# Patient Record
Sex: Female | Born: 1935 | Race: White | Hispanic: No | Smoking: Never smoker
Health system: Southern US, Community
[De-identification: ages and names within clinical notes are randomized; demographics above are authoritative.]

## PROBLEM LIST (undated history)

## (undated) DIAGNOSIS — R55 Syncope and collapse: Secondary | ICD-10-CM

## (undated) DIAGNOSIS — E039 Hypothyroidism, unspecified: Secondary | ICD-10-CM

## (undated) DIAGNOSIS — I471 Supraventricular tachycardia: Secondary | ICD-10-CM

## (undated) DIAGNOSIS — I351 Nonrheumatic aortic (valve) insufficiency: Secondary | ICD-10-CM

## (undated) DIAGNOSIS — I480 Paroxysmal atrial fibrillation: Secondary | ICD-10-CM

## (undated) DIAGNOSIS — I639 Cerebral infarction, unspecified: Secondary | ICD-10-CM

## (undated) DIAGNOSIS — I350 Nonrheumatic aortic (valve) stenosis: Secondary | ICD-10-CM

## (undated) DIAGNOSIS — I998 Other disorder of circulatory system: Secondary | ICD-10-CM

## (undated) DIAGNOSIS — I63441 Cerebral infarction due to embolism of right cerebellar artery: Secondary | ICD-10-CM

## (undated) DIAGNOSIS — I10 Essential (primary) hypertension: Secondary | ICD-10-CM

## (undated) DIAGNOSIS — I749 Embolism and thrombosis of unspecified artery: Secondary | ICD-10-CM

## (undated) DIAGNOSIS — I4719 Other supraventricular tachycardia: Secondary | ICD-10-CM

## (undated) DIAGNOSIS — I484 Atypical atrial flutter: Secondary | ICD-10-CM

## (undated) DIAGNOSIS — R413 Other amnesia: Secondary | ICD-10-CM

## (undated) HISTORY — DX: Atypical atrial flutter: I48.4

## (undated) HISTORY — PX: TOTAL ABDOMINAL HYSTERECTOMY: SHX209

## (undated) HISTORY — DX: Other supraventricular tachycardia: I47.19

## (undated) HISTORY — PX: TONSILLECTOMY: SUR1361

## (undated) HISTORY — DX: Supraventricular tachycardia: I47.1

## (undated) HISTORY — DX: Cerebral infarction, unspecified: I63.9

## (undated) HISTORY — PX: THYROIDECTOMY: SHX17

## (undated) HISTORY — DX: Essential (primary) hypertension: I10

## (undated) HISTORY — PX: OTHER SURGICAL HISTORY: SHX169

## (undated) HISTORY — PX: APPENDECTOMY: SHX54

## (undated) HISTORY — PX: VITRECTOMY AND CATARACT: SHX6184

## (undated) HISTORY — DX: Other amnesia: R41.3

## (undated) HISTORY — DX: Nonrheumatic aortic (valve) stenosis: I35.0

## (undated) HISTORY — PX: BREAST LUMPECTOMY: SHX2

## (undated) HISTORY — DX: Hypothyroidism, unspecified: E03.9

## (undated) HISTORY — DX: Embolism and thrombosis of unspecified artery: I74.9

## (undated) HISTORY — DX: Syncope and collapse: R55

## (undated) HISTORY — DX: Nonrheumatic aortic (valve) insufficiency: I35.1

## (undated) HISTORY — DX: Paroxysmal atrial fibrillation: I48.0

---

## 2006-11-09 ENCOUNTER — Ambulatory Visit: Payer: Self-pay | Admitting: Cardiology

## 2006-11-24 ENCOUNTER — Ambulatory Visit: Payer: Self-pay | Admitting: Cardiology

## 2007-05-10 ENCOUNTER — Ambulatory Visit: Payer: Self-pay | Admitting: Cardiology

## 2007-06-16 ENCOUNTER — Encounter: Payer: Self-pay | Admitting: Cardiology

## 2007-12-15 ENCOUNTER — Ambulatory Visit: Payer: Self-pay | Admitting: Cardiology

## 2008-07-03 ENCOUNTER — Encounter: Payer: Self-pay | Admitting: Cardiology

## 2008-11-09 ENCOUNTER — Ambulatory Visit: Payer: Self-pay | Admitting: Cardiology

## 2009-05-19 DIAGNOSIS — I471 Supraventricular tachycardia, unspecified: Secondary | ICD-10-CM | POA: Insufficient documentation

## 2009-05-19 DIAGNOSIS — I359 Nonrheumatic aortic valve disorder, unspecified: Secondary | ICD-10-CM

## 2009-07-09 ENCOUNTER — Telehealth (INDEPENDENT_AMBULATORY_CARE_PROVIDER_SITE_OTHER): Payer: Self-pay | Admitting: *Deleted

## 2009-08-15 ENCOUNTER — Encounter: Payer: Self-pay | Admitting: Cardiology

## 2009-09-05 ENCOUNTER — Telehealth (INDEPENDENT_AMBULATORY_CARE_PROVIDER_SITE_OTHER): Payer: Self-pay | Admitting: *Deleted

## 2009-09-11 ENCOUNTER — Encounter: Payer: Self-pay | Admitting: Cardiology

## 2009-09-11 DIAGNOSIS — R0602 Shortness of breath: Secondary | ICD-10-CM

## 2009-09-13 ENCOUNTER — Encounter: Payer: Self-pay | Admitting: Cardiology

## 2009-09-13 ENCOUNTER — Ambulatory Visit: Payer: Self-pay | Admitting: Cardiology

## 2009-09-19 ENCOUNTER — Ambulatory Visit: Payer: Self-pay | Admitting: Cardiology

## 2010-03-03 ENCOUNTER — Ambulatory Visit: Payer: Self-pay | Admitting: Cardiology

## 2010-09-02 NOTE — Miscellaneous (Signed)
Summary: med update  Clinical Lists Changes  Medications: Changed medication from VITAMIN E COMPLEX 1000 UNIT CAPS (VITAMIN E) Take 4 tablet by mouth once a day to VITAMIN E 400 UNIT CAPS (VITAMIN E) Take 1 tablet by mouth once a day

## 2010-09-02 NOTE — Assessment & Plan Note (Signed)
Summary: ROV-REQUESTED EARLIER APPT   Visit Type:  Follow-up Primary Provider:  Dr. Kirstie Peri   History of Present Illness: 77 year old woman presents for routine follow-up. Her palpitations have settled down on higher dose beta blocker therapy. She continues to remain active, swimming 5 times a week, and walking on the treadmill for warmup.  Follow-up echocardiogram on 11 February revealed an LVEF of 60%, with mild AS and AR. This is stable, and we reviewed the results.  Recent labs obtained by Dr. Sherryll Burger show a BUN of 15, creatinine 0.7, total cholesterol 215, HDL 54, LDL 130, triglycerides 154, potassium 4.0, TSH 2.3, hemoglobin 15.3.   Preventive Screening-Counseling & Management  Alcohol-Tobacco     Smoking Status: never  Current Medications (verified): 1)  Metoprolol Succinate 25 Mg Xr24h-Tab (Metoprolol Succinate) .... Take One & One-Half  Tablet By Mouth Daily 2)  Levoxyl 100 Mcg Tabs (Levothyroxine Sodium) .... Take 1 Tablet By Mouth Once A Day 3)  Triamterene-Hctz 37.5-25 Mg Tabs (Triamterene-Hctz) .... Take 1 Tablet By Mouth Once A Day 4)  Red Yeast Rice 600 Mg Tabs (Red Yeast Rice Extract) .... Take 1 Tablet By Mouth Two Times A Day 5)  Aspirin 81 Mg Tbec (Aspirin) .... Take One Tablet By Mouth Daily 6)  Multivitamins  Tabs (Multiple Vitamin) .... Take 1 Tablet By Mouth Once A Day 7)  Calcium 600 Mg Tabs (Calcium) .... Take 1 Tablet By Mouth Two Times A Day 8)  Fish Oil 1000 Mg Caps (Omega-3 Fatty Acids) .... Take 1 Capsule By Mouth Once A Day 9)  Vitamin E Complex 1000 Unit Caps (Vitamin E) .... Take 4 Tablet By Mouth Once A Day 10)  Magnesium 250 Mg Tabs (Magnesium) .... Take 1 Tablet By Mouth Once A Day  Allergies (verified): No Known Drug Allergies  Comments:  Nurse/Medical Assistant: The patient's medications and allergies were reviewed with the patient and were updated in the Medication and Allergy Lists. List reviewed.   Past History:  Past Medical  History: Last updated: 09/18/2009 Aortic Insufficiency - mild Aortic Stenosis - mild SVT - paroxysmal  Social History: Last updated: 09/18/2009 Retired  Widowed  Tobacco Use - No Alcohol Use - no Regular Exercise - yes  Review of Systems  The patient denies anorexia, fever, weight loss, chest pain, syncope, dyspnea on exertion, peripheral edema, headaches, melena, and hematochezia.         She has had some mild arthritic knee pain. Otherwise reviewed and negative.  Vital Signs:  Patient profile:   77 year old female Height:      64 inches Weight:      148 pounds Pulse rate:   69 / minute BP sitting:   123 / 89  (left arm) Cuff size:   regular  Vitals Entered By: Carlye Grippe (September 19, 2009 1:04 PM)   Physical Exam  Additional Exam:  Normally nourished appearing elderly woman, in no acute distress. HEENT: Conjunctiva and lids normal, oropharynx clear. Neck: Supple, no carotid bruits, no thyromegaly. Lungs: Clear to auscultation, nonlabored. Cardiac: Regular rate and rhythm, 2/6 systolic murmur heard best at the base, no loud diastolic murmur, no S3. Abdomen: Soft, nontender. Extremities: No pitting edema, distal pulses 2+. Skin: Warm and dry. Musculoskeletal: No significant kyphosis. Neuropsychiatric: Alert and oriented x3, affect appropriate.   EKG  Procedure date:  09/19/2009  Findings:      Normal sinus rhythm at 62 beats per minutes with occasional premature atrial complexes, left anterior fascicular block,  nonspecific ST changes.  Impression & Recommendations:  Problem # 1:  SVT/ PSVT/ PAT (ICD-427.0)  Symptomatically stable following increase in beta blocker dosing toward the end of last year. Patient will continue to be observant for any progressive palpitations that might warrant additional medication adjustments. I will see her back in 6 months.  Her updated medication list for this problem includes:    Metoprolol Succinate 25 Mg Xr24h-tab  (Metoprolol succinate) .Marland Kitchen... Take one & one-half  tablet by mouth daily    Aspirin 81 Mg Tbec (Aspirin) .Marland Kitchen... Take one tablet by mouth daily  Orders: EKG w/ Interpretation (93000)  Problem # 2:  AORTIC STENOSIS/ INSUFFICIENCY, NON-RHEUMATIC (ICD-424.1)  Patient with mild aortic valve stenosis and regurgitation, symptomatically stable, with no significant progression based on recent echocardiogram. Plan to follow the patient clinically at this point.  Her updated medication list for this problem includes:    Metoprolol Succinate 25 Mg Xr24h-tab (Metoprolol succinate) .Marland Kitchen... Take one & one-half  tablet by mouth daily    Triamterene-hctz 37.5-25 Mg Tabs (Triamterene-hctz) .Marland Kitchen... Take 1 tablet by mouth once a day  Patient Instructions: 1)  Your physician recommends that you continue on your current medications as directed. Please refer to the Current Medication list given to you today. 2)  Follow up in  6 months.

## 2010-09-02 NOTE — Progress Notes (Signed)
Summary: PHONE: ? 2 D ECHO TO BE SCHEDULED  Phone Note Call from Patient Call back at [facility] Phone [phone]   Caller: Patient Details for Reason: 2 D ECHO BEFORE OFFICE VISIT Summary of Call: Christine Pugh called regarding an echo to be set up in March. This is a 1 year fu echo. She is coming in to see you before March so need to know if you want her to have the echo before she sees you or wait until after office visit. Initial call taken by: Zachary George,  September 05, 2009 3:30 PM  Follow-up for Phone Call        Would go ahead and get echo so we can review it at her visit. Follow-up by: Loreli Slot, MD, [facility],  September 06, 2009 8:08 AM

## 2010-09-02 NOTE — Assessment & Plan Note (Signed)
Summary: 6 MO FU PER AUG REMINDER   Visit Type:  Follow-up Primary Provider:  Dr. Kirstie Peri   History of Present Illness: 77 year old woman presents for followup. She is doing well. She reports only a brief 15 second episode of palpitations since I last saw her.  Ms. Marcotte showed me her metals and ribbons from recent participation in the national senior games. She swam the back stroke and freestyle.  No exertional chest pain or unusual breathlessness. She seems to be tolerating the present dose of beta blocker therapy. She swims 5 mornings a week, usually 500 yards per session.  I reviewed her most recent echocardiogram.   Preventive Screening-Counseling & Management  Alcohol-Tobacco     Smoking Status: never  Current Medications (verified): 1)  Metoprolol Succinate 25 Mg Xr24h-Tab (Metoprolol Succinate) .... Take One & One-Half  Tablet By Mouth Daily 2)  Levoxyl 100 Mcg Tabs (Levothyroxine Sodium) .... Take 1 Tablet By Mouth Once A Day 3)  Triamterene-Hctz 37.5-25 Mg Tabs (Triamterene-Hctz) .... Take 1 Tablet By Mouth Once A Day 4)  Red Yeast Rice 600 Mg Tabs (Red Yeast Rice Extract) .... Take 1 Tablet By Mouth Two Times A Day 5)  Aspirin 81 Mg Tbec (Aspirin) .... Take One Tablet By Mouth Daily 6)  Multivitamins  Tabs (Multiple Vitamin) .... Take 1 Tablet By Mouth Once A Day 7)  Calcium 600 Mg Tabs (Calcium) .... Take 1 Tablet By Mouth Two Times A Day 8)  Fish Oil 1000 Mg Caps (Omega-3 Fatty Acids) .... Take 1 Capsule By Mouth Once A Day 9)  Vitamin E 400 Unit Caps (Vitamin E) .... Take 1 Tablet By Mouth Once A Day 10)  Magnesium 500 Mg Tabs (Magnesium) .... Take 1 Tablet By Mouth Once A Day  Allergies (verified): No Known Drug Allergies  Comments:  Nurse/Medical Assistant: The patient's medication list and allergies were reviewed with the patient and were updated in the Medication and Allergy Lists.  Past History:  Past Medical History: Last updated:  09/18/2009 Aortic Insufficiency - mild Aortic Stenosis - mild SVT - paroxysmal  Social History: Last updated: 09/18/2009 Retired  Widowed  Tobacco Use - No Alcohol Use - no Regular Exercise - yes  Review of Systems  The patient denies anorexia, fever, chest pain, syncope, dyspnea on exertion, peripheral edema, melena, and hematochezia.         Otherwise reviewed and negative.  Vital Signs:  Patient profile:   77 year old female Height:      64 inches Weight:      148 pounds BMI:     25.50 Pulse rate:   69 / minute BP sitting:   110 / 71  (left arm) Cuff size:   regular  Vitals Entered By: Carlye Grippe (March 03, 2010 1:13 PM)  Physical Exam  Additional Exam:  Normally nourished appearing elderly woman, in no acute distress. HEENT: Conjunctiva and lids normal, oropharynx clear. Neck: Supple, no carotid bruits, no thyromegaly. Lungs: Clear to auscultation, nonlabored. Cardiac: Regular rate and rhythm, 2/6 systolic murmur heard best at the base, no loud diastolic murmur, no S3. Abdomen: Soft, nontender. Extremities: No pitting edema, distal pulses 2+. Skin: Warm and dry. Musculoskeletal: No significant kyphosis. Neuropsychiatric: Alert and oriented x3, affect appropriate.   Echocardiogram  Procedure date:  09/13/2009  Findings:      LVEF 60% with mild LVH, normal right ventricular function, mild aortic regurgitation, calcified aortic valve with very mild aortic stenosis, mild dilatation of ascending aorta  at 44 mmHg. Aortic valve mean gradient 13 mmHg.  Impression & Recommendations:  Problem # 1:  SVT/ PSVT/ PAT (ICD-427.0)  Symptomatically well controlled on present medical regimen. Anticipate an annual followup, sooner if needed.  Her updated medication list for this problem includes:    Metoprolol Succinate 25 Mg Xr24h-tab (Metoprolol succinate) .Marland Kitchen... Take one & one-half  tablet by mouth daily    Aspirin 81 Mg Tbec (Aspirin) .Marland Kitchen... Take one tablet by  mouth daily  Problem # 2:  AORTIC STENOSIS/ INSUFFICIENCY, NON-RHEUMATIC (ICD-424.1)  Asymptomatic, very mild aortic stenosis, and mild aortic regurgitation.  Her updated medication list for this problem includes:    Metoprolol Succinate 25 Mg Xr24h-tab (Metoprolol succinate) .Marland Kitchen... Take one & one-half  tablet by mouth daily    Triamterene-hctz 37.5-25 Mg Tabs (Triamterene-hctz) .Marland Kitchen... Take 1 tablet by mouth once a day  Patient Instructions: 1)  Your physician wants you to follow-up in: 1 year. You will receive a reminder letter in the mail one-two months in advance. If you don't receive a letter, please call our office to schedule the follow-up appointment. 2)  Your physician recommends that you continue on your current medications as directed. Please refer to the Current Medication list given to you today.

## 2010-09-02 NOTE — Medication Information (Signed)
Summary: RX Folder/ MED LIST & OPERATIONS  RX Folder/ MED LIST & OPERATIONS   Imported By: Dorise Hiss 09/20/2009 10:19:08  _____________________________________________________________________  External Attachment:    Type:   Image     Comment:   External Document

## 2010-09-02 NOTE — Miscellaneous (Signed)
Summary: Orders Update  Clinical Lists Changes  Problems: Added new problem of SHORTNESS OF BREATH (ICD-786.05) Orders: Added new Referral order of 2-D Echocardiogram (2D Echo) - Signed

## 2010-12-09 ENCOUNTER — Other Ambulatory Visit: Payer: Self-pay | Admitting: *Deleted

## 2010-12-09 MED ORDER — METOPROLOL SUCCINATE ER 25 MG PO TB24: ORAL_TABLET | ORAL | Status: DC

## 2010-12-16 NOTE — Assessment & Plan Note (Signed)
[facility]                          [facility] NOTE   NAME:Pugh, Christine BLASCO              MRN:          [id number]  DATE:05/10/2007                            DOB:          January 17, 1922    PRIMARY CARE PHYSICIAN:  Dr. Olga Pugh.   REASON FOR VISIT:  Followup of paroxysmal supraventricular tachycardia.   HISTORY OF PRESENT ILLNESS:  I saw Christine Pugh back in April.  She  reports that she has been doing well.  She still swims actively.  In  fact, recently won a Doctor, hospital at the [employer].  She reports  brief palpitations on beta blocker therapy, but nothing prolonged or  inhibiting her life style.  She has had no dizziness or syncope.  I have  reviewed her medications today.   ALLERGIES:  No known drug allergies.   CURRENT MEDICATIONS:  1. Aspirin 81 mg p.o. daily.  2. Calcium with vitamin D 1200 mg p.o. daily.  3. Red yeast rice daily.  4. Fish oil supplements 1000 mg p.o. twice daily.  5. Triamterene/hydrochlorothiazide 37.5 mg/25 mg p.o. daily.  6. Actonel 35 mg weekly.  7. Levothyroxine 125 mcg p.o. daily.  8. Multivitamin daily.  9. Toprol XL 25 mg p.o. daily.   REVIEW OF SYSTEMS:  As described in the present illness.   PHYSICAL EXAMINATION:  VITAL SIGNS:  Blood pressure 119/73, heart rate  79, weight 157 pounds.  GENERAL:  The patient is comfortable and in no acute distress.  HEENT:  Conjunctivae normal.  Pharynx clear.  NECK:  Supple, no elevated jugular venous pressure, no bruits.  LUNGS:  Clear without labored breathing.  HEART:  A regular rate and rhythm.  A 2/6 systolic murmur heard at the  right base.  The second heart sound is preserved.  No S3 gallop.  EXTREMITIES:  No pitting edema.   IMPRESSION:  1. 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  The patient is relatively well-controlled on the      present regimen.  She did not tolerate Diltiazem, due to  lower      extremity edema, although seems to be doing much better on Toprol      XL.  I will plan to see her back over the next six months, and      perhaps cut this back to a yearly followup thereafter, depending      upon how she is doing.  2. Valvular heart disease, including mild aortic stenosis and mild      aortic regurgitation.  The ejection fraction is 60%-65%.     Christine Sidle, MD  Electronically Signed    SGM/MedQ  DD: 05/10/2007  DT: 05/10/2007  Job #: [id number]   cc:   Christine Pugh, M.D.

## 2010-12-16 NOTE — Assessment & Plan Note (Signed)
[facility]                          [facility] NOTE   NAME:Turley, SHANEQUIA KENDRICK              MRN:          [id number]  DATE:12/15/2007                            DOB:          [birth year]    PRIMARY CARE PHYSICIAN:  Dr. Kirstie Peri.   REASON FOR VISIT:  Followup paroxysmal supraventricular tachycardia and  Ms. Kallstrom was seen back in October of last year.  She has had only an  infrequent occurrence of palpitations, nothing prolonged.  She has been  tolerating her low-dose Toprol XL fairly well.  She continues to swim on  a regular basis and recently competed in the senior games.  She is not  having any chest pain.  Her electrocardiogram today shows sinus rhythm  with a left anterior fascicular block which is old.   ALLERGIES:  No known drug allergies.   MEDICATIONS:  1. Aspirin 81 mg p.o. daily.  2. Calcium with vitamin E 1200 mg p.o. daily.  3. Red yeast rice extract.  4. Omega 3 1000 mg p.o. b.i.d.  5. Triamterine/hydrochlorothiazide 37.5/25 mg p.o. daily.  6. Actonel 35 mg p.o. q. week.  7. Levothyroxine 125 mcg p.o. daily.  8. Multivitamin 1 p.o. daily.  9. Toprol XL 25 mg p.o. daily.   REVIEW OF SYSTEMS:  As described in the history of present illness.  Otherwise negative.   EXAMINATION:  Blood pressure today is 116/71, heart rate 75, weight 155  pounds.  The patient is comfortable and in no acute distress.  Examination neck reveals no elevated jugular venous pressure no loud  bruits.  LUNGS:  Clear on breathing rest.  CARDIAC:  Exam reveals a regular rate rhythm with 2/6 systolic murmur at  the base, second heart sounds preserved.  No gallop.  EXTREMITIES:  No pitting edema.   IMPRESSION/RECOMMENDATIONS:  1. Paroxysmal supraventricular tachycardia, likely due to her      reentrant mechanism based on previous assessment.  The patient is      doing well with medical therapy.  Will not make any specific      changes  unless she manifests progressive symptoms.  In that event,      we would refer for further electrophysiology consultation.  2. Mild aortic stenosis and mild a regurgitation, asymptomatic.  3. Followup will be on annual basis at this point.     Jonelle Sidle, MD  Electronically Signed    SGM/MedQ  DD: 12/15/2007  DT: 12/15/2007  Job #: [id number]   cc:   Kirstie Peri, MD

## 2010-12-16 NOTE — Assessment & Plan Note (Signed)
[facility]                          [facility] NOTE   NAME:Christine Pugh              MRN:          [id number]  DATE:11/09/2008                            DOB:          19-Jun-1922    PRIMARY CARE PHYSICIAN:  Christine Peri, MD   REASON FOR VISIT:  Followup paroxysmal supraventricular tachycardia.   HISTORY OF PRESENT ILLNESS:  Christine Pugh is a very pleasant 77 year old  woman with a history of previously documented supraventricular  tachycardia that is likely due to a reentrant mechanism, well-controlled  on beta-blocker therapy.  I last saw her back in May 2009.  She has had  one episode of rapid palpitations since then that lasted perhaps few  minutes, but otherwise no limiting symptoms.  No dizziness and no  syncope.  She continues to swim regularly and compete in the senior  games.  She plans to try and qualify for the National meet next year in  the 200 meters freestyle.  She is not reporting any exertional chest  pain.  She does have a history of mild aortic stenosis and mild aortic  regurgitation that has been largely asymptomatic.   ALLERGIES:  No known drug allergies.   PRESENT MEDICATIONS:  1. Aspirin 81 mg p.o. daily.  2. Calcium with vitamin B12 100 mg p.o. daily.  3. Red yeast rice extract daily.  4. Fish oil supplements 1000 mg p.o. b.i.d.  5. Triamterene and hydrochlorothiazide 37.5/25 mg p.o. daily.  6. Levothyroxine 125 mcg p.o. daily.  7. Multivitamin daily.  8. Toprol-XL 25 mg p.o. daily.   REVIEW OF SYSTEMS:  As outlined above.  It was negative.   PHYSICAL EXAMINATION:  VITAL SIGNS:  Blood pressure is 139/79, heart  rate is 64, weight is 150 pounds, respirations are 18.  GENERAL:  This is a normally nourished appearing elderly woman in no  acute distress.  NECK:  No elevated jugular venous pressure, no loud bruits.  No  thyromegaly.  LUNGS:  Clear.  CARDIAC:  Regular rate and rhythm with a 2-3/6  systolic murmur at the  base.  Second heart sound is preserved.  There is no S3 gallop or  pericardial rub.  EXTREMITIES:  Exhibit no pitting edema.   IMPRESSION AND RECOMMENDATIONS:  1. Paroxysmal supraventricular tachycardia, likely due to a reentrant      mechanism, and well-controlled on present beta-blocker dosing.  No      specific medication changes at this point.  Christine Pugh is not      reporting any functional limitations with her typical swimming      regimen.  We will continue to follow her on annual basis.  2. Known mild aortic stenosis and regurgitation.  A followup      echocardiogram will be obtained prior to her next visit.  Last      study was in April 2008.     Jonelle Sidle, MD  Electronically Signed    SGM/MedQ  DD: 11/09/2008  DT: 11/10/2008  Job #: [id number]   cc:   Christine Peri, MD

## 2010-12-19 NOTE — Assessment & Plan Note (Signed)
[facility]                          [facility]ICE NOTE   NAME:Christine Pugh, Christine Pugh              MRN:          [id number]  DATE:11/24/2006                            DOB:          02/14/1922    PRIMARY CARE PHYSICIAN:  Dr. Atilano Median.   REASON FOR VISIT:  Followup paroxysmal supraventricular tachycardia.   HISTORY OF PRESENT ILLNESS:  I saw Christine Pugh recently in consultation  having presented with rapid palpitations.  She is a pleasant 77 year old  woman with a history of hypertension, hyperlipidemia, and  hypothyroidism.  She is very active and swims on a regular basis.  She  presented with a narrow complex supraventricular tachycardia that is  suggestive of either an AV nodal re-entrant tachycardia or AV re-entrant  tachycardia that broke with adenosine.  I placed her on long acting  channel blocker therapy at that time and she underwent an echocardiogram  demonstrating normal left ventricular systolic function with an ejection  fraction of 60% - 65%.  She did have mild mitral annular calcification  and mild calcific aortic stenosis with mild aortic regurgitation.  She  had a followup outpatient adenosine Myoview which indicated no clear  evidence of ischemia.  I reviewed these results with her today.  She has  returned to swimming and doing her usual activities without any  problems.  She has had no breakthrough palpitations of any significance  since that time.  Her electrocardiogram today shows sinus rhythm with a  single premature ventricular complex, intervals are normal.  She has a  small R-prime in lead V1 and V2 which was noted previously.  Today we  talked about continuing to remain active, avoiding excess caffeine, and  avoiding over the counter cold preparations with pseudoephedrine.  Otherwise we will plan basic observational medical therapy.   ALLERGIES:  NO KNOWN DRUG ALLERGIES.   PRESENT MEDICATIONS:  1. Aspirin  81 mg p.o. daily.  2. Calcium with vitamin D 1200 mg p.o. daily.  3. Red yeast rice extract.  4. Fish oil supplements 1000 mg p.o. b.i.d.  5. Triamterine hydrochlorothiazide 37.5/25 mg p.o. daily.  6. Actonel 35 mg p.o. q. week.  7. Diltiazem ER 120 mg p.o. daily.  8. Levothyroxine 125 mcg p.o. daily.   REVIEW OF SYSTEMS:  As described in the history of present illness.   EXAMINATION:  Blood pressure is 116/76, heart rate of 73, weight is 159  pounds.  The patient is comfortable and in no acute distress.  NECK:  Reveals no elevated jugular venous pressure, loud bruits.  LUNGS:  Clear without labored breathing.  CARDIAC:  Reveals a regular rate and rhythm without rub, murmur, or  gallop.  EXTREMITIES:  Show no significant pitting edema.   IMPRESSION:  1. Paroxysmal supraventricular tachycardia, likely either AV nodal re-      entrant tachycardia or AV re-entrant tachycardia.  No associated      syncope and followup workup indicates normal thyroid stimulating      hormone, normal left ventricular systolic function, and no marked      ischemia.  I have recommended that she continue her regular  exercise regimen and also continue with Cardizem CD, she is      tolerating this well.  If she has significant breakthrough we can      certainly consider an electrophysiology referral.  She was      comfortable with this.  I will plan to see her back for symptom      review in 6 months.  2. Otherwise continue followup with Dr. Celine Mans.     Jonelle Sidle, MD  Electronically Signed    SGM/MedQ  DD: 11/24/2006  DT: 11/24/2006  Job #: [id number]   cc:   Atilano Median, MD

## 2011-01-13 ENCOUNTER — Encounter: Payer: Self-pay | Admitting: Cardiology

## 2011-02-02 ENCOUNTER — Encounter: Payer: Self-pay | Admitting: Cardiology

## 2011-02-02 ENCOUNTER — Ambulatory Visit (INDEPENDENT_AMBULATORY_CARE_PROVIDER_SITE_OTHER): Payer: Medicare Other | Admitting: Cardiology

## 2011-02-02 VITALS — BP 101/64 | HR 74 | Ht 64.0 in | Wt 146.0 lb

## 2011-02-02 DIAGNOSIS — I471 Supraventricular tachycardia: Secondary | ICD-10-CM

## 2011-02-02 DIAGNOSIS — I359 Nonrheumatic aortic valve disorder, unspecified: Secondary | ICD-10-CM

## 2011-02-02 MED ORDER — METOPROLOL SUCCINATE ER 25 MG PO TB24: ORAL_TABLET | ORAL | Status: DC

## 2011-02-02 NOTE — Assessment & Plan Note (Signed)
Heart murmur is stable on examination, no new symptoms. Echocardiogram from February 2011 was reviewed.

## 2011-02-02 NOTE — Assessment & Plan Note (Signed)
Continue present medical therapy. ECG reviewed.

## 2011-02-02 NOTE — Patient Instructions (Signed)
   Toprol XL refill sent to [facility] for 90 day supply Your physician wants you to follow up in: 6 months.  You will receive a reminder letter in the mail one-two months in advance.  If you don't receive a letter, please call our office to schedule the follow up appointment

## 2011-02-02 NOTE — Progress Notes (Signed)
Clinical Summary Christine Pugh is a 77 y.o.female presenting for followup. She continues to swim regularly, 500 yards this morning. Competes in the senior games as before. She reports no new shortness of breath, exertional chest pain.  I reviewed her last echocardiogram from February 2011 showing mild aortic valve stenosis and mild aortic regurgitation, normal LVEF.  She continues to report brief episodes of palpitations, likely her PSVT. She has had more frequent events in the last few months, particularly in light of the recent passing of her younger sister who was in her mid 75s. This was fairly sudden, and she is still grieving.  We discussed her exercise and diet. She states that she continues to have full lab work with Dr. Sherryll Burger.   No Known Allergies  Current outpatient prescriptions:aspirin 81 MG tablet, Take 81 mg by mouth daily.  , Disp: , Rfl: ;  calcium carbonate (OS-CAL) 600 MG TABS, Take 600 mg by mouth 2 (two) times daily with a meal.  , Disp: , Rfl: ;  levothyroxine (SYNTHROID, LEVOTHROID) 100 MCG tablet, Take 100 mcg by mouth daily.  , Disp: , Rfl: ;  Magnesium 500 MG TABS, Take 1 tablet by mouth daily.  , Disp: , Rfl:  metoprolol succinate (TOPROL XL) 25 MG 24 hr tablet, Take 1&1/2 by mouth daily , Disp: 105 tablet, Rfl: 6;  Multiple Vitamin (MULTIVITAMIN) tablet, Take 1 tablet by mouth daily.  , Disp: , Rfl: ;  Multiple Vitamins-Minerals (PRESERVISION/LUTEIN PO), Take 1 tablet by mouth 2 (two) times daily.  , Disp: , Rfl: ;  Omega-3 Fatty Acids (FISH OIL) 1000 MG CAPS, Take 1 capsule by mouth daily.  , Disp: , Rfl:  Red Yeast Rice 600 MG TABS, Take 1 tablet by mouth 2 (two) times daily.  , Disp: , Rfl: ;  triamterene-hydrochlorothiazide (DYAZIDE) 37.5-25 MG per capsule, Take 1 capsule by mouth daily.  , Disp: , Rfl: ;  vitamin E 400 UNIT capsule, Take 400 Units by mouth daily.  , Disp: , Rfl:   Past Medical History  Diagnosis Date  . Aortic insufficiency     Mild  . Aortic  stenosis     Mild  . PSVT (paroxysmal supraventricular tachycardia)     Social History Ms. Yung reports that she has never smoked. She has never used smokeless tobacco. Ms. Higley reports that she does not drink alcohol.  Review of Systems Otherwise negative.  Physical Examination Filed Vitals:   02/02/11 0935  BP: 101/64  Pulse: 74  Normally nourished appearing elderly woman, in no acute distress.  HEENT: Conjunctiva and lids normal, oropharynx clear.  Neck: Supple, no carotid bruits, no thyromegaly.  Lungs: Clear to auscultation, nonlabored.  Cardiac: Regular rate and rhythm, 2/6 systolic murmur heard best at the base, no loud diastolic murmur, no S3.  Abdomen: Soft, nontender.  Extremities: No pitting edema, distal pulses 2+.  Skin: Warm and dry.  Musculoskeletal: No significant kyphosis.  Neuropsychiatric: Alert and oriented x3, affect appropriate.    ECG Sinus rhythm at 86 with single PAC, left anterior fascicular block.    Problem List and Plan

## 2011-08-06 ENCOUNTER — Ambulatory Visit (INDEPENDENT_AMBULATORY_CARE_PROVIDER_SITE_OTHER): Payer: Medicare Other | Admitting: Cardiology

## 2011-08-06 ENCOUNTER — Encounter: Payer: Self-pay | Admitting: Cardiology

## 2011-08-06 VITALS — BP 127/80 | HR 67 | Ht 64.0 in | Wt 147.0 lb

## 2011-08-06 DIAGNOSIS — I359 Nonrheumatic aortic valve disorder, unspecified: Secondary | ICD-10-CM

## 2011-08-06 DIAGNOSIS — I471 Supraventricular tachycardia: Secondary | ICD-10-CM

## 2011-08-06 MED ORDER — METOPROLOL SUCCINATE ER 25 MG PO TB24: 37.5000 mg | ORAL_TABLET | Freq: Every day | ORAL | Status: DC

## 2011-08-06 NOTE — Assessment & Plan Note (Signed)
Murmur is stable. No change in functional capacity. Followup echocardiogram for her next visit.

## 2011-08-06 NOTE — Assessment & Plan Note (Signed)
Fewer palpitations compared to last visit. Continue same medications and observation for now.

## 2011-08-06 NOTE — Patient Instructions (Signed)
Your physician wants you to follow-up in: 6 months. You will receive a reminder letter in the mail one-two months in advance. If you don't receive a letter, please call our office to schedule the follow-up appointment. Your physician recommends that you continue on your current medications as directed. Please refer to the Current Medication list given to you today. Your physician has requested that you have an echocardiogram. Echocardiography is a painless test that uses sound waves to create images of your heart. It provides your doctor with information about the size and shape of your heart and how well your heart's chambers and valves are working. This procedure takes approximately one hour. There are no restrictions for this procedure. DO ECHO IN 6 MONTHS BEFORE NEXT OFFICE VISIT. 

## 2011-08-06 NOTE — Progress Notes (Signed)
Clinical Summary Christine Pugh is a 78 y.o.female presenting for followup. She was seen in July.  Continues to do well without chest pain or breathlessness. Reports fewer palpitations than at the last visit. She is still swimming regularly at the pool.  Her ECG is reviewed showing sinus rhythm with PACs.  No changes in her medications. She needs a refill on Toprol XL.   No Known Allergies  Medication list reviewed.  Past Medical History  Diagnosis Date  . Aortic insufficiency     Mild  . Aortic stenosis     Mild  . PSVT (paroxysmal supraventricular tachycardia)     Past Surgical History  Procedure Date  . Thyroidectomy   . Total abdominal hysterectomy   . Appendectomy   . Tonsillectomy     Family History  Problem Relation Age of Onset  . Coronary artery disease      Social History Christine Pugh reports that she has never smoked. She has never used smokeless tobacco. Christine Pugh reports that she does not drink alcohol.  Review of Systems Reviewed above. Otherwise negative.  Physical Examination Filed Vitals:   08/06/11 1258  BP: 127/80  Pulse: 67    Normally nourished appearing elderly woman, in no acute distress.  HEENT: Conjunctiva and lids normal, oropharynx clear.  Neck: Supple, no carotid bruits, no thyromegaly.  Lungs: Clear to auscultation, nonlabored.  Cardiac: Regular rate and rhythm with ectopy, 2-3/6 systolic murmur heard best at the base, no loud diastolic murmur, no S3.  Abdomen: Soft, nontender.  Extremities: No pitting edema, distal pulses 2+.    ECG Reviewed in EMR.   Problem List and Plan

## 2011-08-10 DIAGNOSIS — H26499 Other secondary cataract, unspecified eye: Secondary | ICD-10-CM | POA: Diagnosis not present

## 2011-08-10 DIAGNOSIS — H35359 Cystoid macular degeneration, unspecified eye: Secondary | ICD-10-CM | POA: Diagnosis not present

## 2011-08-10 DIAGNOSIS — H353190 Nonexudative age-related macular degeneration, unspecified eye, stage unspecified: Secondary | ICD-10-CM | POA: Diagnosis not present

## 2011-08-10 DIAGNOSIS — H43819 Vitreous degeneration, unspecified eye: Secondary | ICD-10-CM | POA: Diagnosis not present

## 2011-08-24 DIAGNOSIS — Z1231 Encounter for screening mammogram for malignant neoplasm of breast: Secondary | ICD-10-CM | POA: Diagnosis not present

## 2011-09-09 DIAGNOSIS — H35059 Retinal neovascularization, unspecified, unspecified eye: Secondary | ICD-10-CM | POA: Diagnosis not present

## 2011-09-09 DIAGNOSIS — H353290 Exudative age-related macular degeneration, unspecified eye, stage unspecified: Secondary | ICD-10-CM | POA: Diagnosis not present

## 2011-09-21 DIAGNOSIS — L821 Other seborrheic keratosis: Secondary | ICD-10-CM | POA: Diagnosis not present

## 2011-09-21 DIAGNOSIS — D239 Other benign neoplasm of skin, unspecified: Secondary | ICD-10-CM | POA: Diagnosis not present

## 2011-10-13 DIAGNOSIS — E7800 Pure hypercholesterolemia, unspecified: Secondary | ICD-10-CM | POA: Diagnosis not present

## 2011-10-13 DIAGNOSIS — Z79899 Other long term (current) drug therapy: Secondary | ICD-10-CM | POA: Diagnosis not present

## 2011-10-13 DIAGNOSIS — R5381 Other malaise: Secondary | ICD-10-CM | POA: Diagnosis not present

## 2011-10-13 DIAGNOSIS — E559 Vitamin D deficiency, unspecified: Secondary | ICD-10-CM | POA: Diagnosis not present

## 2011-10-13 DIAGNOSIS — I10 Essential (primary) hypertension: Secondary | ICD-10-CM | POA: Diagnosis not present

## 2011-10-19 DIAGNOSIS — Z0000 Encounter for general adult medical examination without abnormal findings: Secondary | ICD-10-CM | POA: Diagnosis not present

## 2011-10-19 DIAGNOSIS — Z1212 Encounter for screening for malignant neoplasm of rectum: Secondary | ICD-10-CM | POA: Diagnosis not present

## 2011-10-21 DIAGNOSIS — H353290 Exudative age-related macular degeneration, unspecified eye, stage unspecified: Secondary | ICD-10-CM | POA: Diagnosis not present

## 2011-10-21 DIAGNOSIS — H35059 Retinal neovascularization, unspecified, unspecified eye: Secondary | ICD-10-CM | POA: Diagnosis not present

## 2011-11-25 DIAGNOSIS — L0201 Cutaneous abscess of face: Secondary | ICD-10-CM | POA: Diagnosis not present

## 2011-12-02 DIAGNOSIS — H35059 Retinal neovascularization, unspecified, unspecified eye: Secondary | ICD-10-CM | POA: Diagnosis not present

## 2011-12-02 DIAGNOSIS — H353290 Exudative age-related macular degeneration, unspecified eye, stage unspecified: Secondary | ICD-10-CM | POA: Diagnosis not present

## 2012-01-25 DIAGNOSIS — H35059 Retinal neovascularization, unspecified, unspecified eye: Secondary | ICD-10-CM | POA: Diagnosis not present

## 2012-01-25 DIAGNOSIS — H353290 Exudative age-related macular degeneration, unspecified eye, stage unspecified: Secondary | ICD-10-CM | POA: Diagnosis not present

## 2012-02-02 DIAGNOSIS — L03119 Cellulitis of unspecified part of limb: Secondary | ICD-10-CM | POA: Diagnosis not present

## 2012-02-02 DIAGNOSIS — L02419 Cutaneous abscess of limb, unspecified: Secondary | ICD-10-CM | POA: Diagnosis not present

## 2012-02-05 DIAGNOSIS — L02419 Cutaneous abscess of limb, unspecified: Secondary | ICD-10-CM | POA: Diagnosis not present

## 2012-02-05 DIAGNOSIS — R609 Edema, unspecified: Secondary | ICD-10-CM | POA: Diagnosis not present

## 2012-02-08 DIAGNOSIS — L02419 Cutaneous abscess of limb, unspecified: Secondary | ICD-10-CM | POA: Diagnosis not present

## 2012-02-08 DIAGNOSIS — L03119 Cellulitis of unspecified part of limb: Secondary | ICD-10-CM | POA: Diagnosis not present

## 2012-02-11 DIAGNOSIS — L97809 Non-pressure chronic ulcer of other part of unspecified lower leg with unspecified severity: Secondary | ICD-10-CM | POA: Diagnosis not present

## 2012-02-11 DIAGNOSIS — Z7982 Long term (current) use of aspirin: Secondary | ICD-10-CM | POA: Diagnosis not present

## 2012-02-11 DIAGNOSIS — Z79899 Other long term (current) drug therapy: Secondary | ICD-10-CM | POA: Diagnosis not present

## 2012-02-11 DIAGNOSIS — I872 Venous insufficiency (chronic) (peripheral): Secondary | ICD-10-CM | POA: Diagnosis not present

## 2012-02-11 DIAGNOSIS — S8010XA Contusion of unspecified lower leg, initial encounter: Secondary | ICD-10-CM | POA: Diagnosis not present

## 2012-02-12 DIAGNOSIS — S81809A Unspecified open wound, unspecified lower leg, initial encounter: Secondary | ICD-10-CM | POA: Diagnosis not present

## 2012-02-12 DIAGNOSIS — M79609 Pain in unspecified limb: Secondary | ICD-10-CM | POA: Diagnosis not present

## 2012-02-12 DIAGNOSIS — I10 Essential (primary) hypertension: Secondary | ICD-10-CM | POA: Diagnosis not present

## 2012-02-12 DIAGNOSIS — M7989 Other specified soft tissue disorders: Secondary | ICD-10-CM | POA: Diagnosis not present

## 2012-02-15 DIAGNOSIS — L97809 Non-pressure chronic ulcer of other part of unspecified lower leg with unspecified severity: Secondary | ICD-10-CM | POA: Diagnosis not present

## 2012-02-15 DIAGNOSIS — I872 Venous insufficiency (chronic) (peripheral): Secondary | ICD-10-CM | POA: Diagnosis not present

## 2012-02-15 DIAGNOSIS — Z7982 Long term (current) use of aspirin: Secondary | ICD-10-CM | POA: Diagnosis not present

## 2012-02-15 DIAGNOSIS — S8010XA Contusion of unspecified lower leg, initial encounter: Secondary | ICD-10-CM | POA: Diagnosis not present

## 2012-02-15 DIAGNOSIS — Z79899 Other long term (current) drug therapy: Secondary | ICD-10-CM | POA: Diagnosis not present

## 2012-02-25 ENCOUNTER — Other Ambulatory Visit: Payer: Self-pay | Admitting: Cardiology

## 2012-02-25 ENCOUNTER — Telehealth: Payer: Self-pay | Admitting: Cardiology

## 2012-02-25 DIAGNOSIS — I359 Nonrheumatic aortic valve disorder, unspecified: Secondary | ICD-10-CM

## 2012-02-25 DIAGNOSIS — I471 Supraventricular tachycardia: Secondary | ICD-10-CM

## 2012-02-25 NOTE — Telephone Encounter (Signed)
Patient call concerned about Rapid heart beats that has made her dizzy. Can tell that her heart is skipping beats.  She is currently in [place] until 7/28 night the will be going back out of town 8/18.  I explained that [facility] first available is 8/20 with [place].  She wanted to know if she could be seen in [place] sooner? She would like her stress text completed and see Diona Browner before then.

## 2012-02-25 NOTE — Telephone Encounter (Signed)
Patient informed that no available appointments for [facility] b/4 she leaves for vacation again. Patient said she would see Gene. Nurse informed her that her echo could be scheduled before that time.

## 2012-02-29 DIAGNOSIS — I83219 Varicose veins of right lower extremity with both ulcer of unspecified site and inflammation: Secondary | ICD-10-CM | POA: Diagnosis not present

## 2012-02-29 DIAGNOSIS — L97809 Non-pressure chronic ulcer of other part of unspecified lower leg with unspecified severity: Secondary | ICD-10-CM | POA: Diagnosis not present

## 2012-02-29 DIAGNOSIS — S8010XA Contusion of unspecified lower leg, initial encounter: Secondary | ICD-10-CM | POA: Diagnosis not present

## 2012-02-29 DIAGNOSIS — Z79899 Other long term (current) drug therapy: Secondary | ICD-10-CM | POA: Diagnosis not present

## 2012-02-29 DIAGNOSIS — L97919 Non-pressure chronic ulcer of unspecified part of right lower leg with unspecified severity: Secondary | ICD-10-CM | POA: Diagnosis not present

## 2012-02-29 DIAGNOSIS — I872 Venous insufficiency (chronic) (peripheral): Secondary | ICD-10-CM | POA: Diagnosis not present

## 2012-02-29 DIAGNOSIS — Z7982 Long term (current) use of aspirin: Secondary | ICD-10-CM | POA: Diagnosis not present

## 2012-03-08 DIAGNOSIS — IMO0001 Reserved for inherently not codable concepts without codable children: Secondary | ICD-10-CM | POA: Diagnosis not present

## 2012-03-08 DIAGNOSIS — I83219 Varicose veins of right lower extremity with both ulcer of unspecified site and inflammation: Secondary | ICD-10-CM | POA: Diagnosis not present

## 2012-03-08 DIAGNOSIS — I872 Venous insufficiency (chronic) (peripheral): Secondary | ICD-10-CM | POA: Diagnosis not present

## 2012-03-08 DIAGNOSIS — L97809 Non-pressure chronic ulcer of other part of unspecified lower leg with unspecified severity: Secondary | ICD-10-CM | POA: Diagnosis not present

## 2012-03-10 ENCOUNTER — Other Ambulatory Visit: Payer: Self-pay

## 2012-03-10 ENCOUNTER — Other Ambulatory Visit (INDEPENDENT_AMBULATORY_CARE_PROVIDER_SITE_OTHER): Payer: Medicare Other

## 2012-03-10 DIAGNOSIS — I471 Supraventricular tachycardia: Secondary | ICD-10-CM | POA: Diagnosis not present

## 2012-03-10 DIAGNOSIS — I359 Nonrheumatic aortic valve disorder, unspecified: Secondary | ICD-10-CM

## 2012-03-15 DIAGNOSIS — S8010XA Contusion of unspecified lower leg, initial encounter: Secondary | ICD-10-CM | POA: Diagnosis not present

## 2012-03-15 DIAGNOSIS — I872 Venous insufficiency (chronic) (peripheral): Secondary | ICD-10-CM | POA: Diagnosis not present

## 2012-03-15 DIAGNOSIS — IMO0001 Reserved for inherently not codable concepts without codable children: Secondary | ICD-10-CM | POA: Diagnosis not present

## 2012-03-15 DIAGNOSIS — L97809 Non-pressure chronic ulcer of other part of unspecified lower leg with unspecified severity: Secondary | ICD-10-CM | POA: Diagnosis not present

## 2012-03-15 DIAGNOSIS — L97909 Non-pressure chronic ulcer of unspecified part of unspecified lower leg with unspecified severity: Secondary | ICD-10-CM | POA: Diagnosis not present

## 2012-03-15 DIAGNOSIS — I83009 Varicose veins of unspecified lower extremity with ulcer of unspecified site: Secondary | ICD-10-CM | POA: Diagnosis not present

## 2012-03-16 ENCOUNTER — Encounter: Payer: Self-pay | Admitting: Physician Assistant

## 2012-03-16 ENCOUNTER — Ambulatory Visit (INDEPENDENT_AMBULATORY_CARE_PROVIDER_SITE_OTHER): Payer: Medicare Other | Admitting: Physician Assistant

## 2012-03-16 VITALS — BP 120/68 | HR 69 | Ht 65.0 in | Wt 145.4 lb

## 2012-03-16 DIAGNOSIS — I471 Supraventricular tachycardia, unspecified: Secondary | ICD-10-CM | POA: Diagnosis not present

## 2012-03-16 DIAGNOSIS — R002 Palpitations: Secondary | ICD-10-CM

## 2012-03-16 DIAGNOSIS — I359 Nonrheumatic aortic valve disorder, unspecified: Secondary | ICD-10-CM

## 2012-03-16 DIAGNOSIS — I498 Other specified cardiac arrhythmias: Secondary | ICD-10-CM

## 2012-03-16 MED ORDER — METOPROLOL SUCCINATE ER 25 MG PO TB24: 25.0000 mg | ORAL_TABLET | Freq: Two times a day (BID) | ORAL | Status: DC

## 2012-03-16 NOTE — Assessment & Plan Note (Signed)
Will increase Toprol to 25 twice a day, for better suppression of recent breakthrough symptomatic tachycardia palpitations. We'll reassess clinical status in one month. If symptoms worsen, would consider further evaluation with an event monitor.

## 2012-03-16 NOTE — Assessment & Plan Note (Addendum)
Assessed as moderate, by recent followup echo; normal LVF. Patient remains asymptomatic. Recommend repeat study in 6 months. 

## 2012-03-16 NOTE — Progress Notes (Signed)
Primary Cardiologist: Simona Huh, MD   HPI: Patient seen today as an add-on for evaluation of symptomatic palpitations.   Recent followup 2-D echo, reviewed by Dr. Diona Browner, indicated NL LVEF; now with overall overall moderate aortic stenosis.  She presents reporting somewhat increased frequency of palpitations over the last few months. These are unpredictable in onset, and last only a few seconds to perhaps 1 minute at most in duration. Nevertheless, she does not tolerate these episodes very well. She is "startled" by them, with some mild weakness and headache that follows. She denies any near-syncope/syncope, CP, or significant dyspnea.  Patient otherwise continues to do quite well. Despite a recent mechanical fall (no syncope), she continues to swim 5 times weekly. She is also preparing for white water rafting on the [place] for 4 days.  12-lead EKG today, reviewed by me, reveals NSR with isolated PVC  No Known Allergies  Current Outpatient Prescriptions  Medication Sig Dispense Refill  . aspirin 81 MG tablet Take 81 mg by mouth daily.        . calcium carbonate (OS-CAL) 600 MG TABS Take 600 mg by mouth daily.       Marland Kitchen levothyroxine (SYNTHROID, LEVOTHROID) 100 MCG tablet Take 100 mcg by mouth daily.        . Magnesium 500 MG TABS Take 1 tablet by mouth daily.        . metoprolol succinate (TOPROL XL) 25 MG 24 hr tablet Take 1.5 tablets (37.5 mg total) by mouth daily.  135 tablet  3  . Multiple Vitamin (MULTIVITAMIN) tablet Take 1 tablet by mouth daily.        . Multiple Vitamins-Minerals (PRESERVISION/LUTEIN PO) Take 1 tablet by mouth 2 (two) times daily.        Marland Kitchen ofloxacin (OCUFLOX) 0.3 % ophthalmic solution Place 3 drops into the left eye 3 (three) times daily. Take 3 drops to left eye for 3 days after injection.      . Omega-3 Fatty Acids (FISH OIL) 1000 MG CAPS Take 1 capsule by mouth daily.        . Red Yeast Rice 600 MG TABS Take 1 tablet by mouth 2 (two) times daily.         Marland Kitchen triamterene-hydrochlorothiazide (DYAZIDE) 37.5-25 MG per capsule Take 1 capsule by mouth daily.        . vitamin E 400 UNIT capsule Take 400 Units by mouth daily.          Past Medical History  Diagnosis Date  . Aortic insufficiency     Mild  . Aortic stenosis     Mild  . PSVT (paroxysmal supraventricular tachycardia)     Past Surgical History  Procedure Date  . Thyroidectomy   . Total abdominal hysterectomy   . Appendectomy   . Tonsillectomy     History   Social History  . Marital Status: Widowed    Spouse Name: N/A    Number of Children: N/A  . Years of Education: N/A   Occupational History  . Retired    Social History Main Topics  . Smoking status: Never Smoker   . Smokeless tobacco: Never Used  . Alcohol Use: No  . Drug Use: No  . Sexually Active: Not on file   Other Topics Concern  . Not on file   Social History Narrative  . No narrative on file   Social History Narrative  . No narrative on file    Problem Relation Age of Onset  .  Coronary artery disease      ROS: no nausea, vomiting; no fever, chills; no melena, hematochezia; no claudication  PHYSICAL EXAM: BP 120/68  Pulse 69  Ht 5\' 5"  (1.651 m)  Wt 145 lb 6.4 oz (65.953 kg)  BMI 24.20 kg/m2 GENERAL: 78 year-old female; NAD HEENT: NCAT, PERRLA, EOMI; sclera clear; no xanthelasma NECK: palpable bilateral carotid pulses, no bruits; no JVD; no TM LUNGS: CTA bilaterally CARDIAC: RRR ; harsh, 3/6 crescendo decrescendo murmur, crisp S2; no rubs or gallops ABDOMEN: soft, non-tender; intact BS EXTREMETIES: bandaged RLE no significant peripheral edema SKIN: warm/dry; no obvious rash/lesions MUSCULOSKELETAL: no joint deformity NEURO: no focal deficit; NL affect   EKG: reviewed and available in Electronic Records   ASSESSMENT & PLAN:  SVT/ PSVT/ PAT Will increase Toprol to 25 twice a day, for better suppression of recent breakthrough symptomatic tachycardia palpitations. We'll reassess  clinical status in one month. If symptoms worsen, would consider further evaluation with an event monitor.  AORTIC STENOSIS/ INSUFFICIENCY, NON-RHEUMATIC Assessed as moderate, by recent followup echo; normal LVF. Patient remains asymptomatic. Recommend repeat study in 6 months.    Gene Lutie Pickler, PAC

## 2012-03-16 NOTE — Addendum Note (Signed)
Addended by: Eustace Moore on: 03/16/2012 02:23 PM   Modules accepted: Orders, Level of Service

## 2012-03-16 NOTE — Patient Instructions (Addendum)
Your physician recommends that you schedule a follow-up appointment in: 1 month. Your physician has recommended you make the following change in your medication: Increase Toprol Xl 25 mg to one by mouth twice daily. All other medications are still the same. Your new prescription has been sent to your pharmacy. Your physician recommends that you return for lab work in: today for BMET and Magnesium level at [facility]

## 2012-03-18 ENCOUNTER — Telehealth: Payer: Self-pay | Admitting: *Deleted

## 2012-03-18 NOTE — Telephone Encounter (Signed)
Message copied by Eustace Moore on Fri Mar 18, 2012 11:30 AM ------      Message from: Rande Brunt      Created: Fri Mar 18, 2012  8:19 AM       NL Mg level

## 2012-03-18 NOTE — Telephone Encounter (Signed)
Patient informed via message machine voicemail.

## 2012-03-22 DIAGNOSIS — I872 Venous insufficiency (chronic) (peripheral): Secondary | ICD-10-CM | POA: Diagnosis not present

## 2012-03-22 DIAGNOSIS — IMO0001 Reserved for inherently not codable concepts without codable children: Secondary | ICD-10-CM | POA: Diagnosis not present

## 2012-03-22 DIAGNOSIS — L97809 Non-pressure chronic ulcer of other part of unspecified lower leg with unspecified severity: Secondary | ICD-10-CM | POA: Diagnosis not present

## 2012-03-22 DIAGNOSIS — L97909 Non-pressure chronic ulcer of unspecified part of unspecified lower leg with unspecified severity: Secondary | ICD-10-CM | POA: Diagnosis not present

## 2012-03-31 DIAGNOSIS — Z7982 Long term (current) use of aspirin: Secondary | ICD-10-CM | POA: Diagnosis not present

## 2012-03-31 DIAGNOSIS — L259 Unspecified contact dermatitis, unspecified cause: Secondary | ICD-10-CM | POA: Diagnosis not present

## 2012-03-31 DIAGNOSIS — Z79899 Other long term (current) drug therapy: Secondary | ICD-10-CM | POA: Diagnosis not present

## 2012-04-06 DIAGNOSIS — H353290 Exudative age-related macular degeneration, unspecified eye, stage unspecified: Secondary | ICD-10-CM | POA: Diagnosis not present

## 2012-04-06 DIAGNOSIS — H35059 Retinal neovascularization, unspecified, unspecified eye: Secondary | ICD-10-CM | POA: Diagnosis not present

## 2012-04-12 ENCOUNTER — Encounter: Payer: Self-pay | Admitting: Cardiology

## 2012-04-12 ENCOUNTER — Ambulatory Visit (INDEPENDENT_AMBULATORY_CARE_PROVIDER_SITE_OTHER): Payer: Medicare Other | Admitting: Cardiology

## 2012-04-12 VITALS — BP 128/77 | HR 66 | Ht 65.0 in | Wt 146.8 lb

## 2012-04-12 DIAGNOSIS — I471 Supraventricular tachycardia, unspecified: Secondary | ICD-10-CM

## 2012-04-12 DIAGNOSIS — I359 Nonrheumatic aortic valve disorder, unspecified: Secondary | ICD-10-CM | POA: Diagnosis not present

## 2012-04-12 NOTE — Patient Instructions (Addendum)

## 2012-04-12 NOTE — Progress Notes (Signed)
   Clinical Summary Ms. Mccahill is a 78 y.o.female presenting for followup. She was seen recently by Mr. Serpe in August secondary to increasing sense of palpitations. Beta blocker dose was advanced, and fortunately she has tolerated this well with significant improvement in symptoms. She is also able to remain active, swimming regularly. Recently traveled to the [facility] for a grafting and camping trip.   No Known Allergies  Current Outpatient Prescriptions  Medication Sig Dispense Refill  . aspirin 81 MG tablet Take 81 mg by mouth daily.        . calcium carbonate (OS-CAL) 600 MG TABS Take 600 mg by mouth daily.       Marland Kitchen levothyroxine (SYNTHROID, LEVOTHROID) 100 MCG tablet Take 100 mcg by mouth daily.        . Magnesium 500 MG TABS Take 1 tablet by mouth daily.        . metoprolol succinate (TOPROL-XL) 25 MG 24 hr tablet Take 1 tablet (25 mg total) by mouth 2 (two) times daily.  180 tablet  3  . Multiple Vitamin (MULTIVITAMIN) tablet Take 1 tablet by mouth daily.        . Multiple Vitamins-Minerals (PRESERVISION/LUTEIN PO) Take 1 tablet by mouth 2 (two) times daily.        Marland Kitchen ofloxacin (OCUFLOX) 0.3 % ophthalmic solution Place 3 drops into the left eye 3 (three) times daily. Take 3 drops to left eye for 3 days after injection.      . Omega-3 Fatty Acids (FISH OIL) 1000 MG CAPS Take 1 capsule by mouth daily.        . Red Yeast Rice 600 MG TABS Take 1 tablet by mouth 2 (two) times daily.        Marland Kitchen triamterene-hydrochlorothiazide (DYAZIDE) 37.5-25 MG per capsule Take 1 capsule by mouth daily.        . vitamin E 400 UNIT capsule Take 400 Units by mouth daily.          Past Medical History  Diagnosis Date  . Aortic insufficiency     Mild  . Aortic stenosis     Mild  . PSVT (paroxysmal supraventricular tachycardia)     Social History Ms. Sanagustin reports that she has never smoked. She has never used smokeless tobacco. Ms. Mezey reports that she does not drink alcohol.  Review of  Systems No syncope or dizziness. Stable appetite. Otherwise negative.  Physical Examination Filed Vitals:   04/12/12 1421  BP: 128/77  Pulse: 66    Normally nourished appearing elderly woman, in no acute distress.  HEENT: Conjunctiva and lids normal, oropharynx clear.  Neck: Supple, no carotid bruits, no thyromegaly.  Lungs: Clear to auscultation, nonlabored.  Cardiac: Regular rate and rhythm with ectopy, 2-3/6 systolic murmur heard best at the base, no loud diastolic murmur, no S3.  Abdomen: Soft, nontender.  Extremities: No pitting edema, distal pulses 2+.    Problem List and Plan   SVT/ PSVT/ PAT Symptomatically improved following increase in Toprol-XL. No changes made today. Continue observation.  AORTIC STENOSIS/ INSUFFICIENCY, NON-RHEUMATIC Assessed as moderate, by recent followup echo; normal LVF. Patient remains asymptomatic. Recommend repeat study in 6 months.    Jonelle Sidle, M.D., F.A.C.C.

## 2012-04-12 NOTE — Assessment & Plan Note (Signed)
Symptomatically improved following increase in Toprol-XL. No changes made today. Continue observation.

## 2012-04-12 NOTE — Assessment & Plan Note (Signed)
Assessed as moderate, by recent followup echo; normal LVF. Patient remains asymptomatic. Recommend repeat study in 6 months.

## 2012-04-19 DIAGNOSIS — Z23 Encounter for immunization: Secondary | ICD-10-CM | POA: Diagnosis not present

## 2012-06-06 DIAGNOSIS — H353290 Exudative age-related macular degeneration, unspecified eye, stage unspecified: Secondary | ICD-10-CM | POA: Diagnosis not present

## 2012-06-06 DIAGNOSIS — H35359 Cystoid macular degeneration, unspecified eye: Secondary | ICD-10-CM | POA: Diagnosis not present

## 2012-06-06 DIAGNOSIS — H35059 Retinal neovascularization, unspecified, unspecified eye: Secondary | ICD-10-CM | POA: Diagnosis not present

## 2012-08-10 DIAGNOSIS — H35059 Retinal neovascularization, unspecified, unspecified eye: Secondary | ICD-10-CM | POA: Diagnosis not present

## 2012-08-10 DIAGNOSIS — H353290 Exudative age-related macular degeneration, unspecified eye, stage unspecified: Secondary | ICD-10-CM | POA: Diagnosis not present

## 2012-08-11 DIAGNOSIS — I10 Essential (primary) hypertension: Secondary | ICD-10-CM | POA: Diagnosis not present

## 2012-08-11 DIAGNOSIS — E039 Hypothyroidism, unspecified: Secondary | ICD-10-CM | POA: Diagnosis not present

## 2012-08-24 DIAGNOSIS — E039 Hypothyroidism, unspecified: Secondary | ICD-10-CM | POA: Diagnosis not present

## 2012-08-24 DIAGNOSIS — Z1231 Encounter for screening mammogram for malignant neoplasm of breast: Secondary | ICD-10-CM | POA: Diagnosis not present

## 2012-08-24 DIAGNOSIS — M1990 Unspecified osteoarthritis, unspecified site: Secondary | ICD-10-CM | POA: Diagnosis not present

## 2012-08-24 DIAGNOSIS — R002 Palpitations: Secondary | ICD-10-CM | POA: Diagnosis not present

## 2012-08-24 DIAGNOSIS — I10 Essential (primary) hypertension: Secondary | ICD-10-CM | POA: Diagnosis not present

## 2012-08-24 DIAGNOSIS — E782 Mixed hyperlipidemia: Secondary | ICD-10-CM | POA: Diagnosis not present

## 2012-09-07 DIAGNOSIS — L259 Unspecified contact dermatitis, unspecified cause: Secondary | ICD-10-CM | POA: Diagnosis not present

## 2012-09-08 ENCOUNTER — Ambulatory Visit (INDEPENDENT_AMBULATORY_CARE_PROVIDER_SITE_OTHER): Payer: Medicare Other | Admitting: Cardiology

## 2012-09-08 ENCOUNTER — Encounter: Payer: Self-pay | Admitting: Cardiology

## 2012-09-08 VITALS — BP 129/78 | HR 73 | Ht 65.0 in | Wt 150.0 lb

## 2012-09-08 DIAGNOSIS — I471 Supraventricular tachycardia, unspecified: Secondary | ICD-10-CM

## 2012-09-08 DIAGNOSIS — I359 Nonrheumatic aortic valve disorder, unspecified: Secondary | ICD-10-CM

## 2012-09-08 NOTE — Patient Instructions (Signed)
   Echo  Office will contact with results Your physician wants you to follow up in: 6 months.  You will receive a reminder letter in the mail one-two months in advance.  If you don't receive a letter, please call our office to schedule the follow up appointment

## 2012-09-08 NOTE — Progress Notes (Signed)
Clinical Summary Christine Pugh is an 79 y.o.female presenting for followup. She was seen in September 2013. If she continues to do remarkably well. Exercises at least 4 days a week, swims 500 yards about 30 minutes.  Echocardiogram in August 2013 showed LVEF 55-60%, grade 2 diastolic dysfunction, moderate to severe AS, mild AR, dilated aortic root. I discussed with her the fact that the aortic valve stenosis has progressed over time.  ECG today shows sinus rhythm with small R prime in V1, leftward axis. She does have brief, occasional palpitations, nothing prolonged. No cardiac hospitalizations.  No Known Allergies  Current Outpatient Prescriptions  Medication Sig Dispense Refill  . aspirin 81 MG tablet Take 81 mg by mouth daily.        . calcium carbonate (OS-CAL) 600 MG TABS Take 600 mg by mouth daily.       Marland Kitchen levothyroxine (SYNTHROID, LEVOTHROID) 100 MCG tablet Take 100 mcg by mouth daily.        . Magnesium 500 MG TABS Take 1 tablet by mouth daily.        . metoprolol succinate (TOPROL-XL) 25 MG 24 hr tablet Take 1 tablet (25 mg total) by mouth 2 (two) times daily.  180 tablet  3  . Multiple Vitamin (MULTIVITAMIN) tablet Take 1 tablet by mouth daily.        . Multiple Vitamins-Minerals (PRESERVISION/LUTEIN PO) Take 1 tablet by mouth 2 (two) times daily.        Marland Kitchen ofloxacin (OCUFLOX) 0.3 % ophthalmic solution Place 3 drops into the left eye 3 (three) times daily. Take 3 drops to left eye for 3 days after injection.      . Omega-3 Fatty Acids (FISH OIL) 1000 MG CAPS Take 1 capsule by mouth daily.        . predniSONE (DELTASONE) 20 MG tablet Take 20 mg by mouth 2 (two) times daily.       . Red Yeast Rice 600 MG TABS Take 1 tablet by mouth 2 (two) times daily.        Marland Kitchen triamcinolone cream (KENALOG) 0.1 % Apply 1 application topically as needed.       . triamterene-hydrochlorothiazide (DYAZIDE) 37.5-25 MG per capsule Take 1 capsule by mouth daily.        . vitamin E 400 UNIT capsule Take 400  Units by mouth daily.          Past Medical History  Diagnosis Date  . Aortic insufficiency     Mild  . Aortic stenosis     Mild  . PSVT (paroxysmal supraventricular tachycardia)     Social History Christine Pugh reports that she has never smoked. She has never used smokeless tobacco. Christine Pugh reports that she does not drink alcohol.  Review of Systems Negative except as outlined.  Physical Examination Filed Vitals:   09/08/12 1323  BP: 129/78  Pulse: 73   Filed Weights   09/08/12 1323  Weight: 150 lb (68.04 kg)    Normally nourished appearing elderly woman, in no acute distress.  HEENT: Conjunctiva and lids normal, oropharynx clear.  Neck: Supple, no carotid bruits, no thyromegaly.  Lungs: Clear to auscultation, nonlabored.  Cardiac: Regular rate and rhythm with ectopy, 3/6 systolic murmur heard best at the base, no loud diastolic murmur, no S3.  Abdomen: Soft, nontender.  Extremities: No pitting edema, distal pulses 2+.    Problem List and Plan   AORTIC STENOSIS/ INSUFFICIENCY, NON-RHEUMATIC Plan followup echocardiogram for reassessment of aortic stenosis severity.  This has progressed over time, although she does very well in terms of symptoms. We did discuss the natural history of aortic stenosis, options for management, realizing her good functional status but advanced age.  SVT/ PSVT/ PAT Well controlled on current regimen. No changes made.    Jonelle Sidle, M.D., F.A.C.C.

## 2012-09-08 NOTE — Assessment & Plan Note (Signed)
Well controlled on current regimen. No changes made.

## 2012-09-08 NOTE — Assessment & Plan Note (Signed)
Plan followup echocardiogram for reassessment of aortic stenosis severity. This has progressed over time, although she does very well in terms of symptoms. We did discuss the natural history of aortic stenosis, options for management, realizing her good functional status but advanced age.

## 2012-09-13 ENCOUNTER — Other Ambulatory Visit: Payer: Self-pay

## 2012-09-13 ENCOUNTER — Other Ambulatory Visit (INDEPENDENT_AMBULATORY_CARE_PROVIDER_SITE_OTHER): Payer: Medicare Other

## 2012-09-13 DIAGNOSIS — I471 Supraventricular tachycardia: Secondary | ICD-10-CM | POA: Diagnosis not present

## 2012-09-13 DIAGNOSIS — I359 Nonrheumatic aortic valve disorder, unspecified: Secondary | ICD-10-CM | POA: Diagnosis not present

## 2012-09-17 ENCOUNTER — Other Ambulatory Visit: Payer: Self-pay

## 2012-09-22 ENCOUNTER — Other Ambulatory Visit: Payer: Medicare Other

## 2012-09-26 ENCOUNTER — Telehealth: Payer: Self-pay | Admitting: *Deleted

## 2012-09-26 DIAGNOSIS — I359 Nonrheumatic aortic valve disorder, unspecified: Secondary | ICD-10-CM

## 2012-09-26 NOTE — Telephone Encounter (Signed)
Message copied by Eustace Moore on Mon Sep 26, 2012  4:14 PM ------      Message from: MCDOWELL, Illene Bolus      Created: Wed Sep 14, 2012  1:13 PM       Reviewed report. Overall moderate aortic stenosis, mild aortic regurgitation. We will continue clinical followup at this point. Repeat study in 6 months with next visit. ------

## 2012-09-26 NOTE — Telephone Encounter (Signed)
Message copied by Eustace Moore on Mon Sep 26, 2012  4:13 PM ------      Message from: MCDOWELL, Illene Bolus      Created: Wed Sep 14, 2012  1:13 PM       Reviewed report. Overall moderate aortic stenosis, mild aortic regurgitation. We will continue clinical followup at this point. Repeat study in 6 months with next visit. ------

## 2012-09-26 NOTE — Telephone Encounter (Signed)
Patient informed. 

## 2012-10-10 DIAGNOSIS — H43399 Other vitreous opacities, unspecified eye: Secondary | ICD-10-CM | POA: Diagnosis not present

## 2012-10-10 DIAGNOSIS — H353190 Nonexudative age-related macular degeneration, unspecified eye, stage unspecified: Secondary | ICD-10-CM | POA: Diagnosis not present

## 2012-10-10 DIAGNOSIS — Z961 Presence of intraocular lens: Secondary | ICD-10-CM | POA: Diagnosis not present

## 2012-10-10 DIAGNOSIS — H353290 Exudative age-related macular degeneration, unspecified eye, stage unspecified: Secondary | ICD-10-CM | POA: Diagnosis not present

## 2012-10-10 DIAGNOSIS — H26499 Other secondary cataract, unspecified eye: Secondary | ICD-10-CM | POA: Diagnosis not present

## 2012-10-11 DIAGNOSIS — L570 Actinic keratosis: Secondary | ICD-10-CM | POA: Diagnosis not present

## 2012-10-11 DIAGNOSIS — L821 Other seborrheic keratosis: Secondary | ICD-10-CM | POA: Diagnosis not present

## 2012-10-11 DIAGNOSIS — D239 Other benign neoplasm of skin, unspecified: Secondary | ICD-10-CM | POA: Diagnosis not present

## 2012-11-03 DIAGNOSIS — H35359 Cystoid macular degeneration, unspecified eye: Secondary | ICD-10-CM | POA: Diagnosis not present

## 2012-11-03 DIAGNOSIS — H353290 Exudative age-related macular degeneration, unspecified eye, stage unspecified: Secondary | ICD-10-CM | POA: Diagnosis not present

## 2012-11-03 DIAGNOSIS — H35059 Retinal neovascularization, unspecified, unspecified eye: Secondary | ICD-10-CM | POA: Diagnosis not present

## 2012-12-12 DIAGNOSIS — S3981XA Other specified injuries of abdomen, initial encounter: Secondary | ICD-10-CM | POA: Diagnosis not present

## 2012-12-12 DIAGNOSIS — R109 Unspecified abdominal pain: Secondary | ICD-10-CM | POA: Diagnosis not present

## 2012-12-12 DIAGNOSIS — S2220XA Unspecified fracture of sternum, initial encounter for closed fracture: Secondary | ICD-10-CM | POA: Diagnosis not present

## 2012-12-12 DIAGNOSIS — R079 Chest pain, unspecified: Secondary | ICD-10-CM | POA: Diagnosis not present

## 2012-12-12 DIAGNOSIS — M79609 Pain in unspecified limb: Secondary | ICD-10-CM | POA: Diagnosis not present

## 2012-12-12 DIAGNOSIS — S298XXA Other specified injuries of thorax, initial encounter: Secondary | ICD-10-CM | POA: Diagnosis not present

## 2012-12-12 DIAGNOSIS — S8010XA Contusion of unspecified lower leg, initial encounter: Secondary | ICD-10-CM | POA: Diagnosis not present

## 2012-12-12 DIAGNOSIS — R52 Pain, unspecified: Secondary | ICD-10-CM | POA: Diagnosis not present

## 2012-12-13 DIAGNOSIS — IMO0001 Reserved for inherently not codable concepts without codable children: Secondary | ICD-10-CM | POA: Diagnosis not present

## 2012-12-13 DIAGNOSIS — S81809A Unspecified open wound, unspecified lower leg, initial encounter: Secondary | ICD-10-CM | POA: Diagnosis not present

## 2012-12-13 DIAGNOSIS — S91009A Unspecified open wound, unspecified ankle, initial encounter: Secondary | ICD-10-CM | POA: Diagnosis not present

## 2012-12-13 DIAGNOSIS — I10 Essential (primary) hypertension: Secondary | ICD-10-CM | POA: Diagnosis not present

## 2012-12-13 DIAGNOSIS — I4891 Unspecified atrial fibrillation: Secondary | ICD-10-CM | POA: Diagnosis not present

## 2012-12-14 DIAGNOSIS — IMO0001 Reserved for inherently not codable concepts without codable children: Secondary | ICD-10-CM | POA: Diagnosis not present

## 2012-12-14 DIAGNOSIS — S81809A Unspecified open wound, unspecified lower leg, initial encounter: Secondary | ICD-10-CM | POA: Diagnosis not present

## 2012-12-14 DIAGNOSIS — I4891 Unspecified atrial fibrillation: Secondary | ICD-10-CM | POA: Diagnosis not present

## 2012-12-14 DIAGNOSIS — I10 Essential (primary) hypertension: Secondary | ICD-10-CM | POA: Diagnosis not present

## 2012-12-14 DIAGNOSIS — E876 Hypokalemia: Secondary | ICD-10-CM | POA: Diagnosis not present

## 2012-12-14 DIAGNOSIS — S2220XA Unspecified fracture of sternum, initial encounter for closed fracture: Secondary | ICD-10-CM | POA: Diagnosis not present

## 2012-12-15 DIAGNOSIS — I4891 Unspecified atrial fibrillation: Secondary | ICD-10-CM | POA: Diagnosis not present

## 2012-12-15 DIAGNOSIS — S81009A Unspecified open wound, unspecified knee, initial encounter: Secondary | ICD-10-CM | POA: Diagnosis not present

## 2012-12-15 DIAGNOSIS — I10 Essential (primary) hypertension: Secondary | ICD-10-CM | POA: Diagnosis not present

## 2012-12-15 DIAGNOSIS — IMO0001 Reserved for inherently not codable concepts without codable children: Secondary | ICD-10-CM | POA: Diagnosis not present

## 2012-12-16 DIAGNOSIS — S81009A Unspecified open wound, unspecified knee, initial encounter: Secondary | ICD-10-CM | POA: Diagnosis not present

## 2012-12-16 DIAGNOSIS — S81809A Unspecified open wound, unspecified lower leg, initial encounter: Secondary | ICD-10-CM | POA: Diagnosis not present

## 2012-12-16 DIAGNOSIS — IMO0001 Reserved for inherently not codable concepts without codable children: Secondary | ICD-10-CM | POA: Diagnosis not present

## 2012-12-16 DIAGNOSIS — I10 Essential (primary) hypertension: Secondary | ICD-10-CM | POA: Diagnosis not present

## 2012-12-16 DIAGNOSIS — I4891 Unspecified atrial fibrillation: Secondary | ICD-10-CM | POA: Diagnosis not present

## 2012-12-19 DIAGNOSIS — S91009A Unspecified open wound, unspecified ankle, initial encounter: Secondary | ICD-10-CM | POA: Diagnosis not present

## 2012-12-19 DIAGNOSIS — I10 Essential (primary) hypertension: Secondary | ICD-10-CM | POA: Diagnosis not present

## 2012-12-19 DIAGNOSIS — IMO0001 Reserved for inherently not codable concepts without codable children: Secondary | ICD-10-CM | POA: Diagnosis not present

## 2012-12-19 DIAGNOSIS — I4891 Unspecified atrial fibrillation: Secondary | ICD-10-CM | POA: Diagnosis not present

## 2012-12-21 DIAGNOSIS — I4891 Unspecified atrial fibrillation: Secondary | ICD-10-CM | POA: Diagnosis not present

## 2012-12-21 DIAGNOSIS — I10 Essential (primary) hypertension: Secondary | ICD-10-CM | POA: Diagnosis not present

## 2012-12-21 DIAGNOSIS — IMO0001 Reserved for inherently not codable concepts without codable children: Secondary | ICD-10-CM | POA: Diagnosis not present

## 2012-12-21 DIAGNOSIS — S81009A Unspecified open wound, unspecified knee, initial encounter: Secondary | ICD-10-CM | POA: Diagnosis not present

## 2012-12-28 DIAGNOSIS — IMO0001 Reserved for inherently not codable concepts without codable children: Secondary | ICD-10-CM | POA: Diagnosis not present

## 2012-12-28 DIAGNOSIS — I10 Essential (primary) hypertension: Secondary | ICD-10-CM | POA: Diagnosis not present

## 2012-12-28 DIAGNOSIS — S81809A Unspecified open wound, unspecified lower leg, initial encounter: Secondary | ICD-10-CM | POA: Diagnosis not present

## 2012-12-28 DIAGNOSIS — I4891 Unspecified atrial fibrillation: Secondary | ICD-10-CM | POA: Diagnosis not present

## 2013-01-02 DIAGNOSIS — IMO0001 Reserved for inherently not codable concepts without codable children: Secondary | ICD-10-CM | POA: Diagnosis not present

## 2013-01-02 DIAGNOSIS — I10 Essential (primary) hypertension: Secondary | ICD-10-CM | POA: Diagnosis not present

## 2013-01-02 DIAGNOSIS — S81009A Unspecified open wound, unspecified knee, initial encounter: Secondary | ICD-10-CM | POA: Diagnosis not present

## 2013-01-02 DIAGNOSIS — I4891 Unspecified atrial fibrillation: Secondary | ICD-10-CM | POA: Diagnosis not present

## 2013-01-04 DIAGNOSIS — I10 Essential (primary) hypertension: Secondary | ICD-10-CM | POA: Diagnosis not present

## 2013-01-04 DIAGNOSIS — IMO0001 Reserved for inherently not codable concepts without codable children: Secondary | ICD-10-CM | POA: Diagnosis not present

## 2013-01-04 DIAGNOSIS — I4891 Unspecified atrial fibrillation: Secondary | ICD-10-CM | POA: Diagnosis not present

## 2013-01-04 DIAGNOSIS — S81009A Unspecified open wound, unspecified knee, initial encounter: Secondary | ICD-10-CM | POA: Diagnosis not present

## 2013-01-11 DIAGNOSIS — S81009A Unspecified open wound, unspecified knee, initial encounter: Secondary | ICD-10-CM | POA: Diagnosis not present

## 2013-01-11 DIAGNOSIS — I4891 Unspecified atrial fibrillation: Secondary | ICD-10-CM | POA: Diagnosis not present

## 2013-01-11 DIAGNOSIS — IMO0001 Reserved for inherently not codable concepts without codable children: Secondary | ICD-10-CM | POA: Diagnosis not present

## 2013-01-11 DIAGNOSIS — I10 Essential (primary) hypertension: Secondary | ICD-10-CM | POA: Diagnosis not present

## 2013-02-14 DIAGNOSIS — E876 Hypokalemia: Secondary | ICD-10-CM | POA: Diagnosis not present

## 2013-02-14 DIAGNOSIS — E782 Mixed hyperlipidemia: Secondary | ICD-10-CM | POA: Diagnosis not present

## 2013-02-14 DIAGNOSIS — I10 Essential (primary) hypertension: Secondary | ICD-10-CM | POA: Diagnosis not present

## 2013-02-14 DIAGNOSIS — E039 Hypothyroidism, unspecified: Secondary | ICD-10-CM | POA: Diagnosis not present

## 2013-02-21 DIAGNOSIS — R002 Palpitations: Secondary | ICD-10-CM | POA: Diagnosis not present

## 2013-02-21 DIAGNOSIS — E782 Mixed hyperlipidemia: Secondary | ICD-10-CM | POA: Diagnosis not present

## 2013-02-21 DIAGNOSIS — I10 Essential (primary) hypertension: Secondary | ICD-10-CM | POA: Diagnosis not present

## 2013-02-21 DIAGNOSIS — M1990 Unspecified osteoarthritis, unspecified site: Secondary | ICD-10-CM | POA: Diagnosis not present

## 2013-02-21 DIAGNOSIS — E039 Hypothyroidism, unspecified: Secondary | ICD-10-CM | POA: Diagnosis not present

## 2013-02-21 DIAGNOSIS — I359 Nonrheumatic aortic valve disorder, unspecified: Secondary | ICD-10-CM | POA: Diagnosis not present

## 2013-03-08 ENCOUNTER — Other Ambulatory Visit: Payer: Self-pay

## 2013-03-20 DIAGNOSIS — H353190 Nonexudative age-related macular degeneration, unspecified eye, stage unspecified: Secondary | ICD-10-CM | POA: Diagnosis not present

## 2013-03-20 DIAGNOSIS — H35059 Retinal neovascularization, unspecified, unspecified eye: Secondary | ICD-10-CM | POA: Diagnosis not present

## 2013-03-20 DIAGNOSIS — H35359 Cystoid macular degeneration, unspecified eye: Secondary | ICD-10-CM | POA: Diagnosis not present

## 2013-03-20 DIAGNOSIS — H35369 Drusen (degenerative) of macula, unspecified eye: Secondary | ICD-10-CM | POA: Diagnosis not present

## 2013-03-20 DIAGNOSIS — H353290 Exudative age-related macular degeneration, unspecified eye, stage unspecified: Secondary | ICD-10-CM | POA: Diagnosis not present

## 2013-03-22 ENCOUNTER — Other Ambulatory Visit: Payer: Self-pay

## 2013-03-22 ENCOUNTER — Other Ambulatory Visit (INDEPENDENT_AMBULATORY_CARE_PROVIDER_SITE_OTHER): Payer: Medicare Other

## 2013-03-22 DIAGNOSIS — I359 Nonrheumatic aortic valve disorder, unspecified: Secondary | ICD-10-CM | POA: Diagnosis not present

## 2013-03-22 DIAGNOSIS — I059 Rheumatic mitral valve disease, unspecified: Secondary | ICD-10-CM

## 2013-03-23 ENCOUNTER — Encounter: Payer: Self-pay | Admitting: Cardiology

## 2013-03-23 ENCOUNTER — Ambulatory Visit (INDEPENDENT_AMBULATORY_CARE_PROVIDER_SITE_OTHER): Payer: Medicare Other | Admitting: Cardiology

## 2013-03-23 VITALS — BP 121/69 | HR 69 | Ht 65.0 in | Wt 149.0 lb

## 2013-03-23 DIAGNOSIS — I359 Nonrheumatic aortic valve disorder, unspecified: Secondary | ICD-10-CM

## 2013-03-23 DIAGNOSIS — I471 Supraventricular tachycardia: Secondary | ICD-10-CM | POA: Diagnosis not present

## 2013-03-23 NOTE — Assessment & Plan Note (Signed)
Continue beta blocker. 

## 2013-03-23 NOTE — Patient Instructions (Addendum)
Your physician recommends that you schedule a follow-up appointment in: 6 months. You will receive a reminder letter in the mail in about 4 months reminding you to call and schedule your appointment. If you don't receive this letter, please contact our office. Your physician recommends that you continue on your current medications as directed. Please refer to the Current Medication list given to you today. Your physician has requested that you have an echocardiogram. Echocardiography is a painless test that uses sound waves to create images of your heart. It provides your doctor with information about the size and shape of your heart and how well your heart's chambers and valves are working. This procedure takes approximately one hour. There are no restrictions for this procedure.  

## 2013-03-23 NOTE — Assessment & Plan Note (Signed)
Now with evidence of moderate aortic stenosis, mild aortic regurgitation. We discussed this today. Recommended that she continue strategy of observation, we will followup echocardiogram in one year, although sooner if she has any significantly progressive limitations.

## 2013-03-23 NOTE — Progress Notes (Signed)
Clinical Summary Christine Pugh is an 79 y.o.female last seen in February of this year. She is here with her son today. She does report some decrease in stamina, not able to do the 200 free style as she used to. She still swims regularly however. No angina symptoms. Also has intermittent spells of palpitations, no dizziness or syncope.  Recent echocardiogram on 8/20 showed moderate LVH with LVEF 65%, overall moderate aortic stenosis with mild aortic regurgitation, mean gradient 19 mm of mercury, LVOT to AV velocity ratio 0.41, valve area approximately 1.2 cm. Ascending aorta mildly dilated. Mild mitral regurgitation, moderate to severe left atrial enlargement, mildly dilated right ventricle.  I discussed with them the general natural history of aortic stenosis. We will plan to continue to follow her clinically and with an echocardiogram in a year.   No Known Allergies  Current Outpatient Prescriptions  Medication Sig Dispense Refill  . aspirin 81 MG tablet Take 81 mg by mouth daily.        Marland Kitchen levothyroxine (SYNTHROID, LEVOTHROID) 100 MCG tablet Take 100 mcg by mouth daily.        . metoprolol succinate (TOPROL-XL) 25 MG 24 hr tablet Take 1 tablet (25 mg total) by mouth 2 (two) times daily.  180 tablet  3  . Multiple Vitamin (MULTIVITAMIN) tablet Take 1 tablet by mouth daily.        . Multiple Vitamins-Minerals (PRESERVISION/LUTEIN PO) Take 1 tablet by mouth 2 (two) times daily.        Marland Kitchen ofloxacin (OCUFLOX) 0.3 % ophthalmic solution Place 3 drops into the left eye 3 (three) times daily. Take 3 drops to left eye for 3 days after injection.      . Omega-3 Fatty Acids (FISH OIL) 1000 MG CAPS Take 1 capsule by mouth daily.        . potassium chloride (K-DUR) 10 MEQ tablet Take 20 mEq by mouth daily.      . Red Yeast Rice 600 MG TABS Take 1 tablet by mouth 2 (two) times daily.        Marland Kitchen triamcinolone cream (KENALOG) 0.1 % Apply 1 application topically as needed.       .  triamterene-hydrochlorothiazide (DYAZIDE) 37.5-25 MG per capsule Take 1 capsule by mouth daily.         No current facility-administered medications for this visit.    Past Medical History  Diagnosis Date  . Aortic insufficiency     Mild  . Aortic stenosis     Moderate 03/2013  . PSVT (paroxysmal supraventricular tachycardia)     Social History Christine Pugh reports that she has never smoked. She has never used smokeless tobacco. Christine Pugh reports that she does not drink alcohol.  Review of Systems Negative except as outlined.  Physical Examination Filed Vitals:   03/23/13 1255  BP: 121/69  Pulse: 69   Filed Weights   03/23/13 1255  Weight: 149 lb (67.586 kg)    Normally nourished appearing elderly woman, appears comfortable at rest.  HEENT: Conjunctiva and lids normal, oropharynx clear.  Neck: Supple, no carotid bruits, no thyromegaly.  Lungs: Clear to auscultation, nonlabored.  Cardiac: Regular rate and rhythm with ectopy, 3/6 systolic murmur heard best at the base, no loud diastolic murmur, no S3.  Abdomen: Soft, nontender.  Extremities: No pitting edema, distal pulses 2+.     Problem List and Plan   AORTIC STENOSIS/ INSUFFICIENCY, NON-RHEUMATIC Now with evidence of moderate aortic stenosis, mild aortic regurgitation. We discussed this today.  Recommended that she continue strategy of observation, we will followup echocardiogram in one year, although sooner if she has any significantly progressive limitations.  SVT/ PSVT/ PAT Continue beta blocker.    Jonelle Sidle, M.D., F.A.C.C.

## 2013-04-10 ENCOUNTER — Other Ambulatory Visit: Payer: Self-pay | Admitting: Cardiology

## 2013-04-10 MED ORDER — METOPROLOL SUCCINATE ER 25 MG PO TB24: 25.0000 mg | ORAL_TABLET | Freq: Two times a day (BID) | ORAL | Status: DC

## 2013-04-18 DIAGNOSIS — Z23 Encounter for immunization: Secondary | ICD-10-CM | POA: Diagnosis not present

## 2013-06-08 ENCOUNTER — Other Ambulatory Visit: Payer: Self-pay

## 2013-08-14 DIAGNOSIS — T7840XA Allergy, unspecified, initial encounter: Secondary | ICD-10-CM | POA: Diagnosis not present

## 2013-08-21 DIAGNOSIS — E782 Mixed hyperlipidemia: Secondary | ICD-10-CM | POA: Diagnosis not present

## 2013-08-21 DIAGNOSIS — E039 Hypothyroidism, unspecified: Secondary | ICD-10-CM | POA: Diagnosis not present

## 2013-08-28 DIAGNOSIS — E782 Mixed hyperlipidemia: Secondary | ICD-10-CM | POA: Diagnosis not present

## 2013-08-28 DIAGNOSIS — E039 Hypothyroidism, unspecified: Secondary | ICD-10-CM | POA: Diagnosis not present

## 2013-08-28 DIAGNOSIS — R059 Cough, unspecified: Secondary | ICD-10-CM | POA: Diagnosis not present

## 2013-08-28 DIAGNOSIS — I359 Nonrheumatic aortic valve disorder, unspecified: Secondary | ICD-10-CM | POA: Diagnosis not present

## 2013-08-28 DIAGNOSIS — R05 Cough: Secondary | ICD-10-CM | POA: Diagnosis not present

## 2013-08-28 DIAGNOSIS — I10 Essential (primary) hypertension: Secondary | ICD-10-CM | POA: Diagnosis not present

## 2013-08-28 DIAGNOSIS — Z1331 Encounter for screening for depression: Secondary | ICD-10-CM | POA: Diagnosis not present

## 2013-08-28 DIAGNOSIS — R002 Palpitations: Secondary | ICD-10-CM | POA: Diagnosis not present

## 2013-08-28 DIAGNOSIS — M1990 Unspecified osteoarthritis, unspecified site: Secondary | ICD-10-CM | POA: Diagnosis not present

## 2013-09-18 DIAGNOSIS — H353190 Nonexudative age-related macular degeneration, unspecified eye, stage unspecified: Secondary | ICD-10-CM | POA: Diagnosis not present

## 2013-09-18 DIAGNOSIS — H35369 Drusen (degenerative) of macula, unspecified eye: Secondary | ICD-10-CM | POA: Diagnosis not present

## 2013-09-18 DIAGNOSIS — H35359 Cystoid macular degeneration, unspecified eye: Secondary | ICD-10-CM | POA: Diagnosis not present

## 2013-09-18 DIAGNOSIS — H353290 Exudative age-related macular degeneration, unspecified eye, stage unspecified: Secondary | ICD-10-CM | POA: Diagnosis not present

## 2013-09-19 ENCOUNTER — Ambulatory Visit: Payer: Medicare Other | Admitting: Cardiology

## 2013-11-02 DIAGNOSIS — H353190 Nonexudative age-related macular degeneration, unspecified eye, stage unspecified: Secondary | ICD-10-CM | POA: Diagnosis not present

## 2013-11-02 DIAGNOSIS — H353290 Exudative age-related macular degeneration, unspecified eye, stage unspecified: Secondary | ICD-10-CM | POA: Diagnosis not present

## 2013-11-02 DIAGNOSIS — Z961 Presence of intraocular lens: Secondary | ICD-10-CM | POA: Diagnosis not present

## 2013-11-02 DIAGNOSIS — H524 Presbyopia: Secondary | ICD-10-CM | POA: Diagnosis not present

## 2013-11-02 DIAGNOSIS — H26499 Other secondary cataract, unspecified eye: Secondary | ICD-10-CM | POA: Diagnosis not present

## 2013-11-08 ENCOUNTER — Ambulatory Visit: Payer: Medicare Other | Admitting: Cardiology

## 2013-11-13 DIAGNOSIS — H353290 Exudative age-related macular degeneration, unspecified eye, stage unspecified: Secondary | ICD-10-CM | POA: Diagnosis not present

## 2013-11-13 DIAGNOSIS — H43819 Vitreous degeneration, unspecified eye: Secondary | ICD-10-CM | POA: Diagnosis not present

## 2013-11-13 DIAGNOSIS — H3570 Unspecified separation of retinal layers: Secondary | ICD-10-CM | POA: Diagnosis not present

## 2013-11-13 DIAGNOSIS — H353190 Nonexudative age-related macular degeneration, unspecified eye, stage unspecified: Secondary | ICD-10-CM | POA: Diagnosis not present

## 2013-11-27 DIAGNOSIS — M25519 Pain in unspecified shoulder: Secondary | ICD-10-CM | POA: Diagnosis not present

## 2013-11-27 DIAGNOSIS — M25619 Stiffness of unspecified shoulder, not elsewhere classified: Secondary | ICD-10-CM | POA: Diagnosis not present

## 2013-11-27 DIAGNOSIS — M6281 Muscle weakness (generalized): Secondary | ICD-10-CM | POA: Diagnosis not present

## 2013-11-29 DIAGNOSIS — H26499 Other secondary cataract, unspecified eye: Secondary | ICD-10-CM | POA: Diagnosis not present

## 2013-11-30 ENCOUNTER — Encounter: Payer: Self-pay | Admitting: Cardiology

## 2013-11-30 DIAGNOSIS — M6281 Muscle weakness (generalized): Secondary | ICD-10-CM | POA: Diagnosis not present

## 2013-11-30 DIAGNOSIS — M25519 Pain in unspecified shoulder: Secondary | ICD-10-CM | POA: Diagnosis not present

## 2013-11-30 DIAGNOSIS — M25619 Stiffness of unspecified shoulder, not elsewhere classified: Secondary | ICD-10-CM | POA: Diagnosis not present

## 2013-11-30 NOTE — Progress Notes (Signed)
Clinical Summary Christine Pugh is a 80 y.o.female last seen in August 2014. She reports no major changes, actually fewer palpitations since I last saw her. She still swims regularly, will be competing as the single entrant in her age group in the [place] next [place].  Recent echocardiogram in August 2014 showed moderate LVH with LVEF 65%, overall moderate aortic stenosis with mild aortic regurgitation, mean gradient 19 mm of mercury, LVOT to AV velocity ratio 0.41, valve area approximately 1.2 cm. Ascending aorta mildly dilated. Mild mitral regurgitation, moderate to severe left atrial enlargement, mildly dilated right ventricle.  ECG today shows sinus rhythm with leftward axis, nonspecific T-wave changes, poor R wave progression.  She reports compliance with her medications.  No Known Allergies  Current Outpatient Prescriptions  Medication Sig Dispense Refill  . aspirin 81 MG tablet Take 81 mg by mouth daily.        Marland Kitchen levothyroxine (SYNTHROID, LEVOTHROID) 100 MCG tablet Take 100 mcg by mouth daily.        . metoprolol succinate (TOPROL-XL) 25 MG 24 hr tablet Take 1 tablet (25 mg total) by mouth 2 (two) times daily.  180 tablet  3  . Multiple Vitamin (MULTIVITAMIN) tablet Take 1 tablet by mouth daily.        . Multiple Vitamins-Minerals (PRESERVISION/LUTEIN PO) Take 1 tablet by mouth 2 (two) times daily.        Marland Kitchen ofloxacin (OCUFLOX) 0.3 % ophthalmic solution Place 3 drops into the left eye 3 (three) times daily. Take 3 drops to left eye for 3 days after injection.      . Omega-3 Fatty Acids (FISH OIL) 1000 MG CAPS Take 1 capsule by mouth daily.        . potassium chloride (K-DUR) 10 MEQ tablet Take 10 mEq by mouth daily.       . Red Yeast Rice 600 MG TABS Take 1 tablet by mouth daily.       Marland Kitchen triamcinolone cream (KENALOG) 0.1 % Apply 1 application topically as needed.       . triamterene-hydrochlorothiazide (DYAZIDE) 37.5-25 MG per capsule Take 1 capsule by mouth daily.         No  current facility-administered medications for this visit.    Past Medical History  Diagnosis Date  . Aortic insufficiency     Mild  . Aortic stenosis     Moderate 03/2013  . PSVT (paroxysmal supraventricular tachycardia)     Social History Christine Pugh reports that she has never smoked. She has never used smokeless tobacco. Christine Pugh reports that she does not drink alcohol.  Review of Systems Negative except as outlined.  Physical Examination Filed Vitals:   12/01/13 1409  BP: 114/71  Pulse: 75   Filed Weights   12/01/13 1409  Weight: 150 lb (68.04 kg)    Normally nourished appearing elderly woman, appears comfortable at rest.  HEENT: Conjunctiva and lids normal, oropharynx clear.  Neck: Supple, no carotid bruits, no thyromegaly.  Lungs: Clear to auscultation, nonlabored.  Cardiac: Regular rate and rhythm with ectopy, 3/6 systolic murmur heard best at the base, no loud diastolic murmur, no S3.  Abdomen: Soft, nontender.  Extremities: No pitting edema, distal pulses 2+.    Problem List and Plan   AORTIC STENOSIS/ INSUFFICIENCY, NON-RHEUMATIC Continue to follow her conservatively for now. We will get an echocardiogram for next visit to assess progression. She is very functionally active.  SVT/ PSVT/ PAT Well controlled on current regimen, no change  to beta blocker dose.    Satira Sark, M.D., F.A.C.C.

## 2013-12-01 ENCOUNTER — Encounter: Payer: Self-pay | Admitting: Cardiology

## 2013-12-01 ENCOUNTER — Ambulatory Visit (INDEPENDENT_AMBULATORY_CARE_PROVIDER_SITE_OTHER): Payer: Medicare Other | Admitting: Cardiology

## 2013-12-01 VITALS — BP 114/71 | HR 75 | Ht 65.5 in | Wt 150.0 lb

## 2013-12-01 DIAGNOSIS — I359 Nonrheumatic aortic valve disorder, unspecified: Secondary | ICD-10-CM

## 2013-12-01 DIAGNOSIS — I471 Supraventricular tachycardia: Secondary | ICD-10-CM

## 2013-12-01 NOTE — Patient Instructions (Signed)
Continue all current medications. Your physician wants you to follow up in: 6 months.  You will receive a reminder letter in the mail one-two months in advance.  If you don't receive a letter, please call our office to schedule the follow up appointment  Your physician has requested that you have an echocardiogram. Echocardiography is a painless test that uses sound waves to create images of your heart. It provides your doctor with information about the size and shape of your heart and how well your heart's chambers and valves are working. This procedure takes approximately one hour. There are no restrictions for this procedure. - DUE JUST PRIOR TO NEXT VISIT

## 2013-12-01 NOTE — Assessment & Plan Note (Signed)
Continue to follow her conservatively for now. We will get an echocardiogram for next visit to assess progression. She is very functionally active.

## 2013-12-01 NOTE — Assessment & Plan Note (Signed)
Well controlled on current regimen, no change to beta blocker dose.

## 2013-12-05 DIAGNOSIS — M25519 Pain in unspecified shoulder: Secondary | ICD-10-CM | POA: Diagnosis not present

## 2013-12-05 DIAGNOSIS — M25619 Stiffness of unspecified shoulder, not elsewhere classified: Secondary | ICD-10-CM | POA: Diagnosis not present

## 2013-12-05 DIAGNOSIS — M6281 Muscle weakness (generalized): Secondary | ICD-10-CM | POA: Diagnosis not present

## 2013-12-07 DIAGNOSIS — M25619 Stiffness of unspecified shoulder, not elsewhere classified: Secondary | ICD-10-CM | POA: Diagnosis not present

## 2013-12-07 DIAGNOSIS — M6281 Muscle weakness (generalized): Secondary | ICD-10-CM | POA: Diagnosis not present

## 2013-12-07 DIAGNOSIS — M25519 Pain in unspecified shoulder: Secondary | ICD-10-CM | POA: Diagnosis not present

## 2013-12-12 DIAGNOSIS — M25619 Stiffness of unspecified shoulder, not elsewhere classified: Secondary | ICD-10-CM | POA: Diagnosis not present

## 2013-12-12 DIAGNOSIS — M25519 Pain in unspecified shoulder: Secondary | ICD-10-CM | POA: Diagnosis not present

## 2013-12-12 DIAGNOSIS — M6281 Muscle weakness (generalized): Secondary | ICD-10-CM | POA: Diagnosis not present

## 2013-12-15 DIAGNOSIS — M6281 Muscle weakness (generalized): Secondary | ICD-10-CM | POA: Diagnosis not present

## 2013-12-15 DIAGNOSIS — M25619 Stiffness of unspecified shoulder, not elsewhere classified: Secondary | ICD-10-CM | POA: Diagnosis not present

## 2013-12-15 DIAGNOSIS — M25519 Pain in unspecified shoulder: Secondary | ICD-10-CM | POA: Diagnosis not present

## 2013-12-18 DIAGNOSIS — H353290 Exudative age-related macular degeneration, unspecified eye, stage unspecified: Secondary | ICD-10-CM | POA: Diagnosis not present

## 2013-12-18 DIAGNOSIS — H43819 Vitreous degeneration, unspecified eye: Secondary | ICD-10-CM | POA: Diagnosis not present

## 2013-12-18 DIAGNOSIS — H35369 Drusen (degenerative) of macula, unspecified eye: Secondary | ICD-10-CM | POA: Diagnosis not present

## 2013-12-18 DIAGNOSIS — H353190 Nonexudative age-related macular degeneration, unspecified eye, stage unspecified: Secondary | ICD-10-CM | POA: Diagnosis not present

## 2013-12-19 DIAGNOSIS — M25519 Pain in unspecified shoulder: Secondary | ICD-10-CM | POA: Diagnosis not present

## 2013-12-19 DIAGNOSIS — M25619 Stiffness of unspecified shoulder, not elsewhere classified: Secondary | ICD-10-CM | POA: Diagnosis not present

## 2013-12-19 DIAGNOSIS — M6281 Muscle weakness (generalized): Secondary | ICD-10-CM | POA: Diagnosis not present

## 2013-12-21 DIAGNOSIS — M25619 Stiffness of unspecified shoulder, not elsewhere classified: Secondary | ICD-10-CM | POA: Diagnosis not present

## 2013-12-21 DIAGNOSIS — M6281 Muscle weakness (generalized): Secondary | ICD-10-CM | POA: Diagnosis not present

## 2013-12-21 DIAGNOSIS — M25519 Pain in unspecified shoulder: Secondary | ICD-10-CM | POA: Diagnosis not present

## 2014-01-16 DIAGNOSIS — M25619 Stiffness of unspecified shoulder, not elsewhere classified: Secondary | ICD-10-CM | POA: Diagnosis not present

## 2014-01-16 DIAGNOSIS — M25519 Pain in unspecified shoulder: Secondary | ICD-10-CM | POA: Diagnosis not present

## 2014-01-16 DIAGNOSIS — M6281 Muscle weakness (generalized): Secondary | ICD-10-CM | POA: Diagnosis not present

## 2014-01-18 DIAGNOSIS — M25519 Pain in unspecified shoulder: Secondary | ICD-10-CM | POA: Diagnosis not present

## 2014-01-18 DIAGNOSIS — M6281 Muscle weakness (generalized): Secondary | ICD-10-CM | POA: Diagnosis not present

## 2014-01-18 DIAGNOSIS — M25619 Stiffness of unspecified shoulder, not elsewhere classified: Secondary | ICD-10-CM | POA: Diagnosis not present

## 2014-01-22 DIAGNOSIS — M25519 Pain in unspecified shoulder: Secondary | ICD-10-CM | POA: Diagnosis not present

## 2014-01-22 DIAGNOSIS — M6281 Muscle weakness (generalized): Secondary | ICD-10-CM | POA: Diagnosis not present

## 2014-01-22 DIAGNOSIS — M25619 Stiffness of unspecified shoulder, not elsewhere classified: Secondary | ICD-10-CM | POA: Diagnosis not present

## 2014-01-25 DIAGNOSIS — M25619 Stiffness of unspecified shoulder, not elsewhere classified: Secondary | ICD-10-CM | POA: Diagnosis not present

## 2014-01-25 DIAGNOSIS — M6281 Muscle weakness (generalized): Secondary | ICD-10-CM | POA: Diagnosis not present

## 2014-01-25 DIAGNOSIS — M25519 Pain in unspecified shoulder: Secondary | ICD-10-CM | POA: Diagnosis not present

## 2014-01-30 DIAGNOSIS — M25519 Pain in unspecified shoulder: Secondary | ICD-10-CM | POA: Diagnosis not present

## 2014-01-30 DIAGNOSIS — M25619 Stiffness of unspecified shoulder, not elsewhere classified: Secondary | ICD-10-CM | POA: Diagnosis not present

## 2014-01-30 DIAGNOSIS — M6281 Muscle weakness (generalized): Secondary | ICD-10-CM | POA: Diagnosis not present

## 2014-02-01 DIAGNOSIS — M25619 Stiffness of unspecified shoulder, not elsewhere classified: Secondary | ICD-10-CM | POA: Diagnosis not present

## 2014-02-01 DIAGNOSIS — M6281 Muscle weakness (generalized): Secondary | ICD-10-CM | POA: Diagnosis not present

## 2014-02-01 DIAGNOSIS — M25519 Pain in unspecified shoulder: Secondary | ICD-10-CM | POA: Diagnosis not present

## 2014-02-12 DIAGNOSIS — E876 Hypokalemia: Secondary | ICD-10-CM | POA: Diagnosis not present

## 2014-02-12 DIAGNOSIS — E782 Mixed hyperlipidemia: Secondary | ICD-10-CM | POA: Diagnosis not present

## 2014-02-12 DIAGNOSIS — I10 Essential (primary) hypertension: Secondary | ICD-10-CM | POA: Diagnosis not present

## 2014-02-12 DIAGNOSIS — E039 Hypothyroidism, unspecified: Secondary | ICD-10-CM | POA: Diagnosis not present

## 2014-02-12 DIAGNOSIS — R002 Palpitations: Secondary | ICD-10-CM | POA: Diagnosis not present

## 2014-02-12 DIAGNOSIS — M1990 Unspecified osteoarthritis, unspecified site: Secondary | ICD-10-CM | POA: Diagnosis not present

## 2014-02-19 DIAGNOSIS — E039 Hypothyroidism, unspecified: Secondary | ICD-10-CM | POA: Diagnosis not present

## 2014-02-19 DIAGNOSIS — I8390 Asymptomatic varicose veins of unspecified lower extremity: Secondary | ICD-10-CM | POA: Diagnosis not present

## 2014-02-19 DIAGNOSIS — I10 Essential (primary) hypertension: Secondary | ICD-10-CM | POA: Diagnosis not present

## 2014-02-19 DIAGNOSIS — I359 Nonrheumatic aortic valve disorder, unspecified: Secondary | ICD-10-CM | POA: Diagnosis not present

## 2014-02-19 DIAGNOSIS — E782 Mixed hyperlipidemia: Secondary | ICD-10-CM | POA: Diagnosis not present

## 2014-02-19 DIAGNOSIS — M1990 Unspecified osteoarthritis, unspecified site: Secondary | ICD-10-CM | POA: Diagnosis not present

## 2014-02-19 DIAGNOSIS — R002 Palpitations: Secondary | ICD-10-CM | POA: Diagnosis not present

## 2014-04-10 ENCOUNTER — Other Ambulatory Visit: Payer: Self-pay | Admitting: *Deleted

## 2014-04-10 MED ORDER — METOPROLOL SUCCINATE ER 25 MG PO TB24: 25.0000 mg | ORAL_TABLET | Freq: Two times a day (BID) | ORAL | Status: DC

## 2014-04-12 DIAGNOSIS — E039 Hypothyroidism, unspecified: Secondary | ICD-10-CM | POA: Diagnosis not present

## 2014-04-19 DIAGNOSIS — Z23 Encounter for immunization: Secondary | ICD-10-CM | POA: Diagnosis not present

## 2014-04-23 DIAGNOSIS — H35369 Drusen (degenerative) of macula, unspecified eye: Secondary | ICD-10-CM | POA: Diagnosis not present

## 2014-04-23 DIAGNOSIS — H353290 Exudative age-related macular degeneration, unspecified eye, stage unspecified: Secondary | ICD-10-CM | POA: Diagnosis not present

## 2014-04-23 DIAGNOSIS — H35359 Cystoid macular degeneration, unspecified eye: Secondary | ICD-10-CM | POA: Diagnosis not present

## 2014-04-24 ENCOUNTER — Other Ambulatory Visit: Payer: Self-pay | Admitting: *Deleted

## 2014-04-24 DIAGNOSIS — I359 Nonrheumatic aortic valve disorder, unspecified: Secondary | ICD-10-CM

## 2014-05-17 ENCOUNTER — Encounter: Payer: Self-pay | Admitting: *Deleted

## 2014-05-17 ENCOUNTER — Other Ambulatory Visit (INDEPENDENT_AMBULATORY_CARE_PROVIDER_SITE_OTHER): Payer: Medicare Other

## 2014-05-17 ENCOUNTER — Other Ambulatory Visit: Payer: Self-pay

## 2014-05-17 DIAGNOSIS — I471 Supraventricular tachycardia: Secondary | ICD-10-CM

## 2014-05-17 DIAGNOSIS — I348 Other nonrheumatic mitral valve disorders: Secondary | ICD-10-CM | POA: Diagnosis not present

## 2014-05-17 DIAGNOSIS — I359 Nonrheumatic aortic valve disorder, unspecified: Secondary | ICD-10-CM

## 2014-06-04 ENCOUNTER — Encounter: Payer: Self-pay | Admitting: Cardiology

## 2014-06-04 ENCOUNTER — Ambulatory Visit (INDEPENDENT_AMBULATORY_CARE_PROVIDER_SITE_OTHER): Payer: Medicare Other | Admitting: Cardiology

## 2014-06-04 VITALS — BP 123/80 | HR 70 | Ht 65.0 in | Wt 151.8 lb

## 2014-06-04 DIAGNOSIS — I471 Supraventricular tachycardia: Secondary | ICD-10-CM | POA: Diagnosis not present

## 2014-06-04 DIAGNOSIS — I359 Nonrheumatic aortic valve disorder, unspecified: Secondary | ICD-10-CM

## 2014-06-04 NOTE — Assessment & Plan Note (Signed)
Moderate to severe aortic stenosis with trivial aortic regurgitation as outlined above. She is symptomatically stable. We will follow-up in 6 months with an echocardiogram.

## 2014-06-04 NOTE — Patient Instructions (Signed)
Continue all current medications. Your physician wants you to follow up in: 6 months.  You will receive a reminder letter in the mail one-two months in advance.  If you don't receive a letter, please call our office to schedule the follow up appointment  Echo - just prior to next office visit 

## 2014-06-04 NOTE — Assessment & Plan Note (Signed)
Quiescent on current medical regimen.

## 2014-06-04 NOTE — Progress Notes (Signed)
   Reason for visit: Aortic valve disease, PSVT  Clinical Summary Christine Pugh is a 80 y.o.female last seen in May.she has had no major decline in functional capacity. Still swims, in fact did 400 yards this morning at the [facility]. She does not endorse any chest pain and has had no palpitations.  Follow-up echocardiogram done in October showed mild to moderate LVH with LVEF 14-78%, grade 1 diastolic dysfunction, mild left atrial enlargement, moderate MAC and calcified mitral leaflets with mild to moderate eccentric mitral regurgitation, moderate to severe calcific aortic stenosis with mean gradient 23 mmHg and LVOT/AV VTI ratio 0.32, trivial aortic regurgitation, trivial tricuspid regurgitation with PASP 27 mmHg.  We discussed the results of her echocardiogram, and will continue to follow expectantly at this point.   No Known Allergies  Current Outpatient Prescriptions  Medication Sig Dispense Refill  . aspirin 81 MG tablet Take 81 mg by mouth daily.      Marland Kitchen levothyroxine (SYNTHROID, LEVOTHROID) 112 MCG tablet Take 112 mcg by mouth daily before breakfast.    . Lutein-Zeaxanthin 25-5 MG CAPS Take 1 capsule by mouth 2 (two) times daily.    . metoprolol succinate (TOPROL-XL) 25 MG 24 hr tablet Take 1 tablet (25 mg total) by mouth 2 (two) times daily. 180 tablet 3  . Multiple Vitamin (MULTIVITAMIN) tablet Take 1 tablet by mouth daily.      . Red Yeast Rice 600 MG TABS Take 1 tablet by mouth daily.     Marland Kitchen triamcinolone cream (KENALOG) 0.1 % Apply 1 application topically as needed.     . triamterene-hydrochlorothiazide (DYAZIDE) 37.5-25 MG per capsule Take 1 capsule by mouth daily.      Marland Kitchen ofloxacin (OCUFLOX) 0.3 % ophthalmic solution Place 3 drops into the left eye 3 (three) times daily. Take 3 drops to left eye for 3 days after injection.     No current facility-administered medications for this visit.    Past Medical History  Diagnosis Date  . Aortic insufficiency     Mild  . Aortic stenosis       Moderate 03/2013  . PSVT (paroxysmal supraventricular tachycardia)     Social History Christine Pugh reports that she has never smoked. She has never used smokeless tobacco. Christine Pugh reports that she does not drink alcohol.  Review of Systems Complete review of systems negative except as otherwise outlined in the clinical summary.  Physical Examination Filed Vitals:   06/04/14 1604  BP: 123/80  Pulse: 70   Filed Weights   06/04/14 1604  Weight: 151 lb 12.8 oz (68.856 kg)    Normally nourished appearing elderly woman, appears comfortable at rest.  HEENT: Conjunctiva and lids normal, oropharynx clear.  Neck: Supple, no carotid bruits, no thyromegaly.  Lungs: Clear to auscultation, nonlabored.  Cardiac: Regular rate and rhythm with ectopy, 3/6 systolic murmur heard best at the base, no loud diastolic murmur, no S3.  Abdomen: Soft, nontender.  Extremities: No pitting edema, distal pulses 2+.    Problem List and Plan   Aortic valve disorder Moderate to severe aortic stenosis with trivial aortic regurgitation as outlined above. She is symptomatically stable. We will follow-up in 6 months with an echocardiogram.  SVT/ PSVT/ PAT Quiescent on current medical regimen.    Satira Sark, M.D., F.A.C.C.

## 2014-08-28 DIAGNOSIS — L309 Dermatitis, unspecified: Secondary | ICD-10-CM | POA: Diagnosis not present

## 2014-09-18 DIAGNOSIS — E039 Hypothyroidism, unspecified: Secondary | ICD-10-CM | POA: Diagnosis not present

## 2014-09-18 DIAGNOSIS — I10 Essential (primary) hypertension: Secondary | ICD-10-CM | POA: Diagnosis not present

## 2014-09-18 DIAGNOSIS — E782 Mixed hyperlipidemia: Secondary | ICD-10-CM | POA: Diagnosis not present

## 2014-09-18 DIAGNOSIS — E876 Hypokalemia: Secondary | ICD-10-CM | POA: Diagnosis not present

## 2014-09-20 DIAGNOSIS — I10 Essential (primary) hypertension: Secondary | ICD-10-CM | POA: Diagnosis not present

## 2014-09-20 DIAGNOSIS — Z0001 Encounter for general adult medical examination with abnormal findings: Secondary | ICD-10-CM | POA: Diagnosis not present

## 2014-09-20 DIAGNOSIS — Z1389 Encounter for screening for other disorder: Secondary | ICD-10-CM | POA: Diagnosis not present

## 2014-09-20 DIAGNOSIS — E039 Hypothyroidism, unspecified: Secondary | ICD-10-CM | POA: Diagnosis not present

## 2014-09-20 DIAGNOSIS — I352 Nonrheumatic aortic (valve) stenosis with insufficiency: Secondary | ICD-10-CM | POA: Diagnosis not present

## 2014-09-20 DIAGNOSIS — E782 Mixed hyperlipidemia: Secondary | ICD-10-CM | POA: Diagnosis not present

## 2014-09-20 DIAGNOSIS — Z9189 Other specified personal risk factors, not elsewhere classified: Secondary | ICD-10-CM | POA: Diagnosis not present

## 2014-09-20 DIAGNOSIS — E876 Hypokalemia: Secondary | ICD-10-CM | POA: Diagnosis not present

## 2014-09-24 ENCOUNTER — Other Ambulatory Visit: Payer: Self-pay | Admitting: *Deleted

## 2014-09-24 MED ORDER — METOPROLOL SUCCINATE ER 25 MG PO TB24: 25.0000 mg | ORAL_TABLET | Freq: Two times a day (BID) | ORAL | Status: DC

## 2014-09-24 NOTE — Telephone Encounter (Signed)
Metoprolol refilled today.  

## 2014-10-08 DIAGNOSIS — H3531 Nonexudative age-related macular degeneration: Secondary | ICD-10-CM | POA: Diagnosis not present

## 2014-10-08 DIAGNOSIS — H35362 Drusen (degenerative) of macula, left eye: Secondary | ICD-10-CM | POA: Diagnosis not present

## 2014-10-08 DIAGNOSIS — H3532 Exudative age-related macular degeneration: Secondary | ICD-10-CM | POA: Diagnosis not present

## 2014-10-18 ENCOUNTER — Other Ambulatory Visit: Payer: Self-pay | Admitting: Radiology

## 2014-10-18 DIAGNOSIS — I350 Nonrheumatic aortic (valve) stenosis: Secondary | ICD-10-CM

## 2014-11-22 ENCOUNTER — Ambulatory Visit (INDEPENDENT_AMBULATORY_CARE_PROVIDER_SITE_OTHER): Payer: Medicare Other

## 2014-11-22 DIAGNOSIS — I471 Supraventricular tachycardia: Secondary | ICD-10-CM | POA: Diagnosis not present

## 2014-11-22 DIAGNOSIS — I359 Nonrheumatic aortic valve disorder, unspecified: Secondary | ICD-10-CM | POA: Diagnosis not present

## 2014-11-22 DIAGNOSIS — I350 Nonrheumatic aortic (valve) stenosis: Secondary | ICD-10-CM

## 2014-11-29 ENCOUNTER — Ambulatory Visit (INDEPENDENT_AMBULATORY_CARE_PROVIDER_SITE_OTHER): Payer: Medicare Other | Admitting: Cardiology

## 2014-11-29 ENCOUNTER — Encounter: Payer: Self-pay | Admitting: Cardiology

## 2014-11-29 VITALS — BP 122/78 | HR 64 | Ht 65.0 in | Wt 148.1 lb

## 2014-11-29 DIAGNOSIS — I471 Supraventricular tachycardia, unspecified: Secondary | ICD-10-CM

## 2014-11-29 DIAGNOSIS — I352 Nonrheumatic aortic (valve) stenosis with insufficiency: Secondary | ICD-10-CM

## 2014-11-29 NOTE — Patient Instructions (Signed)
Your physician recommends that you continue on your current medications as directed. Please refer to the Current Medication list given to you today. Your physician has requested that you have an echocardiogram in 6 months just before your next appointment. Echocardiography is a painless test that uses sound waves to create images of your heart. It provides your doctor with information about the size and shape of your heart and how well your heart's chambers and valves are working. This procedure takes approximately one hour. There are no restrictions for this procedure. Your physician recommends that you schedule a follow-up appointment in: 6 months. You will receive a reminder letter in the mail in about 4 months reminding you to call and schedule your appointment. If you don't receive this letter, please contact our office.

## 2014-11-29 NOTE — Progress Notes (Signed)
Cardiology Office Note  Date: 11/29/2014   ID: Adria Costley, DOB Mar 22, 1922, MRN [id number]  PCP: Gar Ponto, MD  Primary Cardiologist: Rozann Lesches, MD   Chief Complaint  Patient presents with  . Aortic valve disease  . PSVT    History of Present Illness: Christine Pugh is a 81 y.o. female last seen in November 2015. She presents for a routine follow-up visit. Reports no major functional decline, still swims approximately 400 yards at the pool, 4-5 days a week. She continues on a stable medical regimen.  Recent follow-up echocardiogram is noted below, we discussed the results. Overall no major change in aortic valve gradients. We continue to follow her conservatively at this time.  Follow-up ECG shows sinus rhythm with prolonged PR interval, RSR' in lead V1 and V2 and left anterior fascicular block.  She does not endorse any significant palpitations.   Past Medical History  Diagnosis Date  . Aortic insufficiency     Mild  . Aortic stenosis     Moderate 03/2013  . PSVT (paroxysmal supraventricular tachycardia)     Past Surgical History  Procedure Laterality Date  . Thyroidectomy    . Total abdominal hysterectomy    . Appendectomy    . Tonsillectomy      Current Outpatient Prescriptions  Medication Sig Dispense Refill  . aspirin 81 MG tablet Take 81 mg by mouth daily.      Marland Kitchen levothyroxine (SYNTHROID, LEVOTHROID) 112 MCG tablet Take 112 mcg by mouth daily before breakfast.    . Lutein-Zeaxanthin 25-5 MG CAPS Take 1 capsule by mouth 2 (two) times daily.    . metoprolol succinate (TOPROL-XL) 25 MG 24 hr tablet Take 1 tablet (25 mg total) by mouth 2 (two) times daily. 180 tablet 3  . Multiple Vitamin (MULTIVITAMIN) tablet Take 1 tablet by mouth daily.      Marland Kitchen ofloxacin (OCUFLOX) 0.3 % ophthalmic solution Place 3 drops into the left eye 3 (three) times daily. Take 3 drops to left eye for 3 days after injection.    . Red Yeast Rice 600 MG TABS  Take 1 tablet by mouth daily.     Marland Kitchen triamcinolone cream (KENALOG) 0.1 % Apply 1 application topically as needed.     . triamterene-hydrochlorothiazide (DYAZIDE) 37.5-25 MG per capsule Take 1 capsule by mouth daily.       No current facility-administered medications for this visit.    Allergies:  Review of patient's allergies indicates no known allergies.   Social History: The patient  reports that she has never smoked. She has never used smokeless tobacco. She reports that she does not drink alcohol or use illicit drugs.   ROS:  Please see the history of present illness. Otherwise, complete review of systems is positive for occasional left ankle edema.  All other systems are reviewed and negative.   Physical Exam: VS:  BP 122/78 mmHg  Pulse 64  Ht 5\' 5"  (1.651 m)  Wt 148 lb 1.9 oz (67.187 kg)  BMI 24.65 kg/m2  SpO2 97%, BMI Body mass index is 24.65 kg/(m^2).  Wt Readings from Last 3 Encounters:  11/29/14 148 lb 1.9 oz (67.187 kg)  06/04/14 151 lb 12.8 oz (68.856 kg)  12/01/13 150 lb (68.04 kg)     Normally nourished appearing elderly woman, appears comfortable at rest.  HEENT: Conjunctiva and lids normal, oropharynx clear.  Neck: Supple, no carotid bruits, no thyromegaly.  Lungs: Clear to auscultation, nonlabored.  Cardiac: Regular rate and rhythm  with ectopy, 3/6 systolic murmur heard best at the base, no loud diastolic murmur, no S3.  Abdomen: Soft, nontender.  Extremities: No pitting edema, distal pulses 2+.    ECG: ECG is ordered today.   Other Studies Reviewed Today:  Echocardiogram 11/22/2014 showed mild LVH with LVEF 60%, calcific aortic stenosis of moderate to severe degree with trivial aortic regurgitation, mean gradient 23 mmHg, peak gradient 38 mmHg, mild to moderate ascending aortic dilatation, mild to moderate left atrial enlargement, mild right atrial enlargement.   Assessment and Plan:  1. Moderate to severe aortic valve stenosis with stable  gradients by recent follow-up study. No major decline in functional capacity. We will continue observation, follow-up echocardiogram in 6 months.  2. History of PSVT, well-controlled at this time.  Current medicines were reviewed with the patient today.   Orders Placed This Encounter  Procedures  . EKG 12-Lead    Disposition: FU with me in 6 months.   Signed, Satira Sark, MD, [facility] 11/29/2014 2:43 PM    [facility] [place], [place], [place] [address] Phone: [phone]; Fax: [phone]

## 2015-01-28 ENCOUNTER — Other Ambulatory Visit: Payer: Self-pay

## 2015-03-12 DIAGNOSIS — E039 Hypothyroidism, unspecified: Secondary | ICD-10-CM | POA: Diagnosis not present

## 2015-03-19 DIAGNOSIS — I8391 Asymptomatic varicose veins of right lower extremity: Secondary | ICD-10-CM | POA: Diagnosis not present

## 2015-03-19 DIAGNOSIS — I350 Nonrheumatic aortic (valve) stenosis: Secondary | ICD-10-CM | POA: Diagnosis not present

## 2015-03-19 DIAGNOSIS — M545 Low back pain: Secondary | ICD-10-CM | POA: Diagnosis not present

## 2015-03-19 DIAGNOSIS — E782 Mixed hyperlipidemia: Secondary | ICD-10-CM | POA: Diagnosis not present

## 2015-03-19 DIAGNOSIS — I10 Essential (primary) hypertension: Secondary | ICD-10-CM | POA: Diagnosis not present

## 2015-03-19 DIAGNOSIS — E039 Hypothyroidism, unspecified: Secondary | ICD-10-CM | POA: Diagnosis not present

## 2015-04-09 DIAGNOSIS — Z23 Encounter for immunization: Secondary | ICD-10-CM | POA: Diagnosis not present

## 2015-04-09 DIAGNOSIS — S8002XA Contusion of left knee, initial encounter: Secondary | ICD-10-CM | POA: Diagnosis not present

## 2015-04-09 DIAGNOSIS — R55 Syncope and collapse: Secondary | ICD-10-CM | POA: Diagnosis not present

## 2015-04-09 DIAGNOSIS — S82032A Displaced transverse fracture of left patella, initial encounter for closed fracture: Secondary | ICD-10-CM | POA: Diagnosis not present

## 2015-04-15 DIAGNOSIS — S82032D Displaced transverse fracture of left patella, subsequent encounter for closed fracture with routine healing: Secondary | ICD-10-CM | POA: Diagnosis not present

## 2015-04-15 DIAGNOSIS — M1990 Unspecified osteoarthritis, unspecified site: Secondary | ICD-10-CM | POA: Diagnosis not present

## 2015-04-15 DIAGNOSIS — R55 Syncope and collapse: Secondary | ICD-10-CM | POA: Diagnosis not present

## 2015-04-15 DIAGNOSIS — I350 Nonrheumatic aortic (valve) stenosis: Secondary | ICD-10-CM | POA: Diagnosis not present

## 2015-04-15 DIAGNOSIS — S8002XD Contusion of left knee, subsequent encounter: Secondary | ICD-10-CM | POA: Diagnosis not present

## 2015-04-15 DIAGNOSIS — M545 Low back pain: Secondary | ICD-10-CM | POA: Diagnosis not present

## 2015-04-15 DIAGNOSIS — I8391 Asymptomatic varicose veins of right lower extremity: Secondary | ICD-10-CM | POA: Diagnosis not present

## 2015-04-15 DIAGNOSIS — I10 Essential (primary) hypertension: Secondary | ICD-10-CM | POA: Diagnosis not present

## 2015-04-16 DIAGNOSIS — M25562 Pain in left knee: Secondary | ICD-10-CM | POA: Diagnosis not present

## 2015-04-16 DIAGNOSIS — S82032A Displaced transverse fracture of left patella, initial encounter for closed fracture: Secondary | ICD-10-CM | POA: Diagnosis not present

## 2015-04-18 DIAGNOSIS — R55 Syncope and collapse: Secondary | ICD-10-CM | POA: Diagnosis not present

## 2015-04-18 DIAGNOSIS — S8002XD Contusion of left knee, subsequent encounter: Secondary | ICD-10-CM | POA: Diagnosis not present

## 2015-04-18 DIAGNOSIS — S82032D Displaced transverse fracture of left patella, subsequent encounter for closed fracture with routine healing: Secondary | ICD-10-CM | POA: Diagnosis not present

## 2015-04-18 DIAGNOSIS — I350 Nonrheumatic aortic (valve) stenosis: Secondary | ICD-10-CM | POA: Diagnosis not present

## 2015-04-18 DIAGNOSIS — I8391 Asymptomatic varicose veins of right lower extremity: Secondary | ICD-10-CM | POA: Diagnosis not present

## 2015-04-18 DIAGNOSIS — I10 Essential (primary) hypertension: Secondary | ICD-10-CM | POA: Diagnosis not present

## 2015-04-20 DIAGNOSIS — I8391 Asymptomatic varicose veins of right lower extremity: Secondary | ICD-10-CM | POA: Diagnosis not present

## 2015-04-20 DIAGNOSIS — S8002XD Contusion of left knee, subsequent encounter: Secondary | ICD-10-CM | POA: Diagnosis not present

## 2015-04-20 DIAGNOSIS — I350 Nonrheumatic aortic (valve) stenosis: Secondary | ICD-10-CM | POA: Diagnosis not present

## 2015-04-20 DIAGNOSIS — I10 Essential (primary) hypertension: Secondary | ICD-10-CM | POA: Diagnosis not present

## 2015-04-20 DIAGNOSIS — R55 Syncope and collapse: Secondary | ICD-10-CM | POA: Diagnosis not present

## 2015-04-20 DIAGNOSIS — S82032D Displaced transverse fracture of left patella, subsequent encounter for closed fracture with routine healing: Secondary | ICD-10-CM | POA: Diagnosis not present

## 2015-04-22 DIAGNOSIS — R55 Syncope and collapse: Secondary | ICD-10-CM | POA: Diagnosis not present

## 2015-04-22 DIAGNOSIS — I350 Nonrheumatic aortic (valve) stenosis: Secondary | ICD-10-CM | POA: Diagnosis not present

## 2015-04-22 DIAGNOSIS — S82032D Displaced transverse fracture of left patella, subsequent encounter for closed fracture with routine healing: Secondary | ICD-10-CM | POA: Diagnosis not present

## 2015-04-22 DIAGNOSIS — I8391 Asymptomatic varicose veins of right lower extremity: Secondary | ICD-10-CM | POA: Diagnosis not present

## 2015-04-22 DIAGNOSIS — S8002XD Contusion of left knee, subsequent encounter: Secondary | ICD-10-CM | POA: Diagnosis not present

## 2015-04-22 DIAGNOSIS — I10 Essential (primary) hypertension: Secondary | ICD-10-CM | POA: Diagnosis not present

## 2015-04-24 DIAGNOSIS — I10 Essential (primary) hypertension: Secondary | ICD-10-CM | POA: Diagnosis not present

## 2015-04-24 DIAGNOSIS — I350 Nonrheumatic aortic (valve) stenosis: Secondary | ICD-10-CM | POA: Diagnosis not present

## 2015-04-24 DIAGNOSIS — R55 Syncope and collapse: Secondary | ICD-10-CM | POA: Diagnosis not present

## 2015-04-24 DIAGNOSIS — S8002XD Contusion of left knee, subsequent encounter: Secondary | ICD-10-CM | POA: Diagnosis not present

## 2015-04-24 DIAGNOSIS — I8391 Asymptomatic varicose veins of right lower extremity: Secondary | ICD-10-CM | POA: Diagnosis not present

## 2015-04-24 DIAGNOSIS — S82032D Displaced transverse fracture of left patella, subsequent encounter for closed fracture with routine healing: Secondary | ICD-10-CM | POA: Diagnosis not present

## 2015-04-25 ENCOUNTER — Other Ambulatory Visit: Payer: Self-pay | Admitting: Cardiology

## 2015-04-25 DIAGNOSIS — I359 Nonrheumatic aortic valve disorder, unspecified: Secondary | ICD-10-CM

## 2015-04-26 ENCOUNTER — Ambulatory Visit (INDEPENDENT_AMBULATORY_CARE_PROVIDER_SITE_OTHER): Payer: Medicare Other | Admitting: Cardiology

## 2015-04-26 ENCOUNTER — Encounter: Payer: Self-pay | Admitting: Cardiology

## 2015-04-26 VITALS — BP 116/80 | HR 69 | Ht 64.0 in | Wt 147.0 lb

## 2015-04-26 DIAGNOSIS — I471 Supraventricular tachycardia: Secondary | ICD-10-CM

## 2015-04-26 DIAGNOSIS — S82032D Displaced transverse fracture of left patella, subsequent encounter for closed fracture with routine healing: Secondary | ICD-10-CM | POA: Diagnosis not present

## 2015-04-26 DIAGNOSIS — I352 Nonrheumatic aortic (valve) stenosis with insufficiency: Secondary | ICD-10-CM | POA: Diagnosis not present

## 2015-04-26 DIAGNOSIS — I10 Essential (primary) hypertension: Secondary | ICD-10-CM | POA: Diagnosis not present

## 2015-04-26 DIAGNOSIS — R55 Syncope and collapse: Secondary | ICD-10-CM

## 2015-04-26 DIAGNOSIS — I8391 Asymptomatic varicose veins of right lower extremity: Secondary | ICD-10-CM | POA: Diagnosis not present

## 2015-04-26 DIAGNOSIS — I350 Nonrheumatic aortic (valve) stenosis: Secondary | ICD-10-CM | POA: Diagnosis not present

## 2015-04-26 DIAGNOSIS — S8002XD Contusion of left knee, subsequent encounter: Secondary | ICD-10-CM | POA: Diagnosis not present

## 2015-04-26 NOTE — Patient Instructions (Signed)
Your physician recommends that you continue on your current medications as directed. Please refer to the Current Medication list given to you today. Your physician has recommended that you wear an event monitor for 7 days. Event monitors are medical devices that record the heart's electrical activity. Doctors most often [place] these monitors to diagnose arrhythmias. Arrhythmias are problems with the speed or rhythm of the heartbeat. The monitor is a small, portable device. You can wear one while you do your normal daily activities. This is usually used to diagnose what is causing palpitations/syncope (passing out). Your physician recommends that you schedule a follow-up appointment in: 1 month.

## 2015-04-26 NOTE — Progress Notes (Signed)
Cardiology Office Note  Date: 04/26/2015   ID: Christine Pugh, DOB [birth year], MRN [id number]  PCP: Gar Ponto, MD  Primary Cardiologist: Rozann Lesches, MD   Chief Complaint  Patient presents with  . Aortic valve disease  . History of PSVT  . Loss of Consciousness    History of Present Illness: Christine Pugh is a 81 y.o. female last seen in April. She is here today with her son and an Environmental consultant. Back around Labor Day she was out for a walk, felt fine without any specific limitations, no chest pain, palpitations, or shortness of breath. She had a sudden episode of syncope and only remembers waking up on the ground. She got up and walked back home, later had further pain in her leg, and was subsequently diagnosed with a fractured left patella, currently in a leg brace in wheelchair. It is not entirely clear that she will need any surgery.  She has had no episodes similar to this, does not feel dizziness or palpitations. Her history includes PSVT for which she takes Toprol-XL. This has been well controlled over the last several years. She also has moderate to severe aortic stenosis, however does not have progressing functional limitations, prior to her syncopal event she was still swimming regularly at the [facility] as we have discussed in previous notes.  We discussed her symptoms today, the event in question sounds like it could have been arrhythmogenic, not necessarily related to change in aortic stenosis. She could have had either a rapid or slow heart rhythm with associated drop in blood pressure, and in the setting of aortic stenosis, subsequent syncope.   Past Medical History  Diagnosis Date  . Aortic insufficiency     Mild  . Aortic stenosis     Moderate 03/2013  . PSVT (paroxysmal supraventricular tachycardia)     Past Surgical History  Procedure Laterality Date  . Thyroidectomy    . Total abdominal hysterectomy    . Appendectomy    . Tonsillectomy        Current Outpatient Prescriptions  Medication Sig Dispense Refill  . aspirin 81 MG tablet Take 81 mg by mouth daily.      Marland Kitchen levothyroxine (SYNTHROID, LEVOTHROID) 112 MCG tablet Take 112 mcg by mouth daily before breakfast.    . Lutein-Zeaxanthin 25-5 MG CAPS Take 1 capsule by mouth 2 (two) times daily.    . metoprolol succinate (TOPROL-XL) 25 MG 24 hr tablet Take 1 tablet (25 mg total) by mouth 2 (two) times daily. 180 tablet 3  . Multiple Vitamin (MULTIVITAMIN) tablet Take 1 tablet by mouth daily.      Marland Kitchen ofloxacin (OCUFLOX) 0.3 % ophthalmic solution Place 3 drops into the left eye 3 (three) times daily. Take 3 drops to left eye for 3 days after injection.    . Red Yeast Rice 600 MG TABS Take 1 tablet by mouth daily.     Marland Kitchen triamcinolone cream (KENALOG) 0.1 % Apply 1 application topically as needed.     . triamterene-hydrochlorothiazide (DYAZIDE) 37.5-25 MG per capsule Take 1 capsule by mouth daily.       No current facility-administered medications for this visit.    Allergies:  Review of patient's allergies indicates no known allergies.   Social History: The patient  reports that she has never smoked. She has never used smokeless tobacco. She reports that she does not drink alcohol or use illicit drugs.   ROS:  Please see the history of present illness. Otherwise,  complete review of systems is positive for none.  All other systems are reviewed and negative.   Physical Exam: VS:  BP 116/80 mmHg  Pulse 69  Ht 5\' 4"  (1.626 m)  Wt 147 lb (66.679 kg)  BMI 25.22 kg/m2  SpO2 98%, BMI Body mass index is 25.22 kg/(m^2).  Wt Readings from Last 3 Encounters:  04/26/15 147 lb (66.679 kg)  11/29/14 148 lb 1.9 oz (67.187 kg)  06/04/14 151 lb 12.8 oz (68.856 kg)     Normally nourished appearing elderly woman, appears comfortable at rest. In wheelchair. HEENT: Conjunctiva and lids normal, oropharynx clear.  Neck: Supple, no carotid bruits, no thyromegaly.  Lungs: Clear to auscultation,  nonlabored.  Cardiac: Regular rate and rhythm with ectopy, 3/6 systolic murmur heard best at the base, no loud diastolic murmur, no S3.  Abdomen: Soft, nontender.  Extremities: Left leg in brace, distal pulses 2+.    ECG: ECG is ordered today and shows sinus rhythm with PAC, low voltage and decreased R wave progression.  Other Studies Reviewed Today:  Echocardiogram 11/22/2014 showed mild LVH with LVEF 60%, calcific aortic stenosis of moderate to severe degree with trivial aortic regurgitation, mean gradient 23 mmHg, peak gradient 38 mmHg, mild to moderate ascending aortic dilatation, mild to moderate left atrial enlargement, mild right atrial enlargement.  Assessment and Plan:  1. Episode of syncope as outlined above, suspect arrhythmogenic etiology based on description. ECG today reviewed. She has a known history of PSVT that has been well controlled on Toprol-XL. Would also have concerns about bradycardia or pauses in light of aortic stenosis and age. We will place a 7 day event monitor to further assess heart rate. variability, although at this point she is not having any further symptoms, specifically no palpitations, no dizziness or lightheadedness.  2. Moderate to severe calcific aortic stenosis. She is due for a follow-up echocardiogram within the next few weeks.  Current medicines were reviewed with the patient today.   Orders Placed This Encounter  Procedures  . Cardiac event monitor  . EKG 12-Lead    Disposition: FU with me in 1 month.   Signed, Satira Sark, MD, [facility] 04/26/2015 10:50 AM    [facility] [place], [place], [place] [address] Phone: [phone]; Fax: [phone]

## 2015-04-29 DIAGNOSIS — R55 Syncope and collapse: Secondary | ICD-10-CM | POA: Diagnosis not present

## 2015-04-30 ENCOUNTER — Ambulatory Visit (INDEPENDENT_AMBULATORY_CARE_PROVIDER_SITE_OTHER): Payer: Medicare Other

## 2015-04-30 DIAGNOSIS — I471 Supraventricular tachycardia: Secondary | ICD-10-CM

## 2015-04-30 DIAGNOSIS — S82032A Displaced transverse fracture of left patella, initial encounter for closed fracture: Secondary | ICD-10-CM | POA: Diagnosis not present

## 2015-04-30 DIAGNOSIS — R55 Syncope and collapse: Secondary | ICD-10-CM | POA: Diagnosis not present

## 2015-05-01 DIAGNOSIS — S8002XD Contusion of left knee, subsequent encounter: Secondary | ICD-10-CM | POA: Diagnosis not present

## 2015-05-01 DIAGNOSIS — I350 Nonrheumatic aortic (valve) stenosis: Secondary | ICD-10-CM | POA: Diagnosis not present

## 2015-05-01 DIAGNOSIS — R55 Syncope and collapse: Secondary | ICD-10-CM | POA: Diagnosis not present

## 2015-05-01 DIAGNOSIS — S82032D Displaced transverse fracture of left patella, subsequent encounter for closed fracture with routine healing: Secondary | ICD-10-CM | POA: Diagnosis not present

## 2015-05-01 DIAGNOSIS — I10 Essential (primary) hypertension: Secondary | ICD-10-CM | POA: Diagnosis not present

## 2015-05-01 DIAGNOSIS — I8391 Asymptomatic varicose veins of right lower extremity: Secondary | ICD-10-CM | POA: Diagnosis not present

## 2015-05-02 DIAGNOSIS — I8391 Asymptomatic varicose veins of right lower extremity: Secondary | ICD-10-CM | POA: Diagnosis not present

## 2015-05-02 DIAGNOSIS — S8002XD Contusion of left knee, subsequent encounter: Secondary | ICD-10-CM | POA: Diagnosis not present

## 2015-05-02 DIAGNOSIS — R55 Syncope and collapse: Secondary | ICD-10-CM | POA: Diagnosis not present

## 2015-05-02 DIAGNOSIS — I350 Nonrheumatic aortic (valve) stenosis: Secondary | ICD-10-CM | POA: Diagnosis not present

## 2015-05-02 DIAGNOSIS — S82032D Displaced transverse fracture of left patella, subsequent encounter for closed fracture with routine healing: Secondary | ICD-10-CM | POA: Diagnosis not present

## 2015-05-02 DIAGNOSIS — I10 Essential (primary) hypertension: Secondary | ICD-10-CM | POA: Diagnosis not present

## 2015-05-07 DIAGNOSIS — I10 Essential (primary) hypertension: Secondary | ICD-10-CM | POA: Diagnosis not present

## 2015-05-07 DIAGNOSIS — S8002XD Contusion of left knee, subsequent encounter: Secondary | ICD-10-CM | POA: Diagnosis not present

## 2015-05-07 DIAGNOSIS — I8391 Asymptomatic varicose veins of right lower extremity: Secondary | ICD-10-CM | POA: Diagnosis not present

## 2015-05-07 DIAGNOSIS — R55 Syncope and collapse: Secondary | ICD-10-CM | POA: Diagnosis not present

## 2015-05-07 DIAGNOSIS — I350 Nonrheumatic aortic (valve) stenosis: Secondary | ICD-10-CM | POA: Diagnosis not present

## 2015-05-07 DIAGNOSIS — S82032D Displaced transverse fracture of left patella, subsequent encounter for closed fracture with routine healing: Secondary | ICD-10-CM | POA: Diagnosis not present

## 2015-05-08 ENCOUNTER — Other Ambulatory Visit: Payer: Self-pay

## 2015-05-08 ENCOUNTER — Ambulatory Visit (INDEPENDENT_AMBULATORY_CARE_PROVIDER_SITE_OTHER): Payer: Medicare Other

## 2015-05-08 DIAGNOSIS — I10 Essential (primary) hypertension: Secondary | ICD-10-CM | POA: Diagnosis not present

## 2015-05-08 DIAGNOSIS — I350 Nonrheumatic aortic (valve) stenosis: Secondary | ICD-10-CM | POA: Diagnosis not present

## 2015-05-08 DIAGNOSIS — S8002XD Contusion of left knee, subsequent encounter: Secondary | ICD-10-CM | POA: Diagnosis not present

## 2015-05-08 DIAGNOSIS — I8391 Asymptomatic varicose veins of right lower extremity: Secondary | ICD-10-CM | POA: Diagnosis not present

## 2015-05-08 DIAGNOSIS — Z23 Encounter for immunization: Secondary | ICD-10-CM | POA: Diagnosis not present

## 2015-05-08 DIAGNOSIS — I359 Nonrheumatic aortic valve disorder, unspecified: Secondary | ICD-10-CM | POA: Diagnosis not present

## 2015-05-08 DIAGNOSIS — S82032D Displaced transverse fracture of left patella, subsequent encounter for closed fracture with routine healing: Secondary | ICD-10-CM | POA: Diagnosis not present

## 2015-05-08 DIAGNOSIS — R55 Syncope and collapse: Secondary | ICD-10-CM | POA: Diagnosis not present

## 2015-05-09 ENCOUNTER — Telehealth: Payer: Self-pay | Admitting: *Deleted

## 2015-05-09 DIAGNOSIS — R55 Syncope and collapse: Secondary | ICD-10-CM | POA: Diagnosis not present

## 2015-05-09 DIAGNOSIS — I8391 Asymptomatic varicose veins of right lower extremity: Secondary | ICD-10-CM | POA: Diagnosis not present

## 2015-05-09 DIAGNOSIS — S82032D Displaced transverse fracture of left patella, subsequent encounter for closed fracture with routine healing: Secondary | ICD-10-CM | POA: Diagnosis not present

## 2015-05-09 DIAGNOSIS — S8002XD Contusion of left knee, subsequent encounter: Secondary | ICD-10-CM | POA: Diagnosis not present

## 2015-05-09 DIAGNOSIS — I10 Essential (primary) hypertension: Secondary | ICD-10-CM | POA: Diagnosis not present

## 2015-05-09 DIAGNOSIS — I350 Nonrheumatic aortic (valve) stenosis: Secondary | ICD-10-CM | POA: Diagnosis not present

## 2015-05-09 NOTE — Telephone Encounter (Signed)
-----   Message from Satira Sark, MD sent at 05/07/2015  4:30 PM EDT ----- Reviewed. Please let her know that the monitor did not show any significant bradyarrhythmias or pauses to explain her episode of syncope. Also no PSVT.

## 2015-05-09 NOTE — Telephone Encounter (Signed)
Patient informed. 

## 2015-05-09 NOTE — Telephone Encounter (Signed)
-----   Message from Satira Sark, MD sent at 05/08/2015  4:05 PM EDT ----- Reviewed. Let her know that LVEF remains in normal range. Aortic stenosis is still in the moderate to severe range , however closer to the severe range with increased mean gradient since last study of 29 mm. I still do not think that this explains her episode of syncope recently. Ascending aorta is dilated as noted previously as well. We will continue to follow her in clinic and decide how aggressively we want to pursue this.

## 2015-05-14 DIAGNOSIS — I10 Essential (primary) hypertension: Secondary | ICD-10-CM | POA: Diagnosis not present

## 2015-05-14 DIAGNOSIS — I8391 Asymptomatic varicose veins of right lower extremity: Secondary | ICD-10-CM | POA: Diagnosis not present

## 2015-05-14 DIAGNOSIS — S82032D Displaced transverse fracture of left patella, subsequent encounter for closed fracture with routine healing: Secondary | ICD-10-CM | POA: Diagnosis not present

## 2015-05-14 DIAGNOSIS — I350 Nonrheumatic aortic (valve) stenosis: Secondary | ICD-10-CM | POA: Diagnosis not present

## 2015-05-14 DIAGNOSIS — S8002XD Contusion of left knee, subsequent encounter: Secondary | ICD-10-CM | POA: Diagnosis not present

## 2015-05-14 DIAGNOSIS — R55 Syncope and collapse: Secondary | ICD-10-CM | POA: Diagnosis not present

## 2015-05-16 DIAGNOSIS — S82032A Displaced transverse fracture of left patella, initial encounter for closed fracture: Secondary | ICD-10-CM | POA: Diagnosis not present

## 2015-05-17 DIAGNOSIS — I8391 Asymptomatic varicose veins of right lower extremity: Secondary | ICD-10-CM | POA: Diagnosis not present

## 2015-05-17 DIAGNOSIS — S8002XD Contusion of left knee, subsequent encounter: Secondary | ICD-10-CM | POA: Diagnosis not present

## 2015-05-17 DIAGNOSIS — S82032D Displaced transverse fracture of left patella, subsequent encounter for closed fracture with routine healing: Secondary | ICD-10-CM | POA: Diagnosis not present

## 2015-05-17 DIAGNOSIS — R55 Syncope and collapse: Secondary | ICD-10-CM | POA: Diagnosis not present

## 2015-05-17 DIAGNOSIS — I10 Essential (primary) hypertension: Secondary | ICD-10-CM | POA: Diagnosis not present

## 2015-05-17 DIAGNOSIS — I350 Nonrheumatic aortic (valve) stenosis: Secondary | ICD-10-CM | POA: Diagnosis not present

## 2015-05-20 DIAGNOSIS — I8391 Asymptomatic varicose veins of right lower extremity: Secondary | ICD-10-CM | POA: Diagnosis not present

## 2015-05-20 DIAGNOSIS — S8002XD Contusion of left knee, subsequent encounter: Secondary | ICD-10-CM | POA: Diagnosis not present

## 2015-05-20 DIAGNOSIS — S82032D Displaced transverse fracture of left patella, subsequent encounter for closed fracture with routine healing: Secondary | ICD-10-CM | POA: Diagnosis not present

## 2015-05-20 DIAGNOSIS — I350 Nonrheumatic aortic (valve) stenosis: Secondary | ICD-10-CM | POA: Diagnosis not present

## 2015-05-20 DIAGNOSIS — I10 Essential (primary) hypertension: Secondary | ICD-10-CM | POA: Diagnosis not present

## 2015-05-20 DIAGNOSIS — R55 Syncope and collapse: Secondary | ICD-10-CM | POA: Diagnosis not present

## 2015-05-22 DIAGNOSIS — R55 Syncope and collapse: Secondary | ICD-10-CM | POA: Diagnosis not present

## 2015-05-22 DIAGNOSIS — S8002XD Contusion of left knee, subsequent encounter: Secondary | ICD-10-CM | POA: Diagnosis not present

## 2015-05-22 DIAGNOSIS — S82032D Displaced transverse fracture of left patella, subsequent encounter for closed fracture with routine healing: Secondary | ICD-10-CM | POA: Diagnosis not present

## 2015-05-22 DIAGNOSIS — I8391 Asymptomatic varicose veins of right lower extremity: Secondary | ICD-10-CM | POA: Diagnosis not present

## 2015-05-22 DIAGNOSIS — I350 Nonrheumatic aortic (valve) stenosis: Secondary | ICD-10-CM | POA: Diagnosis not present

## 2015-05-22 DIAGNOSIS — I10 Essential (primary) hypertension: Secondary | ICD-10-CM | POA: Diagnosis not present

## 2015-05-27 DIAGNOSIS — S82032D Displaced transverse fracture of left patella, subsequent encounter for closed fracture with routine healing: Secondary | ICD-10-CM | POA: Diagnosis not present

## 2015-05-27 DIAGNOSIS — S8002XD Contusion of left knee, subsequent encounter: Secondary | ICD-10-CM | POA: Diagnosis not present

## 2015-05-27 DIAGNOSIS — I350 Nonrheumatic aortic (valve) stenosis: Secondary | ICD-10-CM | POA: Diagnosis not present

## 2015-05-27 DIAGNOSIS — I10 Essential (primary) hypertension: Secondary | ICD-10-CM | POA: Diagnosis not present

## 2015-05-27 DIAGNOSIS — R55 Syncope and collapse: Secondary | ICD-10-CM | POA: Diagnosis not present

## 2015-05-27 DIAGNOSIS — I8391 Asymptomatic varicose veins of right lower extremity: Secondary | ICD-10-CM | POA: Diagnosis not present

## 2015-05-28 ENCOUNTER — Encounter: Payer: Self-pay | Admitting: Cardiology

## 2015-05-28 ENCOUNTER — Ambulatory Visit: Payer: PRIVATE HEALTH INSURANCE | Admitting: Cardiology

## 2015-05-28 ENCOUNTER — Ambulatory Visit (INDEPENDENT_AMBULATORY_CARE_PROVIDER_SITE_OTHER): Payer: Medicare Other | Admitting: Cardiology

## 2015-05-28 VITALS — BP 134/80 | HR 74 | Ht 64.0 in | Wt 145.0 lb

## 2015-05-28 DIAGNOSIS — I471 Supraventricular tachycardia: Secondary | ICD-10-CM | POA: Diagnosis not present

## 2015-05-28 DIAGNOSIS — I359 Nonrheumatic aortic valve disorder, unspecified: Secondary | ICD-10-CM | POA: Diagnosis not present

## 2015-05-28 DIAGNOSIS — I352 Nonrheumatic aortic (valve) stenosis with insufficiency: Secondary | ICD-10-CM

## 2015-05-28 DIAGNOSIS — R55 Syncope and collapse: Secondary | ICD-10-CM

## 2015-05-28 NOTE — Patient Instructions (Signed)
Your physician recommends that you continue on your current medications as directed. Please refer to the Current Medication list given to you today. You have been referred to Dr. Burt Knack. Your physician recommends that you schedule a follow-up appointment in: 2 months.

## 2015-05-28 NOTE — Progress Notes (Signed)
Cardiology Office Note  Date: 05/28/2015   ID: Christine Pugh, DOB 08-21-1921, MRN [id number]  PCP: Gar Ponto, MD  Primary Cardiologist: Rozann Lesches, MD   Chief Complaint  Patient presents with  . Aortic valve disease  . Follow-up syncope    History of Present Illness: Christine Pugh is a 81 y.o. female last seen in September following an episode of sudden syncope which by description sounded arrhythmogenic rather than clearly related to her aortic stenosis. We did pursue follow-up cardiac monitoring which did not demonstrate any significant bradyarrhythmias or pauses, also no PSVT. Echocardiogram continues to show normal LVEF and moderate to severe aortic stenosis (closer to severe range). Mean gradient has increased over time now up to 29 mmHg.  She comes in today with 2 family members for further discussion. She is wearing a brace on her left lower leg, follows up with her orthopedist today. She reports improved ambulation. She has been less active since her fall, but does not endorse any definitive decline in stamina. As noted previously, she has been active, is mentally clear, and when at baseline was swimming regularly at the [facility] [employer], has competed in the senior games.  Today we had a long discussion about what to do with her aortic valve disease. We have been observing her conservatively over time, and she has generally done well. We have discussed the possibility of TAVR evaluation as her valve has gotten closer to severe stenosis range, since she has been relatively functional. This is certainly not something that I have pushed, but based on overall discussion today, and wishes of the patient and her family members, we plan to have her evaluated by Dr. Burt Knack to at least discuss her options.   Past Medical History  Diagnosis Date  . Aortic insufficiency     Mild  . Aortic stenosis     Moderate 03/2013  . PSVT (paroxysmal supraventricular tachycardia)  [facility])     Past Surgical History  Procedure Laterality Date  . Thyroidectomy    . Total abdominal hysterectomy    . Appendectomy    . Tonsillectomy      Current Outpatient Prescriptions  Medication Sig Dispense Refill  . aspirin 81 MG tablet Take 81 mg by mouth daily.      Marland Kitchen levothyroxine (SYNTHROID, LEVOTHROID) 112 MCG tablet Take 112 mcg by mouth daily before breakfast.    . Lutein-Zeaxanthin 25-5 MG CAPS Take 1 capsule by mouth 2 (two) times daily.    . metoprolol succinate (TOPROL-XL) 25 MG 24 hr tablet Take 1 tablet (25 mg total) by mouth 2 (two) times daily. 180 tablet 3  . Multiple Vitamin (MULTIVITAMIN) tablet Take 1 tablet by mouth daily.      Marland Kitchen ofloxacin (OCUFLOX) 0.3 % ophthalmic solution Place 3 drops into the left eye 3 (three) times daily. Take 3 drops to left eye for 3 days after injection.    . Red Yeast Rice 600 MG TABS Take 1 tablet by mouth daily.     Marland Kitchen triamcinolone cream (KENALOG) 0.1 % Apply 1 application topically as needed.     . triamterene-hydrochlorothiazide (DYAZIDE) 37.5-25 MG per capsule Take 1 capsule by mouth daily.       No current facility-administered medications for this visit.    Allergies:  Review of patient's allergies indicates no known allergies.   Social History: The patient  reports that she has never smoked. She has never used smokeless tobacco. She reports that she does not drink alcohol  or use illicit drugs.   ROS:  Please see the history of present illness. Otherwise, complete review of systems is positive for intermittent shortness of breath NYHA class 2-3 with activities, resolves quickly with rest.  All other systems are reviewed and negative.   Physical Exam: VS:  BP 134/80 mmHg  Pulse 74  Ht 5\' 4"  (1.626 m)  Wt 145 lb (65.772 kg)  BMI 24.88 kg/m2  SpO2 97%, BMI Body mass index is 24.88 kg/(m^2).  Wt Readings from Last 3 Encounters:  05/28/15 145 lb (65.772 kg)  04/26/15 147 lb (66.679 kg)  11/29/14 148 lb 1.9 oz (67.187 kg)      Normally nourished appearing elderly woman, appears comfortable at rest. Using walker. HEENT: Conjunctiva and lids normal, oropharynx clear.  Neck: Supple, no carotid bruits, no thyromegaly.  Lungs: Clear to auscultation, nonlabored.  Cardiac: Regular rate and rhythm with ectopy, 3/6 systolic murmur heard best at the base, no loud diastolic murmur, no S3.  Abdomen: Soft, nontender.  Extremities: Left leg in brace, distal pulses 2+.  Skin: Warm and dry. Musculoskeletal: Mild kyphosis. Neuropsychiatric: Alert and oriented 3, affect appropriate.   ECG: Tracing from September showed sinus rhythm with PAC, R' in V1 and V2, borderline low voltage..  Other Studies Reviewed Today:  Echocardiogram 05/08/2015: Study Conclusions  - Left ventricle: The cavity size was normal. Wall thickness was increased in a pattern of moderate LVH. Systolic function was vigorous. The estimated ejection fraction was in the range of 65% to 70%. Wall motion was normal; there were no regional wall motion abnormalities. Features are consistent with a pseudonormal left ventricular filling pattern, with concomitant abnormal relaxation and increased filling pressure (grade 2 diastolic dysfunction). - Aortic valve: Moderately to severely calcified annulus. Trileaflet; moderately thickened leaflets. There was moderate to severe stenosis. There was mild to moderate regurgitation. The AI VC is 0.2 cm . Mean gradient (S): 29 mm Hg. VTI ratio of LVOT to aortic valve: 0.33. Valve area (VTI): 1.03 cm^2. Regurgitation pressure half-time: 409 ms. - Aorta: Limited visualization of the proximal ascending aorta. The visualized proximal portion appears to be mild to moderately dilated at 4.4 cm. Cannot rule out more prominent dilatation in the more distal ascending aorta. Consider CTA or MRA to better define anatomy of the aorta. - Mitral valve: Mildly calcified annulus. Mildly  thickened leaflets . There was mild regurgitation. - Left atrium: The atrium was moderately dilated. - Right atrium: The atrium was mildly dilated. - Atrial septum: No defect or patent foramen ovale was identified. - Pulmonary arteries: Systolic pressure was mildly increased. PA peak pressure: 35 mm Hg (S). - Technically adequate study.  Event recorder 04/30/2015: Seven-day event monitor reviewed. Sinus rhythm noted in the 60s to 70s with occasional to frequent PACs. There were no pauses or sustained arrhythmias.  Assessment and Plan:  1. Moderate to severe aortic valve stenosis, closer to severe range with increasing mean gradient 29 mmHg, LVOT to AV VTI ratio 0.33, and calculated valve area 1.0 cm. She also has mild to moderate aortic regurgitation and dilatation of the ascending aorta 4.4 cm. The patient's recent episode of syncope sounds arrhythmogenic based on description rather than necessarily related to progressive aortic valve disease. She does have dyspnea on exertion, and has remained very functional despite her advanced age as outlined above. I have not pushed for aggressive management, but have mentioned the possibility of TAVR along the way. Based on discussion with patient and family today, plan is to have  her seen in consultation by Dr. Burt Knack to at least discuss her options, review the anticipated testing that would be required, and discuss realistic anticipated benefits of pursuing valve replacement at age 36.  2. History of syncope, sudden and not recurrent so far. Cardiac monitoring did not demonstrate any specific bradyarrhythmias or tachyarrhythmias.  3. History of PSVT.  Current medicines were reviewed with the patient today.   Orders Placed This Encounter  Procedures  . Ambulatory referral to Cardiology    Disposition: FU with me in 2 months.   Signed, Satira Sark, MD, [facility] 05/28/2015 11:56 AM    [facility], [place], [place] [address] Phone: [phone]; Fax: [phone]

## 2015-05-29 DIAGNOSIS — S8002XD Contusion of left knee, subsequent encounter: Secondary | ICD-10-CM | POA: Diagnosis not present

## 2015-05-29 DIAGNOSIS — I350 Nonrheumatic aortic (valve) stenosis: Secondary | ICD-10-CM | POA: Diagnosis not present

## 2015-05-29 DIAGNOSIS — I10 Essential (primary) hypertension: Secondary | ICD-10-CM | POA: Diagnosis not present

## 2015-05-29 DIAGNOSIS — R55 Syncope and collapse: Secondary | ICD-10-CM | POA: Diagnosis not present

## 2015-05-29 DIAGNOSIS — I8391 Asymptomatic varicose veins of right lower extremity: Secondary | ICD-10-CM | POA: Diagnosis not present

## 2015-05-29 DIAGNOSIS — S82032D Displaced transverse fracture of left patella, subsequent encounter for closed fracture with routine healing: Secondary | ICD-10-CM | POA: Diagnosis not present

## 2015-06-03 DIAGNOSIS — I10 Essential (primary) hypertension: Secondary | ICD-10-CM | POA: Diagnosis not present

## 2015-06-03 DIAGNOSIS — I350 Nonrheumatic aortic (valve) stenosis: Secondary | ICD-10-CM | POA: Diagnosis not present

## 2015-06-03 DIAGNOSIS — R55 Syncope and collapse: Secondary | ICD-10-CM | POA: Diagnosis not present

## 2015-06-03 DIAGNOSIS — I8391 Asymptomatic varicose veins of right lower extremity: Secondary | ICD-10-CM | POA: Diagnosis not present

## 2015-06-03 DIAGNOSIS — S82032D Displaced transverse fracture of left patella, subsequent encounter for closed fracture with routine healing: Secondary | ICD-10-CM | POA: Diagnosis not present

## 2015-06-03 DIAGNOSIS — S8002XD Contusion of left knee, subsequent encounter: Secondary | ICD-10-CM | POA: Diagnosis not present

## 2015-06-06 DIAGNOSIS — H353131 Nonexudative age-related macular degeneration, bilateral, early dry stage: Secondary | ICD-10-CM | POA: Diagnosis not present

## 2015-06-06 DIAGNOSIS — H35361 Drusen (degenerative) of macula, right eye: Secondary | ICD-10-CM | POA: Diagnosis not present

## 2015-06-06 DIAGNOSIS — H35362 Drusen (degenerative) of macula, left eye: Secondary | ICD-10-CM | POA: Diagnosis not present

## 2015-06-07 DIAGNOSIS — S82032D Displaced transverse fracture of left patella, subsequent encounter for closed fracture with routine healing: Secondary | ICD-10-CM | POA: Diagnosis not present

## 2015-06-07 DIAGNOSIS — I10 Essential (primary) hypertension: Secondary | ICD-10-CM | POA: Diagnosis not present

## 2015-06-07 DIAGNOSIS — I8391 Asymptomatic varicose veins of right lower extremity: Secondary | ICD-10-CM | POA: Diagnosis not present

## 2015-06-07 DIAGNOSIS — I350 Nonrheumatic aortic (valve) stenosis: Secondary | ICD-10-CM | POA: Diagnosis not present

## 2015-06-07 DIAGNOSIS — S8002XD Contusion of left knee, subsequent encounter: Secondary | ICD-10-CM | POA: Diagnosis not present

## 2015-06-07 DIAGNOSIS — R55 Syncope and collapse: Secondary | ICD-10-CM | POA: Diagnosis not present

## 2015-06-10 DIAGNOSIS — R55 Syncope and collapse: Secondary | ICD-10-CM | POA: Diagnosis not present

## 2015-06-10 DIAGNOSIS — S8002XD Contusion of left knee, subsequent encounter: Secondary | ICD-10-CM | POA: Diagnosis not present

## 2015-06-10 DIAGNOSIS — I350 Nonrheumatic aortic (valve) stenosis: Secondary | ICD-10-CM | POA: Diagnosis not present

## 2015-06-10 DIAGNOSIS — I10 Essential (primary) hypertension: Secondary | ICD-10-CM | POA: Diagnosis not present

## 2015-06-10 DIAGNOSIS — S82032D Displaced transverse fracture of left patella, subsequent encounter for closed fracture with routine healing: Secondary | ICD-10-CM | POA: Diagnosis not present

## 2015-06-10 DIAGNOSIS — I8391 Asymptomatic varicose veins of right lower extremity: Secondary | ICD-10-CM | POA: Diagnosis not present

## 2015-06-11 DIAGNOSIS — S82032A Displaced transverse fracture of left patella, initial encounter for closed fracture: Secondary | ICD-10-CM | POA: Diagnosis not present

## 2015-06-12 DIAGNOSIS — I350 Nonrheumatic aortic (valve) stenosis: Secondary | ICD-10-CM | POA: Diagnosis not present

## 2015-06-12 DIAGNOSIS — I8391 Asymptomatic varicose veins of right lower extremity: Secondary | ICD-10-CM | POA: Diagnosis not present

## 2015-06-12 DIAGNOSIS — S8002XD Contusion of left knee, subsequent encounter: Secondary | ICD-10-CM | POA: Diagnosis not present

## 2015-06-12 DIAGNOSIS — I10 Essential (primary) hypertension: Secondary | ICD-10-CM | POA: Diagnosis not present

## 2015-06-12 DIAGNOSIS — S82032D Displaced transverse fracture of left patella, subsequent encounter for closed fracture with routine healing: Secondary | ICD-10-CM | POA: Diagnosis not present

## 2015-06-12 DIAGNOSIS — R55 Syncope and collapse: Secondary | ICD-10-CM | POA: Diagnosis not present

## 2015-06-20 ENCOUNTER — Ambulatory Visit (INDEPENDENT_AMBULATORY_CARE_PROVIDER_SITE_OTHER): Payer: Medicare Other | Admitting: Cardiovascular Disease

## 2015-06-20 ENCOUNTER — Encounter: Payer: Self-pay | Admitting: Cardiovascular Disease

## 2015-06-20 VITALS — BP 110/80 | HR 92 | Ht 64.0 in | Wt 150.1 lb

## 2015-06-20 DIAGNOSIS — I350 Nonrheumatic aortic (valve) stenosis: Secondary | ICD-10-CM | POA: Diagnosis not present

## 2015-06-20 NOTE — Patient Instructions (Signed)
Medication Instructions:  Your physician recommends that you continue on your current medications as directed. Please refer to the Current Medication list given to you today.  Labwork: No new orders.   Testing/Procedures: No new orders.   Follow-Up: Your physician wants you to follow-up in: 6 MONTHS with Dr Burt Knack to discuss TAVR further. You will receive a reminder letter in the mail two months in advance. If you don't receive a letter, please call our office to schedule the follow-up appointment.   Any Other Special Instructions Will Be Listed Below (If Applicable).  Dr Burt Knack recommends that you have an Echocardiogram in the [facility] office prior to seeing him in 6 MONTHS.     If you need a refill on your cardiac medications before your next appointment, please call your pharmacy.

## 2015-06-20 NOTE — Progress Notes (Signed)
Cardiology Office Note Date:  06/20/2015   ID:  Christine Pugh, DOB May 04, 1922, MRN [id number]  PCP:  Gar Ponto, MD  Cardiologist:  Dr Domenic Polite  Chief Complaint  Patient presents with  . New Evaluation    tavr consult     History of Present Illness: Christine Pugh is a 81 y.o. female who presents for TAVR evaluation.   The patient has been followed by Dr. Domenic Polite for many years.  Cardiac problems include paroxysmal SVT and aortic stenosis.  She had an episode of frank syncope in September that was felt possibly arrhythmogenic. An event monitor did not demonstrate any significant brady- or tachy- arrhythmias.   An echocardiogram demonstrated some progression of her aortic stenosis with a mean gradient of 29 mmHg.   The patient has completed rehabilitation from her left patellar fracture sustained at the time of the syncopal event in September. She is back to doing her normal activities including swimming. She has had no other episodes of lightheadedness or syncope. She denies chest pain or pressure. She denies shortness of breath with walking. However, she does fatigue more easily with swimming. 2 years ago she can swim 200 yards freestyle without resting. Now she can swim 50 yards and admits to shortness of breath and fatiguewith this level of activity. She denies orthopnea, PND, or leg swelling.   The patient is here today with her son. She has been widowed for 11 years. She has another son who lives in [place]. She remains active, still drives a car, but does not like to drive in [place]. The patient lives in an apartment in [place], [place].  Past Medical History  Diagnosis Date  . Aortic insufficiency     Mild  . Aortic stenosis     Moderate 03/2013  . PSVT (paroxysmal supraventricular tachycardia) ([place])   . Benign essential HTN   . Hypothyroidism     Past Surgical History  Procedure Laterality Date  . Thyroidectomy    . Total abdominal hysterectomy    .  Appendectomy    . Tonsillectomy    . Vitrectomy and cataract    . Dental implant      Current Outpatient Prescriptions  Medication Sig Dispense Refill  . aspirin 81 MG tablet Take 81 mg by mouth daily.      Marland Kitchen levothyroxine (SYNTHROID, LEVOTHROID) 112 MCG tablet Take 112 mcg by mouth daily before breakfast.    . Lutein-Zeaxanthin 25-5 MG CAPS Take 1 capsule by mouth 2 (two) times daily.    . metoprolol succinate (TOPROL-XL) 25 MG 24 hr tablet Take 1 tablet (25 mg total) by mouth 2 (two) times daily. 180 tablet 3  . Multiple Vitamin (MULTIVITAMIN) tablet Take 1 tablet by mouth daily.      Marland Kitchen ofloxacin (OCUFLOX) 0.3 % ophthalmic solution Place 3 drops into the left eye 3 (three) times daily. Take 3 drops to left eye for 3 days after injection.    . Red Yeast Rice 600 MG TABS Take 1 tablet by mouth daily.     Marland Kitchen triamcinolone cream (KENALOG) 0.1 % Apply 1 application topically as needed.     . triamterene-hydrochlorothiazide (DYAZIDE) 37.5-25 MG per capsule Take 1 capsule by mouth daily.       No current facility-administered medications for this visit.    Allergies:   Review of patient's allergies indicates no known allergies.   Social History:  The patient  reports that she has never smoked. She has never used smokeless tobacco.  She reports that she does not drink alcohol or use illicit drugs.   Family History:  The patient's  family history includes Coronary artery disease in an other family member.    ROS:  Please see the history of present illness.  Otherwise, review of systems is positive for left leg pain and swelling, back pain, easy bruising.  All other systems are reviewed and negative.    PHYSICAL EXAM: VS:  BP 110/80 mmHg  Pulse 92  Ht 5\' 4"  (1.626 m)  Wt 150 lb 1.9 oz (68.094 kg)  BMI 25.76 kg/m2  SpO2 95% , BMI Body mass index is 25.76 kg/(m^2). GEN: Well nourished, well developed, in no acute distress HEENT: normal Neck: no JVD, no masses. No carotid bruits Cardiac:  RRR with 3/6 late-peaking systolic murmur at the RUSB, preserved A2.                Respiratory:  clear to auscultation bilaterally, normal work of breathing GI: soft, nontender, nondistended, + BS MS: no deformity or atrophy Ext: no pretibial edema, pedal pulses 2+= bilaterally Skin: warm and dry, no rash Neuro:  Strength and sensation are intact Psych: euthymic mood, full affect  EKG:  EKG is not ordered today.  Recent Labs: No results found for requested labs within last 365 days.   Lipid Panel  No results found for: CHOL, TRIG, HDL, CHOLHDL, VLDL, LDLCALC, LDLDIRECT    Wt Readings from Last 3 Encounters:  06/20/15 150 lb 1.9 oz (68.094 kg)  05/28/15 145 lb (65.772 kg)  04/26/15 147 lb (66.679 kg)     Cardiac Studies Reviewed:  2-D echocardiogram 05/08/2015: Study Conclusions  - Left ventricle: The cavity size was normal. Wall thickness was increased in a pattern of moderate LVH. Systolic function was vigorous. The estimated ejection fraction was in the range of 65% to 70%. Wall motion was normal; there were no regional wall motion abnormalities. Features are consistent with a pseudonormal left ventricular filling pattern, with concomitant abnormal relaxation and increased filling pressure (grade 2 diastolic dysfunction). - Aortic valve: Moderately to severely calcified annulus. Trileaflet; moderately thickened leaflets. There was moderate to severe stenosis. There was mild to moderate regurgitation. The AI VC is 0.2 cm . Mean gradient (S): 29 mm Hg. VTI ratio of LVOT to aortic valve: 0.33. Valve area (VTI): 1.03 cm^2. Regurgitation pressure half-time: 409 ms. - Aorta: Limited visualization of the proximal ascending aorta. The visualized proximal portion appears to be mild to moderately dilated at 4.4 cm. Cannot rule out more prominent dilatation in the more distal ascending aorta. Consider CTA or MRA to better define anatomy of the  aorta. - Mitral valve: Mildly calcified annulus. Mildly thickened leaflets . There was mild regurgitation. - Left atrium: The atrium was moderately dilated. - Right atrium: The atrium was mildly dilated. - Atrial septum: No defect or patent foramen ovale was identified. - Pulmonary arteries: Systolic pressure was mildly increased. PA peak pressure: 35 mm Hg (S). - Technically adequate study.  ASSESSMENT AND PLAN: 81 yo woman with moderately severe aortic stenosis, NYHA II symptoms of exercise intolerance, primarily associated with swimming. Difficult to know whether aortic stenosis was related to her episode of syncope and she understands this. I agree with Dr Domenic Polite that by history this seems more likely related to arrhythmia. I have personally reviewed the patient's echo study and by gradient measurement and aortic valve dimensionless index, her AS is progressing but remains in the moderate range. I have reviewed the natural history  of aortic stenosis with the patient and and her son today. We have discussed the limitations of medical therapy and the poor prognosis associated with symptomatic aortic stenosis. We have also reviewed potential treatment options, including palliative medical therapy, conventional surgical aortic valve replacement, and transcatheter aortic valve replacement. We discussed treatment options in the context of this patient's specific comorbid medical conditions.  When the patient ultimately requires treatment, I think she would be a good candidate for TAVR considering her advanced age and relatively well-preserved functional capacity. After discussion of options, we have elected to continue with close observation for now. I have recommended a follow-up echo in 6 months and I would like to see her after that. She understands to watch for symptoms of progressive shortness of breath, chest pain, or recurrent syncope.   I reviewed the TAVR procedure in detail today,  including necessary preop workup including cardiac cath, CTA studies, and cardiac surgical evaluation. Also comprehensively reviewed risks of TAVR and expected post-operative course.    Current medicines are reviewed with the patient today.  The patient does not have concerns regarding medicines.  Labs/ tests ordered today include:  No orders of the defined types were placed in this encounter.   Disposition:   FU 6 months  Signed, Sherren Mocha, MD  06/20/2015 1:27 PM    [facility] [place], [place], [place]  [address] Phone: [phone]; Fax: [phone]

## 2015-08-09 ENCOUNTER — Encounter: Payer: Self-pay | Admitting: Cardiology

## 2015-08-09 ENCOUNTER — Ambulatory Visit (INDEPENDENT_AMBULATORY_CARE_PROVIDER_SITE_OTHER): Payer: Medicare Other | Admitting: Cardiology

## 2015-08-09 VITALS — BP 114/80 | HR 132 | Ht 64.0 in | Wt 150.8 lb

## 2015-08-09 DIAGNOSIS — I10 Essential (primary) hypertension: Secondary | ICD-10-CM

## 2015-08-09 DIAGNOSIS — Z87898 Personal history of other specified conditions: Secondary | ICD-10-CM

## 2015-08-09 DIAGNOSIS — I471 Supraventricular tachycardia: Secondary | ICD-10-CM | POA: Diagnosis not present

## 2015-08-09 DIAGNOSIS — M7989 Other specified soft tissue disorders: Secondary | ICD-10-CM

## 2015-08-09 DIAGNOSIS — I350 Nonrheumatic aortic (valve) stenosis: Secondary | ICD-10-CM

## 2015-08-09 DIAGNOSIS — Z9189 Other specified personal risk factors, not elsewhere classified: Secondary | ICD-10-CM

## 2015-08-09 MED ORDER — AMIODARONE HCL 200 MG PO TABS
100.0000 mg | ORAL_TABLET | Freq: Two times a day (BID) | ORAL | Status: DC
Start: 2015-08-09 — End: 2015-08-13

## 2015-08-09 NOTE — Progress Notes (Signed)
Cardiology Office Note  Date: 08/09/2015   ID: Christine Pugh, DOB August 26, 1921, MRN [id number]  PCP: Gar Ponto, MD  Primary Cardiologist: Rozann Lesches, MD   Chief Complaint  Patient presents with  . Aortic Stenosis  . PSVT    History of Present Illness: Christine Pugh is a 82 y.o. female last seen in October 2016. I had her evaluated by  Dr. Burt Knack in the interim for discussion of her aortic valve disease and the possibility of TAVR. Discussion was had at that time and plan is for a follow-up echocardiogram in 6 months from the previous study.  She presents today for a routine visit. It was noted that her heart rate was in the 130s when she came into the office today. She was not aware of any sense of palpitations or shortness of breath, also no chest pain or dizziness. She had been following her usual routine this morning, went to swim at the [facility], and also went to the store to pick up some things. ECG shows what looks like an ectopic atrial tachycardia 130 bpm with left anterior fascicular block and nonspecific T-wave changes.  She has a previous history of PSVT years ago. She has been compliant with her medications including beta blocker outlined below.  Today we discussed this arrhythmia and potential implications. Although essentially asymptomatic now, I am concerned that if this persists she may well start to experience heart failure symptoms particular in light of her valvular heart disease. We discussed hospitalization, for now she has elected to pursue outpatient treatment, although is willing to go to the hospital if things do not improve in the short-term.  Unrelated to the above, she states that she has had unilateral left leg swelling for several weeks, which followed her prior fall for which she wore a brace on her left leg. She does not have any pain in her leg or limited ambulation.  Past Medical History  Diagnosis Date  . Aortic insufficiency    Mild  . Aortic stenosis     Moderate 03/2013  . PSVT (paroxysmal supraventricular tachycardia) ([place])   . Benign essential HTN   . Hypothyroidism     Past Surgical History  Procedure Laterality Date  . Thyroidectomy    . Total abdominal hysterectomy    . Appendectomy    . Tonsillectomy    . Vitrectomy and cataract    . Dental implant      Current Outpatient Prescriptions  Medication Sig Dispense Refill  . aspirin 81 MG tablet Take 81 mg by mouth daily.      Marland Kitchen levothyroxine (SYNTHROID, LEVOTHROID) 112 MCG tablet Take 112 mcg by mouth daily before breakfast.    . Lutein-Zeaxanthin 25-5 MG CAPS Take 1 capsule by mouth 2 (two) times daily.    . metoprolol succinate (TOPROL-XL) 25 MG 24 hr tablet Take 1 tablet (25 mg total) by mouth 2 (two) times daily. 180 tablet 3  . Multiple Vitamin (MULTIVITAMIN) tablet Take 1 tablet by mouth daily.      Marland Kitchen ofloxacin (OCUFLOX) 0.3 % ophthalmic solution Place 3 drops into the left eye 3 (three) times daily. Take 3 drops to left eye for 3 days after injection.    . Red Yeast Rice 600 MG TABS Take 1 tablet by mouth daily.     Marland Kitchen triamcinolone cream (KENALOG) 0.1 % Apply 1 application topically as needed.     . triamterene-hydrochlorothiazide (DYAZIDE) 37.5-25 MG per capsule Take 1 capsule by mouth daily.      Marland Kitchen  amiodarone (PACERONE) 200 MG tablet Take 0.5 tablets (100 mg total) by mouth 2 (two) times daily. Start today 08/09/15 60 tablet 1   No current facility-administered medications for this visit.   Allergies:  Review of patient's allergies indicates no known allergies.   Social History: The patient  reports that she has never smoked. She has never used smokeless tobacco. She reports that she does not drink alcohol or use illicit drugs.   ROS:  Please see the history of present illness. Otherwise, complete review of systems is positive for NYHA class II dyspnea.  All other systems are reviewed and negative.   Physical Exam: VS:  BP 114/80 mmHg   Pulse 132  Ht 5\' 4"  (1.626 m)  Wt 150 lb 12.8 oz (68.402 kg)  BMI 25.87 kg/m2  SpO2 100%, BMI Body mass index is 25.87 kg/(m^2).  Wt Readings from Last 3 Encounters:  08/09/15 150 lb 12.8 oz (68.402 kg)  06/20/15 150 lb 1.9 oz (68.094 kg)  05/28/15 145 lb (65.772 kg)    Normally nourished appearing elderly woman, appears comfortable at rest. HEENT: Conjunctiva and lids normal, oropharynx clear.  Neck: Supple, no carotid bruits, no thyromegaly.  Lungs: Clear to auscultation, nonlabored.  Cardiac:  Rapid RRR, 3/6 systolic murmur heard best at the base, no loud diastolic murmur, no S3.  Abdomen: Soft, nontender.  Extremities: Left leg in brace, distal pulses 2+.  Skin: Warm and dry. Musculoskeletal: Mild kyphosis. Neuropsychiatric: Alert and oriented 3, affect appropriate.  ECG: ECG is ordered today.  Other Studies Reviewed Today:  Echocardiogram 05/08/2015: Study Conclusions  - Left ventricle: The cavity size was normal. Wall thickness was increased in a pattern of moderate LVH. Systolic function was vigorous. The estimated ejection fraction was in the range of 65% to 70%. Wall motion was normal; there were no regional wall motion abnormalities. Features are consistent with a pseudonormal left ventricular filling pattern, with concomitant abnormal relaxation and increased filling pressure (grade 2 diastolic dysfunction). - Aortic valve: Moderately to severely calcified annulus. Trileaflet; moderately thickened leaflets. There was moderate to severe stenosis. There was mild to moderate regurgitation. The AI VC is 0.2 cm . Mean gradient (S): 29 mm Hg. VTI ratio of LVOT to aortic valve: 0.33. Valve area (VTI): 1.03 cm^2. Regurgitation pressure half-time: 409 ms. - Aorta: Limited visualization of the proximal ascending aorta. The visualized proximal portion appears to be mild to moderately dilated at 4.4 cm. Cannot rule out more prominent  dilatation in the more distal ascending aorta. Consider CTA or MRA to better define anatomy of the aorta. - Mitral valve: Mildly calcified annulus. Mildly thickened leaflets . There was mild regurgitation. - Left atrium: The atrium was moderately dilated. - Right atrium: The atrium was mildly dilated. - Atrial septum: No defect or patent foramen ovale was identified. - Pulmonary arteries: Systolic pressure was mildly increased. PA peak pressure: 35 mm Hg (S). - Technically adequate study.  Assessment and Plan:  1.  Recurrent SVT, looks to be a possible ectopic atrial tachycardia or potentially atypical atrial flutter. Duration is uncertain but I would suspect recent since she is relatively asymptomatic at this time. Blood pressure is normal. She does not report any chest pain or breathlessness, in fact has gone about her typical routine this morning including swimming laps at the [facility]. As per above discussion, we will attempt to manage this as an outpatient initially, although she is willing to go to the hospital if heart rate does not come down within  the next 24 hours. We will start amiodarone 200 mg twice daily and she will continue Toprol-XL. I do not plan to anticoagulate her at this time. If heart rate does not come down and/or she starts to becomes short of breath or experience chest discomfort, I have encouraged her to get to the ER for further management. If heart rate does settle down, she will call the office and come in for a follow-up ECG early next week.  2. Moderately severe aortic stenosis, most recent echocardiogram is outlined above. She will have a follow-up study in March of this year. She has already seen Dr. Burt Knack for assessment for potential TAVR depending on progression and symptomatology.  3. Essential hypertension, blood pressure is normal today.  4. History of syncope, no clear explanation although sounded arrhythmogenic. Interestingly, she is not having any  particular symptoms now with rapid heart rates so doubt that this was the cause.  5. Asymmetric left leg edema. We will obtain lower extremity venous Dopplers.  Current medicines were reviewed with the patient today.   Orders Placed This Encounter  Procedures  . EKG 12-Lead  . ECHOCARDIOGRAM COMPLETE    Disposition: FU with me in 2 weeks.   Signed, Satira Sark, MD, [facility] 08/09/2015 12:37 PM    [facility] [place], [place], [place] [address] Phone: [phone]; Fax: [phone]

## 2015-08-09 NOTE — Patient Instructions (Signed)
Your physician has recommended you make the following change in your medication:  Start amiodarone 200 mg twice daily starting today. Continue all other medications the same. Please check your heart rate at home. Call our office on Monday if your heart rate comes down so that you can come to the office for an EKG. If your heart rate doesn't come down by Monday, you will need to go to the ED for an evaluation. Your physician has requested that you have a left lower extremity venous duplex. This test is an ultrasound of the veins in the legs or arms. It looks at venous blood flow that carries blood from the heart to the legs or arms. Allow one hour for a Lower Venous exam. Allow thirty minutes for an Upper Venous exam. There are no restrictions or special instructions. Your physician has requested that you have an echocardiogram in March 2017. Echocardiography is a painless test that uses sound waves to create images of your heart. It provides your doctor with information about the size and shape of your heart and how well your heart's chambers and valves are working. This procedure takes approximately one hour. There are no restrictions for this procedure. Your physician recommends that you schedule a follow-up appointment in: 2 weeks.

## 2015-08-11 ENCOUNTER — Encounter (HOSPITAL_COMMUNITY): Payer: Self-pay

## 2015-08-11 ENCOUNTER — Emergency Department (HOSPITAL_COMMUNITY)
Admission: EM | Admit: 2015-08-11 | Discharge: 2015-08-11 | Disposition: A | Discharge status: Home / Self Care | Payer: Medicare Other | Attending: Emergency Medicine | Admitting: Emergency Medicine

## 2015-08-11 ENCOUNTER — Other Ambulatory Visit: Payer: Self-pay

## 2015-08-11 ENCOUNTER — Emergency Department (HOSPITAL_COMMUNITY): Payer: Medicare Other

## 2015-08-11 DIAGNOSIS — I10 Essential (primary) hypertension: Secondary | ICD-10-CM | POA: Diagnosis not present

## 2015-08-11 DIAGNOSIS — R531 Weakness: Secondary | ICD-10-CM | POA: Diagnosis not present

## 2015-08-11 DIAGNOSIS — Z7982 Long term (current) use of aspirin: Secondary | ICD-10-CM | POA: Insufficient documentation

## 2015-08-11 DIAGNOSIS — E039 Hypothyroidism, unspecified: Secondary | ICD-10-CM | POA: Diagnosis not present

## 2015-08-11 DIAGNOSIS — R21 Rash and other nonspecific skin eruption: Secondary | ICD-10-CM | POA: Diagnosis not present

## 2015-08-11 DIAGNOSIS — Z7952 Long term (current) use of systemic steroids: Secondary | ICD-10-CM | POA: Diagnosis not present

## 2015-08-11 DIAGNOSIS — Z792 Long term (current) use of antibiotics: Secondary | ICD-10-CM | POA: Diagnosis not present

## 2015-08-11 DIAGNOSIS — Z79899 Other long term (current) drug therapy: Secondary | ICD-10-CM | POA: Insufficient documentation

## 2015-08-11 DIAGNOSIS — R000 Tachycardia, unspecified: Secondary | ICD-10-CM | POA: Diagnosis not present

## 2015-08-11 MED ORDER — FAMOTIDINE 20 MG PO TABS
20.0000 mg | ORAL_TABLET | Freq: Once | ORAL | Status: AC
  Administered 2015-08-11: 20 mg via ORAL
  Filled 2015-08-11: qty 1

## 2015-08-11 MED ORDER — BETAMETHASONE SOD PHOS & ACET 6 (3-3) MG/ML IJ SUSP
6.0000 mg | Freq: Once | INTRAMUSCULAR | Status: AC
  Administered 2015-08-11: 6 mg via INTRAMUSCULAR

## 2015-08-11 MED ORDER — CYPROHEPTADINE HCL 4 MG PO TABS: 4.0000 mg | ORAL_TABLET | Freq: Once | ORAL | Status: DC

## 2015-08-11 MED ORDER — BETAMETHASONE SOD PHOS & ACET 6 (3-3) MG/ML IJ SUSP
INTRAMUSCULAR | Status: AC
  Administered 2015-08-11: 6 mg via INTRAMUSCULAR
  Filled 2015-08-11: qty 1

## 2015-08-11 NOTE — ED Notes (Signed)
PT reports noticed her HR was up Friday and went to Dr. Domenic Polite.  Reports was put on amiodarone and told to follow up with him Monday or come to Er if HR doesn't slow.  PT also reports a rash on torso that she gets every couple of years.  Pt says if she catches it early enough her steroid cream relieves it but says this time the cream isn't helping.  Pt says usually gets a steroid shot when cream doesn't help.

## 2015-08-11 NOTE — ED Provider Notes (Addendum)
CSN: [id number]     Arrival date & time 08/11/15  1204 History   First MD Initiated Contact with Patient 08/11/15 1221     Chief Complaint  Patient presents with  . Tachycardia     (Consider location/radiation/quality/duration/timing/severity/associated sxs/prior Treatment) HPI   Christine Pugh is a 82 y.o. femaleWho presents for evaluation of a rash which is present, for several days, and is not responding to triamcinolone cream. The rash is recurrent, and typically occurs during the winter months. Sometimes, she has to have injection to make the rash get better.she was seen by her cardiologist, 2 days ago for routine follow-up evaluation on cardiac disease. Incidental note was made of tachycardia which was felt to be subacute in stable. She was started on amiodarone which she is taking, and apparently tolerating. She was told to follow-up if she had increasing symptoms such as chest pain, weakness, dizziness or shortness of breath. She has not had any of these symptoms. She is due for a follow-up appointment with her cardiologist, in 3 days.there are no other known modifying factors.    Past Medical History  Diagnosis Date  . Aortic insufficiency     Mild  . Aortic stenosis     Moderate 03/2013  . PSVT (paroxysmal supraventricular tachycardia) ([place])   . Benign essential HTN   . Hypothyroidism    Past Surgical History  Procedure Laterality Date  . Thyroidectomy    . Total abdominal hysterectomy    . Appendectomy    . Tonsillectomy    . Vitrectomy and cataract    . Dental implant     Family History  Problem Relation Age of Onset  . Coronary artery disease     Social History  Substance Use Topics  . Smoking status: Never Smoker   . Smokeless tobacco: Never Used  . Alcohol Use: No   OB History    No data available     Review of Systems  All other systems reviewed and are negative.     Allergies  Review of patient's allergies indicates no known allergies.  Home  Medications   Prior to Admission medications   Medication Sig Start Date End Date Taking? Authorizing Provider  amiodarone (PACERONE) 200 MG tablet Take 0.5 tablets (100 mg total) by mouth 2 (two) times daily. Start today 08/09/15 08/09/15  Yes Satira Sark, MD  aspirin EC 81 MG tablet Take 81 mg by mouth daily.   Yes Historical Provider, MD  levothyroxine (SYNTHROID, LEVOTHROID) 112 MCG tablet Take 112 mcg by mouth daily before breakfast.   Yes Historical Provider, MD  Lutein-Zeaxanthin 25-5 MG CAPS Take 1 capsule by mouth 2 (two) times daily.   Yes Historical Provider, MD  metoprolol succinate (TOPROL-XL) 25 MG 24 hr tablet Take 1 tablet (25 mg total) by mouth 2 (two) times daily. 09/24/14  Yes Satira Sark, MD  Multiple Vitamin (MULTIVITAMIN) tablet Take 1 tablet by mouth daily.     Yes Historical Provider, MD  ofloxacin (OCUFLOX) 0.3 % ophthalmic solution Place 3 drops into the left eye 3 (three) times daily. Take 3 drops to left eye for 3 days after injection. 03/03/12  Yes Historical Provider, MD  Red Yeast Rice 600 MG TABS Take 1 tablet by mouth daily.    Yes Historical Provider, MD  triamterene-hydrochlorothiazide (DYAZIDE) 37.5-25 MG per capsule Take 1 capsule by mouth daily.     Yes Historical Provider, MD  triamcinolone cream (KENALOG) 0.1 % Apply 1 application topically as  needed (irritated skin).  09/07/12   Historical Provider, MD   BP 105/78 mmHg  Pulse 123  Temp(Src) 97.4 F (36.3 C) (Oral)  Resp 15  Ht 5\' 4"  (1.626 m)  Wt 146 lb (66.225 kg)  BMI 25.05 kg/m2  SpO2 98% Physical Exam  Constitutional: She is oriented to person, place, and time. She appears well-developed and well-nourished. No distress.  HENT:  Head: Normocephalic and atraumatic.  Right Ear: External ear normal.  Left Ear: External ear normal.  Eyes: Conjunctivae and EOM are normal. Pupils are equal, round, and reactive to light.  Neck: Normal range of motion and phonation normal. Neck supple.   Cardiovascular: Normal rate, regular rhythm and normal heart sounds.   Pulmonary/Chest: Effort normal and breath sounds normal. She exhibits no bony tenderness.  Abdominal: Soft. There is no tenderness.  Musculoskeletal: Normal range of motion.  Neurological: She is alert and oriented to person, place, and time. No cranial nerve deficit or sensory deficit. She exhibits normal muscle tone. Coordination normal.  Skin: Skin is warm, dry and intact.  Flat, red rash of the torso, primarily anterior and lateral bilaterally. The rash is confluent. There are no areas of petechiae, vesicles or excoriation.  Psychiatric: She has a normal mood and affect. Her behavior is normal. Judgment and thought content normal.  Nursing note and vitals reviewed.   ED Course  Procedures (including critical care time)  Medications  betamethasone acetate-betamethasone sodium phosphate (CELESTONE) injection 6 mg (6 mg Intramuscular Given 08/11/15 1350)  famotidine (PEPCID) tablet 20 mg (20 mg Oral Given 08/11/15 1335)    Patient Vitals for the past 24 hrs:  BP Temp Temp src Pulse Resp SpO2 Height Weight  08/11/15 1350 105/78 mmHg - - (!) 123 15 98 % - -  08/11/15 1220 113/80 mmHg 97.4 F (36.3 C) Oral (!) 131 16 97 % 5\' 4"  (1.626 m) 146 lb (66.225 kg)    2:07 PM Reevaluation with update and discussion. After initial assessment and treatment, an updated evaluation reveals she remains comfortable, findings discussed with patient and son, all questions answered. [place] Review Labs Reviewed - No data to display  Imaging Review Dg Chest 2 View  08/11/2015  CLINICAL DATA:  Tachycardia, all over body rash since Monday. EXAM: CHEST  2 VIEW COMPARISON:  Chest x-ray dated 11/09/2006. FINDINGS: Heart size is upper normal. Overall cardiomediastinal silhouette remains within normal limits in size and configuration. Atherosclerotic changes are again seen at the aortic arch. Lungs are clear. Lung volumes are  normal. No pleural effusion. No pneumothorax seen. Surgical clips are seen over the left lower chest. Mild degenerative changes seen throughout the slightly scoliotic thoracic spine. Osseous structures about the chest are otherwise unremarkable. IMPRESSION: Stable chest x-ray. No evidence of acute cardiopulmonary abnormality. Electronically Signed   By: Franki Cabot M.D.   On: 08/11/2015 13:51   I have personally reviewed and evaluated these images and lab results as part of my medical decision-making.   EKG Interpretation   Date/Time:  Sunday August 11 2015 12:18:46 EST Ventricular Rate:  129 PR Interval:  140 QRS Duration: 90 QT Interval:  315 QTC Calculation: 461 R Axis:   -51 Text Interpretation:  Sinus tachycardia Left anterior fascicular block Low  voltage, extremity leads Abnormal R-wave progression, late transition No  old tracing to compare Confirmed by [facility]  MD, Alanie Syler [phone]) on 08/11/2015  2:20:36 PM      MDM   Final diagnoses:  Rash  Tachycardia   Nonspecific rash, likely allergic. Uncomplicated tachycardia. This is known, and under treatment, an assessment by her cardiologist. No evidence for acute congestive heart failure, systemic allergic reaction, or ACS.  Nursing Notes Reviewed/ Care Coordinated Applicable Imaging Reviewed Interpretation of Laboratory Data incorporated into ED treatment  The patient appears reasonably screened and/or stabilized for discharge and I doubt any other medical condition or other [facility] requiring further screening, evaluation, or treatment in the ED at this time prior to discharge.  Plan: Home Medications- Rx Periactin for itching, called in to [facility]; Home Treatments- rest; return here if the recommended treatment, does not improve the symptoms; Recommended follow up- PCP prn. Cardiology as scheduled. Return to ED for progressive sx concerning for CHF.     Daleen Bo, MD 08/11/15 [place], MD 08/11/15 1816

## 2015-08-11 NOTE — Discharge Instructions (Signed)
Use famotidine 20 mg twice a day for 5 days.  We called in a prescription to the [facility], to take for itching, 3 times a day.  See your cardiologist, on Wednesday, as scheduled.  Return here if needed, for problems.

## 2015-08-12 ENCOUNTER — Telehealth: Payer: Self-pay | Admitting: *Deleted

## 2015-08-12 NOTE — Telephone Encounter (Signed)
Patient recently seen by Dr. Domenic Polite on 08/09/2015.  Was started on Amiodarone 200mg  twice a day.  Patient was advised to go to ED for further management, if heart rate does not come down and/or she starts to becomes short of breath or experience chest discomfort.  If heart rate does settle down, she will call the office and come in for a follow-up ECG early next week.  Patient called office to inform MD that she had been to [facility] ED, but not for her heart rate.  Stated she had a rash that she gets periodically & could not get relief at home.  Stated that she did not & still does not have SOB or chest pain.  Stated that her heart rate has remained elevated.  Stated that the ER MD suggested she discuss with cardiologist possibly increasing her Amiodarone dosage.  Message sent to provider for further advice.

## 2015-08-12 NOTE — Telephone Encounter (Signed)
Patient with fairly asymptomatic tachycardia, just started on loading dose of oral amiodarone by Dr Domenic Polite a few days ago. I do not see any urgent indication for changes this afternoon, please forward message to Dr Domenic Polite who is back from vacation tomorrow to readdress.   Zandra Abts MD

## 2015-08-12 NOTE — Telephone Encounter (Signed)
Patient notified & message fwd to Dr. Domenic Polite to address tomorrow.

## 2015-08-13 ENCOUNTER — Ambulatory Visit (INDEPENDENT_AMBULATORY_CARE_PROVIDER_SITE_OTHER): Payer: Medicare Other | Admitting: *Deleted

## 2015-08-13 ENCOUNTER — Encounter: Payer: Self-pay | Admitting: *Deleted

## 2015-08-13 VITALS — BP 128/85 | HR 125 | Ht 64.0 in | Wt 150.0 lb

## 2015-08-13 DIAGNOSIS — I471 Supraventricular tachycardia: Secondary | ICD-10-CM

## 2015-08-13 MED ORDER — AMIODARONE HCL 200 MG PO TABS: 200.0000 mg | ORAL_TABLET | Freq: Two times a day (BID) | ORAL | Status: DC

## 2015-08-13 NOTE — Patient Instructions (Signed)
Your physician has recommended you make the following change in your medication:  Increase amiodarone to 200 mg twice daily. Please take the whole tablet twice daily. Continue all other medications the same. Your physician recommends that you schedule a follow-up appointment in: 2 weeks.

## 2015-08-13 NOTE — Progress Notes (Signed)
Patient here for nurse visit today. States that she does not feel any sense of palpitations, chest pain, or shortness of breath. ECG repeated today and shows a probable atrial tachycardia as before with heart rate 129 bpm. She has been on amiodarone at 100 mg twice daily since late last last week. Increase dose to 200 mg twice daily for now and check back with office this week. I am hopeful that she will convert without further specific intervention.

## 2015-08-13 NOTE — Telephone Encounter (Signed)
Reviewed chart. ECGs over weekend incorrectly interpreted as sinus tachycardia. No changes or adjustments made since I saw her last week. Have her come in for an ECG today.

## 2015-08-13 NOTE — Progress Notes (Signed)
Patient came for EKG per request by MD. Patient has taken all medication doses as prescribed without missing any dose. No side effects noted per patient. No c/o chest pain, dizziness or sob.

## 2015-08-13 NOTE — Telephone Encounter (Signed)
Patient informed. 

## 2015-08-14 ENCOUNTER — Ambulatory Visit: Payer: Medicare Other

## 2015-08-14 DIAGNOSIS — M7989 Other specified soft tissue disorders: Secondary | ICD-10-CM | POA: Diagnosis not present

## 2015-08-19 ENCOUNTER — Telehealth: Payer: Self-pay | Admitting: *Deleted

## 2015-08-19 ENCOUNTER — Telehealth: Payer: Self-pay | Admitting: Cardiology

## 2015-08-19 NOTE — Telephone Encounter (Signed)
Patient called stating that her heart rate has come down. She wants to know if she exercise?  Please call her cell phone

## 2015-08-19 NOTE — Telephone Encounter (Signed)
-----   Message from Satira Sark, MD sent at 08/19/2015  9:48 AM EST ----- Reviewed report. Let her know that there was no evidence of DVT.

## 2015-08-19 NOTE — Telephone Encounter (Signed)
Patient informed and verbalized understanding of plan. Patient said that she feels fine.

## 2015-08-19 NOTE — Telephone Encounter (Signed)
Patient informed. 

## 2015-08-19 NOTE — Telephone Encounter (Signed)
Have her continue amiodarone at 200 mg twice daily for another week, get a repeat ECG after that. She may continue with her regular activities for now as long if she feels normal.

## 2015-08-19 NOTE — Telephone Encounter (Signed)
Patient's called to let our office know that her heart rate came down some. This morning patient's HR=116.

## 2015-08-26 DIAGNOSIS — Z23 Encounter for immunization: Secondary | ICD-10-CM | POA: Diagnosis not present

## 2015-08-27 ENCOUNTER — Encounter: Payer: Self-pay | Admitting: Cardiology

## 2015-08-27 ENCOUNTER — Ambulatory Visit (INDEPENDENT_AMBULATORY_CARE_PROVIDER_SITE_OTHER): Payer: Medicare Other | Admitting: Cardiology

## 2015-08-27 VITALS — BP 102/68 | HR 110 | Ht 64.0 in | Wt 150.0 lb

## 2015-08-27 DIAGNOSIS — I350 Nonrheumatic aortic (valve) stenosis: Secondary | ICD-10-CM

## 2015-08-27 DIAGNOSIS — I471 Supraventricular tachycardia: Secondary | ICD-10-CM

## 2015-08-27 MED ORDER — AMIODARONE HCL 200 MG PO TABS: 200.0000 mg | ORAL_TABLET | Freq: Every day | ORAL | Status: DC

## 2015-08-27 NOTE — Progress Notes (Signed)
Cardiology Office Note  Date: 08/27/2015   ID: Christine Pugh, DOB 05-27-1922, MRN [id number]  PCP: Gar Ponto, MD  Primary Cardiologist: Rozann Lesches, MD   Chief Complaint  Patient presents with  . Follow-up SVT  . Aortic stenosis    History of Present Illness: Christine Pugh is a 82 y.o. female last seen on January 6. She has been on amiodarone for management of a probable ectopic atrial tachycardia versus atypical atrial flutter of uncertain duration, incidentally noted on recent examination. She presents for a follow-up visit and states that she still does not feel any major sense of palpitations or chest pain. We reviewed home blood pressure and heart rate checks, she does have significant periods of time when her heart rate is down in the 60s to 80s, and I suspect that she is coming in and out of her atrial arrhythmia. She was sleeping a lot more than normal last week, but this week has felt better, went to swim laps at the [facility] normally this morning.  I reviewed her ECG today showing a probable ectopic atrial tachycardia with intermittent aberrantly conducted beats and nonspecific T-wave changes.  Past Medical History  Diagnosis Date  . Aortic insufficiency     Mild  . Aortic stenosis     Moderate 03/2013  . PSVT (paroxysmal supraventricular tachycardia) ([place])   . Benign essential HTN   . Hypothyroidism     Current Outpatient Prescriptions  Medication Sig Dispense Refill  . aspirin EC 81 MG tablet Take 81 mg by mouth daily.    . famotidine (PEPCID) 20 MG tablet Take 20 mg by mouth 2 (two) times daily.    Marland Kitchen levothyroxine (SYNTHROID, LEVOTHROID) 112 MCG tablet Take 112 mcg by mouth daily before breakfast.    . Lutein-Zeaxanthin 25-5 MG CAPS Take 1 capsule by mouth 2 (two) times daily.    . metoprolol succinate (TOPROL-XL) 25 MG 24 hr tablet Take 1 tablet (25 mg total) by mouth 2 (two) times daily. 180 tablet 3  . Multiple Vitamin (MULTIVITAMIN) tablet Take 1  tablet by mouth daily.      . Red Yeast Rice 600 MG TABS Take 1 tablet by mouth daily. Reported on 08/13/2015    . triamcinolone cream (KENALOG) 0.1 % Apply 1 application topically as needed (irritated skin).     Marland Kitchen triamterene-hydrochlorothiazide (DYAZIDE) 37.5-25 MG per capsule Take 1 capsule by mouth daily.      Marland Kitchen amiodarone (PACERONE) 200 MG tablet Take 1 tablet (200 mg total) by mouth daily. 90 tablet 3  . cyproheptadine (PERIACTIN) 4 MG tablet Take 4 mg by mouth 3 (three) times daily as needed for allergies. Reported on 08/27/2015     No current facility-administered medications for this visit.   Allergies:  Review of patient's allergies indicates no known allergies.   Social History: The patient  reports that she has never smoked. She has never used smokeless tobacco. She reports that she does not drink alcohol or use illicit drugs.   ROS:  Please see the history of present illness. Otherwise, complete review of systems is positive for resolution of previous rash.  All other systems are reviewed and negative.   Physical Exam: VS:  BP 102/68 mmHg  Pulse 110  Ht 5\' 4"  (1.626 m)  Wt 150 lb (68.04 kg)  BMI 25.73 kg/m2  SpO2 95%, BMI Body mass index is 25.73 kg/(m^2).  Wt Readings from Last 3 Encounters:  08/27/15 150 lb (68.04 kg)  08/13/15  150 lb (68.04 kg)  08/11/15 146 lb (66.225 kg)    Normally nourished appearing elderly woman, appears comfortable at rest. HEENT: Conjunctiva and lids normal, oropharynx clear.  Neck: Supple, no carotid bruits, no thyromegaly.  Lungs: Clear to auscultation, nonlabored.  Cardiac: Irregular, 3/6 systolic murmur heard best at the base, no loud diastolic murmur, no S3.  Abdomen: Soft, nontender.  Extremities: No leg edema, distal pulses 2+.   ECG: ECG is ordered today.  Other Studies Reviewed Today:  Echocardiogram 05/08/2015: Study Conclusions  - Left ventricle: The cavity size was normal. Wall thickness was increased in a pattern  of moderate LVH. Systolic function was vigorous. The estimated ejection fraction was in the range of 65% to 70%. Wall motion was normal; there were no regional wall motion abnormalities. Features are consistent with a pseudonormal left ventricular filling pattern, with concomitant abnormal relaxation and increased filling pressure (grade 2 diastolic dysfunction). - Aortic valve: Moderately to severely calcified annulus. Trileaflet; moderately thickened leaflets. There was moderate to severe stenosis. There was mild to moderate regurgitation. The AI VC is 0.2 cm . Mean gradient (S): 29 mm Hg. VTI ratio of LVOT to aortic valve: 0.33. Valve area (VTI): 1.03 cm^2. Regurgitation pressure half-time: 409 ms. - Aorta: Limited visualization of the proximal ascending aorta. The visualized proximal portion appears to be mild to moderately dilated at 4.4 cm. Cannot rule out more prominent dilatation in the more distal ascending aorta. Consider CTA or MRA to better define anatomy of the aorta. - Mitral valve: Mildly calcified annulus. Mildly thickened leaflets . There was mild regurgitation. - Left atrium: The atrium was moderately dilated. - Right atrium: The atrium was mildly dilated. - Atrial septum: No defect or patent foramen ovale was identified. - Pulmonary arteries: Systolic pressure was mildly increased. PA peak pressure: 35 mm Hg (S). - Technically adequate study.  Assessment and Plan:  1. Paroxysmal ectopic atrial tachycardia. She is symptomatically stable on current regimen, feeling better overall. Plan is to reduce amiodarone to 2 mg once daily and otherwise continue her present cardiac regimen.  2. Moderately severe aortic stenosis, already evaluated by Dr. Burt Knack for potential TAVR depending on progression and symptomatology. Most recent echocardiogram from October is noted above. Continue observation.  Current medicines were reviewed with the  patient today.   Orders Placed This Encounter  Procedures  . EKG 12-Lead    Disposition: FU with me in 6 weeks.   Signed, Satira Sark, MD, [facility] 08/27/2015 1:30 PM    [place] at [facility] [address] [place], [place] [address] Phone: [phone]; Fax: [phone]

## 2015-08-27 NOTE — Patient Instructions (Signed)
Your physician recommends that you schedule a follow-up appointment in: 6 weeks [place] office with Dr.McDowell    DECREASE Amiodarone to 200 mg daily   If you need a refill on your cardiac medications before your next appointment, please call your pharmacy.    Thank you for choosing [facility] !

## 2015-09-11 ENCOUNTER — Ambulatory Visit: Payer: Medicare Other | Admitting: Cardiology

## 2015-09-16 ENCOUNTER — Telehealth: Payer: Self-pay | Admitting: Cardiology

## 2015-09-16 NOTE — Telephone Encounter (Signed)
amiodarone (PACERONE) 200 MG tablet Has a question about dosage and how to take medication. She was told to increase medication. Now she only has 5 pills left and they will not refill the medication until 25th

## 2015-09-16 NOTE — Telephone Encounter (Signed)
Patient informed that new rx was sent to pharmacy on 08/30/2015 at her last OV in [place].  Patient verbalized understanding.

## 2015-09-16 NOTE — Telephone Encounter (Signed)
Patient wants a call on cell phone if not home

## 2015-10-09 ENCOUNTER — Encounter: Payer: Self-pay | Admitting: Cardiology

## 2015-10-09 ENCOUNTER — Ambulatory Visit (INDEPENDENT_AMBULATORY_CARE_PROVIDER_SITE_OTHER): Payer: Medicare Other | Admitting: Cardiology

## 2015-10-09 VITALS — BP 102/80 | HR 108 | Ht 64.0 in | Wt 157.0 lb

## 2015-10-09 DIAGNOSIS — I10 Essential (primary) hypertension: Secondary | ICD-10-CM | POA: Diagnosis not present

## 2015-10-09 DIAGNOSIS — E039 Hypothyroidism, unspecified: Secondary | ICD-10-CM

## 2015-10-09 DIAGNOSIS — I350 Nonrheumatic aortic (valve) stenosis: Secondary | ICD-10-CM | POA: Diagnosis not present

## 2015-10-09 DIAGNOSIS — I471 Supraventricular tachycardia, unspecified: Secondary | ICD-10-CM

## 2015-10-09 NOTE — Progress Notes (Signed)
Cardiology Office Note  Date: 10/09/2015   ID: Christine Pugh, DOB 1922/07/08, MRN [id number]  PCP: Gar Ponto, MD  Primary Cardiologist: Rozann Lesches, MD   Chief Complaint  Patient presents with  . SVT  . Aortic Stenosis    History of Present Illness: Christine Pugh is a 82 y.o. female last seen in January. She presents for a routine follow-up visit. Will functional perspective, she does not report any major decline, continues to swim at the [facility] when she is able. She does not report any angina symptoms and has NYHA class II dyspnea with typical ADLs.  She states that her heart rate remains elevated most of the time between 100-110. She does have episodes when it goes down to the 60s, but this is only for perhaps a few hours. She has had no dizziness or sudden syncope.  We went over her medications. At this point she continues on aspirin, amiodarone 200 mg daily, and Toprol-XL.  She will be having a follow-up echocardiogram to reassess aortic stenosis in April of this year, has already seen Dr. Burt Knack for consideration of potential TAVR, with watchful waiting strategy.  Heart rate and rhythm control remain a problem that has not settled down as yet. Her ECG is suggestive of either an ectopic atrial tachycardia or atypical atrial flutter. She has not had any definite atrial fibrillation. CHADSVASC score is 4.  Past Medical History  Diagnosis Date  . Aortic insufficiency     Mild  . Aortic stenosis     Moderate 03/2013  . PSVT (paroxysmal supraventricular tachycardia) ([place])   . Benign essential HTN   . Hypothyroidism     Past Surgical History  Procedure Laterality Date  . Thyroidectomy    . Total abdominal hysterectomy    . Appendectomy    . Tonsillectomy    . Vitrectomy and cataract    . Dental implant      Current Outpatient Prescriptions  Medication Sig Dispense Refill  . amiodarone (PACERONE) 200 MG tablet Take 1 tablet (200 mg total) by mouth daily. 90  tablet 3  . aspirin EC 81 MG tablet Take 81 mg by mouth daily.    . cyproheptadine (PERIACTIN) 4 MG tablet Take 4 mg by mouth 3 (three) times daily as needed for allergies. Reported on 08/27/2015    . famotidine (PEPCID) 20 MG tablet Take 20 mg by mouth 2 (two) times daily.    Marland Kitchen levothyroxine (SYNTHROID, LEVOTHROID) 112 MCG tablet Take 112 mcg by mouth daily before breakfast.    . Lutein-Zeaxanthin 25-5 MG CAPS Take 1 capsule by mouth 2 (two) times daily.    . metoprolol succinate (TOPROL-XL) 25 MG 24 hr tablet Take 1 tablet (25 mg total) by mouth 2 (two) times daily. 180 tablet 3  . Multiple Vitamin (MULTIVITAMIN) tablet Take 1 tablet by mouth daily.      . Red Yeast Rice 600 MG TABS Take 1 tablet by mouth daily. Reported on 08/13/2015    . triamcinolone cream (KENALOG) 0.1 % Apply 1 application topically as needed (irritated skin).     Marland Kitchen triamterene-hydrochlorothiazide (DYAZIDE) 37.5-25 MG per capsule Take 1 capsule by mouth daily.       No current facility-administered medications for this visit.   Allergies:  Review of patient's allergies indicates no known allergies.   Social History: The patient  reports that she has never smoked. She has never used smokeless tobacco. She reports that she does not drink alcohol or use  illicit drugs.   ROS:  Please see the history of present illness. Otherwise, complete review of systems is positive for arthritic pains.  All other systems are reviewed and negative.   Physical Exam: VS:  BP 102/80 mmHg  Pulse 108  Ht 5\' 4"  (1.626 m)  Wt 157 lb (71.215 kg)  BMI 26.94 kg/m2  SpO2 97%, BMI Body mass index is 26.94 kg/(m^2).  Wt Readings from Last 3 Encounters:  10/09/15 157 lb (71.215 kg)  08/27/15 150 lb (68.04 kg)  08/13/15 150 lb (68.04 kg)    Normally nourished appearing elderly woman, appears comfortable at rest. HEENT: Conjunctiva and lids normal, oropharynx clear.  Neck: Supple, no carotid bruits, no thyromegaly.  Lungs: Clear to  auscultation, nonlabored.  Cardiac: Irregular, 3/6 systolic murmur heard best at the base, no loud diastolic murmur, no S3.  Abdomen: Soft, nontender.  Extremities: No leg edema, distal pulses 2+.  Neuropsychiatric: Alert and oriented 3, affect appropriate.  ECG: I personally reviewed the prior tracing from 08/27/2015 showing a probable ectopic atrial tachycardia with aberrantly conducted complexes versus atypical atrial flutter and incomplete right bundle branch block.  Other Studies Reviewed Today:  Echocardiogram 05/08/2015: Study Conclusions  - Left ventricle: The cavity size was normal. Wall thickness was increased in a pattern of moderate LVH. Systolic function was vigorous. The estimated ejection fraction was in the range of 65% to 70%. Wall motion was normal; there were no regional wall motion abnormalities. Features are consistent with a pseudonormal left ventricular filling pattern, with concomitant abnormal relaxation and increased filling pressure (grade 2 diastolic dysfunction). - Aortic valve: Moderately to severely calcified annulus. Trileaflet; moderately thickened leaflets. There was moderate to severe stenosis. There was mild to moderate regurgitation. The AI VC is 0.2 cm . Mean gradient (S): 29 mm Hg. VTI ratio of LVOT to aortic valve: 0.33. Valve area (VTI): 1.03 cm^2. Regurgitation pressure half-time: 409 ms. - Aorta: Limited visualization of the proximal ascending aorta. The visualized proximal portion appears to be mild to moderately dilated at 4.4 cm. Cannot rule out more prominent dilatation in the more distal ascending aorta. Consider CTA or MRA to better define anatomy of the aorta. - Mitral valve: Mildly calcified annulus. Mildly thickened leaflets . There was mild regurgitation. - Left atrium: The atrium was moderately dilated. - Right atrium: The atrium was mildly dilated. - Atrial septum: No defect or patent  foramen ovale was identified. - Pulmonary arteries: Systolic pressure was mildly increased. PA peak pressure: 35 mm Hg (S). - Technically adequate study.  Assessment and Plan:  1. Paroxysmal to persistent ectopic atrial tachycardia versus atypical atrial flutter with variable conduction. We have initiated oral amiodarone and her rhythm persists, although it sounds like she does have some periods of time when her heart rate goes to the 60s potentially back in normal rhythm. She has had no dizziness or syncope. CHADSVASC score is 4. I have not pushed anticoagulation as yet, and she would need Coumadin in light of significant valvular heart disease. She is an active 82 year old woman, swims laps at the [facility], and has already been considered for possible TAVR by Dr. Burt Knack with watchful waiting strategy. We will get an EP consultation to see if she might be a potential candidate for ablation with cardioversion. I did address Coumadin with her in case those options were pursued, and all although not enthusiastic, she is willing to consider it.  2. Moderate to severe aortic stenosis, due for follow-up echocardiogram in April.  3.  Essential hypertension, blood pressure well controlled today.  4. Hypothyroidism, on Synthroid. She is followed by Dr. Quillian Quince.  Current medicines were reviewed with the patient today.   Orders Placed This Encounter  Procedures  . Ambulatory referral to Cardiac Electrophysiology    Disposition: FU with me in 2 months.   Signed, Satira Sark, MD, [facility] 10/09/2015 3:02 PM    [place] [facility] [place], [place], [place] [address] Phone: [phone]; Fax: [phone]

## 2015-10-09 NOTE — Patient Instructions (Signed)
Your physician recommends that you continue on your current medications as directed. Please refer to the Current Medication list given to you today. You have been referred to an Electrophysiologist Cardiologist in [place]. Your physician recommends that you schedule a follow-up appointment in: 2 months.

## 2015-10-28 ENCOUNTER — Encounter: Payer: Self-pay | Admitting: Internal Medicine

## 2015-10-28 ENCOUNTER — Ambulatory Visit (INDEPENDENT_AMBULATORY_CARE_PROVIDER_SITE_OTHER): Payer: Medicare Other | Admitting: Internal Medicine

## 2015-10-28 VITALS — BP 106/74 | HR 104 | Ht 64.0 in | Wt 153.4 lb

## 2015-10-28 DIAGNOSIS — I10 Essential (primary) hypertension: Secondary | ICD-10-CM | POA: Insufficient documentation

## 2015-10-28 DIAGNOSIS — I484 Atypical atrial flutter: Secondary | ICD-10-CM | POA: Insufficient documentation

## 2015-10-28 DIAGNOSIS — I471 Supraventricular tachycardia: Secondary | ICD-10-CM

## 2015-10-28 MED ORDER — METOPROLOL SUCCINATE ER 50 MG PO TB24: ORAL_TABLET | ORAL | Status: DC

## 2015-10-28 NOTE — Progress Notes (Signed)
Electrophysiology Office Note   Date:  10/28/2015   ID:  KHELSEY KEPLER, DOB 1921/11/04, MRN [id number]  PCP:  Gar Ponto, MD  Cardiologist:  Dr Domenic Polite,  Dr Burt Knack also sees for aortic stenosis Primary Electrophysiologist: [name], MD    Chief Complaint  Patient presents with  . New Patient (Initial Visit)  . Atrial Flutter     History of Present Illness: Christine Pugh is a 82 y.o. female who presents today for electrophysiology evaluation.   She has a h/o aortic stenosis and is followed by Drs Domenic Polite and Burt Knack.  She reports that she has had "tachycardia" for 3-4 years.  She was treated with metoprolol initially and has done well, without any symptoms.  She wore an event monitor after a fall 9/16 which did not reveal any arrhythmias.  She presented to see Dr Domenic Polite 08/2015.  She was noted to have an atrial tachycardia vs atypical atrial flutter at 130 bpm at that time for which she was unaware.  She was placed on amiodarone for rate control.  She has had several ekgs but remains in an atypical atrial flutter.   She remains active for her age.  She continues to swim at that [facility].  Today, she denies symptoms of palpitations, chest pain, shortness of breath, orthopnea, PND, lower extremity edema, claudication, dizziness, presyncope, syncope, bleeding, or neurologic sequela. The patient is tolerating medications without difficulties and is otherwise without complaint today.    Past Medical History  Diagnosis Date  . Aortic insufficiency     Mild  . Aortic stenosis     moderate to severe  . Atypical atrial flutter ([place])   . Benign essential HTN   . Hypothyroidism    Past Surgical History  Procedure Laterality Date  . Thyroidectomy    . Total abdominal hysterectomy    . Appendectomy    . Tonsillectomy    . Vitrectomy and cataract    . Dental implant    . Breast lumpectomy       Current Outpatient Prescriptions  Medication Sig Dispense Refill  . amiodarone  (PACERONE) 200 MG tablet Take 1 tablet (200 mg total) by mouth daily. 90 tablet 3  . aspirin EC 81 MG tablet Take 81 mg by mouth daily.    . cyproheptadine (PERIACTIN) 4 MG tablet Take 4 mg by mouth 3 (three) times daily as needed for allergies. Reported on 08/27/2015    . levothyroxine (SYNTHROID, LEVOTHROID) 112 MCG tablet Take 112 mcg by mouth daily before breakfast.    . Lutein-Zeaxanthin 25-5 MG CAPS Take 1 capsule by mouth 2 (two) times daily.    . metoprolol succinate (TOPROL-XL) 25 MG 24 hr tablet Take 1 tablet (25 mg total) by mouth 2 (two) times daily. 180 tablet 3  . Multiple Vitamin (MULTIVITAMIN) tablet Take 1 tablet by mouth daily.      . Red Yeast Rice 600 MG TABS Take 1 tablet by mouth daily. Reported on 08/13/2015    . triamcinolone cream (KENALOG) 0.1 % Apply 1 application topically as needed (irritated skin).     Marland Kitchen triamterene-hydrochlorothiazide (DYAZIDE) 37.5-25 MG per capsule Take 1 capsule by mouth daily.       No current facility-administered medications for this visit.    Allergies:   Review of patient's allergies indicates no known allergies.   Social History:  The patient  reports that she has never smoked. She has never used smokeless tobacco. She reports that she does not drink alcohol  or use illicit drugs.   Family History:  The patient's FH includes CAD   ROS:  Please see the history of present illness.   All other systems are reviewed and negative.    PHYSICAL EXAM: VS:  BP 106/74 mmHg  Pulse 104  Ht 5\' 4"  (1.626 m)  Wt 153 lb 6.4 oz (69.582 kg)  BMI 26.32 kg/m2 , BMI Body mass index is 26.32 kg/(m^2). GEN: Well nourished, well developed, in no acute distress HEENT: normal Neck: no JVD, carotid bruits, or masses Cardiac: iRRR; 2/6 SEM LUSB  With audible S2,  trace edema  Respiratory:  clear to auscultation bilaterally, normal work of breathing GI: soft, nontender, nondistended, + BS MS: no deformity or atrophy Skin: warm and dry  Neuro:  Strength  and sensation are intact Psych: euthymic mood, full affect  EKG:  EKG is ordered today. The ekg ordered today shows atypical atrial flutter at 104 bpm, QRS 96 msec, LAD, PVCs    Wt Readings from Last 3 Encounters:  10/28/15 153 lb 6.4 oz (69.582 kg)  10/09/15 157 lb (71.215 kg)  08/27/15 150 lb (68.04 kg)      Other studies Reviewed: Additional studies/ records that were reviewed today include: Dr Chuck Hint notes, prior echos, prior monitor, prior ekgs, Dr York Cerise notes   ASSESSMENT AND PLAN:  1.  Atypical atrial flutter No real symptoms at this time Therapeutic strategies for her arrhythmia including rate and rhythm control were discussed in detail with the patient today.  Pros and Cons of each approach were discussed.  She is not a candidate for EP procedures such as ablation but could be considered for cardioversion. At this time, she would favor rate control.  I will increase toprol to 50mg  qam, 25mg  qpm and continue amiodarone for rate control. This patients CHA2DS2-VASc Score and unadjusted Ischemic Stroke Rate (% per year) is equal to 4.8 % stroke rate/year from a score of 4. Today, I discussed Coumadin as well as novel anticoagulants including Pradaxa, Xarelto, Savaysa, and Eliquis today as indicated for risk reduction in stroke and systemic emboli with nonvalvular atrial fibrillation.  Risks, benefits, and alternatives to each of these drugs were discussed at length today. Today, I discussed Coumadin as well as novel anticoagulants including Pradaxa, Xarelto, Savaysa, and Eliquis today as indicated for risk reduction in stroke and systemic emboli with nonvalvular atrial fibrillation.  Risks, benefits, and alternatives to each of these drugs were discussed at length today.  She is clear that she would like to avoid anticoagulation at this time.  She will therefore continue with rate control and follow-up with Dr Domenic Polite as scheduled  2. HTN Stable No change required  today  I will see as needed going forward     Signed, [name], MD  10/28/2015 9:19 AM     [facility] [address] [place] [address] [phone] (office) [phone] (fax)

## 2015-10-28 NOTE — Patient Instructions (Signed)
Medication Instructions:  Your physician has recommended you make the following change in your medication:  1) Increase Metoprolol to 50 mg in the am and 25 mg in the pm   Labwork: None ordered   Testing/Procedures: None ordered   Follow-Up: Your physician recommends that you schedule a follow-up appointment as needed   Any Other Special Instructions Will Be Listed Below (If Applicable).     If you need a refill on your cardiac medications before your next appointment, please call your pharmacy.

## 2015-11-06 ENCOUNTER — Other Ambulatory Visit: Payer: Self-pay

## 2015-11-06 ENCOUNTER — Ambulatory Visit (INDEPENDENT_AMBULATORY_CARE_PROVIDER_SITE_OTHER): Payer: Medicare Other

## 2015-11-06 DIAGNOSIS — I350 Nonrheumatic aortic (valve) stenosis: Secondary | ICD-10-CM

## 2015-11-11 ENCOUNTER — Encounter: Payer: Self-pay | Admitting: *Deleted

## 2015-11-12 ENCOUNTER — Telehealth: Payer: Self-pay | Admitting: *Deleted

## 2015-11-12 NOTE — Telephone Encounter (Signed)
-----   Message from Satira Sark, MD sent at 11/06/2015  6:11 PM EDT ----- Reviewed report. No obvious progression in degree of aortic stenosis (moderate to severe). The transvalvular gradient is actually lower compared to the previous study, but this likely relates to differences in study technique or hemodynamics rather than improvement in aortic stenosis. Will continue to observe.

## 2015-11-13 NOTE — Telephone Encounter (Signed)
Patient informed. 

## 2015-12-10 ENCOUNTER — Ambulatory Visit (INDEPENDENT_AMBULATORY_CARE_PROVIDER_SITE_OTHER): Payer: Medicare Other | Admitting: Cardiology

## 2015-12-10 ENCOUNTER — Encounter: Payer: Self-pay | Admitting: Cardiology

## 2015-12-10 VITALS — BP 100/64 | HR 94 | Ht 65.0 in | Wt 155.0 lb

## 2015-12-10 DIAGNOSIS — I484 Atypical atrial flutter: Secondary | ICD-10-CM

## 2015-12-10 DIAGNOSIS — I350 Nonrheumatic aortic (valve) stenosis: Secondary | ICD-10-CM | POA: Diagnosis not present

## 2015-12-10 MED ORDER — AMIODARONE HCL 200 MG PO TABS: 200.0000 mg | ORAL_TABLET | Freq: Every day | ORAL | Status: DC

## 2015-12-10 NOTE — Progress Notes (Signed)
Cardiology Office Note  Date: 12/10/2015   ID: Christine Pugh, DOB 1921/10/06, MRN [id number]  PCP: Gar Ponto, MD  Primary Cardiologist: Rozann Lesches, MD   Chief Complaint  Patient presents with  . Atypical atrial flutter  . Aortic Stenosis    History of Present Illness: Christine Pugh is a 82 y.o. female last seen in March. Interval evaluation by Dr. Rayann Heman noted, I reviewed his consultation. Recommendation was for heart rate control and no EP intervention. She presents today for a follow-up visit, states that she feels "good." She is still swimming regularly at the [facility], participated in the local Senior games.  She does not endorse any palpitations, states that her heart rate is generally in the 70s to 80s at rest at home. She has persisted in atypical atrial flutter which we are controlling in terms of heart rate with Toprol-XL and amiodarone. No plan for cardioversion as she declines anticoagulation.  Follow-up echocardiogram is reviewed below showing overall stable aortic stenosis.  Past Medical History  Diagnosis Date  . Aortic insufficiency     Mild  . Aortic stenosis     moderate to severe  . Atypical atrial flutter ([place])   . Benign essential HTN   . Hypothyroidism     Current Outpatient Prescriptions  Medication Sig Dispense Refill  . amiodarone (PACERONE) 200 MG tablet Take 1 tablet (200 mg total) by mouth daily. 90 tablet 3  . aspirin EC 81 MG tablet Take 81 mg by mouth daily.    . cyproheptadine (PERIACTIN) 4 MG tablet Take 4 mg by mouth 3 (three) times daily as needed for allergies. Reported on 08/27/2015    . levothyroxine (SYNTHROID, LEVOTHROID) 112 MCG tablet Take 112 mcg by mouth daily before breakfast.    . Lutein-Zeaxanthin 25-5 MG CAPS Take 1 capsule by mouth 2 (two) times daily.    . metoprolol succinate (TOPROL-XL) 50 MG 24 hr tablet Take 50 mg by mouth in the am and 25 mg by mouth in the pm 135 tablet 3  . Multiple Vitamin (MULTIVITAMIN)  tablet Take 1 tablet by mouth daily.      . Red Yeast Rice 600 MG TABS Take 1 tablet by mouth daily. Reported on 08/13/2015    . triamcinolone cream (KENALOG) 0.1 % Apply 1 application topically as needed (irritated skin).     Marland Kitchen triamterene-hydrochlorothiazide (DYAZIDE) 37.5-25 MG per capsule Take 1 capsule by mouth daily.       No current facility-administered medications for this visit.   Allergies:  Review of patient's allergies indicates no known allergies.   Social History: The patient  reports that she has never smoked. She has never used smokeless tobacco. She reports that she does not drink alcohol or use illicit drugs.   ROS:  Please see the history of present illness. Otherwise, complete review of systems is positive for arthritic stiffness.  All other systems are reviewed and negative.   Physical Exam: VS:  BP 100/64 mmHg  Pulse 94  Ht 5\' 5"  (1.651 m)  Wt 155 lb (70.308 kg)  BMI 25.79 kg/m2  SpO2 96%, BMI Body mass index is 25.79 kg/(m^2).  Wt Readings from Last 3 Encounters:  12/10/15 155 lb (70.308 kg)  10/28/15 153 lb 6.4 oz (69.582 kg)  10/09/15 157 lb (71.215 kg)    Normally nourished appearing elderly woman, appears comfortable at rest. HEENT: Conjunctiva and lids normal, oropharynx clear.  Neck: Supple, no carotid bruits, no thyromegaly.  Lungs: Clear to  auscultation, nonlabored.  Cardiac: Irregular, 3/6 systolic murmur heard best at the base, no loud diastolic murmur, no S3.  Abdomen: Soft, nontender.  Extremities: No leg edema, distal pulses 2+.  Neuropsychiatric: Alert and oriented 3, affect appropriate.  ECG: I personally reviewed the prior tracing from 10/28/2015 which showed probable atypical atrial flutter with 2:1 block and left anterior fascicular block.  Other Studies Reviewed Today:  Echocardiogram 11/06/2015: Study Conclusions  - Left ventricle: The cavity size was normal. Wall thickness was  increased in a pattern of mild LVH. Systolic  function was normal.  The estimated ejection fraction was in the range of 60% to 65%.  Wall motion was normal; there were no regional wall motion  abnormalities. The study is not technically sufficient to allow  evaluation of LV diastolic function. - Aortic valve: While peak velocity and mean gradients do not  support this, there appears to be moderate to severe aortic  stenosis, which was noted on echocardiography on 05/17/14 as  well. Moderately to severely calcified annulus. Moderately  thickened, severely calcified leaflets. Cusp separation was  reduced. Peak velocity (S): 267 cm/s. Mean gradient (S): 17 mm  Hg. Valve area (VTI): 0.8 cm^2. Valve area (Vmax): 0.82 cm^2.  Valve area (Vmean): 0.81 cm^2. - Mitral valve: Moderately calcified annulus. Mildly calcified  leaflets . There was moderate eccentric regurgitation. - Left atrium: The atrium was moderately to severely dilated. - Right ventricle: Systolic function was mildly reduced. - Right atrium: The atrium was mildly dilated. - Atrial septum: There was increased thickness of the septum,  consistent with lipomatous hypertrophy. No defect or patent  foramen ovale was identified.  Assessment and Plan:  1. Atypical atrial flutter. Plan to continue current medical therapy including amiodarone at 200 mg once a daily and Toprol-XL. She declines anticoagulation and remains on aspirin. EP consultation by Dr. Rayann Heman reviewed.  2. Moderate to severe aortic stenosis, overall stable by recent follow-up echocardiogram. Continue observation with follow-up study in 6 months. She has already been assessed by Dr. Burt Knack for possibility of TAVR.  Current medicines were reviewed with the patient today.   Orders Placed This Encounter  Procedures  . ECHOCARDIOGRAM COMPLETE    Disposition: FU with me in 6 months.   Signed, Satira Sark, MD, [facility] 12/10/2015 1:38 PM    [facility] [place], [place], [place] [address] Phone: [phone]; Fax: [phone]

## 2015-12-10 NOTE — Patient Instructions (Signed)
Your physician has recommended you make the following change in your medication:  Change amiodarone to 200 mg daily. Continue all other medications the same. Your physician has requested that you have an echocardiogram in 6 months. Echocardiography is a painless test that uses sound waves to create images of your heart. It provides your doctor with information about the size and shape of your heart and how well your heart's chambers and valves are working. This procedure takes approximately one hour. There are no restrictions for this procedure. Your physician recommends that you schedule a follow-up appointment in: 6 months. You will receive a reminder letter in the mail in about 4 months reminding you to call and schedule your appointment. If you don't receive this letter, please contact our office.

## 2015-12-19 ENCOUNTER — Encounter (HOSPITAL_COMMUNITY): Payer: Self-pay | Admitting: Emergency Medicine

## 2015-12-19 ENCOUNTER — Emergency Department (HOSPITAL_COMMUNITY)
Admission: EM | Admit: 2015-12-19 | Discharge: 2015-12-19 | Disposition: A | Discharge status: Home / Self Care | Payer: Medicare Other | Attending: Emergency Medicine | Admitting: Emergency Medicine

## 2015-12-19 ENCOUNTER — Emergency Department (HOSPITAL_COMMUNITY): Payer: Medicare Other

## 2015-12-19 DIAGNOSIS — E039 Hypothyroidism, unspecified: Secondary | ICD-10-CM | POA: Diagnosis not present

## 2015-12-19 DIAGNOSIS — Y9301 Activity, walking, marching and hiking: Secondary | ICD-10-CM | POA: Diagnosis not present

## 2015-12-19 DIAGNOSIS — Z23 Encounter for immunization: Secondary | ICD-10-CM | POA: Diagnosis not present

## 2015-12-19 DIAGNOSIS — S0990XA Unspecified injury of head, initial encounter: Secondary | ICD-10-CM | POA: Diagnosis not present

## 2015-12-19 DIAGNOSIS — R001 Bradycardia, unspecified: Secondary | ICD-10-CM | POA: Diagnosis not present

## 2015-12-19 DIAGNOSIS — R55 Syncope and collapse: Secondary | ICD-10-CM | POA: Diagnosis not present

## 2015-12-19 DIAGNOSIS — Y92481 Parking lot as the place of occurrence of the external cause: Secondary | ICD-10-CM | POA: Diagnosis not present

## 2015-12-19 DIAGNOSIS — R4020 Unspecified coma: Secondary | ICD-10-CM

## 2015-12-19 DIAGNOSIS — Y999 Unspecified external cause status: Secondary | ICD-10-CM | POA: Diagnosis not present

## 2015-12-19 DIAGNOSIS — IMO0002 Reserved for concepts with insufficient information to code with codable children: Secondary | ICD-10-CM

## 2015-12-19 DIAGNOSIS — I10 Essential (primary) hypertension: Secondary | ICD-10-CM | POA: Diagnosis not present

## 2015-12-19 DIAGNOSIS — Z7982 Long term (current) use of aspirin: Secondary | ICD-10-CM | POA: Insufficient documentation

## 2015-12-19 DIAGNOSIS — S0181XA Laceration without foreign body of other part of head, initial encounter: Secondary | ICD-10-CM | POA: Insufficient documentation

## 2015-12-19 DIAGNOSIS — W228XXA Striking against or struck by other objects, initial encounter: Secondary | ICD-10-CM | POA: Insufficient documentation

## 2015-12-19 DIAGNOSIS — Z79899 Other long term (current) drug therapy: Secondary | ICD-10-CM | POA: Insufficient documentation

## 2015-12-19 LAB — COMPREHENSIVE METABOLIC PANEL
ALBUMIN: 3.9 g/dL (ref 3.5–5.0)
ALK PHOS: 59 U/L (ref 38–126)
ALT: 18 U/L (ref 14–54)
AST: 28 U/L (ref 15–41)
Anion gap: 9 (ref 5–15)
BUN: 17 mg/dL (ref 6–20)
CALCIUM: 9.5 mg/dL (ref 8.9–10.3)
CO2: 26 mmol/L (ref 22–32)
CREATININE: 0.73 mg/dL (ref 0.44–1.00)
Chloride: 99 mmol/L — ABNORMAL LOW (ref 101–111)
GFR calc Af Amer: >60 mL/min (ref >60)
GFR calc non Af Amer: >60 mL/min (ref >60)
GLUCOSE: 95 mg/dL (ref 65–99)
Potassium: 3.9 mmol/L (ref 3.5–5.1)
SODIUM: 134 mmol/L — AB (ref 135–145)
Total Bilirubin: 0.8 mg/dL (ref 0.3–1.2)
Total Protein: 6.6 g/dL (ref 6.5–8.1)

## 2015-12-19 LAB — CBC WITH DIFFERENTIAL/PLATELET
BASOS PCT: 0 %
Basophils Absolute: 0 10*3/uL (ref 0.0–0.1)
EOS ABS: 0.1 10*3/uL (ref 0.0–0.7)
Eosinophils Relative: 2 %
HCT: 46 % (ref 36.0–46.0)
HEMOGLOBIN: 15.3 g/dL — AB (ref 12.0–15.0)
LYMPHS ABS: 1.2 10*3/uL (ref 0.7–4.0)
Lymphocytes Relative: 15 %
MCH: 32.3 pg (ref 26.0–34.0)
MCHC: 33.3 g/dL (ref 30.0–36.0)
MCV: 97.3 fL (ref 78.0–100.0)
Monocytes Absolute: 0.8 10*3/uL (ref 0.1–1.0)
Monocytes Relative: 10 %
NEUTROS PCT: 73 %
Neutro Abs: 5.9 10*3/uL (ref 1.7–7.7)
Platelets: 190 10*3/uL (ref 150–400)
RBC: 4.73 MIL/uL (ref 3.87–5.11)
RDW: 13.5 % (ref 11.5–15.5)
WBC: 8 10*3/uL (ref 4.0–10.5)

## 2015-12-19 LAB — TROPONIN I

## 2015-12-19 MED ORDER — LIDOCAINE-EPINEPHRINE-TETRACAINE (LET) SOLUTION
3.0000 mL | Freq: Once | NASAL | Status: AC
  Administered 2015-12-19: 3 mL via TOPICAL
  Filled 2015-12-19: qty 3

## 2015-12-19 MED ORDER — TETANUS-DIPHTH-ACELL PERTUSSIS 5-2.5-18.5 LF-MCG/0.5 IM SUSP
0.5000 mL | Freq: Once | INTRAMUSCULAR | Status: DC
  Filled 2015-12-19: qty 0.5

## 2015-12-19 NOTE — ED Notes (Signed)
PT stated she drove to the [facility] this morning and was walking in the parking lot and had a syncopal episode and hit her head and left elbow on sidewalk. PT is having episodes of bradycardia in the 40s this am with history of same. PT alert and oriented. PT states she takes a 81mg  aspirin daily. PT denies any SOB, visual changes but states a headache.

## 2015-12-19 NOTE — ED Provider Notes (Signed)
CSN: [id number]     Arrival date & time 12/19/15  1114 History   First MD Initiated Contact with Patient 12/19/15 1203     Chief Complaint  Patient presents with  . Loss of Consciousness     (Consider location/radiation/quality/duration/timing/severity/associated sxs/prior Treatment) Patient is a 82 y.o. female presenting with syncope.  Loss of Consciousness Episode history:  Multiple Most recent episode:  Today Timing:  Intermittent Progression:  Resolved Chronicity:  Recurrent Context: not blood draw   Relieved by:  None tried Worsened by:  Nothing tried Ineffective treatments:  None tried Associated symptoms: no chest pain, no fever, no headaches and no shortness of breath     Past Medical History  Diagnosis Date  . Aortic insufficiency     Mild  . Aortic stenosis     moderate to severe  . Atypical atrial flutter ([place])   . Benign essential HTN   . Hypothyroidism    Past Surgical History  Procedure Laterality Date  . Thyroidectomy    . Total abdominal hysterectomy    . Appendectomy    . Tonsillectomy    . Vitrectomy and cataract    . Dental implant    . Breast lumpectomy     Family History  Problem Relation Age of Onset  . Coronary artery disease     Social History  Substance Use Topics  . Smoking status: Never Smoker   . Smokeless tobacco: Never Used  . Alcohol Use: No   OB History    No data available     Review of Systems  Constitutional: Negative for fever.  HENT: Negative for congestion and facial swelling.   Eyes: Negative for discharge and redness.  Respiratory: Negative for cough and shortness of breath.   Cardiovascular: Positive for syncope. Negative for chest pain.  Gastrointestinal: Negative for abdominal pain and abdominal distention.  Endocrine: Negative for polydipsia.  Genitourinary: Negative for dysuria.  Musculoskeletal: Negative for back pain.  Skin: Positive for wound.  Neurological: Positive for syncope. Negative for  headaches.  All other systems reviewed and are negative.     Allergies  Review of patient's allergies indicates no known allergies.  Home Medications   Prior to Admission medications   Medication Sig Start Date End Date Taking? Authorizing Provider  amiodarone (PACERONE) 200 MG tablet Take 1 tablet (200 mg total) by mouth daily. 12/10/15  Yes Satira Sark, MD  aspirin EC 81 MG tablet Take 81 mg by mouth daily.   Yes Historical Provider, MD  cyproheptadine (PERIACTIN) 4 MG tablet Take 4 mg by mouth 3 (three) times daily as needed for allergies. Reported on 08/27/2015   Yes Historical Provider, MD  levothyroxine (SYNTHROID, LEVOTHROID) 112 MCG tablet Take 112 mcg by mouth daily before breakfast.   Yes Historical Provider, MD  Lutein-Zeaxanthin 25-5 MG CAPS Take 1 capsule by mouth 2 (two) times daily.   Yes Historical Provider, MD  metoprolol succinate (TOPROL-XL) 50 MG 24 hr tablet Take 50 mg by mouth in the am and 25 mg by mouth in the pm 10/28/15  Yes [name], MD  Multiple Vitamin (MULTIVITAMIN) tablet Take 1 tablet by mouth daily.     Yes Historical Provider, MD  Red Yeast Rice 600 MG TABS Take 1 tablet by mouth daily. Reported on 08/13/2015   Yes Historical Provider, MD  triamcinolone cream (KENALOG) 0.1 % Apply 1 application topically as needed (irritated skin).  09/07/12  Yes Historical Provider, MD  triamterene-hydrochlorothiazide (DYAZIDE) 37.5-25 MG per capsule  Take 1 capsule by mouth daily.     Yes Historical Provider, MD   BP 126/96 mmHg  Pulse 115  Temp(Src) 98.2 F (36.8 C) (Oral)  Resp 20  Ht 5\' 5"  (1.651 m)  Wt 150 lb (68.04 kg)  BMI 24.96 kg/m2  SpO2 95% Physical Exam  Constitutional: She is oriented to person, place, and time. She appears well-developed and well-nourished.  HENT:  Head: Normocephalic and atraumatic.  Neck: Normal range of motion.  Cardiovascular: Normal rate and regular rhythm.   Pulmonary/Chest: No stridor. No respiratory distress.   Abdominal: She exhibits no distension.  Musculoskeletal: Normal range of motion. She exhibits no edema or tenderness.  Neurological: She is alert and oriented to person, place, and time.  No altered mental status, able to give full seemingly accurate history.  Face is symmetric, EOM's intact, pupils equal and reactive, vision intact, tongue and uvula midline without deviation Upper and Lower extremity motor 5/5, intact pain perception in distal extremities, 2+ reflexes in biceps, patella and achilles tendons. Finger to nose normal, heel to shin normal. Walks without assistance or evident ataxia.   Skin:  Laceration, stellate, hemostatic to left forehead  Nursing note and vitals reviewed.   ED Course  Procedures (including critical care time) Labs Review Labs Reviewed  CBC WITH DIFFERENTIAL/PLATELET - Abnormal; Notable for the following:    Hemoglobin 15.3 (*)    All other components within normal limits  COMPREHENSIVE METABOLIC PANEL - Abnormal; Notable for the following:    Sodium 134 (*)    Chloride 99 (*)    All other components within normal limits  TROPONIN I    Imaging Review Ct Head Wo Contrast  12/19/2015  CLINICAL DATA:  Pt passed out and fell going into the [employer]. laceraton to left face. Hx of aortic stenosis, aortic insufficiency. EXAM: CT HEAD WITHOUT CONTRAST TECHNIQUE: Contiguous axial images were obtained from the base of the skull through the vertex without intravenous contrast. COMPARISON:  None. FINDINGS: There is no evidence of mass effect, midline shift, or extra-axial fluid collections. There is no evidence of a space-occupying lesion or intracranial hemorrhage. There is no evidence of a cortical-based area of acute infarction. There is generalized cerebral atrophy. There is periventricular white matter low attenuation likely secondary to microangiopathy. The ventricles and sulci are appropriate for the patient's age. The basal cisterns are patent. Visualized portions  of the orbits are unremarkable. The visualized portions of the paranasal sinuses and mastoid air cells are unremarkable. Cerebrovascular atherosclerotic calcifications are noted. The osseous structures are unremarkable. IMPRESSION: 1. No acute intracranial pathology. 2. Chronic microvascular disease and cerebral atrophy. Electronically Signed   By: Kathreen Devoid   On: 12/19/2015 13:24   I have personally reviewed and evaluated these images and lab results as part of my medical decision-making.   EKG Interpretation   Date/Time:  Thursday Dec 19 2015 11:53:08 EDT Ventricular Rate:  96 PR Interval:    QRS Duration: 105 QT Interval:  343 QTC Calculation: 433 R Axis:   -70 Text Interpretation:  Atrial fibrillation Multiple ventricular premature  complexes Left anterior fascicular block Low voltage, precordial leads  RSR' in V1 or V2, right VCD or RVH Baseline wander in lead(s) V5 Confirmed  by Manaal Mandala MD, Corene Cornea [phone]) on 12/19/2015 12:10:31 PM      MDM   Final diagnoses:  LOC (loss of consciousness)  Laceration  Bradycardia   82 yo F w/ fall and head injury, doesn't remember the events, may have  passed out or have anterograde amnesia. Wound repaired as above. Ct and labs ok. Initially with low HR (around 45, may have contributed to fall, recently increased metoprolol) however through rest of stay had HR's in 90's. Ambulated multiple times without difficulty.  Has been told she has AS in the past, but as of a month ago, had decided to not get surgery.  Will advise to consider decreasing Metoprolol if can't get in touch with her cardiologist, but either way needs follow up for further management.  Considered admission, however I did not see what would be gained from admission. She's had a recent echocardiogram, ct head, labs. HR was persistently stable for a couple hours. No neurologic changes to suggest stroke.  Will return here for repeat syncope, worsening symptoms.   New  Prescriptions: New Prescriptions   No medications on file     I have personally and contemperaneously reviewed labs and imaging and used in my decision making as above.   A medical screening exam was performed and I feel the patient has had an appropriate workup for their chief complaint at this time and likelihood of emergent condition existing is low and thus workup can continue on an outpatient basis.. Their vital signs are stable. They have been counseled on decision, discharge, follow up and which symptoms necessitate immediate return to the emergency department.  They verbally stated understanding and agreement with plan and discharged in stable condition.     Merrily Pew, MD 12/19/15 [phone]

## 2015-12-19 NOTE — ED Notes (Signed)
MD at bedside. 

## 2015-12-19 NOTE — ED Notes (Signed)
Patient ambulatory to restroom twice with no assistance. Denies any dizziness.

## 2015-12-20 ENCOUNTER — Encounter: Payer: Self-pay | Admitting: Cardiology

## 2015-12-20 ENCOUNTER — Ambulatory Visit (INDEPENDENT_AMBULATORY_CARE_PROVIDER_SITE_OTHER): Payer: Medicare Other | Admitting: Cardiology

## 2015-12-20 VITALS — BP 109/78 | HR 98 | Ht 65.0 in | Wt 153.0 lb

## 2015-12-20 DIAGNOSIS — I484 Atypical atrial flutter: Secondary | ICD-10-CM | POA: Diagnosis not present

## 2015-12-20 DIAGNOSIS — R55 Syncope and collapse: Secondary | ICD-10-CM | POA: Diagnosis not present

## 2015-12-20 DIAGNOSIS — I350 Nonrheumatic aortic (valve) stenosis: Secondary | ICD-10-CM

## 2015-12-20 MED ORDER — METOPROLOL SUCCINATE ER 25 MG PO TB24: 25.0000 mg | ORAL_TABLET | Freq: Two times a day (BID) | ORAL | Status: DC

## 2015-12-20 NOTE — Patient Instructions (Signed)
Your physician has recommended you make the following change in your medication:  Decrease metoprolol succinate (toprol xl) to 25 mg twice daily. You may break your 50 mg tablet in half twice daily until they are finished. Continue all other medications the same. You have been referred to Dr. Rayann Heman. Your physician recommends that you schedule a follow-up appointment in: 2 months. NO DRIVING.

## 2015-12-20 NOTE — Progress Notes (Signed)
Cardiology Office Note  Date: 12/20/2015   ID: KASYN APFELBAUM, DOB Sep 30, 1921, MRN [id number]  PCP: Gar Ponto, MD  Primary Cardiologist: Rozann Lesches, MD   Chief Complaint  Patient presents with  . Episode of syncope  . Aortic Stenosis  . Atrial Flutter    History of Present Illness: Christine Pugh is a 82 y.o. female that I saw just recently in the office on May 9. She comes back to the office at the request of her son. Interval records reviewed. She was seen in the ER on May 18 following an episode of syncope that occurred while walking from her car into the [facility]. She was reported to have a heart rate in the 40s documented when she originally got to the ER. She did sustain a left forehead laceration.  I was not able to review any of the telemetry strips from her ER visit. Heart rate was recorded at 115 by her vital signs in the ER. Her son present today states that he saw her heart rate in the 40s on the monitor, although it did subsequently increased.  She tells me that she does not recall details of her fall and syncopal event. She felt fine before this happened, did not endorse any chest pain, breathlessness, or palpitations. She has had unexplained syncope in the past and we evaluated this with a cardiac monitor in September of last year which did not show any obvious bradyarrhythmias or pauses. She also had no SVT.  I remain concerned that she has conduction system disease and the explanation for her syncopal events still could be a bradyarrhythmia or pauses. This would be common at her age and with valvular heart disease as well. I have discussed cutting her beta blocker dose back and have recommended that she be referred back to see Dr. Rayann Heman regarding an implantable loop recorder or further EP evaluation for pacemaker. I have explained that she should not drive at this time.  Past Medical History  Diagnosis Date  . Aortic insufficiency     Mild  . Aortic stenosis       Moderate to severe  . Atypical atrial flutter ([place])   . Benign essential HTN   . Hypothyroidism     Past Surgical History  Procedure Laterality Date  . Thyroidectomy    . Total abdominal hysterectomy    . Appendectomy    . Tonsillectomy    . Vitrectomy and cataract    . Dental implant    . Breast lumpectomy      Current Outpatient Prescriptions  Medication Sig Dispense Refill  . amiodarone (PACERONE) 200 MG tablet Take 1 tablet (200 mg total) by mouth daily. 90 tablet 3  . aspirin EC 81 MG tablet Take 81 mg by mouth daily.    . cyproheptadine (PERIACTIN) 4 MG tablet Take 4 mg by mouth 3 (three) times daily as needed for allergies. Reported on 08/27/2015    . levothyroxine (SYNTHROID, LEVOTHROID) 112 MCG tablet Take 112 mcg by mouth daily before breakfast.    . Lutein-Zeaxanthin 25-5 MG CAPS Take 1 capsule by mouth 2 (two) times daily.    . Multiple Vitamin (MULTIVITAMIN) tablet Take 1 tablet by mouth daily.      . Red Yeast Rice 600 MG TABS Take 1 tablet by mouth daily. Reported on 08/13/2015    . triamcinolone cream (KENALOG) 0.1 % Apply 1 application topically as needed (irritated skin).     Marland Kitchen triamterene-hydrochlorothiazide (DYAZIDE) 37.5-25 MG  per capsule Take 1 capsule by mouth daily.      . metoprolol succinate (TOPROL XL) 25 MG 24 hr tablet Take 1 tablet (25 mg total) by mouth 2 (two) times daily. 180 tablet 1   No current facility-administered medications for this visit.   Allergies:  Review of patient's allergies indicates no known allergies.   Social History: The patient  reports that she has never smoked. She has never used smokeless tobacco. She reports that she does not drink alcohol or use illicit drugs.   ROS:  Please see the history of present illness. Otherwise, complete review of systems is positive for chronic dyspnea on exertion without change.  All other systems are reviewed and negative.   Physical Exam: VS:  BP 109/78 mmHg  Pulse 98  Ht 5\' 5"  (1.651  m)  Wt 153 lb (69.4 kg)  BMI 25.46 kg/m2  SpO2 96%, BMI Body mass index is 25.46 kg/(m^2).  Wt Readings from Last 3 Encounters:  12/20/15 153 lb (69.4 kg)  12/19/15 150 lb (68.04 kg)  12/10/15 155 lb (70.308 kg)    Normally nourished appearing elderly woman, appears comfortable at rest. Bandaged above left eye with ecchymosis periorbital. HEENT: Conjunctiva and lids normal, oropharynx clear.  Neck: Supple, no carotid bruits, no thyromegaly.  Lungs: Clear to auscultation, nonlabored.  Cardiac: Irregular, 3/6 systolic murmur heard best at the base, no loud diastolic murmur, no S3.  Abdomen: Soft, nontender.  Extremities: No leg edema, distal pulses 2+.  Neuropsychiatric: Alert and oriented 3, affect appropriate.  ECG: I personally reviewed the tracing from 12/19/2015 which shows coarse atrial fibrillation versus atypical atrial flutter with left anterior fascicular block, low voltage, and intermittent PVCs.  Recent Labwork: 12/19/2015: ALT 18; AST 28; BUN 17; Creatinine, Ser 0.73; Hemoglobin 15.3*; Platelets 190; Potassium 3.9; Sodium 134*   Other Studies Reviewed Today:  Echocardiogram 11/06/2015: Study Conclusions  - Left ventricle: The cavity size was normal. Wall thickness was  increased in a pattern of mild LVH. Systolic function was normal.  The estimated ejection fraction was in the range of 60% to 65%.  Wall motion was normal; there were no regional wall motion  abnormalities. The study is not technically sufficient to allow  evaluation of LV diastolic function. - Aortic valve: While peak velocity and mean gradients do not  support this, there appears to be moderate to severe aortic  stenosis, which was noted on echocardiography on 05/17/14 as  well. Moderately to severely calcified annulus. Moderately  thickened, severely calcified leaflets. Cusp separation was  reduced. Peak velocity (S): 267 cm/s. Mean gradient (S): 17 mm  Hg. Valve area (VTI): 0.8  cm^2. Valve area (Vmax): 0.82 cm^2.  Valve area (Vmean): 0.81 cm^2. - Mitral valve: Moderately calcified annulus. Mildly calcified  leaflets . There was moderate eccentric regurgitation. - Left atrium: The atrium was moderately to severely dilated. - Right ventricle: Systolic function was mildly reduced. - Right atrium: The atrium was mildly dilated. - Atrial septum: There was increased thickness of the septum,  consistent with lipomatous hypertrophy. No defect or patent  foramen ovale was identified.  Assessment and Plan:  1. Episode of syncope as outlined above, still suspect an arrhythmogenic etiology, most likely bradycardic or pause. She had a similar event in September of last year, outpatient cardiac monitoring did not demonstrate any obvious dysrhythmias subsequently (although no event while wearing monitor). As outlined above, plan to cut back Toprol-XL dose and refer her back to see Dr. Rayann Heman for discussions  regarding implantable loop recorder or other EP assessment for pacemaker. I have explained that she should not drive at this particular time. I also asked her son to make sure that he is present for the visit with Dr. Rayann Heman.  2. Moderate to severe aortic stenosis, relatively stable by recent follow-up imaging. This could be contributing to her syncope, particularly if she were to be significant bradycardic with low blood pressure, but she does not have exertional dizziness or exertional syncope to expect that the valve disease in and of itself is causing the episodes.  3. Atypical atrial flutter. She continues on amiodarone, cutting the dose of Toprol-XL back. She has declined anticoagulation previously.  Current medicines were reviewed with the patient today.   Orders Placed This Encounter  Procedures  . Ambulatory referral to Cardiac Electrophysiology    Disposition: FU with me in 2 months.   Signed, Satira Sark, MD, [facility] 12/20/2015 12:17 PM    [facility] [place], [place], [place] [address] Phone: [phone]; Fax: [phone]

## 2015-12-24 DIAGNOSIS — E039 Hypothyroidism, unspecified: Secondary | ICD-10-CM | POA: Diagnosis not present

## 2015-12-24 DIAGNOSIS — E876 Hypokalemia: Secondary | ICD-10-CM | POA: Diagnosis not present

## 2015-12-24 DIAGNOSIS — I352 Nonrheumatic aortic (valve) stenosis with insufficiency: Secondary | ICD-10-CM | POA: Diagnosis not present

## 2015-12-24 DIAGNOSIS — E782 Mixed hyperlipidemia: Secondary | ICD-10-CM | POA: Diagnosis not present

## 2015-12-24 DIAGNOSIS — I10 Essential (primary) hypertension: Secondary | ICD-10-CM | POA: Diagnosis not present

## 2015-12-31 DIAGNOSIS — E039 Hypothyroidism, unspecified: Secondary | ICD-10-CM | POA: Diagnosis not present

## 2015-12-31 DIAGNOSIS — E782 Mixed hyperlipidemia: Secondary | ICD-10-CM | POA: Diagnosis not present

## 2015-12-31 DIAGNOSIS — I10 Essential (primary) hypertension: Secondary | ICD-10-CM | POA: Diagnosis not present

## 2015-12-31 DIAGNOSIS — I350 Nonrheumatic aortic (valve) stenosis: Secondary | ICD-10-CM | POA: Diagnosis not present

## 2015-12-31 DIAGNOSIS — I8391 Asymptomatic varicose veins of right lower extremity: Secondary | ICD-10-CM | POA: Diagnosis not present

## 2015-12-31 DIAGNOSIS — M545 Low back pain: Secondary | ICD-10-CM | POA: Diagnosis not present

## 2015-12-31 DIAGNOSIS — Z0001 Encounter for general adult medical examination with abnormal findings: Secondary | ICD-10-CM | POA: Diagnosis not present

## 2016-01-06 ENCOUNTER — Encounter: Payer: Self-pay | Admitting: Internal Medicine

## 2016-01-06 ENCOUNTER — Ambulatory Visit (INDEPENDENT_AMBULATORY_CARE_PROVIDER_SITE_OTHER): Payer: Medicare Other | Admitting: Internal Medicine

## 2016-01-06 VITALS — BP 108/78 | HR 95 | Ht 65.0 in | Wt 156.8 lb

## 2016-01-06 DIAGNOSIS — R55 Syncope and collapse: Secondary | ICD-10-CM | POA: Insufficient documentation

## 2016-01-06 DIAGNOSIS — I459 Conduction disorder, unspecified: Secondary | ICD-10-CM | POA: Diagnosis not present

## 2016-01-06 DIAGNOSIS — I10 Essential (primary) hypertension: Secondary | ICD-10-CM

## 2016-01-06 DIAGNOSIS — I484 Atypical atrial flutter: Secondary | ICD-10-CM | POA: Diagnosis not present

## 2016-01-06 NOTE — Patient Instructions (Signed)
Medication Instructions:  Your physician recommends that you continue on your current medications as directed. Please refer to the Current Medication list given to you today.   Labwork: None ordered   Testing/Procedures: LINQ implant on Fri 01/10/16 at 7:30 am Please check in at [place] [facility] at 6:30am  Follow-Up: Your physician recommends that you schedule a follow-up appointment in: 10-14 days from 01/10/16 in device clinic for wound check    Any Other Special Instructions Will Be Listed Below (If Applicable).     If you need a refill on your cardiac medications before your next appointment, please call your pharmacy.

## 2016-01-06 NOTE — Progress Notes (Signed)
Electrophysiology Office Note   Date:  01/06/2016   ID:  Christine Pugh, DOB 1921-12-31, MRN [id number]  PCP:  Gar Ponto, MD  Cardiologist:  Dr Domenic Polite,  Dr Burt Knack also sees for aortic stenosis Primary Electrophysiologist: [name], MD    Chief Complaint  Patient presents with  . Loss of Consciousness     History of Present Illness: Christine Pugh is a 82 y.o. female who presents today for electrophysiology evaluation.   She has a h/o aortic stenosis and is followed by Drs Domenic Polite and Burt Knack.  She has longstanding atypical atrial flutter for which she is asymptomatic.  She has declined anticoagulation.   She returns after a syncopal event.  Her initial syncope occurred 9/16.  She wore an event monitor which did not reveal any arrhythmias.  She presented to see Dr Domenic Polite 08/2015.  She was noted to have an atrial tachycardia vs atypical atrial flutter at 130 bpm at that time for which she was unaware.  She was placed on amiodarone and toprol for rate control.  She has had several ekgs but remains in an atypical atrial flutter.   She remains active for her age.  She continues to swim at that [facility]. [facility] and had LOC.  She hit her head.  The etiology for the event is unknown.  She presented to the hospital where the son overheard a nurse say that her heart rate was 40s.  She was asymptomatic at that time and all documented heart rates were 110s.  She was evaluated by Dr Domenic Polite and her toprol was decreased.  She has had no further symptomatic episodes.  She is referred for further evaluation.  Today, she denies symptoms of palpitations, chest pain, shortness of breath, orthopnea, PND, lower extremity edema, claudication, dizziness,  bleeding, or neurologic sequela. The patient is tolerating medications without difficulties and is otherwise without complaint today.    Past Medical History  Diagnosis Date  . Aortic  insufficiency     Mild  . Aortic stenosis     Moderate to severe  . Atypical atrial flutter ([place])   . Benign essential HTN   . Hypothyroidism    Past Surgical History  Procedure Laterality Date  . Thyroidectomy    . Total abdominal hysterectomy    . Appendectomy    . Tonsillectomy    . Vitrectomy and cataract    . Dental implant    . Breast lumpectomy       Current Outpatient Prescriptions  Medication Sig Dispense Refill  . amiodarone (PACERONE) 200 MG tablet Take 1 tablet (200 mg total) by mouth daily. 90 tablet 3  . aspirin EC 81 MG tablet Take 81 mg by mouth daily.    . cyproheptadine (PERIACTIN) 4 MG tablet Take 4 mg by mouth 3 (three) times daily as needed for allergies. Reported on 08/27/2015    . levothyroxine (SYNTHROID, LEVOTHROID) 137 MCG tablet Take 1 tablet by mouth daily before breakfast.    . Lutein-Zeaxanthin 25-5 MG CAPS Take 1 capsule by mouth 2 (two) times daily.    . metoprolol succinate (TOPROL XL) 25 MG 24 hr tablet Take 1 tablet (25 mg total) by mouth 2 (two) times daily. 180 tablet 1  . Multiple Vitamin (MULTIVITAMIN) tablet Take 1 tablet by mouth daily.      . Red Yeast Rice 600 MG TABS Take 1 tablet by mouth daily. Reported on 08/13/2015    .  triamcinolone cream (KENALOG) 0.1 % Apply 1 application topically daily as needed (irritated skin).     Marland Kitchen triamterene-hydrochlorothiazide (DYAZIDE) 37.5-25 MG per capsule Take 1 capsule by mouth daily.       No current facility-administered medications for this visit.    Allergies:   Review of patient's allergies indicates no known allergies.   Social History:  The patient  reports that she has never smoked. She has never used smokeless tobacco. She reports that she does not drink alcohol or use illicit drugs.   Family History:  The patient's FH includes CAD   ROS:  Please see the history of present illness.   All other systems are reviewed and negative.    PHYSICAL EXAM: VS:  BP 108/78 mmHg  Pulse 95  Ht  5\' 5"  (1.651 m)  Wt 156 lb 12.8 oz (71.124 kg)  BMI 26.09 kg/m2 , BMI Body mass index is 26.09 kg/(m^2). GEN: Well nourished, well developed, in no acute distress HEENT: normal Neck: no JVD, carotid bruits, or masses Cardiac: iRRR; 2/6 SEM LUSB which is late peaking,  trace edema  Respiratory:  clear to auscultation bilaterally, normal work of breathing GI: soft, nontender, nondistended, + BS MS: no deformity or atrophy Skin: warm and dry  Neuro:  Strength and sensation are intact Psych: euthymic mood, full affect  EKG:  EKG is ordered today. The ekg ordered today shows afib, V rate 95 bpm    Wt Readings from Last 3 Encounters:  01/06/16 156 lb 12.8 oz (71.124 kg)  12/20/15 153 lb (69.4 kg)  12/19/15 150 lb (68.04 kg)      Other studies Reviewed: Additional studies/ records that were reviewed today include: Dr Chuck Hint notes, prior echos, prior monitor, prior ekgs, Dr York Cerise notes   ASSESSMENT AND PLAN:  1.  Syncope Unclear etiology The patient and her son have a preconceive idea that she had a bradycardic event that warrants pacing. I  Had a long discussion with them today that I do not see any bradycardic episodes documented and that appropriate initial therapy would be to reduce beta blockers (as has been done).  No convincing evident to proceed with pacing. Would recommend implantable loop recorder placement to evaluate for arrhythmia as the cause for her syncope.  Risks of procedure discussed with patient who wishes to proceed. I would also consider severe AS as a highly likely cause for her syncope. No driving x 6 months given recent syncope  2.  Atypical atrial flutter/ afib No real symptoms at this time Wean amio/ toprol as heart rate allows going forward ILR will help with this. I have again very clearly advised anticoagulation for stroke prevention.  She is clear in her decision to decline. She will therefore continue with rate control and follow-up with Dr  Domenic Polite as scheduled  3. HTN Stable No change required today  Proceed with ILR placement.  Implant offered for today however she wishes to wait until Friday. Will follow remotely and see as needed after implant   Signed, [name], MD  01/06/2016 3:45 PM     [place] [address] [phone] (office) [phone] (fax)

## 2016-01-10 ENCOUNTER — Encounter (HOSPITAL_COMMUNITY)
Admission: RE | Disposition: A | Discharge status: Home / Self Care | Payer: Self-pay | Source: Ambulatory Visit | Attending: Internal Medicine

## 2016-01-10 ENCOUNTER — Ambulatory Visit (HOSPITAL_COMMUNITY)
Admission: RE | Admit: 2016-01-10 | Discharge: 2016-01-10 | Disposition: A | Discharge status: Home / Self Care | Payer: Medicare Other | Source: Ambulatory Visit | Attending: Internal Medicine | Admitting: Internal Medicine

## 2016-01-10 DIAGNOSIS — Z8249 Family history of ischemic heart disease and other diseases of the circulatory system: Secondary | ICD-10-CM | POA: Insufficient documentation

## 2016-01-10 DIAGNOSIS — E039 Hypothyroidism, unspecified: Secondary | ICD-10-CM | POA: Insufficient documentation

## 2016-01-10 DIAGNOSIS — R55 Syncope and collapse: Secondary | ICD-10-CM | POA: Insufficient documentation

## 2016-01-10 DIAGNOSIS — I10 Essential (primary) hypertension: Secondary | ICD-10-CM | POA: Insufficient documentation

## 2016-01-10 DIAGNOSIS — I484 Atypical atrial flutter: Secondary | ICD-10-CM | POA: Insufficient documentation

## 2016-01-10 DIAGNOSIS — I350 Nonrheumatic aortic (valve) stenosis: Secondary | ICD-10-CM | POA: Insufficient documentation

## 2016-01-10 DIAGNOSIS — Z7982 Long term (current) use of aspirin: Secondary | ICD-10-CM | POA: Diagnosis not present

## 2016-01-10 HISTORY — PX: EP IMPLANTABLE DEVICE: SHX172B

## 2016-01-10 SURGERY — LOOP RECORDER INSERTION

## 2016-01-10 MED ORDER — LIDOCAINE-EPINEPHRINE 1 %-1:100000 IJ SOLN
INTRAMUSCULAR | Status: AC
  Filled 2016-01-10: qty 1

## 2016-01-10 MED ORDER — LIDOCAINE HCL (PF) 1 % IJ SOLN
INTRAMUSCULAR | Status: DC | PRN
  Administered 2016-01-10: 5 mL via SUBCUTANEOUS

## 2016-01-10 SURGICAL SUPPLY — 2 items
LOOP REVEAL LINQSYS (Prosthesis & Implant Heart) ×2 IMPLANT
PACK LOOP INSERTION (CUSTOM PROCEDURE TRAY) ×3 IMPLANT

## 2016-01-10 NOTE — H&P (View-Only) (Signed)
Electrophysiology Office Note   Date:  01/06/2016   ID:  Christine Pugh, DOB 1921/09/17, MRN [id number]  PCP:  Gar Ponto, MD  Cardiologist:  Dr Domenic Polite,  Dr Burt Knack also sees for aortic stenosis Primary Electrophysiologist: [name], MD    Chief Complaint  Patient presents with  . Loss of Consciousness     History of Present Illness: Christine Pugh is a 82 y.o. female who presents today for electrophysiology evaluation.   She has a h/o aortic stenosis and is followed by Drs Domenic Polite and Burt Knack.  She has longstanding atypical atrial flutter for which she is asymptomatic.  She has declined anticoagulation.   She returns after a syncopal event.  Her initial syncope occurred 9/16.  She wore an event monitor which did not reveal any arrhythmias.  She presented to see Dr Domenic Polite 08/2015.  She was noted to have an atrial tachycardia vs atypical atrial flutter at 130 bpm at that time for which she was unaware.  She was placed on amiodarone and toprol for rate control.  She has had several ekgs but remains in an atypical atrial flutter.   She remains active for her age.  She continues to swim [facility]. [facility] and had LOC.  She hit her head.  The etiology for the event is unknown.  She presented to the hospital where the son overheard a nurse say that her heart rate was 40s.  She was asymptomatic at that time and all documented heart rates were 110s.  She was evaluated by Dr Domenic Polite and her toprol was decreased.  She has had no further symptomatic episodes.  She is referred for further evaluation.  Today, she denies symptoms of palpitations, chest pain, shortness of breath, orthopnea, PND, lower extremity edema, claudication, dizziness,  bleeding, or neurologic sequela. The patient is tolerating medications without difficulties and is otherwise without complaint today.    Past Medical History  Diagnosis Date  . Aortic  insufficiency     Mild  . Aortic stenosis     Moderate to severe  . Atypical atrial flutter ([place])   . Benign essential HTN   . Hypothyroidism    Past Surgical History  Procedure Laterality Date  . Thyroidectomy    . Total abdominal hysterectomy    . Appendectomy    . Tonsillectomy    . Vitrectomy and cataract    . Dental implant    . Breast lumpectomy       Current Outpatient Prescriptions  Medication Sig Dispense Refill  . amiodarone (PACERONE) 200 MG tablet Take 1 tablet (200 mg total) by mouth daily. 90 tablet 3  . aspirin EC 81 MG tablet Take 81 mg by mouth daily.    . cyproheptadine (PERIACTIN) 4 MG tablet Take 4 mg by mouth 3 (three) times daily as needed for allergies. Reported on 08/27/2015    . levothyroxine (SYNTHROID, LEVOTHROID) 137 MCG tablet Take 1 tablet by mouth daily before breakfast.    . Lutein-Zeaxanthin 25-5 MG CAPS Take 1 capsule by mouth 2 (two) times daily.    . metoprolol succinate (TOPROL XL) 25 MG 24 hr tablet Take 1 tablet (25 mg total) by mouth 2 (two) times daily. 180 tablet 1  . Multiple Vitamin (MULTIVITAMIN) tablet Take 1 tablet by mouth daily.      . Red Yeast Rice 600 MG TABS Take 1 tablet by mouth daily. Reported on 08/13/2015    .  triamcinolone cream (KENALOG) 0.1 % Apply 1 application topically daily as needed (irritated skin).     Marland Kitchen triamterene-hydrochlorothiazide (DYAZIDE) 37.5-25 MG per capsule Take 1 capsule by mouth daily.       No current facility-administered medications for this visit.    Allergies:   Review of patient's allergies indicates no known allergies.   Social History:  The patient  reports that she has never smoked. She has never used smokeless tobacco. She reports that she does not drink alcohol or use illicit drugs.   Family History:  The patient's FH includes CAD   ROS:  Please see the history of present illness.   All other systems are reviewed and negative.    PHYSICAL EXAM: VS:  BP 108/78 mmHg  Pulse 95  Ht  5\' 5"  (1.651 m)  Wt 156 lb 12.8 oz (71.124 kg)  BMI 26.09 kg/m2 , BMI Body mass index is 26.09 kg/(m^2). GEN: Well nourished, well developed, in no acute distress HEENT: normal Neck: no JVD, carotid bruits, or masses Cardiac: iRRR; 2/6 SEM LUSB which is late peaking,  trace edema  Respiratory:  clear to auscultation bilaterally, normal work of breathing GI: soft, nontender, nondistended, + BS MS: no deformity or atrophy Skin: warm and dry  Neuro:  Strength and sensation are intact Psych: euthymic mood, full affect  EKG:  EKG is ordered today. The ekg ordered today shows afib, V rate 95 bpm    Wt Readings from Last 3 Encounters:  01/06/16 156 lb 12.8 oz (71.124 kg)  12/20/15 153 lb (69.4 kg)  12/19/15 150 lb (68.04 kg)      Other studies Reviewed: Additional studies/ records that were reviewed today include: Dr Chuck Hint notes, prior echos, prior monitor, prior ekgs, Dr York Cerise notes   ASSESSMENT AND PLAN:  1.  Syncope Unclear etiology The patient and her son have a preconceive idea that she had a bradycardic event that warrants pacing. I  Had a long discussion with them today that I do not see any bradycardic episodes documented and that appropriate initial therapy would be to reduce beta blockers (as has been done).  No convincing evident to proceed with pacing. Would recommend implantable loop recorder placement to evaluate for arrhythmia as the cause for her syncope.  Risks of procedure discussed with patient who wishes to proceed. I would also consider severe AS as a highly likely cause for her syncope. No driving x 6 months given recent syncope  2.  Atypical atrial flutter/ afib No real symptoms at this time Wean amio/ toprol as heart rate allows going forward ILR will help with this. I have again very clearly advised anticoagulation for stroke prevention.  She is clear in her decision to decline. She will therefore continue with rate control and follow-up with Dr  Domenic Polite as scheduled  3. HTN Stable No change required today  Proceed with ILR placement.  Implant offered for today however she wishes to wait until Friday. Will follow remotely and see as needed after implant   Signed, [name], MD  01/06/2016 3:45 PM     [place] [address] [phone] (office) [phone] (fax)

## 2016-01-10 NOTE — Interval H&P Note (Signed)
History and Physical Interval Note:  01/10/2016 7:33 AM  Christine Pugh  has presented today for surgery, with the diagnosis of snycope  The various methods of treatment have been discussed with the patient and family. After consideration of risks, benefits and other options for treatment, the patient has consented to  Procedure(s): Loop Recorder Insertion (N/A) as a surgical intervention .  The patient's history has been reviewed, patient examined, no change in status, stable for surgery.  I have reviewed the patient's chart and labs.  Questions were answered to the patient's satisfaction.     [name]

## 2016-01-13 ENCOUNTER — Encounter (HOSPITAL_COMMUNITY): Payer: Self-pay | Admitting: Internal Medicine

## 2016-01-13 ENCOUNTER — Telehealth: Payer: Self-pay | Admitting: Internal Medicine

## 2016-01-13 NOTE — Telephone Encounter (Signed)
Spoke with Ms. Schrag and I advised her that she could take off the tegaderm and gauze dressing since it is stained with old blood, and leave the steri-strips in place. She verbalizes understanding. I advised her to call back to the device clinic if she has other concerns, including redness, swelling, drainage or fever.

## 2016-01-13 NOTE — Telephone Encounter (Signed)
New message      Pt recently had a loop recorder put in.  Talk to the nurse regarding wound care.

## 2016-01-22 ENCOUNTER — Encounter: Payer: Self-pay | Admitting: Internal Medicine

## 2016-01-22 ENCOUNTER — Ambulatory Visit (INDEPENDENT_AMBULATORY_CARE_PROVIDER_SITE_OTHER): Payer: Medicare Other | Admitting: *Deleted

## 2016-01-22 DIAGNOSIS — I484 Atypical atrial flutter: Secondary | ICD-10-CM

## 2016-01-22 DIAGNOSIS — R55 Syncope and collapse: Secondary | ICD-10-CM

## 2016-01-22 LAB — CUP PACEART INCLINIC DEVICE CHECK: MDC IDC SESS DTM: 20170621093808

## 2016-01-22 NOTE — Progress Notes (Signed)
Wound check in clinic s/p ILR implant. Wound well healed without redness or edema. Incision edges approximated. Normal ILR function. Battery status: GOOD. R-waves 0.52 mV. 0 symptom episodes, 0 tachy episodes, 0 pause episodes, 0 brady episodes. 21.5% AF burden--presently in AF + Amio/ASA 81 (implanted for syncope). Monthly summary reports and ROV with JA/E in 3 months.

## 2016-02-10 ENCOUNTER — Ambulatory Visit (INDEPENDENT_AMBULATORY_CARE_PROVIDER_SITE_OTHER): Payer: Medicare Other | Admitting: *Deleted

## 2016-02-10 DIAGNOSIS — R55 Syncope and collapse: Secondary | ICD-10-CM | POA: Diagnosis not present

## 2016-02-10 DIAGNOSIS — H353131 Nonexudative age-related macular degeneration, bilateral, early dry stage: Secondary | ICD-10-CM | POA: Diagnosis not present

## 2016-02-10 DIAGNOSIS — H43811 Vitreous degeneration, right eye: Secondary | ICD-10-CM | POA: Diagnosis not present

## 2016-02-10 DIAGNOSIS — H35361 Drusen (degenerative) of macula, right eye: Secondary | ICD-10-CM | POA: Diagnosis not present

## 2016-02-10 DIAGNOSIS — H35362 Drusen (degenerative) of macula, left eye: Secondary | ICD-10-CM | POA: Diagnosis not present

## 2016-02-10 NOTE — Progress Notes (Signed)
Carelink Summary Report / Loop Recorder 

## 2016-02-11 ENCOUNTER — Telehealth: Payer: Self-pay | Admitting: Cardiology

## 2016-02-11 DIAGNOSIS — E039 Hypothyroidism, unspecified: Secondary | ICD-10-CM | POA: Diagnosis not present

## 2016-02-11 NOTE — Telephone Encounter (Signed)
-----   Message from [place] sent at 02/11/2016 10:06 AM EDT ----- Pt says she received mychart message about remote device check, and would like someone to call her. She says she wasn't home yesterday. Thanks  [employer]

## 2016-02-11 NOTE — Telephone Encounter (Signed)
LMOVM for pt to return call 

## 2016-02-12 NOTE — Telephone Encounter (Signed)
Spoke w/ pt and informed her that she didn't need to do anything for the remote appointments. Informed her that as long as her home monitor is plugged up by her bed her monitor will send the information automatically. Pt verbalized understanding is aware that she will only receive phone calls if her monitor isn't updating daily, or if she ever has any episodes.

## 2016-03-04 LAB — CUP PACEART REMOTE DEVICE CHECK: MDC IDC SESS DTM: 20170709120608

## 2016-03-04 NOTE — Progress Notes (Signed)
Cardiology Office Note  Date: 03/16/2016   ID: Christine Pugh, DOB May 29, 1922, MRN [id number]  PCP: Gar Ponto, MD  Primary Cardiologist: Rozann Lesches, MD   Chief Complaint  Patient presents with  . Aortic Stenosis  . History of syncope    History of Present Illness: Christine Pugh is a 82 y.o. female last seen in May. I referred her to see Dr. Rayann Heman for further evaluation of unexplained syncope with suspicion that she may have sick sinus syndrome. Implantable loop recorder was placed. She has had no events with device in place so far.  She has known atypical atrial flutter/fibrillation, declines anticoagulation, and has been on combination of amiodarone and Toprol-XL. Loop recorder interrogation shows approximately 22% atrial arrhythmias, importantly no bradycardia or pauses. I reviewed her ECG today which shows atypical atrial flutter with variable conduction and aberrantly conducted complex, left anterior fascicular block, decreased R wave progression, low voltage.  Her most recent echocardiogram from April is outlined below. She is due for a follow-up study in November at which point we will see her back clinically.  She is not driving at this time, anticipates 6 months since her last episode of syncope in May.  She is swimming at the [facility] two days a week.  Past Medical History:  Diagnosis Date  . Aortic insufficiency    Mild  . Aortic stenosis    Moderate to severe  . Atypical atrial flutter ([place])   . Benign essential HTN   . Hypothyroidism     Past Surgical History:  Procedure Laterality Date  . APPENDECTOMY    . BREAST LUMPECTOMY    . dental implant    . EP IMPLANTABLE DEVICE N/A 01/10/2016   Procedure: Loop Recorder Insertion;  Surgeon: [name], MD;  Location: [facility];  Service: Cardiovascular;  Laterality: N/A;  . THYROIDECTOMY    . TONSILLECTOMY    . TOTAL ABDOMINAL HYSTERECTOMY    . VITRECTOMY AND CATARACT      Current Outpatient  Prescriptions  Medication Sig Dispense Refill  . amiodarone (PACERONE) 200 MG tablet Take 1 tablet (200 mg total) by mouth daily. 90 tablet 3  . aspirin EC 81 MG tablet Take 81 mg by mouth daily.    . cyproheptadine (PERIACTIN) 4 MG tablet Take 4 mg by mouth 3 (three) times daily as needed for allergies. Reported on 08/27/2015    . levothyroxine (SYNTHROID, LEVOTHROID) 137 MCG tablet Take 137 mcg by mouth daily before breakfast.     . Lutein-Zeaxanthin 25-5 MG CAPS Take 1 capsule by mouth 2 (two) times daily.    . metoprolol succinate (TOPROL XL) 25 MG 24 hr tablet Take 1 tablet (25 mg total) by mouth 2 (two) times daily. 180 tablet 1  . Multiple Vitamin (MULTIVITAMIN) tablet Take 1 tablet by mouth daily.      . Red Yeast Rice 600 MG TABS Take 600 mg by mouth daily.     Marland Kitchen triamcinolone cream (KENALOG) 0.1 % Apply 1 application topically daily as needed (irritated skin).     Marland Kitchen triamterene-hydrochlorothiazide (DYAZIDE) 37.5-25 MG per capsule Take 1 capsule by mouth daily.       No current facility-administered medications for this visit.    Allergies:  Review of patient's allergies indicates no known allergies.   Social History: The patient  reports that she has never smoked. She has never used smokeless tobacco. She reports that she does not drink alcohol or use drugs.   ROS:  Please see the history of present illness. Otherwise, complete review of systems is positive for arthritic knee stiffness and pain.  All other systems are reviewed and negative.   Physical Exam: VS:  BP 100/82   Pulse 90   Ht 5\' 4"  (1.626 m)   Wt 152 lb (68.9 kg)   SpO2 95%   BMI 26.09 kg/m , BMI Body mass index is 26.09 kg/m.  Wt Readings from Last 3 Encounters:  03/16/16 152 lb (68.9 kg)  01/10/16 151 lb (68.5 kg)  01/06/16 156 lb 12.8 oz (71.1 kg)    Normally nourished appearing elderly woman, appears comfortable at rest. Bandaged above left eye with ecchymosis periorbital. HEENT: Conjunctiva and lids  normal, oropharynx clear.  Neck: Supple, no carotid bruits, no thyromegaly.  Lungs: Clear to auscultation, nonlabored.  Cardiac: Irregular, 3/6 systolic murmur heard best at the base, no loud diastolic murmur, no S3.  Abdomen: Soft, nontender.  Extremities: No leg edema, distal pulses 2+.  Neuropsychiatric: Alert and oriented 3, affect appropriate.  ECG: I personally reviewed the tracing from 01/06/2016 which showed atypical atrial flutter with variable block and controlled heart rate, low voltage and nonspecific ST changes.  Recent Labwork: 12/19/2015: ALT 18; AST 28; BUN 17; Creatinine, Ser 0.73; Hemoglobin 15.3; Platelets 190; Potassium 3.9; Sodium 134   Other Studies Reviewed Today:  Echocardiogram 11/06/2015: Study Conclusions  - Left ventricle: The cavity size was normal. Wall thickness was  increased in a pattern of mild LVH. Systolic function was normal.  The estimated ejection fraction was in the range of 60% to 65%.  Wall motion was normal; there were no regional wall motion  abnormalities. The study is not technically sufficient to allow  evaluation of LV diastolic function. - Aortic valve: While peak velocity and mean gradients do not  support this, there appears to be moderate to severe aortic  stenosis, which was noted on echocardiography on 05/17/14 as  well. Moderately to severely calcified annulus. Moderately  thickened, severely calcified leaflets. Cusp separation was  reduced. Peak velocity (S): 267 cm/s. Mean gradient (S): 17 mm  Hg. Valve area (VTI): 0.8 cm^2. Valve area (Vmax): 0.82 cm^2.  Valve area (Vmean): 0.81 cm^2. - Mitral valve: Moderately calcified annulus. Mildly calcified  leaflets . There was moderate eccentric regurgitation. - Left atrium: The atrium was moderately to severely dilated. - Right ventricle: Systolic function was mildly reduced. - Right atrium: The atrium was mildly dilated. - Atrial septum: There was increased  thickness of the septum,  consistent with lipomatous hypertrophy. No defect or patent  foramen ovale was identified.  Assessment and Plan:  1. Moderate to severe aortic stenosis, symptomatically stable with plan for follow-up echocardiogram in November and clinical reassessment. She has already been seen by Dr. Burt Knack for possibility of TAVR if the need arises.  2. Episodes of unexplained syncope, arrhythmogenic etiology suspected but not yet proven. She was seen by Dr. Rayann Heman and now has a loop recorder in place for further evaluation. So far no recurrent events. No driving for 6 months after her last event in May.  3. Atypical atrial flutter, continue current regimen including amiodarone and Toprol-XL. She has declined anticoagulation.  4. History of hypertension. Blood pressure low normal today, patient asymptomatic. Could consider dropping Dyazide if she reported any orthostatic dizziness, however this has not been the case. States that when Dyazide was dropped previously by Dr. Quillian Quince, she developed ankle edema.  Current medicines were reviewed with the patient today.   Orders  Placed This Encounter  Procedures  . EKG 12-Lead    Disposition: Follow-up with me after echocardiogram in November.  Signed, Satira Sark, MD, [facility] 03/16/2016 10:35 AM    [facility] at [address], [place], [place] [address] Phone: [phone]; Fax: [phone]

## 2016-03-04 NOTE — Progress Notes (Signed)
Carelink summary report received. Battery status OK. Normal device function. No new symptom episodes, tachy episodes, brady, or pause episodes. 19 AF 21.8% +Amio +ASA 81mg , has declined anticoagulation in the past per in office note. Monthly summary reports and ROV/PRN

## 2016-03-05 ENCOUNTER — Encounter: Payer: Self-pay | Admitting: Internal Medicine

## 2016-03-10 ENCOUNTER — Ambulatory Visit (INDEPENDENT_AMBULATORY_CARE_PROVIDER_SITE_OTHER): Payer: Medicare Other | Admitting: *Deleted

## 2016-03-10 DIAGNOSIS — R55 Syncope and collapse: Secondary | ICD-10-CM | POA: Diagnosis not present

## 2016-03-10 NOTE — Progress Notes (Signed)
Carelink Summary Report / Loop Recorder 

## 2016-03-16 ENCOUNTER — Encounter: Payer: Self-pay | Admitting: Cardiology

## 2016-03-16 ENCOUNTER — Ambulatory Visit (INDEPENDENT_AMBULATORY_CARE_PROVIDER_SITE_OTHER): Payer: Medicare Other | Admitting: Cardiology

## 2016-03-16 VITALS — BP 100/82 | HR 90 | Ht 64.0 in | Wt 152.0 lb

## 2016-03-16 DIAGNOSIS — I10 Essential (primary) hypertension: Secondary | ICD-10-CM | POA: Diagnosis not present

## 2016-03-16 DIAGNOSIS — I350 Nonrheumatic aortic (valve) stenosis: Secondary | ICD-10-CM | POA: Diagnosis not present

## 2016-03-16 DIAGNOSIS — R55 Syncope and collapse: Secondary | ICD-10-CM

## 2016-03-16 DIAGNOSIS — I484 Atypical atrial flutter: Secondary | ICD-10-CM | POA: Diagnosis not present

## 2016-03-16 NOTE — Patient Instructions (Signed)
Medication Instructions:   Your physician recommends that you continue on your current medications as directed. Please refer to the Current Medication list given to you today.  Labwork: NONE  Testing/Procedures: NONE  Follow-Up:  Your physician recommends that you schedule a follow-up appointment in: in November 2017 after your already scheduled echo.  Any Other Special Instructions Will Be Listed Below (If Applicable).  If you need a refill on your cardiac medications before your next appointment, please call your pharmacy.

## 2016-03-17 LAB — CUP PACEART REMOTE DEVICE CHECK: MDC IDC SESS DTM: 20170808120621

## 2016-03-30 ENCOUNTER — Other Ambulatory Visit: Payer: Self-pay

## 2016-04-09 ENCOUNTER — Ambulatory Visit (INDEPENDENT_AMBULATORY_CARE_PROVIDER_SITE_OTHER): Payer: Medicare Other | Admitting: *Deleted

## 2016-04-09 DIAGNOSIS — R55 Syncope and collapse: Secondary | ICD-10-CM | POA: Diagnosis not present

## 2016-04-09 NOTE — Progress Notes (Signed)
Carelink Summary Report / Loop Recorder 

## 2016-04-23 DIAGNOSIS — Z23 Encounter for immunization: Secondary | ICD-10-CM | POA: Diagnosis not present

## 2016-05-01 ENCOUNTER — Encounter: Payer: Self-pay | Admitting: Internal Medicine

## 2016-05-01 ENCOUNTER — Ambulatory Visit (INDEPENDENT_AMBULATORY_CARE_PROVIDER_SITE_OTHER): Payer: Medicare Other | Admitting: Internal Medicine

## 2016-05-01 VITALS — BP 110/73 | HR 87 | Ht 64.0 in | Wt 154.0 lb

## 2016-05-01 DIAGNOSIS — I471 Supraventricular tachycardia: Secondary | ICD-10-CM | POA: Diagnosis not present

## 2016-05-01 DIAGNOSIS — I10 Essential (primary) hypertension: Secondary | ICD-10-CM

## 2016-05-01 DIAGNOSIS — I484 Atypical atrial flutter: Secondary | ICD-10-CM

## 2016-05-01 NOTE — Progress Notes (Signed)
PCP: Gar Ponto, MD Primary Cardiologist:  Dr Jolinda Croak is a 82 y.o. female who presents today for routine electrophysiology followup.  Since last being seen in our clinic, the patient reports doing very well.  She has had no further syncope.  Remains active but has not yet returned to swimming.  Today, she denies symptoms of palpitations, chest pain, shortness of breath,  lower extremity edema, dizziness, presyncope, or syncope.  The patient is otherwise without complaint today.   Past Medical History:  Diagnosis Date  . Aortic insufficiency    Mild  . Aortic stenosis    Moderate to severe  . Atypical atrial flutter ([place])   . Benign essential HTN   . Hypothyroidism    Past Surgical History:  Procedure Laterality Date  . APPENDECTOMY    . BREAST LUMPECTOMY    . dental implant    . EP IMPLANTABLE DEVICE N/A 01/10/2016   Procedure: Loop Recorder Insertion;  Surgeon: [name], MD;  Location: [facility];  Service: Cardiovascular;  Laterality: N/A;  . THYROIDECTOMY    . TONSILLECTOMY    . TOTAL ABDOMINAL HYSTERECTOMY    . VITRECTOMY AND CATARACT      ROS- all systems are reviewed and negatives except as per HPI above  Current Outpatient Prescriptions  Medication Sig Dispense Refill  . amiodarone (PACERONE) 200 MG tablet Take 1 tablet (200 mg total) by mouth daily. 90 tablet 3  . aspirin EC 81 MG tablet Take 81 mg by mouth daily.    . cyproheptadine (PERIACTIN) 4 MG tablet Take 4 mg by mouth 3 (three) times daily as needed for allergies. Reported on 08/27/2015    . levothyroxine (SYNTHROID, LEVOTHROID) 137 MCG tablet Take 137 mcg by mouth daily before breakfast.     . Lutein-Zeaxanthin 25-5 MG CAPS Take 1 capsule by mouth 2 (two) times daily.    . metoprolol succinate (TOPROL XL) 25 MG 24 hr tablet Take 1 tablet (25 mg total) by mouth 2 (two) times daily. 180 tablet 1  . Multiple Vitamin (MULTIVITAMIN) tablet Take 1 tablet by mouth daily.      . Red Yeast  Rice 600 MG TABS Take 600 mg by mouth daily.     Marland Kitchen triamcinolone cream (KENALOG) 0.1 % Apply 1 application topically daily as needed (irritated skin).     Marland Kitchen triamterene-hydrochlorothiazide (DYAZIDE) 37.5-25 MG per capsule Take 1 capsule by mouth daily.       No current facility-administered medications for this visit.     Physical Exam: Vitals:   05/01/16 0838  BP: 110/73  Pulse: 87  SpO2: 97%  Weight: 154 lb (69.9 kg)  Height: 5\' 4"  (1.626 m)    GEN- The patient is well appearing, alert and oriented x 3 today.   Head- normocephalic, atraumatic Eyes-  Sclera clear, conjunctiva pink Ears- hearing intact Oropharynx- clear Lungs- Clear to ausculation bilaterally, normal work of breathing Heart- Regular rate and rhythm, no murmurs, rubs or gallops, PMI not laterally displaced GI- soft, NT, ND, + BS Extremities- no clubbing, cyanosis, or edema  ILR interrogation is reviewed today and does not reveal any brady events Histograms look ok Afib/ atrial flutter 22%  Assessment and Plan:  1. Syncope No further episodes Continue ILR monitoring I worry about her on dyazide long term.  Minimize use if able. She states that when it was stopped previously that she had worsening edema  2. Afib/ atypical flutter We discussed risks of amiodarone.  I would like to try to wean this.  She is reluctant to make changes Will check lfts, tfts today Consider reducing amiodarone to 100mg  daily upon return She is very clear in her decision to decline anticoagulation  3. htn Stable No change required today bmet today  Carelink Resume driving in November if no further syncope Return to see Dr Domenic Polite as scheduled I will see in 6 months  [name] MD, [facility] 05/01/2016 10:15 AM

## 2016-05-01 NOTE — Patient Instructions (Signed)
Medication Instructions:  Continue all current medications.  Labwork:  BMET, TSH, Liver function - orders given today.  Office will contact with results via phone or letter.    Testing/Procedures: none  Follow-Up: Your physician wants you to follow up in: 6 months.  You will receive a reminder letter in the mail one-two months in advance.  If you don't receive a letter, please call our office to schedule the follow up appointment   Any Other Special Instructions Will Be Listed Below (If Applicable). Automatic monthly remote checks.   If you need a refill on your cardiac medications before your next appointment, please call your pharmacy.

## 2016-05-04 DIAGNOSIS — E039 Hypothyroidism, unspecified: Secondary | ICD-10-CM | POA: Diagnosis not present

## 2016-05-04 DIAGNOSIS — R740 Nonspecific elevation of levels of transaminase and lactic acid dehydrogenase [LDH]: Secondary | ICD-10-CM | POA: Diagnosis not present

## 2016-05-04 DIAGNOSIS — E876 Hypokalemia: Secondary | ICD-10-CM | POA: Diagnosis not present

## 2016-05-08 ENCOUNTER — Encounter: Payer: Self-pay | Admitting: *Deleted

## 2016-05-08 ENCOUNTER — Encounter: Payer: Self-pay | Admitting: Internal Medicine

## 2016-05-09 LAB — CUP PACEART REMOTE DEVICE CHECK: MDC IDC SESS DTM: 20170907123527

## 2016-05-09 NOTE — Progress Notes (Signed)
Carelink summary report received. Battery status OK. Normal device function. No new symptom episodes, tachy episodes, brady, or pause episodes. 18.9% AF, V rates controlled, declines OAC. Monthly summary reports and ROV/PRN

## 2016-05-11 ENCOUNTER — Ambulatory Visit (INDEPENDENT_AMBULATORY_CARE_PROVIDER_SITE_OTHER): Payer: Medicare Other | Admitting: *Deleted

## 2016-05-11 DIAGNOSIS — R55 Syncope and collapse: Secondary | ICD-10-CM | POA: Diagnosis not present

## 2016-05-11 NOTE — Progress Notes (Signed)
Carelink Summary Report / Loop Recorder 

## 2016-05-13 ENCOUNTER — Encounter: Payer: Self-pay | Admitting: Internal Medicine

## 2016-05-22 LAB — CUP PACEART INCLINIC DEVICE CHECK: MDC IDC SESS DTM: 20170929125055

## 2016-06-08 ENCOUNTER — Ambulatory Visit (INDEPENDENT_AMBULATORY_CARE_PROVIDER_SITE_OTHER): Payer: Medicare Other | Admitting: *Deleted

## 2016-06-08 DIAGNOSIS — R55 Syncope and collapse: Secondary | ICD-10-CM

## 2016-06-08 NOTE — Progress Notes (Signed)
Carelink Summary Report / Loop Recorder 

## 2016-06-11 ENCOUNTER — Other Ambulatory Visit: Payer: Self-pay

## 2016-06-11 ENCOUNTER — Ambulatory Visit (INDEPENDENT_AMBULATORY_CARE_PROVIDER_SITE_OTHER): Payer: Medicare Other

## 2016-06-11 DIAGNOSIS — I350 Nonrheumatic aortic (valve) stenosis: Secondary | ICD-10-CM | POA: Diagnosis not present

## 2016-06-14 LAB — CUP PACEART REMOTE DEVICE CHECK
MDC IDC PG IMPLANT DT: 20170609
MDC IDC SESS DTM: 20171007124128

## 2016-06-14 NOTE — Progress Notes (Signed)
Carelink summary report received. Battery status OK. Normal device function. No new symptom episodes, tachy episodes, brady, or pause episodes. 27.8% AF, pt declines anticoagulation, on low dose amiodarone. Monthly summary reports and ROV/PRN

## 2016-06-15 ENCOUNTER — Telehealth: Payer: Self-pay | Admitting: *Deleted

## 2016-06-15 ENCOUNTER — Encounter: Payer: Self-pay | Admitting: Cardiology

## 2016-06-15 NOTE — Telephone Encounter (Signed)
Patient informed and copy sent to PCP. 

## 2016-06-15 NOTE — Telephone Encounter (Signed)
-----   Message from Satira Sark, MD sent at 06/12/2016  8:57 AM EST ----- Results reviewed. Aortic stenosis is more consistently in the severe range by the most recent study with some increase in aortic gradients. I will ask Dr. Burt Knack to review the images to see what he thinks, and can talk with Ms. Feeley about her symptoms at office follow-up. A copy of this test should be forwarded to Gar Ponto, MD.

## 2016-06-17 ENCOUNTER — Telehealth: Payer: Self-pay | Admitting: *Deleted

## 2016-06-17 NOTE — Telephone Encounter (Signed)
I would not necessarily tell her not to continue her baseline swimming plan unless she has had a change in symptoms.

## 2016-06-17 NOTE — Telephone Encounter (Signed)
Sent via email -  Per Dr. Domenic Polite - I would not necessarily tell her not to continue her baseline swimming plan unless she has had a change in symptoms.   See reply above to your question in regards to swimming. Please call the office if need any further assistance.   Nyra Capes, LPN

## 2016-06-17 NOTE — Telephone Encounter (Signed)
-----   Message ----- From: [name] Sent: 06/15/2016  3:49 PM To: [facility] Triage Subject: Visit Follow-Up Question             Is it all right to swim laps after this test result? Thanks. Christine Pugh

## 2016-06-17 NOTE — Telephone Encounter (Signed)
Will defer to Dr. Domenic Polite in light of recent Echo results.

## 2016-06-30 DIAGNOSIS — I350 Nonrheumatic aortic (valve) stenosis: Secondary | ICD-10-CM | POA: Diagnosis not present

## 2016-06-30 DIAGNOSIS — I482 Chronic atrial fibrillation: Secondary | ICD-10-CM | POA: Diagnosis not present

## 2016-06-30 DIAGNOSIS — I10 Essential (primary) hypertension: Secondary | ICD-10-CM | POA: Diagnosis not present

## 2016-06-30 DIAGNOSIS — M545 Low back pain: Secondary | ICD-10-CM | POA: Diagnosis not present

## 2016-06-30 DIAGNOSIS — I8391 Asymptomatic varicose veins of right lower extremity: Secondary | ICD-10-CM | POA: Diagnosis not present

## 2016-06-30 DIAGNOSIS — E039 Hypothyroidism, unspecified: Secondary | ICD-10-CM | POA: Diagnosis not present

## 2016-06-30 DIAGNOSIS — Z6826 Body mass index (BMI) 26.0-26.9, adult: Secondary | ICD-10-CM | POA: Diagnosis not present

## 2016-06-30 DIAGNOSIS — E782 Mixed hyperlipidemia: Secondary | ICD-10-CM | POA: Diagnosis not present

## 2016-07-08 ENCOUNTER — Ambulatory Visit (INDEPENDENT_AMBULATORY_CARE_PROVIDER_SITE_OTHER): Payer: Medicare Other | Admitting: *Deleted

## 2016-07-08 DIAGNOSIS — R55 Syncope and collapse: Secondary | ICD-10-CM

## 2016-07-08 NOTE — Progress Notes (Signed)
Carelink Summary Report / Loop Recorder 

## 2016-07-12 ENCOUNTER — Encounter: Payer: Self-pay | Admitting: Internal Medicine

## 2016-07-12 NOTE — Progress Notes (Signed)
Cardiology Office Note  Date: 07/13/2016   ID: Christine Pugh, DOB 1922-03-05, MRN [id number]  PCP: Gar Ponto, MD  Primary Cardiologist: Rozann Lesches, MD   Chief Complaint  Patient presents with  . Aortic Stenosis  . Atrial Flutter    History of Present Illness: Christine Pugh is a 82 y.o. female last seen in August. She is here today with her son for a follow-up visit. She does not report any palpitations or exertional chest pain, has realized a general decline in exertional capacity with time. She has not had any syncopal events.  Follow-up with Dr. Rayann Heman noted in September, [place] in place. She has had no documented bradyarrhythmias so far. He recommended weaning amiodarone as well. Atrial arrhythmia noted 28% on last ILR interrogation. She has consistently declined anticoagulation.  Recent follow-up echocardiogram shows overall severe aortic stenosis as outlined below. This has gradually progressed over time. She has already seen Dr. Burt Knack for discussion of possible TAVR. She remains undecided as to whether she would want to pursue this or not, seems to be leaning against it. Her son has more questions however, and a follow-up consultation with Dr. Burt Knack would be reasonable. I did make it clear that I am not at all pushing her to have this done, particularly at age 58.  Past Medical History:  Diagnosis Date  . Aortic insufficiency    Mild  . Aortic stenosis    Moderate to severe  . Atypical atrial flutter ([place])   . Benign essential HTN   . Hypothyroidism     Past Surgical History:  Procedure Laterality Date  . APPENDECTOMY    . BREAST LUMPECTOMY    . dental implant    . EP IMPLANTABLE DEVICE N/A 01/10/2016   Procedure: Loop Recorder Insertion;  Surgeon: [name], MD;  Location: [place] [facility];  Service: Cardiovascular;  Laterality: N/A;  . THYROIDECTOMY    . TONSILLECTOMY    . TOTAL ABDOMINAL HYSTERECTOMY    . VITRECTOMY AND CATARACT      Current  Outpatient Prescriptions  Medication Sig Dispense Refill  . aspirin EC 81 MG tablet Take 81 mg by mouth daily.    . cyproheptadine (PERIACTIN) 4 MG tablet Take 4 mg by mouth 3 (three) times daily as needed for allergies. Reported on 08/27/2015    . levothyroxine (SYNTHROID, LEVOTHROID) 137 MCG tablet Take 137 mcg by mouth daily before breakfast.     . Lutein-Zeaxanthin 25-5 MG CAPS Take 1 capsule by mouth 2 (two) times daily.    . metoprolol succinate (TOPROL XL) 25 MG 24 hr tablet Take 1 tablet (25 mg total) by mouth 2 (two) times daily. 180 tablet 3  . Multiple Vitamin (MULTIVITAMIN) tablet Take 1 tablet by mouth daily.      . Red Yeast Rice 600 MG TABS Take 600 mg by mouth daily.     Marland Kitchen triamcinolone cream (KENALOG) 0.1 % Apply 1 application topically daily as needed (irritated skin).     Marland Kitchen triamterene-hydrochlorothiazide (DYAZIDE) 37.5-25 MG per capsule Take 1 capsule by mouth daily.      Marland Kitchen amiodarone (PACERONE) 100 MG tablet Take 1 tablet (100 mg total) by mouth daily. 90 tablet 3   No current facility-administered medications for this visit.    Allergies:  Patient has no known allergies.   Social History: The patient  reports that she has never smoked. She has never used smokeless tobacco. She reports that she does not drink alcohol or use  drugs.   ROS:  Please see the history of present illness. Otherwise, complete review of systems is positive for none.  All other systems are reviewed and negative.   Physical Exam: VS:  BP 112/72   Pulse 89   Ht 5\' 3"  (1.6 m)   Wt 168 lb (76.2 kg)   SpO2 97%   BMI 29.76 kg/m , BMI Body mass index is 29.76 kg/m.  Wt Readings from Last 3 Encounters:  07/13/16 168 lb (76.2 kg)  05/01/16 154 lb (69.9 kg)  03/16/16 152 lb (68.9 kg)    Elderly woman, appears comfortable at rest. HEENT: Conjunctiva and lids normal, oropharynx clear.  Neck: Supple, no carotid bruits, no thyromegaly.  Lungs: Clear to auscultation, nonlabored.  Cardiac:  Irregular, 3/6 systolic murmur heard best at the base, no loud diastolic murmur, no S3.  Abdomen: Soft, nontender.  Extremities: No leg edema, distal pulses 2+.  Neuropsychiatric: Alert and oriented 3, affect appropriate. Skin: Warm and dry. Musculoskeletal: Kyphosis. Neuropsychiatric: Alert and oriented 3, affect appropriate.  ECG: I personally reviewed the tracing from 03/16/2016 which showed atypical atrial flutter with variable block and LAFB.  Recent Labwork: 12/19/2015: ALT 18; AST 28; BUN 17; Creatinine, Ser 0.73; Hemoglobin 15.3; Platelets 190; Potassium 3.9; Sodium 134  October 2017: BUN 19, creatinine 0.9, potassium 4.2, AST 24, ALT 13, TSH 0.36   Other Studies Reviewed Today:  Echocardiogram 06/11/2016: Study Conclusions  - Left ventricle: The cavity size was normal. Wall thickness was   increased in a pattern of mild LVH. Systolic function was normal.    The estimated ejection fraction was in the range of 60% to 65%.   Wall motion was normal; there were no regional wall motion   abnormalities. The study is not technically sufficient to allow   evaluation of LV diastolic function. - Aortic valve: Moderately calcified annulus. Trileaflet; mildly   thickened, severely calcified leaflets. There was severe   stenosis. There was mild regurgitation. Mean gradient (S): 22 mm   Hg. Peak gradient (S): 33 mm Hg. VTI ratio of LVOT to aortic   valve: 0.39. Valve area (VTI): 0.99 cm^2. Valve area (Vmax): 1.07   cm^2. Valve area (Vmean): 0.96 cm^2. - Aorta: Ascending aortic diameter: 42 mm (S). - Ascending aorta: The ascending aorta was mildly dilated. - Mitral valve: Calcified annulus. Mildly thickened, mildly   calcified leaflets . There was mild regurgitation. - Left atrium: The atrium was moderately dilated. - Right ventricle: The cavity size was mildly dilated. Systolic   function was reduced. - Right atrium: Central venous pressure (est): 8 mm Hg. - Tricuspid valve: There  was trivial regurgitation. - Pulmonary arteries: PA peak pressure: 47 mm Hg (S). - Pericardium, extracardiac: There was no pericardial effusion.  Impressions:  - Mild LVH with LVEF 60-65%. Indeterminate diastolic function.   Moderate left atrial enlargement. Mitral annular calcification   with calcified and thickened mitral leaflets. MIld mitral   regurgitation. Overall severe calcific aortic stenosis as   outlined - relatively low gradients, but planimetry and LVOT/AV   ratio closer to severe range. Mild aortic regurgitation. MIldly   dilated ascending aorta. Mild RV enlargement with decreased   contraction. Trivial tricuspid regurgitation with PASP 47 mmHg.  Assessment and Plan:  1. Severe, low gradient aortic stenosis as outlined above. LVEF remains normal range at 60-65%. She does remain very functional at age 29. I am not at all pushing her for TAVR, but she and her son have talked about this  and would like to follow-up with Dr. Burt Knack to at least get his opinion and try to formalize the plan going forward. She seems to be leaning against the procedure, which would not be unreasonable at this point. I will arrange a follow-up visit.  2. Paroxysmal, atypical atrial flutter. Reduce amiodarone to 100 mg daily and continue beta blocker. She is not reporting palpitations. She has declined anticoagulation.  3. History of recurrent syncope with suspicion for possible bradycarrhythmia, although none ever documented so far. She does have an ILR in place, followed by Dr. Rayann Heman.  4. Essential hypertension, blood pressure is well controlled.  Current medicines were reviewed with the patient today.  Disposition: Return visit to see Dr. Burt Knack, follow-up with me in 3 months.  Signed, Satira Sark, MD, [facility] 07/13/2016 11:37 AM    [facility] at [address], [place], [place] [address] Phone: [phone]; Fax: [phone]

## 2016-07-13 ENCOUNTER — Ambulatory Visit (INDEPENDENT_AMBULATORY_CARE_PROVIDER_SITE_OTHER): Payer: Medicare Other | Admitting: Cardiology

## 2016-07-13 ENCOUNTER — Encounter: Payer: Self-pay | Admitting: Cardiology

## 2016-07-13 VITALS — BP 112/72 | HR 89 | Ht 63.0 in | Wt 168.0 lb

## 2016-07-13 DIAGNOSIS — I10 Essential (primary) hypertension: Secondary | ICD-10-CM | POA: Diagnosis not present

## 2016-07-13 DIAGNOSIS — Z87898 Personal history of other specified conditions: Secondary | ICD-10-CM | POA: Diagnosis not present

## 2016-07-13 DIAGNOSIS — I350 Nonrheumatic aortic (valve) stenosis: Secondary | ICD-10-CM | POA: Diagnosis not present

## 2016-07-13 DIAGNOSIS — I484 Atypical atrial flutter: Secondary | ICD-10-CM | POA: Diagnosis not present

## 2016-07-13 MED ORDER — METOPROLOL SUCCINATE ER 25 MG PO TB24: 25.0000 mg | ORAL_TABLET | Freq: Two times a day (BID) | ORAL | 3 refills | Status: DC

## 2016-07-13 MED ORDER — AMIODARONE HCL 100 MG PO TABS: 100.0000 mg | ORAL_TABLET | Freq: Every day | ORAL | 3 refills | Status: DC

## 2016-07-13 NOTE — Patient Instructions (Signed)
Your physician recommends that you schedule a follow-up appointment in: [place]  Your physician has recommended you make the following change in your medication:   DECREASE AMIODARONE 100 MG DAILY  WE WILL GET YOU SCHEDULED FOR A FOLLOW UP WITH DR. Burt Knack  Thank you for choosing [facility]!!

## 2016-07-14 ENCOUNTER — Telehealth: Payer: Self-pay | Admitting: Cardiology

## 2016-07-14 NOTE — Telephone Encounter (Signed)
After thinking it over, she does not want to proceed with seeing Dr Burt Knack.  She does not want to have surgery at her age of 37.

## 2016-07-14 NOTE — Telephone Encounter (Signed)
That is okay with me as long as she and her son have discussed it and are both comfortable with this decision. It would still not be unreasonable for her to talk with Dr. Burt Knack and bring some closure to this issue.

## 2016-07-14 NOTE — Telephone Encounter (Signed)
Spoke w/ pt and informed her that she does not need to take her home monitor with her on her week long vacation. Informed pt to just send a manual transmission with the monitor once she is back home to ensure that the monitor reconnects with the loop recorder. Pt verbalized understanding.

## 2016-07-14 NOTE — Telephone Encounter (Signed)
Pt aware and appreciative of call.

## 2016-07-24 LAB — CUP PACEART REMOTE DEVICE CHECK
Implantable Pulse Generator Implant Date: 20170609
MDC IDC SESS DTM: 20171106123855

## 2016-07-24 NOTE — Progress Notes (Signed)
Carelink summary report received. Battery status OK. Normal device function. No new symptom episodes, tachy episodes, brady, or pause episodes. 3 AF 34.2% pt declines anticoag. per EPIC note, +amio. Monthly summary reports and ROV/PRN

## 2016-07-28 ENCOUNTER — Encounter: Payer: Self-pay | Admitting: Cardiology

## 2016-07-30 ENCOUNTER — Telehealth: Payer: Self-pay | Admitting: Cardiology

## 2016-07-30 NOTE — Telephone Encounter (Signed)
Patient called back wanting to make clear that she does not want to make any appointments specialist in [place]

## 2016-07-30 NOTE — Telephone Encounter (Signed)
Noted. No appts made and already addressed in previous telephone note

## 2016-08-03 DIAGNOSIS — I639 Cerebral infarction, unspecified: Secondary | ICD-10-CM

## 2016-08-03 DIAGNOSIS — I749 Embolism and thrombosis of unspecified artery: Secondary | ICD-10-CM

## 2016-08-03 HISTORY — DX: Cerebral infarction, unspecified: I63.9

## 2016-08-03 HISTORY — DX: Embolism and thrombosis of unspecified artery: I74.9

## 2016-08-07 ENCOUNTER — Ambulatory Visit (INDEPENDENT_AMBULATORY_CARE_PROVIDER_SITE_OTHER): Payer: Medicare Other | Admitting: *Deleted

## 2016-08-07 ENCOUNTER — Ambulatory Visit: Payer: Medicare Other | Admitting: Physician Assistant

## 2016-08-07 DIAGNOSIS — R55 Syncope and collapse: Secondary | ICD-10-CM | POA: Diagnosis not present

## 2016-08-07 NOTE — Progress Notes (Signed)
Carelink Summary Report / Loop Recorder 

## 2016-08-14 DIAGNOSIS — E039 Hypothyroidism, unspecified: Secondary | ICD-10-CM | POA: Diagnosis not present

## 2016-08-23 ENCOUNTER — Inpatient Hospital Stay (HOSPITAL_COMMUNITY): Payer: Medicare Other

## 2016-08-23 ENCOUNTER — Emergency Department (HOSPITAL_COMMUNITY): Payer: Medicare Other

## 2016-08-23 ENCOUNTER — Encounter (HOSPITAL_COMMUNITY): Payer: Self-pay | Admitting: *Deleted

## 2016-08-23 ENCOUNTER — Inpatient Hospital Stay (HOSPITAL_COMMUNITY): Payer: Medicare Other | Admitting: Anesthesiology

## 2016-08-23 ENCOUNTER — Inpatient Hospital Stay (HOSPITAL_COMMUNITY)
Admission: EM | Admit: 2016-08-23 | Discharge: 2016-08-28 | DRG: 252 | Disposition: A | Discharge status: Other Acute Inpatient Hospital | Payer: Medicare Other | Attending: Vascular Surgery | Admitting: Vascular Surgery

## 2016-08-23 ENCOUNTER — Encounter (HOSPITAL_COMMUNITY)
Admission: EM | Disposition: A | Discharge status: Other Acute Inpatient Hospital | Payer: Self-pay | Source: Home / Self Care | Attending: Vascular Surgery

## 2016-08-23 ENCOUNTER — Emergency Department (HOSPITAL_COMMUNITY): Payer: Medicare Other | Admitting: Critical Care Medicine

## 2016-08-23 DIAGNOSIS — R269 Unspecified abnormalities of gait and mobility: Secondary | ICD-10-CM | POA: Diagnosis not present

## 2016-08-23 DIAGNOSIS — I4892 Unspecified atrial flutter: Secondary | ICD-10-CM | POA: Diagnosis present

## 2016-08-23 DIAGNOSIS — Z66 Do not resuscitate: Secondary | ICD-10-CM | POA: Diagnosis not present

## 2016-08-23 DIAGNOSIS — K649 Unspecified hemorrhoids: Secondary | ICD-10-CM | POA: Diagnosis not present

## 2016-08-23 DIAGNOSIS — Y838 Other surgical procedures as the cause of abnormal reaction of the patient, or of later complication, without mention of misadventure at the time of the procedure: Secondary | ICD-10-CM | POA: Diagnosis not present

## 2016-08-23 DIAGNOSIS — I70208 Unspecified atherosclerosis of native arteries of extremities, other extremity: Secondary | ICD-10-CM | POA: Diagnosis not present

## 2016-08-23 DIAGNOSIS — E039 Hypothyroidism, unspecified: Secondary | ICD-10-CM | POA: Diagnosis present

## 2016-08-23 DIAGNOSIS — I484 Atypical atrial flutter: Secondary | ICD-10-CM | POA: Diagnosis not present

## 2016-08-23 DIAGNOSIS — R0602 Shortness of breath: Secondary | ICD-10-CM | POA: Diagnosis not present

## 2016-08-23 DIAGNOSIS — I82A12 Acute embolism and thrombosis of left axillary vein: Secondary | ICD-10-CM | POA: Diagnosis present

## 2016-08-23 DIAGNOSIS — I63442 Cerebral infarction due to embolism of left cerebellar artery: Secondary | ICD-10-CM | POA: Diagnosis present

## 2016-08-23 DIAGNOSIS — R414 Neurologic neglect syndrome: Secondary | ICD-10-CM | POA: Diagnosis not present

## 2016-08-23 DIAGNOSIS — R0902 Hypoxemia: Secondary | ICD-10-CM | POA: Diagnosis not present

## 2016-08-23 DIAGNOSIS — Y92238 Other place in hospital as the place of occurrence of the external cause: Secondary | ICD-10-CM | POA: Diagnosis not present

## 2016-08-23 DIAGNOSIS — I69393 Ataxia following cerebral infarction: Secondary | ICD-10-CM | POA: Diagnosis not present

## 2016-08-23 DIAGNOSIS — I6601 Occlusion and stenosis of right middle cerebral artery: Secondary | ICD-10-CM

## 2016-08-23 DIAGNOSIS — Z9889 Other specified postprocedural states: Secondary | ICD-10-CM | POA: Diagnosis not present

## 2016-08-23 DIAGNOSIS — Z7982 Long term (current) use of aspirin: Secondary | ICD-10-CM

## 2016-08-23 DIAGNOSIS — R29810 Facial weakness: Secondary | ICD-10-CM | POA: Diagnosis not present

## 2016-08-23 DIAGNOSIS — M79622 Pain in left upper arm: Secondary | ICD-10-CM

## 2016-08-23 DIAGNOSIS — R404 Transient alteration of awareness: Secondary | ICD-10-CM | POA: Diagnosis not present

## 2016-08-23 DIAGNOSIS — I639 Cerebral infarction, unspecified: Secondary | ICD-10-CM

## 2016-08-23 DIAGNOSIS — H518 Other specified disorders of binocular movement: Secondary | ICD-10-CM | POA: Diagnosis present

## 2016-08-23 DIAGNOSIS — I63511 Cerebral infarction due to unspecified occlusion or stenosis of right middle cerebral artery: Secondary | ICD-10-CM | POA: Diagnosis not present

## 2016-08-23 DIAGNOSIS — Z8673 Personal history of transient ischemic attack (TIA), and cerebral infarction without residual deficits: Secondary | ICD-10-CM

## 2016-08-23 DIAGNOSIS — E876 Hypokalemia: Secondary | ICD-10-CM | POA: Diagnosis present

## 2016-08-23 DIAGNOSIS — I63311 Cerebral infarction due to thrombosis of right middle cerebral artery: Secondary | ICD-10-CM | POA: Diagnosis not present

## 2016-08-23 DIAGNOSIS — I97821 Postprocedural cerebrovascular infarction during other surgery: Secondary | ICD-10-CM | POA: Diagnosis not present

## 2016-08-23 DIAGNOSIS — I998 Other disorder of circulatory system: Secondary | ICD-10-CM | POA: Diagnosis not present

## 2016-08-23 DIAGNOSIS — R209 Unspecified disturbances of skin sensation: Secondary | ICD-10-CM | POA: Diagnosis not present

## 2016-08-23 DIAGNOSIS — M79602 Pain in left arm: Secondary | ICD-10-CM | POA: Diagnosis not present

## 2016-08-23 DIAGNOSIS — Z79899 Other long term (current) drug therapy: Secondary | ICD-10-CM | POA: Diagnosis not present

## 2016-08-23 DIAGNOSIS — I69398 Other sequelae of cerebral infarction: Secondary | ICD-10-CM | POA: Diagnosis not present

## 2016-08-23 DIAGNOSIS — I69328 Other speech and language deficits following cerebral infarction: Secondary | ICD-10-CM | POA: Diagnosis not present

## 2016-08-23 DIAGNOSIS — I749 Embolism and thrombosis of unspecified artery: Secondary | ICD-10-CM | POA: Diagnosis not present

## 2016-08-23 DIAGNOSIS — I63412 Cerebral infarction due to embolism of left middle cerebral artery: Secondary | ICD-10-CM | POA: Diagnosis not present

## 2016-08-23 DIAGNOSIS — I4891 Unspecified atrial fibrillation: Secondary | ICD-10-CM | POA: Diagnosis not present

## 2016-08-23 DIAGNOSIS — G8194 Hemiplegia, unspecified affecting left nondominant side: Secondary | ICD-10-CM | POA: Diagnosis not present

## 2016-08-23 DIAGNOSIS — I63411 Cerebral infarction due to embolism of right middle cerebral artery: Secondary | ICD-10-CM | POA: Diagnosis not present

## 2016-08-23 DIAGNOSIS — R21 Rash and other nonspecific skin eruption: Secondary | ICD-10-CM | POA: Diagnosis not present

## 2016-08-23 DIAGNOSIS — I10 Essential (primary) hypertension: Secondary | ICD-10-CM | POA: Diagnosis present

## 2016-08-23 DIAGNOSIS — H53462 Homonymous bilateral field defects, left side: Secondary | ICD-10-CM | POA: Diagnosis present

## 2016-08-23 DIAGNOSIS — H5340 Unspecified visual field defects: Secondary | ICD-10-CM | POA: Diagnosis present

## 2016-08-23 DIAGNOSIS — I6300 Cerebral infarction due to thrombosis of unspecified precerebral artery: Secondary | ICD-10-CM | POA: Diagnosis not present

## 2016-08-23 DIAGNOSIS — I7090 Unspecified atherosclerosis: Secondary | ICD-10-CM

## 2016-08-23 DIAGNOSIS — Z419 Encounter for procedure for purposes other than remedying health state, unspecified: Secondary | ICD-10-CM

## 2016-08-23 DIAGNOSIS — I359 Nonrheumatic aortic valve disorder, unspecified: Secondary | ICD-10-CM | POA: Diagnosis not present

## 2016-08-23 DIAGNOSIS — I352 Nonrheumatic aortic (valve) stenosis with insufficiency: Secondary | ICD-10-CM | POA: Diagnosis present

## 2016-08-23 DIAGNOSIS — I69392 Facial weakness following cerebral infarction: Secondary | ICD-10-CM | POA: Diagnosis not present

## 2016-08-23 DIAGNOSIS — E8779 Other fluid overload: Secondary | ICD-10-CM | POA: Diagnosis not present

## 2016-08-23 DIAGNOSIS — R4781 Slurred speech: Secondary | ICD-10-CM | POA: Diagnosis not present

## 2016-08-23 DIAGNOSIS — I480 Paroxysmal atrial fibrillation: Secondary | ICD-10-CM | POA: Diagnosis not present

## 2016-08-23 DIAGNOSIS — R410 Disorientation, unspecified: Secondary | ICD-10-CM

## 2016-08-23 DIAGNOSIS — R531 Weakness: Secondary | ICD-10-CM | POA: Diagnosis not present

## 2016-08-23 DIAGNOSIS — I742 Embolism and thrombosis of arteries of the upper extremities: Secondary | ICD-10-CM | POA: Diagnosis not present

## 2016-08-23 DIAGNOSIS — R52 Pain, unspecified: Secondary | ICD-10-CM | POA: Diagnosis not present

## 2016-08-23 DIAGNOSIS — E860 Dehydration: Secondary | ICD-10-CM | POA: Diagnosis not present

## 2016-08-23 DIAGNOSIS — R262 Difficulty in walking, not elsewhere classified: Secondary | ICD-10-CM

## 2016-08-23 DIAGNOSIS — I509 Heart failure, unspecified: Secondary | ICD-10-CM | POA: Diagnosis not present

## 2016-08-23 DIAGNOSIS — I69354 Hemiplegia and hemiparesis following cerebral infarction affecting left non-dominant side: Secondary | ICD-10-CM | POA: Diagnosis present

## 2016-08-23 HISTORY — DX: Cerebral infarction due to embolism of right cerebellar artery: I63.441

## 2016-08-23 HISTORY — DX: Other disorder of circulatory system: I99.8

## 2016-08-23 HISTORY — PX: RADIOLOGY WITH ANESTHESIA: SHX6223

## 2016-08-23 HISTORY — PX: IR GENERIC HISTORICAL: IMG1180011

## 2016-08-23 HISTORY — PX: THROMBECTOMY BRACHIAL ARTERY: SHX6649

## 2016-08-23 LAB — DIFFERENTIAL
Basophils Absolute: 0 10*3/uL (ref 0.0–0.1)
Basophils Relative: 0 %
EOS PCT: 1 %
Eosinophils Absolute: 0.1 10*3/uL (ref 0.0–0.7)
LYMPHS ABS: 0.9 10*3/uL (ref 0.7–4.0)
LYMPHS PCT: 9 %
MONO ABS: 0.9 10*3/uL (ref 0.1–1.0)
Monocytes Relative: 9 %
Neutro Abs: 8.4 10*3/uL — ABNORMAL HIGH (ref 1.7–7.7)
Neutrophils Relative %: 81 %

## 2016-08-23 LAB — CBC
HCT: 39.6 % (ref 36.0–46.0)
HEMOGLOBIN: 13.2 g/dL (ref 12.0–15.0)
MCH: 32.5 pg (ref 26.0–34.0)
MCHC: 33.3 g/dL (ref 30.0–36.0)
MCV: 97.5 fL (ref 78.0–100.0)
PLATELETS: 199 10*3/uL (ref 150–400)
RBC: 4.06 MIL/uL (ref 3.87–5.11)
RDW: 13.9 % (ref 11.5–15.5)
WBC: 10.3 10*3/uL (ref 4.0–10.5)

## 2016-08-23 LAB — ETHANOL: Alcohol, Ethyl (B): <5 mg/dL (ref <5)

## 2016-08-23 LAB — COMPREHENSIVE METABOLIC PANEL
ALK PHOS: 76 U/L (ref 38–126)
ALT: 25 U/L (ref 14–54)
ANION GAP: 13 (ref 5–15)
AST: 36 U/L (ref 15–41)
Albumin: 3.6 g/dL (ref 3.5–5.0)
BILIRUBIN TOTAL: 1 mg/dL (ref 0.3–1.2)
BUN: 22 mg/dL — ABNORMAL HIGH (ref 6–20)
CALCIUM: 9 mg/dL (ref 8.9–10.3)
CO2: 25 mmol/L (ref 22–32)
Chloride: 97 mmol/L — ABNORMAL LOW (ref 101–111)
Creatinine, Ser: 0.94 mg/dL (ref 0.44–1.00)
GFR calc non Af Amer: 51 mL/min — ABNORMAL LOW (ref >60)
GFR, EST AFRICAN AMERICAN: 59 mL/min — AB (ref >60)
Glucose, Bld: 139 mg/dL — ABNORMAL HIGH (ref 65–99)
Potassium: 3.2 mmol/L — ABNORMAL LOW (ref 3.5–5.1)
Sodium: 135 mmol/L (ref 135–145)
TOTAL PROTEIN: 6.4 g/dL — AB (ref 6.5–8.1)

## 2016-08-23 LAB — BLOOD GAS, ARTERIAL
Acid-Base Excess: 0.5 mmol/L (ref 0.0–2.0)
BICARBONATE: 23.7 mmol/L (ref 20.0–28.0)
Drawn by: 40415
FIO2: 40
LHR: 16 {breaths}/min
MECHVT: 420 mL
O2 SAT: 98 %
PATIENT TEMPERATURE: 97.2
PCO2 ART: 31.6 mmHg — AB (ref 32.0–48.0)
PEEP/CPAP: 5 cmH2O
PO2 ART: 108 mmHg (ref 83.0–108.0)
pH, Arterial: 7.485 — ABNORMAL HIGH (ref 7.350–7.450)

## 2016-08-23 LAB — PROTIME-INR
INR: 1
PROTHROMBIN TIME: 13.2 s (ref 11.4–15.2)

## 2016-08-23 LAB — TROPONIN I: Troponin I: <0.03 ng/mL (ref <0.03)

## 2016-08-23 LAB — GLUCOSE, CAPILLARY: GLUCOSE-CAPILLARY: 121 mg/dL — AB (ref 65–99)

## 2016-08-23 LAB — APTT: aPTT: 30 seconds (ref 24–36)

## 2016-08-23 SURGERY — RADIOLOGY WITH ANESTHESIA
Anesthesia: General | Laterality: Bilateral

## 2016-08-23 SURGERY — THROMBECTOMY, ARTERY, BRACHIAL
Anesthesia: Monitor Anesthesia Care | Laterality: Left

## 2016-08-23 MED ORDER — SODIUM CHLORIDE 0.9 % IV SOLN: INTRAVENOUS | Status: DC

## 2016-08-23 MED ORDER — DEXTROSE 5 % IV SOLN
1.5000 g | INTRAVENOUS | Status: AC
  Administered 2016-08-23: 1.5 g via INTRAVENOUS
  Filled 2016-08-23: qty 1.5

## 2016-08-23 MED ORDER — EPHEDRINE SULFATE 50 MG/ML IJ SOLN
INTRAMUSCULAR | Status: DC | PRN
  Administered 2016-08-23 (×3): 5 mg via INTRAVENOUS

## 2016-08-23 MED ORDER — PROPOFOL 500 MG/50ML IV EMUL
INTRAVENOUS | Status: DC | PRN
  Administered 2016-08-23: 25 ug/kg/min via INTRAVENOUS

## 2016-08-23 MED ORDER — HYDRALAZINE HCL 20 MG/ML IJ SOLN: 5.0000 mg | INTRAMUSCULAR | Status: DC | PRN

## 2016-08-23 MED ORDER — LIDOCAINE HCL (PF) 1 % IJ SOLN
INTRAMUSCULAR | Status: AC
  Filled 2016-08-23: qty 30

## 2016-08-23 MED ORDER — NITROGLYCERIN 1 MG/10 ML FOR IR/CATH LAB
INTRA_ARTERIAL | Status: AC
  Filled 2016-08-23: qty 10

## 2016-08-23 MED ORDER — CYPROHEPTADINE HCL 4 MG PO TABS
4.0000 mg | ORAL_TABLET | Freq: Three times a day (TID) | ORAL | Status: DC | PRN
  Administered 2016-08-25 – 2016-08-27 (×2): 4 mg via ORAL
  Filled 2016-08-23 (×4): qty 1

## 2016-08-23 MED ORDER — DEXTROSE 5 % IV SOLN
1.5000 g | Freq: Two times a day (BID) | INTRAVENOUS | Status: AC
  Administered 2016-08-23 – 2016-08-24 (×2): 1.5 g via INTRAVENOUS
  Filled 2016-08-23 (×2): qty 1.5

## 2016-08-23 MED ORDER — PHENYLEPHRINE HCL 10 MG/ML IJ SOLN
INTRAVENOUS | Status: DC | PRN
  Administered 2016-08-23: 25 ug/min via INTRAVENOUS

## 2016-08-23 MED ORDER — METOPROLOL SUCCINATE ER 25 MG PO TB24: 25.0000 mg | ORAL_TABLET | Freq: Two times a day (BID) | ORAL | Status: DC

## 2016-08-23 MED ORDER — BISACODYL 10 MG RE SUPP: 10.0000 mg | Freq: Every day | RECTAL | Status: DC | PRN

## 2016-08-23 MED ORDER — HEPARIN SODIUM (PORCINE) 1000 UNIT/ML IJ SOLN
INTRAMUSCULAR | Status: DC | PRN
  Administered 2016-08-23: 7000 [IU] via INTRAVENOUS

## 2016-08-23 MED ORDER — HEPARIN BOLUS VIA INFUSION: 3500.0000 [IU] | Freq: Once | INTRAVENOUS | Status: DC

## 2016-08-23 MED ORDER — FENTANYL CITRATE (PF) 100 MCG/2ML IJ SOLN
INTRAMUSCULAR | Status: DC | PRN
  Administered 2016-08-23 (×2): 12.5 ug via INTRAVENOUS
  Administered 2016-08-23: 25 ug via INTRAVENOUS

## 2016-08-23 MED ORDER — ACETAMINOPHEN 160 MG/5ML PO SOLN: 650.0000 mg | ORAL | Status: DC | PRN

## 2016-08-23 MED ORDER — ONDANSETRON HCL 4 MG/2ML IJ SOLN: 4.0000 mg | Freq: Four times a day (QID) | INTRAMUSCULAR | Status: DC | PRN

## 2016-08-23 MED ORDER — ACETAMINOPHEN 325 MG PO TABS: 325.0000 mg | ORAL_TABLET | ORAL | Status: DC | PRN

## 2016-08-23 MED ORDER — POTASSIUM CHLORIDE CRYS ER 20 MEQ PO TBCR: 20.0000 meq | EXTENDED_RELEASE_TABLET | Freq: Every day | ORAL | Status: DC | PRN

## 2016-08-23 MED ORDER — PANTOPRAZOLE SODIUM 40 MG PO TBEC
40.0000 mg | DELAYED_RELEASE_TABLET | Freq: Every day | ORAL | Status: DC
  Administered 2016-08-24 – 2016-08-28 (×5): 40 mg via ORAL
  Filled 2016-08-23 (×5): qty 1

## 2016-08-23 MED ORDER — MAGNESIUM SULFATE 2 GM/50ML IV SOLN
2.0000 g | Freq: Every day | INTRAVENOUS | Status: DC | PRN
  Filled 2016-08-23: qty 50

## 2016-08-23 MED ORDER — IOPAMIDOL (ISOVUE-370) INJECTION 76%
INTRAVENOUS | Status: AC
  Filled 2016-08-23: qty 100

## 2016-08-23 MED ORDER — TRIAMCINOLONE ACETONIDE 0.1 % EX CREA
1.0000 "application " | TOPICAL_CREAM | Freq: Every day | CUTANEOUS | Status: DC | PRN
  Administered 2016-08-25 – 2016-08-27 (×2): 1 via TOPICAL
  Filled 2016-08-23 (×2): qty 15

## 2016-08-23 MED ORDER — MORPHINE SULFATE (PF) 2 MG/ML IV SOLN
1.0000 mg | INTRAVENOUS | Status: DC | PRN
  Administered 2016-08-26 (×2): 2 mg via INTRAVENOUS
  Filled 2016-08-23 (×2): qty 1

## 2016-08-23 MED ORDER — LACTATED RINGERS IV SOLN: INTRAVENOUS | Status: DC

## 2016-08-23 MED ORDER — FENTANYL CITRATE (PF) 100 MCG/2ML IJ SOLN
INTRAMUSCULAR | Status: DC | PRN
  Administered 2016-08-23: 100 ug via INTRAVENOUS

## 2016-08-23 MED ORDER — NICARDIPINE HCL IN NACL 20-0.86 MG/200ML-% IV SOLN
5.0000 mg/h | INTRAVENOUS | Status: DC
  Administered 2016-08-23: 5 mg/h via INTRAVENOUS
  Filled 2016-08-23: qty 200

## 2016-08-23 MED ORDER — POLYETHYLENE GLYCOL 3350 17 G PO PACK
17.0000 g | PACK | Freq: Every day | ORAL | Status: DC | PRN
  Administered 2016-08-27: 17 g via ORAL
  Filled 2016-08-23: qty 1

## 2016-08-23 MED ORDER — HEPARIN (PORCINE) IN NACL 100-0.45 UNIT/ML-% IJ SOLN: 700.0000 [IU]/h | INTRAMUSCULAR | Status: DC

## 2016-08-23 MED ORDER — CEFAZOLIN SODIUM-DEXTROSE 2-3 GM-% IV SOLR
INTRAVENOUS | Status: DC | PRN
  Administered 2016-08-23: 2 g via INTRAVENOUS

## 2016-08-23 MED ORDER — SODIUM CHLORIDE 0.9 % IV SOLN
INTRAVENOUS | Status: DC
  Administered 2016-08-23 (×2): via INTRAVENOUS

## 2016-08-23 MED ORDER — IOPAMIDOL (ISOVUE-370) INJECTION 76%
100.0000 mL | Freq: Once | INTRAVENOUS | Status: AC | PRN
  Administered 2016-08-23: 100 mL via INTRAVENOUS

## 2016-08-23 MED ORDER — SODIUM CHLORIDE 0.9 % IV SOLN
0.0000 ug/min | INTRAVENOUS | Status: DC
  Administered 2016-08-23: 30 ug/min via INTRAVENOUS
  Administered 2016-08-24: 20 ug/min via INTRAVENOUS
  Filled 2016-08-23 (×2): qty 1

## 2016-08-23 MED ORDER — TRIAMTERENE-HCTZ 37.5-25 MG PO CAPS
1.0000 | ORAL_CAPSULE | Freq: Every day | ORAL | Status: DC
  Administered 2016-08-26 – 2016-08-28 (×3): 1 via ORAL
  Filled 2016-08-23 (×9): qty 1

## 2016-08-23 MED ORDER — LIDOCAINE HCL (PF) 1 % IJ SOLN
INTRAMUSCULAR | Status: DC | PRN
  Administered 2016-08-23: 12 mL

## 2016-08-23 MED ORDER — ASPIRIN 81 MG PO CHEW
324.0000 mg | CHEWABLE_TABLET | Freq: Once | ORAL | Status: DC
Start: 2016-08-23 — End: 2016-08-23

## 2016-08-23 MED ORDER — LUTEIN-ZEAXANTHIN 25-5 MG PO CAPS: 1.0000 | ORAL_CAPSULE | Freq: Two times a day (BID) | ORAL | Status: DC

## 2016-08-23 MED ORDER — CEFAZOLIN SODIUM-DEXTROSE 2-4 GM/100ML-% IV SOLN
INTRAVENOUS | Status: AC
  Filled 2016-08-23: qty 100

## 2016-08-23 MED ORDER — LIDOCAINE 2% (20 MG/ML) 5 ML SYRINGE
INTRAMUSCULAR | Status: DC | PRN
  Administered 2016-08-23: 100 mg via INTRAVENOUS

## 2016-08-23 MED ORDER — SODIUM CHLORIDE 0.9 % IV SOLN
50.0000 ug/h | INTRAVENOUS | Status: DC
  Administered 2016-08-23: 50 ug/h via INTRAVENOUS
  Filled 2016-08-23: qty 50

## 2016-08-23 MED ORDER — IOPAMIDOL (ISOVUE-300) INJECTION 61%
INTRAVENOUS | Status: AC
  Administered 2016-08-23: 85 mL
  Filled 2016-08-23: qty 300

## 2016-08-23 MED ORDER — MIDAZOLAM HCL 2 MG/2ML IJ SOLN
INTRAMUSCULAR | Status: DC | PRN
  Administered 2016-08-23: 2 mg via INTRAVENOUS

## 2016-08-23 MED ORDER — TRAMADOL HCL 50 MG PO TABS
50.0000 mg | ORAL_TABLET | Freq: Three times a day (TID) | ORAL | Status: DC | PRN
  Administered 2016-08-25 – 2016-08-26 (×2): 50 mg via ORAL
  Filled 2016-08-23 (×2): qty 1

## 2016-08-23 MED ORDER — IOPAMIDOL (ISOVUE-300) INJECTION 61%
INTRAVENOUS | Status: AC
  Filled 2016-08-23: qty 50

## 2016-08-23 MED ORDER — ORAL CARE MOUTH RINSE
15.0000 mL | Freq: Four times a day (QID) | OROMUCOSAL | Status: DC
  Administered 2016-08-24 (×2): 15 mL via OROMUCOSAL

## 2016-08-23 MED ORDER — ASPIRIN EC 81 MG PO TBEC
81.0000 mg | DELAYED_RELEASE_TABLET | Freq: Every day | ORAL | Status: DC
  Administered 2016-08-24 – 2016-08-27 (×4): 81 mg via ORAL
  Filled 2016-08-23 (×4): qty 1

## 2016-08-23 MED ORDER — IOPAMIDOL (ISOVUE-300) INJECTION 61%
INTRAVENOUS | Status: AC
  Administered 2016-08-23: 85 mL
  Filled 2016-08-23: qty 100

## 2016-08-23 MED ORDER — AMIODARONE HCL 100 MG PO TABS
50.0000 mg | ORAL_TABLET | Freq: Every day | ORAL | Status: DC
  Filled 2016-08-23: qty 1

## 2016-08-23 MED ORDER — HYDROMORPHONE HCL 1 MG/ML IJ SOLN: 0.2500 mg | INTRAMUSCULAR | Status: DC | PRN

## 2016-08-23 MED ORDER — MIDAZOLAM HCL 2 MG/2ML IJ SOLN
INTRAMUSCULAR | Status: AC
  Filled 2016-08-23: qty 2

## 2016-08-23 MED ORDER — HEPARIN (PORCINE) IN NACL 100-0.45 UNIT/ML-% IJ SOLN
700.0000 [IU]/h | INTRAMUSCULAR | Status: DC
  Administered 2016-08-23: 700 [IU]/h via INTRAVENOUS
  Filled 2016-08-23: qty 250

## 2016-08-23 MED ORDER — LABETALOL HCL 5 MG/ML IV SOLN: 10.0000 mg | INTRAVENOUS | Status: DC | PRN

## 2016-08-23 MED ORDER — ACETAMINOPHEN 650 MG RE SUPP: 325.0000 mg | RECTAL | Status: DC | PRN

## 2016-08-23 MED ORDER — DOCUSATE SODIUM 100 MG PO CAPS
100.0000 mg | ORAL_CAPSULE | Freq: Every day | ORAL | Status: DC
  Administered 2016-08-25 – 2016-08-28 (×4): 100 mg via ORAL
  Filled 2016-08-23 (×4): qty 1

## 2016-08-23 MED ORDER — PROPOFOL 10 MG/ML IV BOLUS
INTRAVENOUS | Status: AC
  Filled 2016-08-23: qty 20

## 2016-08-23 MED ORDER — LABETALOL HCL 5 MG/ML IV SOLN
INTRAVENOUS | Status: DC | PRN
  Administered 2016-08-23: 10 mg via INTRAVENOUS

## 2016-08-23 MED ORDER — HEPARIN (PORCINE) IN NACL 100-0.45 UNIT/ML-% IJ SOLN: 1000.0000 [IU]/h | INTRAMUSCULAR | Status: DC

## 2016-08-23 MED ORDER — PROPOFOL 1000 MG/100ML IV EMUL
INTRAVENOUS | Status: AC
  Filled 2016-08-23: qty 100

## 2016-08-23 MED ORDER — SODIUM CHLORIDE 0.9 % IV SOLN
30.0000 meq | Freq: Once | INTRAVENOUS | Status: AC
  Administered 2016-08-23: 30 meq via INTRAVENOUS
  Filled 2016-08-23: qty 15

## 2016-08-23 MED ORDER — ALUM & MAG HYDROXIDE-SIMETH 200-200-20 MG/5ML PO SUSP: 15.0000 mL | ORAL | Status: DC | PRN

## 2016-08-23 MED ORDER — 0.9 % SODIUM CHLORIDE (POUR BTL) OPTIME
TOPICAL | Status: DC | PRN
  Administered 2016-08-23: 1000 mL

## 2016-08-23 MED ORDER — CHLORHEXIDINE GLUCONATE 0.12% ORAL RINSE (MEDLINE KIT)
15.0000 mL | Freq: Two times a day (BID) | OROMUCOSAL | Status: DC
  Administered 2016-08-23 – 2016-08-24 (×2): 15 mL via OROMUCOSAL

## 2016-08-23 MED ORDER — LEVOTHYROXINE SODIUM 112 MCG PO TABS
112.0000 ug | ORAL_TABLET | Freq: Every day | ORAL | Status: DC
  Administered 2016-08-24 – 2016-08-28 (×6): 112 ug via ORAL
  Filled 2016-08-23 (×7): qty 1

## 2016-08-23 MED ORDER — FENTANYL CITRATE (PF) 100 MCG/2ML IJ SOLN
INTRAMUSCULAR | Status: AC
  Filled 2016-08-23: qty 2

## 2016-08-23 MED ORDER — MEPERIDINE HCL 25 MG/ML IJ SOLN: 6.2500 mg | INTRAMUSCULAR | Status: DC | PRN

## 2016-08-23 MED ORDER — LACTATED RINGERS IV SOLN
INTRAVENOUS | Status: DC
  Administered 2016-08-23 (×2): via INTRAVENOUS

## 2016-08-23 MED ORDER — SODIUM CHLORIDE 0.9 % IV SOLN
INTRAVENOUS | Status: DC
  Administered 2016-08-23: 23:00:00 via INTRAVENOUS

## 2016-08-23 MED ORDER — PHENOL 1.4 % MT LIQD: 1.0000 | OROMUCOSAL | Status: DC | PRN

## 2016-08-23 MED ORDER — ACETAMINOPHEN 325 MG PO TABS: 650.0000 mg | ORAL_TABLET | ORAL | Status: DC | PRN

## 2016-08-23 MED ORDER — METOPROLOL TARTRATE 5 MG/5ML IV SOLN: 2.0000 mg | INTRAVENOUS | Status: DC | PRN

## 2016-08-23 MED ORDER — SODIUM CHLORIDE 0.9 % IV SOLN: 500.0000 mL | Freq: Once | INTRAVENOUS | Status: DC | PRN

## 2016-08-23 MED ORDER — ROCURONIUM BROMIDE 10 MG/ML (PF) SYRINGE
PREFILLED_SYRINGE | INTRAVENOUS | Status: DC | PRN
  Administered 2016-08-23: 10 mg via INTRAVENOUS
  Administered 2016-08-23: 30 mg via INTRAVENOUS
  Administered 2016-08-23: 40 mg via INTRAVENOUS
  Administered 2016-08-23: 30 mg via INTRAVENOUS
  Administered 2016-08-23: 60 mg via INTRAVENOUS
  Administered 2016-08-23: 30 mg via INTRAVENOUS

## 2016-08-23 MED ORDER — SODIUM CHLORIDE 0.9 % IV SOLN
INTRAVENOUS | Status: DC | PRN
  Administered 2016-08-23: 10:00:00 500 mL

## 2016-08-23 MED ORDER — ONDANSETRON HCL 4 MG/2ML IJ SOLN: 4.0000 mg | Freq: Once | INTRAMUSCULAR | Status: DC | PRN

## 2016-08-23 MED ORDER — ACETAMINOPHEN 650 MG RE SUPP
650.0000 mg | RECTAL | Status: DC | PRN
  Filled 2016-08-23: qty 1

## 2016-08-23 MED ORDER — ADULT MULTIVITAMIN W/MINERALS CH
1.0000 | ORAL_TABLET | Freq: Every day | ORAL | Status: DC
  Administered 2016-08-25 – 2016-08-28 (×4): 1 via ORAL
  Filled 2016-08-23 (×4): qty 1

## 2016-08-23 MED ORDER — PHENYLEPHRINE HCL 10 MG/ML IJ SOLN
INTRAVENOUS | Status: DC | PRN
  Administered 2016-08-23: 19:00:00 via INTRAVENOUS
  Administered 2016-08-23: 80 ug via INTRAVENOUS

## 2016-08-23 MED ORDER — GUAIFENESIN-DM 100-10 MG/5ML PO SYRP: 15.0000 mL | ORAL_SOLUTION | ORAL | Status: DC | PRN

## 2016-08-23 MED ORDER — PROPOFOL 10 MG/ML IV BOLUS
INTRAVENOUS | Status: DC | PRN
  Administered 2016-08-23: 100 mg via INTRAVENOUS

## 2016-08-23 SURGICAL SUPPLY — 40 items
ADH SKN CLS APL DERMABOND .7 (GAUZE/BANDAGES/DRESSINGS) ×1
AGENT HMST SPONGE THK3/8 (HEMOSTASIS)
ARMBAND PINK RESTRICT EXTREMIT (MISCELLANEOUS) ×1 IMPLANT
CANISTER SUCTION 2500CC (MISCELLANEOUS) ×2 IMPLANT
CANNULA VESSEL 3MM 2 BLNT TIP (CANNULA) ×2 IMPLANT
CATH EMB 3FR 80CM (CATHETERS) ×1 IMPLANT
CATH EMB 4FR 80CM (CATHETERS) ×2 IMPLANT
CLIP TI MEDIUM 6 (CLIP) ×2 IMPLANT
CLIP TI WIDE RED SMALL 6 (CLIP) ×2 IMPLANT
DECANTER SPIKE VIAL GLASS SM (MISCELLANEOUS) ×1 IMPLANT
DERMABOND ADVANCED (GAUZE/BANDAGES/DRESSINGS) ×1
DERMABOND ADVANCED .7 DNX12 (GAUZE/BANDAGES/DRESSINGS) ×1 IMPLANT
DRAPE X-RAY CASS 24X20 (DRAPES) IMPLANT
ELECT REM PT RETURN 9FT ADLT (ELECTROSURGICAL) ×2
ELECTRODE REM PT RTRN 9FT ADLT (ELECTROSURGICAL) ×1 IMPLANT
GEL ULTRASOUND 20GR AQUASONIC (MISCELLANEOUS) IMPLANT
GLOVE BIO SURGEON STRL SZ7.5 (GLOVE) ×2 IMPLANT
GLOVE BIOGEL PI IND STRL 6.5 (GLOVE) IMPLANT
GLOVE BIOGEL PI INDICATOR 6.5 (GLOVE) ×3
GLOVE SS N UNI LF 6.5 STRL (GLOVE) ×2 IMPLANT
GOWN STRL REUS W/ TWL LRG LVL3 (GOWN DISPOSABLE) ×3 IMPLANT
GOWN STRL REUS W/TWL LRG LVL3 (GOWN DISPOSABLE) ×6
HEMOSTAT SPONGE AVITENE ULTRA (HEMOSTASIS) IMPLANT
KIT BASIN OR (CUSTOM PROCEDURE TRAY) ×2 IMPLANT
KIT ROOM TURNOVER OR (KITS) ×2 IMPLANT
LOOP VESSEL MINI RED (MISCELLANEOUS) IMPLANT
NS IRRIG 1000ML POUR BTL (IV SOLUTION) ×2 IMPLANT
PACK CV ACCESS (CUSTOM PROCEDURE TRAY) ×2 IMPLANT
PAD ARMBOARD 7.5X6 YLW CONV (MISCELLANEOUS) ×4 IMPLANT
SET COLLECT BLD 21X3/4 12 (NEEDLE) IMPLANT
STOPCOCK 4 WAY LG BORE MALE ST (IV SETS) IMPLANT
SUT PROLENE 6 0 CC (SUTURE) ×2 IMPLANT
SUT PROLENE 7 0 BV1 MDA (SUTURE) ×1 IMPLANT
SUT VIC AB 3-0 SH 27 (SUTURE) ×2
SUT VIC AB 3-0 SH 27X BRD (SUTURE) ×1 IMPLANT
SUT VICRYL 4-0 PS2 18IN ABS (SUTURE) ×2 IMPLANT
SYRINGE 3CC LL L/F (MISCELLANEOUS) ×1 IMPLANT
TUBING EXTENTION W/L.L. (IV SETS) IMPLANT
UNDERPAD 30X30 (UNDERPADS AND DIAPERS) ×2 IMPLANT
WATER STERILE IRR 1000ML POUR (IV SOLUTION) ×2 IMPLANT

## 2016-08-23 NOTE — Progress Notes (Signed)
ANTICOAGULATION CONSULT NOTE - Initial Consult  Pharmacy Consult for Heparin  Indication: Occluded axillary artery  No Known Allergies  Patient Measurements: Height: 5\' 3"  (160 cm) Weight: 150 lb (68 kg) IBW/kg (Calculated) : 52.4   Vital Signs: Temp: 97.7 F (36.5 C) (01/21 0345) Temp Source: Oral (01/21 0345) BP: 112/66 (01/21 0600) Pulse Rate: 61 (01/21 0600)  Labs:  Recent Labs  08/23/16 0354  HGB 13.2  HCT 39.6  PLT 199  APTT 30  LABPROT 13.2  INR 1.00  CREATININE 0.94  TROPONINI <0.03    Estimated Creatinine Clearance (by C-G formula based on SCr of 0.94 mg/dL) Female: 34.6 mL/min Female: 39.5 mL/min   Medical History: Past Medical History:  Diagnosis Date  . Aortic insufficiency    Mild  . Aortic stenosis    Moderate to severe  . Atypical atrial flutter ([place])   . Benign essential HTN   . Hypothyroidism    Assessment: 83 y/o F initially presented as a code stroke and found to have a moderate sub-acute infarct. However, additional testing (CT Angio arm) revealed an occluded left axillary artery. Pt transferring from [facility] to [facility] for vascular consult. Starting low dose heparin with no bolus in setting of sub-acute stroke. CBC/renal function good. Baseline INR is normal. PTA meds reviewed.   Goal of Therapy:  Heparin level 0.3-0.5 units/ml Monitor platelets by anticoagulation protocol: Yes   Plan:  -NO BOLUS -Start heparin drip at 700 units/hr -1400 HL -Daily CBC/HL -Monitor for bleeding -F/U vascular surgery plans  Narda Bonds 08/23/2016,6:34 AM

## 2016-08-23 NOTE — Op Note (Signed)
Procedure:  Left brachial radial ulnar axillary embolectomy  Preop : Acute ischemia left arm Post op: same Anesthesia: Local Operative findings: high brachial bifurcation.  Separate arteriotomies of radial and ulnar arteries performed.  Operative details: After obtaining informed consent, the patient was taken to the operating. The patient was placed in supine position on operating table. After adequate sedation the patient's entire left upper extremity was prepped and draped in usual sterile fashion. Local anesthesia was attributed near the antecubital crease. A longitudinal incision was made in this location care down through the subcutaneous tissues down to what was thought to be the brachial artery. This was overall fairly small about 2 mm in diameter. I then found an additional arterial branch adjacent to this and more medial. It was also about 2 mm in diameter. It was apparent that the patient had a high brachial bifurcation. I did look through the incision more proximally to see if I could see where the 2 arteries joined up but apparently it seemed to be higher up in the arm. Therefore I decided to perform individualized embolectomies of the radial and ulnar arteries. Each of these arteries were dissected free circumferentially and small side branches ligated and divided between silk ties. The patient was given 7000 units of intravenous heparin. First the ulnar artery was controlled proximally and distally with fine bulldog clamps. A transverse arteriotomy was made and a #3 Fogarty catheter used to thrombectomize the proximal artery. Multiple passes were made until a clean pass was obtained. A large amount of embolic material was removed. This was sent to pathology as a specimen. The artery was then re-controlled proximally after good proximal flow was established. The Fogarty was then passed down the distal artery 2 clean passes were obtained and there was good backbleeding. The artery was controlled  with fine bulldog clamp. The arteriotomy was then repaired with a running 7-0 Prolene suture. There is now good pulsatile flow in the artery. Attention was then turned to the radial artery. This was controlled proximally and distally with fine bulldog clamps. A transverse arteriotomy was made. #3 Fogarty catheter was then passed proximally up the artery and multiple passes made with a large amount of embolic material retrieved. This was also sent to pathology as specimen. There was good arterial inflow. This was controlled proximally with a fine bulldog clamp. The Fogarty catheter was then passed distally multiple passes were made down the radial artery no further embolic material was retrieved 2 clean passes were obtained and it was good backbleeding. The arteriotomy was repaired with a running 7-0 Prolene suture. The clamps released and there was good pulsatile flow in the radial artery. There was good Doppler flow at the wrist and a palpable radial pulse with good Doppler radial and ulnar Doppler flow. Hemostasis was obtained with direct pressure. Subcutaneous tissues were reapproximated using a running 3-0 Vicryl suture. Skin was closed with a 4-0 Vicryl subcuticular stitch. Dermabond was then applied. The patient tolerated procedure well and there were no complications. The aspirin sponge and needle count was correct at the end of the case. The patient was taken the recovery room in stable condition. Heparin drip will be restarted in one hour.  Ruta Hinds, MD [facility] [place] Office: [phone] Pager: [phone]

## 2016-08-23 NOTE — H&P (Addendum)
Referring Physician: Tomi Bamberger ER [place]  Patient name: Christine Pugh MRN: [id number] DOB: [birth year] Sex: female  REASON FOR CONSULT: left axillary embolus  HPI: Christine Pugh is a 83 y.o. female with acute onset paralysis left arm about 6 hours ago.  Pt now is slowly getting return of function of left arm but still does not feel totally normal.  History of afib refused anticoagulation in past.  3 falls last 18 months.  Lives fully independent.  Denies prior stroke or MI.  Afib followed by Dr Rayann Heman.  No chest pain or dizziness today.  Work up at [facility] shows left cerebellar stroke subacute, left axillary embolus by head and left arm CT.   Other medical problems include hypertension which has been controlled  Past Medical History:  Diagnosis Date  . Aortic insufficiency    Mild  . Aortic stenosis    Moderate to severe  . Atypical atrial flutter ([place])   . Benign essential HTN   . Hypothyroidism    Past Surgical History:  Procedure Laterality Date  . APPENDECTOMY    . BREAST LUMPECTOMY    . dental implant    . EP IMPLANTABLE DEVICE N/A 01/10/2016   Procedure: Loop Recorder Insertion;  Surgeon: [name], MD;  Location: [facility];  Service: Cardiovascular;  Laterality: N/A;  . THYROIDECTOMY    . TONSILLECTOMY    . TOTAL ABDOMINAL HYSTERECTOMY    . VITRECTOMY AND CATARACT      Family History  Problem Relation Age of Onset  . Coronary artery disease      SOCIAL HISTORY: Social History   Social History  . Marital status: Widowed    Spouse name: N/A  . Number of children: N/A  . Years of education: N/A   Occupational History  . Retired    Social History Main Topics  . Smoking status: Never Smoker  . Smokeless tobacco: Never Used  . Alcohol use No  . Drug use: No  . Sexual activity: Not on file   Other Topics Concern  . Not on file   Social History Narrative   Pt lives in [place] alone.   Widowed   Retired Network engineer    No Known  Allergies  Current Facility-Administered Medications  Medication Dose Route Frequency Provider Last Rate Last Dose  . [START ON 08/24/2016] cefUROXime (ZINACEF) 1.5 g in dextrose 5 % 50 mL IVPB  1.5 g Intravenous On Call to [place], MD      . heparin ADULT infusion 100 units/mL (25000 units/25mL sodium chloride 0.45%)  700 Units/hr Intravenous Continuous Rolland Porter, MD 7 mL/hr at 08/23/16 0619 700 Units/hr at 08/23/16 [id number]   Current Outpatient Prescriptions  Medication Sig Dispense Refill  . amiodarone (PACERONE) 100 MG tablet Take 1 tablet (100 mg total) by mouth daily. (Patient taking differently: Take 50 mg by mouth daily. ) 90 tablet 3  . aspirin EC 81 MG tablet Take 81 mg by mouth daily.    . cyproheptadine (PERIACTIN) 4 MG tablet Take 4 mg by mouth 3 (three) times daily as needed for allergies. Reported on 08/27/2015    . levothyroxine (SYNTHROID, LEVOTHROID) 137 MCG tablet Take 112 mcg by mouth daily before breakfast.     . Lutein-Zeaxanthin 25-5 MG CAPS Take 1 capsule by mouth 2 (two) times daily.    . metoprolol succinate (TOPROL XL) 25 MG 24 hr tablet Take 1 tablet (25 mg total) by mouth 2 (two) times  daily. 180 tablet 3  . Multiple Vitamin (MULTIVITAMIN) tablet Take 1 tablet by mouth daily.      Marland Kitchen triamcinolone cream (KENALOG) 0.1 % Apply 1 application topically daily as needed (irritated skin).     Marland Kitchen triamterene-hydrochlorothiazide (DYAZIDE) 37.5-25 MG per capsule Take 1 capsule by mouth daily.        ROS:   General:  No weight loss, Fever, chills  HEENT: No recent headaches, no nasal bleeding, no visual changes, no sore throat  Neurologic: No dizziness, blackouts, seizures. No recent symptoms of stroke or mini- stroke. No recent episodes of slurred speech, or temporary blindness.  Cardiac: No recent episodes of chest pain/pressure, no shortness of breath at rest.  + shortness of breath with exertion. + history of atrial fibrillation or irregular  heartbeat  Vascular: No history of rest pain in feet.  No history of claudication.  No history of non-healing ulcer, No history of DVT   Pulmonary: No home oxygen, no productive cough, no hemoptysis,  No asthma or wheezing  Musculoskeletal:  [ ]  Arthritis, [X]  Low back pain,  [X]  Joint pain  Hematologic:No history of hypercoagulable state.  No history of easy bleeding.  No history of anemia  Gastrointestinal: No hematochezia or melena,  No gastroesophageal reflux, no trouble swallowing  Urinary: [ ]  chronic Kidney disease, [ ]  on HD - [ ]  MWF or [ ]  TTHS, [ ]  Burning with urination, [ ]  Frequent urination, [ ]  Difficulty urinating;   Skin: No rashes  Psychological: No history of anxiety,  No history of depression   Physical Examination  Vitals:   08/23/16 0500 08/23/16 0530 08/23/16 0542 08/23/16 0600  BP: 117/72 109/74  112/66  Pulse: 66 63 62 61  Resp: 13 17 16 16   Temp:      TempSrc:      SpO2: 90% (!) 86% 96% 95%  Weight:      Height:        Body mass index is 26.57 kg/m.  General:  Alert and oriented, no acute distress HEENT: Normal Neck: No JVD Pulmonary: Clear to auscultation bilaterally Cardiac: Regular Rate and Rhythm  Abdomen: Soft, non-tender, non-distended, no mass Skin: No rash Extremity Pulses:  2+ radial, brachial right side absent brachial radial left,1+ femoral,absent popliteal dorsalis pedis, posterior tibial pulses bilaterally feet pink warm hands fairly symmetric warmth left slightly more pale Musculoskeletal: No deformity or edema  Neurologic: Upper and lower extremity motor 5/5 and symmetric but left hand a little more slow to react  DATA:  CT images head left arm reviewed.  Left distal axillary thrombosis.  Left cerebellar stroke  CBC    Component Value Date/Time   WBC 10.3 08/23/2016 0354   RBC 4.06 08/23/2016 0354   HGB 13.2 08/23/2016 0354   HCT 39.6 08/23/2016 0354   PLT 199 08/23/2016 0354   MCV 97.5 08/23/2016 0354   MCH 32.5  08/23/2016 0354   MCHC 33.3 08/23/2016 0354   RDW 13.9 08/23/2016 0354   LYMPHSABS 0.9 08/23/2016 0354   MONOABS 0.9 08/23/2016 0354   EOSABS 0.1 08/23/2016 0354   BASOSABS 0.0 08/23/2016 0354    BMET    Component Value Date/Time   NA 135 08/23/2016 0354   K 3.2 (L) 08/23/2016 0354   CL 97 (L) 08/23/2016 0354   CO2 25 08/23/2016 0354   GLUCOSE 139 (H) 08/23/2016 0354   BUN 22 (H) 08/23/2016 0354   CREATININE 0.94 08/23/2016 0354   CALCIUM 9.0 08/23/2016 0354  GFRNONAA 51 (L) 08/23/2016 0354   GFRAA 59 (L) 08/23/2016 0354   Troponin negative EKG sinus   ASSESSMENT:  Acute ischemia left arm secondary to afib embolus.  Sub acute left cerebellar stroke again most likely embolic   PLAN:   1. To OR for embolectomy 2. Neuro consult post op for stroke 3.  Will need Dr Rayann Heman to see on Monday to again consider anticoagulation   Ruta Hinds, MD [facility] [place] Office: [phone] Pager: [phone]

## 2016-08-23 NOTE — Progress Notes (Signed)
SLP Cancellation Note  Patient Details Name: Christine Pugh MRN: [id number] DOB: 1922/07/31   Cancelled treatment:        SLP order received. Will f/u pending room assignment.  Deneise Lever, [place] CF-SLP Speech-Language Pathologist [phone]                                                                                                Aliene Altes 08/23/2016, 5:12 PM

## 2016-08-23 NOTE — Progress Notes (Signed)
Called to see pt for decline in neuro status.  She was doing find texting on her phone then became very lethargic now with left arm leg flaccid slurred speech facial droop slurred speech.  Most likely has had an additional embolic event.  Will repeat head CT to make sure she has not had intracranial bleed Heparin is currently off.    Will call Neurology again since they have not yet seen pt and now clear decline in neuro status  Ruta Hinds, MD [facility] [place] Office: [phone] Pager: [phone]

## 2016-08-23 NOTE — ED Triage Notes (Signed)
Pt c/o left arm weakness that she noticed after she had gotten up to use the restroom, pt states that she noticed the arm weakness when she was getting back into bed, pt woke up at 01:37

## 2016-08-23 NOTE — Progress Notes (Signed)
Neuro at bedside.  Looks like new strokes on CT.  Son updated.  Neuro will touch base with him about plan  In light of her age and recent operation as well as unknown time course of event of strokes from prior to transfer would favor not giving tpa  Would like to continue heparin if possible but will leave at discretion of neurology.  Ruta Hinds, MD [facility] [place] Office: [phone] Pager: [phone]

## 2016-08-23 NOTE — ED Provider Notes (Signed)
Patient transferred to the [facility] emergency department for emergent vascular surgery evaluation. The patient started yesterday evening with weakness and pain in the left arm. 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 This was found by CT angiogram by Dr. Tomi Bamberger. Patient was then sent here to see vascular surgery/Dr. fields. Patient resting comfortably in no acute distress. I am unable to palpate an ulnar or radial pulse. The arm does not appear dusky or cool to the touch.   Veryl Speak, MD 08/23/16 [phone]

## 2016-08-23 NOTE — Progress Notes (Signed)
CODE STROKE HEAD CT DONE PRIOR TO EXAM. Dane ER DR IVA KNAPP  [phone] BEEPER WENT OFF [id number], [place] AT [id number].  PATIENT DELAY DUE TO IV ACCESS  SENT TO [place] ,SCANNED AND CALLED RADIOLOGIST ASSISTANT @ [phone]

## 2016-08-23 NOTE — OR Nursing (Signed)
Pt with new Left sided facial droop, Left sided Hemiplegia with neglect and slurred speech.  Oriented to self, time and situation but more lethargic than 1 pm assessment.  Denise with RRT called.  Code stroke called.  Dr. Oneida Alar notified and Dr. Conrad [place] notified.

## 2016-08-23 NOTE — Transfer of Care (Signed)
Immediate Anesthesia Transfer of Care Note  Patient: Christine Pugh  Procedure(s) Performed: Procedure(s): RADIOLOGY WITH ANESTHESIA (Bilateral)  Patient Location: ICU  Anesthesia Type:General  Level of Consciousness: Patient remains intubated per anesthesia plan  Airway & Oxygen Therapy: Patient placed on Ventilator (see vital sign flow sheet for setting)  Post-op Assessment: Report given to RN and Post -op Vital signs reviewed and stable  Post vital signs: Reviewed and stable  Last Vitals:  Vitals:   08/23/16 1545 08/23/16 1600  BP:    Pulse: 71 68  Resp: 19 18  Temp:      Last Pain:  Vitals:   08/23/16 1300  TempSrc:   PainSc: 0-No pain         Complications: No apparent anesthesia complications

## 2016-08-23 NOTE — Anesthesia Preprocedure Evaluation (Addendum)
Anesthesia Evaluation  Patient identified by MRN, date of birth, ID band Patient awake    Reviewed: Allergy & Precautions, NPO status , Patient's Chart, lab work & pertinent test results  Airway Mallampati: II  TM Distance: >3 FB Neck ROM: Full    Dental  (+) Edentulous Upper, Lower Dentures   Pulmonary    Pulmonary exam normal        Cardiovascular hypertension, Pt. on home beta blockers Normal cardiovascular exam+ Valvular Problems/Murmurs AS      Neuro/Psych CVA    GI/Hepatic   Endo/Other  Hypothyroidism   Renal/GU      Musculoskeletal   Abdominal   Peds  Hematology   Anesthesia Other Findings   Reproductive/Obstetrics                            Anesthesia Physical Anesthesia Plan  ASA: III  Anesthesia Plan: MAC   Post-op Pain Management:    Induction: Intravenous  Airway Management Planned: Simple Face Mask  Additional Equipment:   Intra-op Plan:   Post-operative Plan:   Informed Consent: I have reviewed the patients History and Physical, chart, labs and discussed the procedure including the risks, benefits and alternatives for the proposed anesthesia with the patient or authorized representative who has indicated his/her understanding and acceptance.     Plan Discussed with: CRNA and Surgeon  Anesthesia Plan Comments:         Anesthesia Quick Evaluation

## 2016-08-23 NOTE — ED Provider Notes (Signed)
[facility] DEPT Provider Note   CSN: [id number] Arrival date & time: 08/23/16  [id number]  Time seen 04:00 AM   History   Chief Complaint Chief Complaint  Patient presents with  . Extremity Weakness    HPI Christine Pugh is a 83 y.o. female.  HPI  patient states she got up to go to the bathroom at 1:37 AM this morning. Everything was fine but by the time she got back to bed she had acute onset of pain with numbness and weakness in her left upper extremity. She states she is right-handed. She describes a frontal headache all day yesterday which is unusual. She denies chest pain, shortness of breath, visual changes, neck pain, or lower extremity pain, numbness, or weakness. She states this has never happened before.  PCP Gar Ponto, MD Cardiology Dr Domenic Polite and Dr Rayann Heman  Past Medical History:  Diagnosis Date  . Aortic insufficiency    Mild  . Aortic stenosis    Moderate to severe  . Atypical atrial flutter ([place])   . Benign essential HTN   . Hypothyroidism     Patient Active Problem List   Diagnosis Date Noted  . Syncope 01/06/2016  . Atypical atrial flutter ([place]) 10/28/2015  . Essential hypertension 10/28/2015  . Aortic valve disorder 05/19/2009  . SVT/ PSVT/ PAT 05/19/2009    Past Surgical History:  Procedure Laterality Date  . APPENDECTOMY    . BREAST LUMPECTOMY    . dental implant    . EP IMPLANTABLE DEVICE N/A 01/10/2016   Procedure: Loop Recorder Insertion;  Surgeon: [name], MD;  Location: [facility];  Service: Cardiovascular;  Laterality: N/A;  . THYROIDECTOMY    . TONSILLECTOMY    . TOTAL ABDOMINAL HYSTERECTOMY    . VITRECTOMY AND CATARACT      OB History    No data available       Home Medications    Prior to Admission medications   Medication Sig Start Date End Date Taking? Authorizing Provider  amiodarone (PACERONE) 100 MG tablet Take 1 tablet (100 mg total) by mouth daily. Patient taking differently: Take 50 mg by mouth daily.   07/13/16  Yes Satira Sark, MD  aspirin EC 81 MG tablet Take 81 mg by mouth daily.   Yes Historical Provider, MD  cyproheptadine (PERIACTIN) 4 MG tablet Take 4 mg by mouth 3 (three) times daily as needed for allergies. Reported on 08/27/2015   Yes Historical Provider, MD  levothyroxine (SYNTHROID, LEVOTHROID) 137 MCG tablet Take 112 mcg by mouth daily before breakfast.  01/01/16  Yes Historical Provider, MD  Lutein-Zeaxanthin 25-5 MG CAPS Take 1 capsule by mouth 2 (two) times daily.   Yes Historical Provider, MD  metoprolol succinate (TOPROL XL) 25 MG 24 hr tablet Take 1 tablet (25 mg total) by mouth 2 (two) times daily. 07/13/16  Yes Satira Sark, MD  Multiple Vitamin (MULTIVITAMIN) tablet Take 1 tablet by mouth daily.     Yes Historical Provider, MD  triamcinolone cream (KENALOG) 0.1 % Apply 1 application topically daily as needed (irritated skin).  09/07/12  Yes Historical Provider, MD  triamterene-hydrochlorothiazide (DYAZIDE) 37.5-25 MG per capsule Take 1 capsule by mouth daily.     Yes Historical Provider, MD    Family History Family History  Problem Relation Age of Onset  . Coronary artery disease      Social History Social History  Substance Use Topics  . Smoking status: Never Smoker  . Smokeless tobacco: Never  Used  . Alcohol use No  lives at home    Allergies   Patient has no known allergies.   Review of Systems Review of Systems  All other systems reviewed and are negative.    Physical Exam Updated Vital Signs BP 116/66   Pulse 60   Temp 97.7 F (36.5 C) (Oral)   Resp 18   Ht 5\' 3"  (1.6 m)   Wt 150 lb (68 kg)   SpO2 97%   BMI 26.57 kg/m   Vital signs normal    Physical Exam  Constitutional: She is oriented to person, place, and time. She appears well-developed and well-nourished.  Non-toxic appearance. She does not appear ill. No distress.  HENT:  Head: Normocephalic and atraumatic.  Right Ear: External ear normal.  Left Ear: External ear  normal.  Nose: Nose normal. No mucosal edema or rhinorrhea.  Mouth/Throat: Oropharynx is clear and moist and mucous membranes are normal. No dental abscesses or uvula swelling.  Eyes: Conjunctivae and EOM are normal. Pupils are equal, round, and reactive to light.  Neck: Normal range of motion and full passive range of motion without pain. Neck supple.  Cardiovascular: Normal rate, regular rhythm and normal heart sounds.  Exam reveals no gallop and no friction rub.   No murmur heard. Pulmonary/Chest: Effort normal and breath sounds normal. No respiratory distress. She has no wheezes. She has no rhonchi. She has no rales. She exhibits no tenderness and no crepitus.  Abdominal: Soft. Normal appearance and bowel sounds are normal. She exhibits no distension. There is no tenderness. There is no rebound and no guarding.  Musculoskeletal: Normal range of motion. She exhibits no edema or tenderness.  Moves all extremities well.   Neurological: She is alert and oriented to person, place, and time. She has normal strength. No cranial nerve deficit.  Patient has normal strength in her right grip, she has difficulty even grabbing my fingers with her left hand. She is unable to lift her left arm off the bed more than a centimeter. There is no pronator drift on the right. She has no weakness in her left lower extremity.  Patient is noted to have loss of pulses in her left upper extremity. However the temperature of her fingers and extremities feels the same when compared to the right. Her fingers may be a little bit paler on the left compared to the right but they are not dusky or cyanotic.  Skin: Skin is warm, dry and intact. No rash noted. No erythema. No pallor.  Psychiatric: She has a normal mood and affect. Her speech is normal and behavior is normal. Her mood appears not anxious.  Nursing note and vitals reviewed.    ED Treatments / Results  Labs (all labs ordered are listed, but only abnormal  results are displayed)  Results for orders placed or performed during the hospital encounter of 08/23/16  Ethanol  Result Value Ref Range   Alcohol, Ethyl (B) <5 <5 mg/dL  Protime-INR  Result Value Ref Range   Prothrombin Time 13.2 11.4 - 15.2 seconds   INR 1.00   APTT  Result Value Ref Range   aPTT 30 24 - 36 seconds  CBC  Result Value Ref Range   WBC 10.3 4.0 - 10.5 K/uL   RBC 4.06 3.87 - 5.11 MIL/uL   Hemoglobin 13.2 12.0 - 15.0 g/dL   HCT 39.6 36.0 - 46.0 %   MCV 97.5 78.0 - 100.0 fL   MCH 32.5 26.0 -  34.0 pg   MCHC 33.3 30.0 - 36.0 g/dL   RDW 13.9 11.5 - 15.5 %   Platelets 199 150 - 400 K/uL  Differential  Result Value Ref Range   Neutrophils Relative % 81 %   Neutro Abs 8.4 (H) 1.7 - 7.7 K/uL   Lymphocytes Relative 9 %   Lymphs Abs 0.9 0.7 - 4.0 K/uL   Monocytes Relative 9 %   Monocytes Absolute 0.9 0.1 - 1.0 K/uL   Eosinophils Relative 1 %   Eosinophils Absolute 0.1 0.0 - 0.7 K/uL   Basophils Relative 0 %   Basophils Absolute 0.0 0.0 - 0.1 K/uL  Comprehensive metabolic panel  Result Value Ref Range   Sodium 135 135 - 145 mmol/L   Potassium 3.2 (L) 3.5 - 5.1 mmol/L   Chloride 97 (L) 101 - 111 mmol/L   CO2 25 22 - 32 mmol/L   Glucose, Bld 139 (H) 65 - 99 mg/dL   BUN 22 (H) 6 - 20 mg/dL   Creatinine, Ser 0.94 0.44 - 1.00 mg/dL   Calcium 9.0 8.9 - 10.3 mg/dL   Total Protein 6.4 (L) 6.5 - 8.1 g/dL   Albumin 3.6 3.5 - 5.0 g/dL   AST 36 15 - 41 U/L   ALT 25 14 - 54 U/L   Alkaline Phosphatase 76 38 - 126 U/L   Total Bilirubin 1.0 0.3 - 1.2 mg/dL   GFR calc non Af Amer 51 (L) >60 mL/min   GFR calc Af Amer 59 (L) >60 mL/min   Anion gap 13 5 - 15  Troponin I  Result Value Ref Range   Troponin I <0.03 <0.03 ng/mL      Laboratory interpretation all normal except     EKG  EKG Interpretation  Date/Time:  Sunday August 23 2016 03:57:13 EST Ventricular Rate:  61 PR Interval:    QRS Duration: 99 QT Interval:  466 QTC Calculation: 470 R  Axis:   -64 Text Interpretation:  Sinus rhythm Prolonged PR interval Left anterior fascicular block Abnormal R-wave progression, late transition Nonspecific T abnormalities, anterior leads Baseline wander in lead(s) V2 No significant change since last tracing 19 Dec 2015 Confirmed by Sheryl Towell  MD-I, Cinthya Bors ([id number]) on 08/23/2016 4:12:57 AM       Radiology Ct Head Wo Contrast  Result Date: 08/23/2016 CLINICAL DATA:  Code stroke. Left-sided weakness. Absent pulses in left arm. EXAM: CT HEAD WITHOUT CONTRAST TECHNIQUE: Contiguous axial images were obtained from the base of the skull through the vertex without intravenous contrast. COMPARISON:  Head CT 12/19/2015 FINDINGS: Brain: Moderate size subacute infarct in the inferior right cerebellum. No basilar cistern effacement. No additional findings of acute ischemia. Stable atrophy and chronic small vessel ischemia. No intracranial hemorrhage. No hydrocephalus. Vascular: Atherosclerosis of skullbase vasculature without hyperdense vessel or abnormal calcification. Skull: Normal. Negative for fracture or focal lesion. Sinuses/Orbits: Post bilateral cataract resection. Paranasal sinuses otherwise clear. Minimal opacification of lower left mastoid air cells is chronic. Other: None. IMPRESSION: Moderate size subacute infarct in the right inferior cerebellum. Generalized atrophy and chronic small vessel ischemia. These results were called by telephone at the time of interpretation on 08/23/2016 at 5:11 am to Dr. Rolland Porter , who verbally acknowledged these results. Electronically Signed   By: Jeb Levering M.D.   On: 08/23/2016 05:11   Ct Angio Up Extrem Left W &/or Wo Contast  Result Date: 08/23/2016 CLINICAL DATA:  Left arm pain.  Absent left arm poles. EXAM: CT ANGIOGRAPHY UPPER LEFT  EXTREMITY TECHNIQUE: Multidetector CT imaging of the left up was performed using the standard protocol during bolus administration of intravenous contrast. Multiplanar CT image  reconstructions and MIPs were obtained to evaluate the vascular anatomy. CONTRAST:  100 cc Isovue 370 IV COMPARISON:  None. FINDINGS: Abrupt occlusion of the distal left axillary artery of the level of the proximal humerus. Occlusion spans approximately 5 cm, with distal reconstitutional of the brachial artery at the level of the mid proximal humerus. Arterial flow to the elbow is thready with small caliber vessels. Arterial flow is not well visualized distal to the elbow. Review of the MIP images confirms the above findings. IMPRESSION: Occlusion of the distal left axillary artery spanning 5 cm with distal reconstitution of the brachial artery in the mid proximal humerus. Brachial arterial flow to the elbow is thready with small caliber vessels. Flow is not documented distal to the elbow. These results were called by telephone at the time of interpretation on 08/23/2016 at 5:53 am to Dr. Rolland Porter , who verbally acknowledged these results. Electronically Signed   By: Jeb Levering M.D.   On: 08/23/2016 05:54      Results for orders placed or performed during the hospital encounter of 08/23/16  Ethanol  Result Value Ref Range   Alcohol, Ethyl (B) <5 <5 mg/dL  Protime-INR  Result Value Ref Range   Prothrombin Time 13.2 11.4 - 15.2 seconds   INR 1.00   APTT  Result Value Ref Range   aPTT 30 24 - 36 seconds  CBC  Result Value Ref Range   WBC 10.3 4.0 - 10.5 K/uL   RBC 4.06 3.87 - 5.11 MIL/uL   Hemoglobin 13.2 12.0 - 15.0 g/dL   HCT 39.6 36.0 - 46.0 %   MCV 97.5 78.0 - 100.0 fL   MCH 32.5 26.0 - 34.0 pg   MCHC 33.3 30.0 - 36.0 g/dL   RDW 13.9 11.5 - 15.5 %   Platelets 199 150 - 400 K/uL  Differential  Result Value Ref Range   Neutrophils Relative % 81 %   Neutro Abs 8.4 (H) 1.7 - 7.7 K/uL   Lymphocytes Relative 9 %   Lymphs Abs 0.9 0.7 - 4.0 K/uL   Monocytes Relative 9 %   Monocytes Absolute 0.9 0.1 - 1.0 K/uL   Eosinophils Relative 1 %   Eosinophils Absolute 0.1 0.0 - 0.7 K/uL    Basophils Relative 0 %   Basophils Absolute 0.0 0.0 - 0.1 K/uL  Comprehensive metabolic panel  Result Value Ref Range   Sodium 135 135 - 145 mmol/L   Potassium 3.2 (L) 3.5 - 5.1 mmol/L   Chloride 97 (L) 101 - 111 mmol/L   CO2 25 22 - 32 mmol/L   Glucose, Bld 139 (H) 65 - 99 mg/dL   BUN 22 (H) 6 - 20 mg/dL   Creatinine, Ser 0.94 0.44 - 1.00 mg/dL   Calcium 9.0 8.9 - 10.3 mg/dL   Total Protein 6.4 (L) 6.5 - 8.1 g/dL   Albumin 3.6 3.5 - 5.0 g/dL   AST 36 15 - 41 U/L   ALT 25 14 - 54 U/L   Alkaline Phosphatase 76 38 - 126 U/L   Total Bilirubin 1.0 0.3 - 1.2 mg/dL   GFR calc non Af Amer 51 (L) >60 mL/min   GFR calc Af Amer 59 (L) >60 mL/min   Anion gap 13 5 - 15  Troponin I  Result Value Ref Range   Troponin I <0.03 <0.03  ng/mL   Laboratory interpretation all normal except hypokalemia    Procedures Procedures (including critical care time)  CRITICAL CARE Performed by: Haldon Carley L Amil Bouwman Total critical care time: 49 minutes Critical care time was exclusive of separately billable procedures and treating other patients. Critical care was necessary to treat or prevent imminent or life-threatening deterioration. Critical care was time spent personally by me on the following activities: development of treatment plan with patient and/or surrogate as well as nursing, discussions with consultants, evaluation of patient's response to treatment, examination of patient, obtaining history from patient or surrogate, ordering and performing treatments and interventions, ordering and review of laboratory studies, ordering and review of radiographic studies, pulse oximetry and re-evaluation of patient's condition.   Medications Ordered in ED Medications  heparin ADULT infusion 100 units/mL (25000 units/255mL sodium chloride 0.45%) (700 Units/hr Intravenous New Bag/Given 08/23/16 0619)  cefUROXime (ZINACEF) 1.5 g in dextrose 5 % 50 mL IVPB (not administered)  iopamidol (ISOVUE-370) 76 % injection 100 mL  (100 mLs Intravenous Contrast Given 08/23/16 0502)     Initial Impression / Assessment and Plan / ED Course  I have reviewed the triage vital signs and the nursing notes.  Pertinent labs & imaging results that were available during my care of the patient were reviewed by me and considered in my medical decision making (see chart for details).   Code Stroke was called 04:09 AM  05:11 AM radiologist called subacute right cerebellar stroke  05:17 AM patient is finishing her evalution with [facility] neurology. Her LUE weakness is improving, she is now able to make a fist, she can flex her elbow but can't abduct her LUE yet. She states her sister had a drain bleed after TPA. She has decided to not do TPA.  05:31 AM Dr Sande Brothers, neuro, [facility], states to admit for stroke evaluation, he suspects a clot b/o her afib. Put on full dose asprin.   05:44 AM Dr Leonel Ramsay, neurology, feels very atypical for stroke to present with so much pain, agrees with pursuit of vascular problem in her LUE. States she can have a MRI with a loop recorder. Feels needs to be hospitalist admission.   Review of Dr McDowell's notes shows she has refused anticoagulation for her PAF.  05:53 AM radiology called her CTA LUE, patient has acute blockage of distal axillary artery  06:02 AM Dr Oneida Alar, Vascular, accepts in transfer to [facility] ED, wants heparin to be started.  06:06 AM Gerald Stabs, charge nurse at [facility] notified of transfer  06:08 AM Dr Claudine Mouton, [facility] notified of transfer.  Final Clinical Impressions(s) / ED Diagnoses   Final diagnoses:  Left upper arm pain  Arterial occlusion [facility])    Transfer to [facility] ED to see Dr Leeroy Cha, MD, Barbette Or, MD 08/23/16 [phone]

## 2016-08-23 NOTE — Consult Note (Addendum)
Reason for Consult:Code Stroke Referring Physician: Dr. Oneida Alar- vascular surgery  Christine Pugh is an 83 y.o. female.  HPI: Transferred from outside hospital due to subacute right cerebellar infarct on CT early this am and decreased vascular perfusion of the left arm due to brachial artery emboli.  She has paroxysmal a. Fib.  She underwent uneventful embolectomy today and was last seen normal at 1 pm.  At 2 pm, RN saw patient with mental status changes, right gaze deviation, and left hemiparesis.  CT Brain  Shows no hemorrhage, but old left cerebellar infarct and subacute right cerebellar infarct and right MCA clot visible.  I see hypodensity in the right frontal and parietal areas, but radiologist does not confirm that.  CTA Brain and Neck with perfusion studies were ordered in anticipation of mechanical thrombectomy.  She is not an ideal candidate for IV tPA given recent surgery.    Past Medical History:  Diagnosis Date  . Aortic insufficiency    Mild  . Aortic stenosis    Moderate to severe  . Atypical atrial flutter ([place])   . Benign essential HTN   . Hypothyroidism     Past Surgical History:  Procedure Laterality Date  . APPENDECTOMY    . BREAST LUMPECTOMY    . dental implant    . EP IMPLANTABLE DEVICE N/A 01/10/2016   Procedure: Loop Recorder Insertion;  Surgeon: [name], MD;  Location: [facility];  Service: Cardiovascular;  Laterality: N/A;  . THYROIDECTOMY    . TONSILLECTOMY    . TOTAL ABDOMINAL HYSTERECTOMY    . VITRECTOMY AND CATARACT      Family History  Problem Relation Age of Onset  . Coronary artery disease      Social History:  reports that she has never smoked. She has never used smokeless tobacco. She reports that she does not drink alcohol or use drugs.  Allergies: No Known Allergies  Prior to Admission medications   Medication Sig Start Date End Date Taking? Authorizing Provider  amiodarone (PACERONE) 100 MG tablet Take 1 tablet (100 mg total)  by mouth daily. Patient taking differently: Take 50 mg by mouth daily.  07/13/16  Yes Satira Sark, MD  aspirin EC 81 MG tablet Take 81 mg by mouth daily.   Yes Historical Provider, MD  cyproheptadine (PERIACTIN) 4 MG tablet Take 4 mg by mouth 3 (three) times daily as needed for allergies. Reported on 08/27/2015   Yes Historical Provider, MD  levothyroxine (SYNTHROID, LEVOTHROID) 137 MCG tablet Take 112 mcg by mouth daily before breakfast.  01/01/16  Yes Historical Provider, MD  Lutein-Zeaxanthin 25-5 MG CAPS Take 1 capsule by mouth 2 (two) times daily.   Yes Historical Provider, MD  metoprolol succinate (TOPROL XL) 25 MG 24 hr tablet Take 1 tablet (25 mg total) by mouth 2 (two) times daily. 07/13/16  Yes Satira Sark, MD  Multiple Vitamin (MULTIVITAMIN) tablet Take 1 tablet by mouth daily.     Yes Historical Provider, MD  triamcinolone cream (KENALOG) 0.1 % Apply 1 application topically daily as needed (irritated skin).  09/07/12  Yes Historical Provider, MD  triamterene-hydrochlorothiazide (DYAZIDE) 37.5-25 MG per capsule Take 1 capsule by mouth daily.     Yes Historical Provider, MD    Medications:  Prior to Admission:  Prescriptions Prior to Admission  Medication Sig Dispense Refill Last Dose  . amiodarone (PACERONE) 100 MG tablet Take 1 tablet (100 mg total) by mouth daily. (Patient taking differently: Take 50 mg  by mouth daily. ) 90 tablet 3 08/22/2016 at Unknown time  . aspirin EC 81 MG tablet Take 81 mg by mouth daily.   08/22/2016 at Unknown time  . cyproheptadine (PERIACTIN) 4 MG tablet Take 4 mg by mouth 3 (three) times daily as needed for allergies. Reported on 08/27/2015   unk  . levothyroxine (SYNTHROID, LEVOTHROID) 137 MCG tablet Take 112 mcg by mouth daily before breakfast.    08/22/2016 at Unknown time  . Lutein-Zeaxanthin 25-5 MG CAPS Take 1 capsule by mouth 2 (two) times daily.   08/22/2016 at Unknown time  . metoprolol succinate (TOPROL XL) 25 MG 24 hr tablet Take 1 tablet  (25 mg total) by mouth 2 (two) times daily. 180 tablet 3 08/22/2016 at 2100  . Multiple Vitamin (MULTIVITAMIN) tablet Take 1 tablet by mouth daily.     08/22/2016 at Unknown time  . triamcinolone cream (KENALOG) 0.1 % Apply 1 application topically daily as needed (irritated skin).    unk  . triamterene-hydrochlorothiazide (DYAZIDE) 37.5-25 MG per capsule Take 1 capsule by mouth daily.     08/22/2016 at Unknown time    Results for orders placed or performed during the hospital encounter of 08/23/16 (from the past 48 hour(s))  Ethanol     Status: None   Collection Time: 08/23/16  3:54 AM  Result Value Ref Range   Alcohol, Ethyl (B) <5 <5 mg/dL    Comment:        LOWEST DETECTABLE LIMIT FOR SERUM ALCOHOL IS 5 mg/dL FOR MEDICAL PURPOSES ONLY   Protime-INR     Status: None   Collection Time: 08/23/16  3:54 AM  Result Value Ref Range   Prothrombin Time 13.2 11.4 - 15.2 seconds   INR 1.00   APTT     Status: None   Collection Time: 08/23/16  3:54 AM  Result Value Ref Range   aPTT 30 24 - 36 seconds  CBC     Status: None   Collection Time: 08/23/16  3:54 AM  Result Value Ref Range   WBC 10.3 4.0 - 10.5 K/uL   RBC 4.06 3.87 - 5.11 MIL/uL   Hemoglobin 13.2 12.0 - 15.0 g/dL   HCT 39.6 36.0 - 46.0 %   MCV 97.5 78.0 - 100.0 fL   MCH 32.5 26.0 - 34.0 pg   MCHC 33.3 30.0 - 36.0 g/dL   RDW 13.9 11.5 - 15.5 %   Platelets 199 150 - 400 K/uL  Differential     Status: Abnormal   Collection Time: 08/23/16  3:54 AM  Result Value Ref Range   Neutrophils Relative % 81 %   Neutro Abs 8.4 (H) 1.7 - 7.7 K/uL   Lymphocytes Relative 9 %   Lymphs Abs 0.9 0.7 - 4.0 K/uL   Monocytes Relative 9 %   Monocytes Absolute 0.9 0.1 - 1.0 K/uL   Eosinophils Relative 1 %   Eosinophils Absolute 0.1 0.0 - 0.7 K/uL   Basophils Relative 0 %   Basophils Absolute 0.0 0.0 - 0.1 K/uL  Comprehensive metabolic panel     Status: Abnormal   Collection Time: 08/23/16  3:54 AM  Result Value Ref Range   Sodium 135 135 - 145  mmol/L   Potassium 3.2 (L) 3.5 - 5.1 mmol/L   Chloride 97 (L) 101 - 111 mmol/L   CO2 25 22 - 32 mmol/L   Glucose, Bld 139 (H) 65 - 99 mg/dL   BUN 22 (H) 6 - 20 mg/dL  Creatinine, Ser 0.94 0.44 - 1.00 mg/dL   Calcium 9.0 8.9 - 10.3 mg/dL   Total Protein 6.4 (L) 6.5 - 8.1 g/dL   Albumin 3.6 3.5 - 5.0 g/dL   AST 36 15 - 41 U/L   ALT 25 14 - 54 U/L   Alkaline Phosphatase 76 38 - 126 U/L   Total Bilirubin 1.0 0.3 - 1.2 mg/dL   GFR calc non Af Amer 51 (L) >60 mL/min   GFR calc Af Amer 59 (L) >60 mL/min    Comment: (NOTE) The eGFR has been calculated using the CKD EPI equation. This calculation has not been validated in all clinical situations. eGFR's persistently <60 mL/min signify possible Chronic Kidney Disease.    Anion gap 13 5 - 15  Troponin I     Status: None   Collection Time: 08/23/16  3:54 AM  Result Value Ref Range   Troponin I <0.03 <0.03 ng/mL    Ct Head Wo Contrast  Result Date: 08/23/2016 CLINICAL DATA:  Code stroke. Left-sided weakness. Absent pulses in left arm. EXAM: CT HEAD WITHOUT CONTRAST TECHNIQUE: Contiguous axial images were obtained from the base of the skull through the vertex without intravenous contrast. COMPARISON:  Head CT 12/19/2015 FINDINGS: Brain: Moderate size subacute infarct in the inferior right cerebellum. No basilar cistern effacement. No additional findings of acute ischemia. Stable atrophy and chronic small vessel ischemia. No intracranial hemorrhage. No hydrocephalus. Vascular: Atherosclerosis of skullbase vasculature without hyperdense vessel or abnormal calcification. Skull: Normal. Negative for fracture or focal lesion. Sinuses/Orbits: Post bilateral cataract resection. Paranasal sinuses otherwise clear. Minimal opacification of lower left mastoid air cells is chronic. Other: None. IMPRESSION: Moderate size subacute infarct in the right inferior cerebellum. Generalized atrophy and chronic small vessel ischemia. These results were called by  telephone at the time of interpretation on 08/23/2016 at 5:11 am to Dr. Rolland Porter , who verbally acknowledged these results. Electronically Signed   By: Jeb Levering M.D.   On: 08/23/2016 05:11   Ct Angio Up Extrem Left W &/or Wo Contast  Result Date: 08/23/2016 CLINICAL DATA:  Left arm pain.  Absent left arm poles. EXAM: CT ANGIOGRAPHY UPPER LEFT EXTREMITY TECHNIQUE: Multidetector CT imaging of the left up was performed using the standard protocol during bolus administration of intravenous contrast. Multiplanar CT image reconstructions and MIPs were obtained to evaluate the vascular anatomy. CONTRAST:  100 cc Isovue 370 IV COMPARISON:  None. FINDINGS: Abrupt occlusion of the distal left axillary artery of the level of the proximal humerus. Occlusion spans approximately 5 cm, with distal reconstitutional of the brachial artery at the level of the mid proximal humerus. Arterial flow to the elbow is thready with small caliber vessels. Arterial flow is not well visualized distal to the elbow. Review of the MIP images confirms the above findings. IMPRESSION: Occlusion of the distal left axillary artery spanning 5 cm with distal reconstitution of the brachial artery in the mid proximal humerus. Brachial arterial flow to the elbow is thready with small caliber vessels. Flow is not documented distal to the elbow. These results were called by telephone at the time of interpretation on 08/23/2016 at 5:53 am to Dr. Rolland Porter , who verbally acknowledged these results. Electronically Signed   By: Jeb Levering M.D.   On: 08/23/2016 05:54   Ct Head Code Stroke Wo Contrast  Result Date: 08/23/2016 CLINICAL DATA:  Code stroke. Left-sided weakness and left-sided gaze disturbance. EXAM: CT HEAD WITHOUT CONTRAST TECHNIQUE: Contiguous axial images were obtained  from the base of the skull through the vertex without intravenous contrast. COMPARISON:  Earlier same day FINDINGS: Brain: Acute infarction with in the inferior  cerebellum on the right as seen previously. Question infarction in the region of the right thalamus/ cerebral peduncle. Cerebral hemispheres elsewhere show atrophy and chronic small-vessel ischemic change of the white matter. Newly seen hyperdense right MCA not seen earlier. Vascular: Newly seen hyperdense right MCA. There is atherosclerotic calcification of the major vessels at the base of the brain. Skull: Negative Sinuses/Orbits: Negative Other: None ASPECTS ([place] Stroke Program Early CT Score) - Ganglionic level infarction (caudate, lentiform nuclei, internal capsule, insula, M1-M3 cortex): 7 - Supraganglionic infarction (M4-M6 cortex): 3 Total score (0-10 with 10 being normal): 10 IMPRESSION: 1. Re- demonstration of acute infarction at the inferior cerebellum on the right. Question involvement of the right cerebral peduncle and thalamus. Newly seen hyperdense right MCA consistent with embolus to that vessel. No sign of brain infarction in the territory at this time however. Aspects 10. These results were called by telephone at the time of interpretation on 08/23/2016 at 2:39 pm to Dr. Orlena Sheldon , who verbally acknowledged these results. 2. ASPECTS is Electronically Signed   By: Nelson Chimes M.D.   On: 08/23/2016 14:45    ROS Blood pressure 121/73, pulse (!) 53, temperature 97.7 F (36.5 C), resp. rate 11, height _0  (1.6 m), weight 68 kg (150 lb), SpO2 100 %. Neurologic Examination:  Dysarthric, confused. Right gaze preference.  Left minimal movement to pain.  Right 5/5  Assessment/Plan:  Right MCA distribution acute embolic infarct secondary to a. Fib.  Not ideal candidate for IV tPA due to recent surgery.  Awaiting CTA Brain and Neck with perfusion studies to do mechanical thrombectomy.    Rogue Jury, MD 08/23/2016, 2:57 PM   Addendum: CTA Brain shows right MCA clot.  CTP shows CBF<30% vol of 67 ml  And Tmax >6 sec >250 cc, so there is ischemic penumbra that can be saved.  I  spoke to Dr. Earleen Newport and he has agreed to mobilize the cath team to do mechanical thrombectomy.  I kept her off the Heparin gtt in anticipation of the procedure.

## 2016-08-23 NOTE — OR Nursing (Signed)
Pt transported via stretcher to CT.

## 2016-08-23 NOTE — ED Notes (Signed)
Rn unable to palpate left radial or brachial pulse, Dr Tomi Bamberger notified,

## 2016-08-23 NOTE — OR Nursing (Signed)
I obtained Christine Pugh's belongings from security.  Her cell phone, upper and lower dentures and 2 rings were returned to her.  She placed the dentures in and put on her rings.

## 2016-08-23 NOTE — Anesthesia Postprocedure Evaluation (Signed)
Anesthesia Post Note  Patient: Christine Pugh  Procedure(s) Performed: Procedure(s) (LRB): RADIOLOGY WITH ANESTHESIA (Bilateral)  Patient location during evaluation: SICU Anesthesia Type: General Level of consciousness: sedated Pain management: pain level controlled Vital Signs Assessment: post-procedure vital signs reviewed and stable Respiratory status: patient remains intubated per anesthesia plan Cardiovascular status: stable Anesthetic complications: no       Last Vitals:  Vitals:   08/23/16 1600 08/23/16 2045  BP:    Pulse: 68   Resp: 18   Temp:  36.2 C    Last Pain:  Vitals:   08/23/16 2045  TempSrc: Axillary  PainSc:                  Jolean Madariaga DAVID

## 2016-08-23 NOTE — Anesthesia Postprocedure Evaluation (Signed)
Anesthesia Post Note  Patient: Christine Pugh  Procedure(s) Performed: Procedure(s) (LRB): Left Radial, Ulnar, Brachial and Axillary Embolectomy (Left)  Patient location during evaluation: PACU Anesthesia Type: MAC Level of consciousness: awake and alert Pain management: pain level controlled Vital Signs Assessment: post-procedure vital signs reviewed and stable Respiratory status: spontaneous breathing, nonlabored ventilation, respiratory function stable and patient connected to nasal cannula oxygen Cardiovascular status: stable and blood pressure returned to baseline Anesthetic complications: no       Last Vitals:  Vitals:   08/23/16 0600 08/23/16 1056  BP: 112/66 100/60  Pulse: 61 (!) 56  Resp: 16 12  Temp:  36.5 C    Last Pain:  Vitals:   08/23/16 1056  TempSrc:   PainSc: 0-No pain                 Ashdon Gillson DAVID

## 2016-08-23 NOTE — ED Notes (Signed)
Pt hard stick, difficulty in getting second iv for ct angio,

## 2016-08-23 NOTE — Anesthesia Preprocedure Evaluation (Signed)
Anesthesia Evaluation  Patient identified by MRN, date of birth, ID band Patient awake    Reviewed: Allergy & Precautions, NPO status , Patient's Chart, lab work & pertinent test results  Airway Mallampati: I  TM Distance: >3 FB Neck ROM: Full    Dental   Pulmonary    Pulmonary exam normal        Cardiovascular hypertension, Normal cardiovascular exam     Neuro/Psych Pt developed signs of an acute CVA in the PACU CVA    GI/Hepatic   Endo/Other    Renal/GU      Musculoskeletal   Abdominal   Peds  Hematology   Anesthesia Other Findings   Reproductive/Obstetrics                             Anesthesia Physical Anesthesia Plan  ASA: III and emergent  Anesthesia Plan: General   Post-op Pain Management:    Induction: Intravenous  Airway Management Planned: Oral ETT  Additional Equipment:   Intra-op Plan:   Post-operative Plan: Post-operative intubation/ventilation  Informed Consent: I have reviewed the patients History and Physical, chart, labs and discussed the procedure including the risks, benefits and alternatives for the proposed anesthesia with the patient or authorized representative who has indicated his/her understanding and acceptance.     Plan Discussed with: CRNA and Surgeon  Anesthesia Plan Comments:         Anesthesia Quick Evaluation

## 2016-08-23 NOTE — Procedures (Signed)
Neuro-Interventional Radiology Post Cerebral Angiogram Procedure Note  History:  83 yo female admitted to [facility] for acute LUE ischemia, with embolectomy with Dr. Oneida Alar, ~10:30am.    Recognition of acute left sided symptoms and gaze deviation at ~1pm.    Known paroxysmal A-fib.    CT/CTA/CT Perfusion:  Subacute right cerebellar infarction.  Right MCA sign.  ASPECTS ~9.  CTA shows ELVO, right MCA occlusion.    Patient with good baseline status.    Discussed with Dr. Orlena Sheldon of Neurology.    Last Known Well:  [1:00pm, 08/23/2016]   ASPECTS:   9 Anesthesia    GETA Skin Puncture:   16:42 First Pass Date & Time: 18:46, TICI 2a, SOLUMBRA (Solitaire & Penumbra aspiration) Second:    19:02, TICI 2b, SOLUMBRA (Solitaire & Penumbra aspiration) Third:     19:12, TICI 3, SOLUMBRA (Solitaire & Penumbra aspiration) IA tPA:   [No] IA Medication:  [No] Proximal or Distal:  Proximal right MCA occlusion Post TICI Score:  TICI 3, complete  Procedure:  Right CFA access, Left CFA arterial line Right CCA/ICA angiogram SOLUMBRA technique for complete revascularization of right MCA territory, TICI 3 Deployment of Exoseal for RCFA closure.  Findings:  Type 3 arch.  Tortuous R CCA.  Proximal occlusion of the R MCA  After revascularization, there is TICI 3 flow, with restoration of the R MCA territory.   Mild athero at the right and left carotid bulb.    Complications: None  EBL: ~100cc  Recommendations:  NS @125 /hr x 6 hours for renal protection. Heparin ggt at discretion of the neurology team Right Hip straight for 8 hours post. Routine wound care.  Log roll only.  Direct pressure x 20 min on the right puncture site if oozing/bleeding. Then apply sand bag x 4 hours.  SBP in the 120-140 range, cardene ggt per need.   Signed,  Dulcy Fanny. Earleen Newport, DO

## 2016-08-23 NOTE — ED Notes (Signed)
Patient states that she does NOT want TP-A medication.

## 2016-08-23 NOTE — ED Notes (Signed)
Patient has given verbal consent for her son to call and receive information or updates on her condition.  Son is her power of attorney.

## 2016-08-23 NOTE — Significant Event (Signed)
Rapid Response Event Note  Overview:  Called by RN for neuro changes Time Called: 1402 Arrival Time: 1405 Event Type: Neurologic  Initial Focused Assessment:  Called by PACU RN Waunita Schooner, RN for patient noted to have left facial droop, right gaze, slurred speech and left hemiparesis.  Patient had left brachial embolectomy today, LSN 1300.     Interventions:  NIHSS 16, Code stroke activated.  Patient transported to CT Scan.  CTA head and perfusion done, #18g PIV in Right AC placed by Waunita Schooner, RN.  While in CT scan patient became more lethargic and increased slurred speech.  IR contacted   Plan of Care (if not transferred):  Patient transported to back to PACU.    Event Summary:   Patient transported to IR suite at 1600   at      at          [facility]

## 2016-08-23 NOTE — ED Notes (Signed)
Patient NPO at this time.

## 2016-08-23 NOTE — Sedation Documentation (Signed)
Transported to 50M post intervention, Groin, pulses and dressings reviewed with 50M RN.  Thorough report given with anesthesia.  Hand off complete.

## 2016-08-23 NOTE — Progress Notes (Signed)
In PACU awake alert no hematoma 2+ radial pulse.  Hand and arm feel normal again  Neuro consult pending Dr Rayann Heman to see Restart heparin at noon  Ruta Hinds, MD [facility] Office: [phone] Pager: [phone]

## 2016-08-23 NOTE — Anesthesia Procedure Notes (Signed)
Procedure Name: MAC Date/Time: 08/23/2016 9:22 AM Performed by: Suzy Bouchard Pre-anesthesia Checklist: Patient identified, Emergency Drugs available, Suction available, Patient being monitored and Timeout performed Oxygen Delivery Method: Simple face mask

## 2016-08-23 NOTE — Consult Note (Signed)
PULMONARY / CRITICAL CARE MEDICINE   Name: Christine Pugh MRN: [id number] DOB: 07/17/1922    ADMISSION DATE:  08/23/2016 CONSULTATION DATE:  08/23/16   REFERRING MD:  Christine Pugh  CHIEF COMPLAINT:  Left axillary embolus, acute R MCA stroke  HISTORY OF PRESENT ILLNESS:   Christine Pugh is a 36F admitted 08/23/16 with complaints of acute onset left arm paralysis 6 hours prior to presentation with persistent sensory complaints. She has a PMH significant for atrial fibrillation/flutter, not on anticoagulation, moderate aortic stenosis, hypertension and hypothyroidism. Evaluation revealed L axillary embolus and subacute L cerebellar stroke. She was taken urgently to the OR for revascularization. Shortly after the procedure, she was noted to have a change in neuro status with left facial droop, rightward gaze, slurred speech and left hemiparesis. CODE STROKE was initiated. CTA showed a R MCA embolus and she was taken for mechanical thrombectomy by IR. Post-procedure she remains intubated. PCCM is consulted for ventilator management.   Currently she is sedated on propofol and appears comfortable and synchronous with the ventilator.   PAST MEDICAL HISTORY :  She  has a past medical history of Aortic insufficiency; Aortic stenosis; Atypical atrial flutter ([place]); Benign essential HTN; and Hypothyroidism.  PAST SURGICAL HISTORY: She  has a past surgical history that includes Thyroidectomy; Total abdominal hysterectomy; Appendectomy; Tonsillectomy; Vitrectomy and cataract; dental implant; Breast lumpectomy; and Cardiac catheterization (N/A, 01/10/2016).  No Known Allergies  No current facility-administered medications on file prior to encounter.    Current Outpatient Prescriptions on File Prior to Encounter  Medication Sig  . amiodarone (PACERONE) 100 MG tablet Take 1 tablet (100 mg total) by mouth daily. (Patient taking differently: Take 50 mg by mouth daily. )  . aspirin EC 81 MG tablet Take 81 mg by  mouth daily.  . cyproheptadine (PERIACTIN) 4 MG tablet Take 4 mg by mouth 3 (three) times daily as needed for allergies. Reported on 08/27/2015  . levothyroxine (SYNTHROID, LEVOTHROID) 137 MCG tablet Take 112 mcg by mouth daily before breakfast.   . Lutein-Zeaxanthin 25-5 MG CAPS Take 1 capsule by mouth 2 (two) times daily.  . metoprolol succinate (TOPROL XL) 25 MG 24 hr tablet Take 1 tablet (25 mg total) by mouth 2 (two) times daily.  . Multiple Vitamin (MULTIVITAMIN) tablet Take 1 tablet by mouth daily.    Marland Kitchen triamcinolone cream (KENALOG) 0.1 % Apply 1 application topically daily as needed (irritated skin).   Marland Kitchen triamterene-hydrochlorothiazide (DYAZIDE) 37.5-25 MG per capsule Take 1 capsule by mouth daily.      FAMILY HISTORY:  Family History  Problem Relation Age of Onset  . Coronary artery disease     SOCIAL HISTORY: She  reports that she has never smoked. She has never used smokeless tobacco. She reports that she does not drink alcohol or use drugs.  REVIEW OF SYSTEMS:   Unable to obtain 2/2 mental status and intubated state  SUBJECTIVE:    VITAL SIGNS: BP 127/77   Pulse 68   Temp 97.2 F (36.2 C) (Axillary)   Resp 18   Ht 5\' 3"  (1.6 m)   Wt 65.1 kg (143 lb 8.3 oz)   SpO2 98%   BMI 25.42 kg/m   HEMODYNAMICS:    VENTILATOR SETTINGS:    INTAKE / OUTPUT: I/O last 3 completed shifts: In: 1050 [I.V.:1050] Out: 51 [Stool:1; Blood:50]  PHYSICAL EXAMINATION: Physical Exam: Temp:  [97.2 F (36.2 C)-97.7 F (36.5 C)] 97.2 F (36.2 C) (01/21 2045) Pulse Rate:  [48-100] 48 (01/21  2122) Resp:  [11-19] 15 (01/21 2122) BP: (100-127)/(57-77) 127/77 (01/21 1406) SpO2:  [86 %-100 %] 100 % (01/21 2122) FiO2 (%):  [60 %] 60 % (01/21 2122) Weight:  [65.1 kg (143 lb 8.3 oz)-68 kg (150 lb)] 65.1 kg (143 lb 8.3 oz) (01/21 2037)  General Well nourished, well developed, no apparent distress  HEENT No gross abnormalities. OETT and OGT in place.   Pulmonary Clear to auscultation  bilaterally with no wheezes, rales or ronchi. Vent-assisted effort, symmetrical expansion.   Cardiovascular Normal rate, regular rhythm. S1, s2. II/VI SEM best at RUSB w/ radiation to carotids. No r/g. Distal pulses palpable.  Abdomen Soft, non-tender, non-distended, positive bowel sounds, no palpable organomegaly or masses. Normoresonant to percussion.  Musculoskeletal Grossly normal. LUE with antecubital fossa hematoma.   Lymphatics No cervical, supraclavicular or axillary adenopathy.   Neurologic Exam precluded by sedation. No response to noxious stimuli. PERRL.   Skin/Integuement No rash, no cyanosis, no clubbing. LUE hematoma. L groin site C/D/I w/ sheath in place. R groin site C/D/I    LABS:  BMET  Recent Labs Lab 08/23/16 0354  NA 135  K 3.2*  CL 97*  CO2 25  BUN 22*  CREATININE 0.94  GLUCOSE 139*    Electrolytes  Recent Labs Lab 08/23/16 0354  CALCIUM 9.0    CBC  Recent Labs Lab 08/23/16 0354  WBC 10.3  HGB 13.2  HCT 39.6  PLT 199    Coag's  Recent Labs Lab 08/23/16 0354  APTT 30  INR 1.00    Sepsis Markers No results for input(s): LATICACIDVEN, PROCALCITON, O2SATVEN in the last 168 hours.  ABG No results for input(s): PHART, PCO2ART, PO2ART in the last 168 hours.  Liver Enzymes  Recent Labs Lab 08/23/16 0354  AST 36  ALT 25  ALKPHOS 76  BILITOT 1.0  ALBUMIN 3.6    Cardiac Enzymes  Recent Labs Lab 08/23/16 0354  TROPONINI <0.03    Glucose  Recent Labs Lab 08/23/16 2042  GLUCAP 121*    Imaging Ct Angio Head W Or Wo Contrast  Result Date: 08/23/2016 CLINICAL DATA:  Acute onset of left-sided weakness. Abnormal head CT with hyperdense right MCA. EXAM: CT ANGIOGRAPHY HEAD AND NECK TECHNIQUE: Multidetector CT imaging of the head and neck was performed using the standard protocol during bolus administration of intravenous contrast. Multiplanar CT image reconstructions and MIPs were obtained to evaluate the vascular anatomy.  Carotid stenosis measurements (when applicable) are obtained utilizing NASCET criteria, using the distal internal carotid diameter as the denominator. CONTRAST:  90 cc Isovue 370 COMPARISON:  Earlier same day FINDINGS: CTA NECK FINDINGS Aortic arch: Pronounced atherosclerotic change of the aorta. Ascending aorta dilated, up to 4.1 cm in diameter. Branching pattern of the brachiocephalic vessels from the arch is normal without origin stenosis. No evidence of dissection. Right carotid system: Common carotid artery widely patent to the bifurcation region. Atherosclerotic disease at the carotid bifurcation but no stenosis of the ICA compared to the more distal cervical ICA. Left carotid system: Common carotid artery widely patent to the bifurcation. Atherosclerotic disease at the carotid bifurcation. Minimal diameter of the ICA measures 3 mm. Compared to a more distal cervical ICA diameter of 5 mm, this indicates a 40% stenosis. Vertebral arteries: 50% stenosis of the right vertebral artery. Beyond that, the vessel is widely patent through the neck. 50% stenosis at the left vertebral artery. Beyond that, the vessel is widely patent through the neck. Both vertebral arteries are approximately equal in  size. Skeleton: Chronic cervical spondylosis. 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 Presumably incidental in this case. Upper chest: Patchy airspace opacities bilaterally that are most consistent with interstitial and early alveolar edema. Pneumonia not excluded. Review of the MIP images confirms the above findings CTA HEAD FINDINGS Anterior circulation: Both internal carotid artery's widely patent through the skullbase. Carotid siphon atherosclerotic calcification without stenosis greater than 30%. Anterior and middle cerebral vessels on the left are widely patent. Anterior cerebral artery on the right is widely patent. Complete occlusion of the right MCA because of  an embolus at the distal M1 segment. Posterior circulation: Both vertebral arteries widely patent at the foramen magnum. Atherosclerotic disease in the proximal intracranial portion with narrowing estimated at 30% bilaterally. Both vertebral arteries reach the basilar. Flow is present in [place] up bilaterally. Basilar artery shows mild atherosclerotic change but no flow limiting stenosis. Superior cerebellar and posterior cerebral arteries are patent. Patent posterior communicating arteries, larger on the right. Venous sinuses: Patent and normal Anatomic variants: None significant Delayed phase: Not performed Review of the MIP images confirms the above findings IMPRESSION: Atherosclerotic disease at both carotid bifurcations. No stenosis on the right. 40% stenosis of the proximal ICA on the left. Embolic occlusion of the right MCA at the M1 segment. Ectatic ascending aorta, transverse diameter up to 4.1 cm. Ascending aorta not completely imaged. 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 50% stenosis at both vertebral artery origins. These results were called by telephone at the time of interpretation on 08/23/2016 at 3:39 pm to Dr. Orlena Sheldon , who verbally acknowledged these results. Electronically Signed   By: Nelson Chimes M.D.   On: 08/23/2016 15:40   Ct Head Wo Contrast  Result Date: 08/23/2016 CLINICAL DATA:  Stroke. Right MCA occlusion status post revascularization. EXAM: CT HEAD WITHOUT CONTRAST TECHNIQUE: Contiguous axial images were obtained from the base of the skull through the vertex without intravenous contrast. COMPARISON:  Head CT/ CTA/ cerebral perfusion earlier today FINDINGS: Brain: There is residual intravascular contrast from recent angiography. Gyral hyper density in the right insula and right temporal lobe suggests contrast staining from recent angiography although a component of petechial hemorrhage is also possible. There is mild gyral swelling in these regions. A subacute right cerebellar  infarct is unchanged. There is mild cerebral atrophy and moderate chronic small vessel ischemic disease. Question infarction in the right cerebral peduncle and thalamus on the earlier head CT is not confirmed on this examination. There is no midline shift or extra-axial fluid collection. No mass. Vascular: Calcified atherosclerosis at the skullbase. Residual intravascular contrast material. Skull: No fracture or focal osseous lesion. Sinuses/Orbits: Small volume fluid in the sphenoid sinuses. Small left mastoid effusion, chronic in appearance. Prior bilateral cataract extraction. Other: None. IMPRESSION: 1. Contrast staining and mild gyral swelling involving the right insula and temporal lobe in the MCA territory. Superimposed petechial hemorrhage also possible. 2. Subacute right cerebellar infarct. Electronically Signed   By: Logan Bores M.D.   On: 08/23/2016 20:25   Ct Head Wo Contrast  Result Date: 08/23/2016 CLINICAL DATA:  Code stroke. Left-sided weakness. Absent pulses in left arm. EXAM: CT HEAD WITHOUT CONTRAST TECHNIQUE: Contiguous axial images were obtained from the base of the skull through the vertex without intravenous contrast. COMPARISON:  Head CT 12/19/2015 FINDINGS: Brain: Moderate size subacute infarct in the inferior right cerebellum. No basilar cistern effacement. No additional findings of acute ischemia. Stable  atrophy and chronic small vessel ischemia. No intracranial hemorrhage. No hydrocephalus. Vascular: Atherosclerosis of skullbase vasculature without hyperdense vessel or abnormal calcification. Skull: Normal. Negative for fracture or focal lesion. Sinuses/Orbits: Post bilateral cataract resection. Paranasal sinuses otherwise clear. Minimal opacification of lower left mastoid air cells is chronic. Other: None. IMPRESSION: Moderate size subacute infarct in the right inferior cerebellum. Generalized atrophy and chronic small vessel ischemia. These results were called by telephone at the  time of interpretation on 08/23/2016 at 5:11 am to Dr. Rolland Porter , who verbally acknowledged these results. Electronically Signed   By: Jeb Levering M.D.   On: 08/23/2016 05:11   Ct Angio Neck W Or Wo Contrast  Result Date: 08/23/2016 CLINICAL DATA:  Acute onset of left-sided weakness. Abnormal head CT with hyperdense right MCA. EXAM: CT ANGIOGRAPHY HEAD AND NECK TECHNIQUE: Multidetector CT imaging of the head and neck was performed using the standard protocol during bolus administration of intravenous contrast. Multiplanar CT image reconstructions and MIPs were obtained to evaluate the vascular anatomy. Carotid stenosis measurements (when applicable) are obtained utilizing NASCET criteria, using the distal internal carotid diameter as the denominator. CONTRAST:  90 cc Isovue 370 COMPARISON:  Earlier same day FINDINGS: CTA NECK FINDINGS Aortic arch: Pronounced atherosclerotic change of the aorta. Ascending aorta dilated, up to 4.1 cm in diameter. Branching pattern of the brachiocephalic vessels from the arch is normal without origin stenosis. No evidence of dissection. Right carotid system: Common carotid artery widely patent to the bifurcation region. Atherosclerotic disease at the carotid bifurcation but no stenosis of the ICA compared to the more distal cervical ICA. Left carotid system: Common carotid artery widely patent to the bifurcation. Atherosclerotic disease at the carotid bifurcation. Minimal diameter of the ICA measures 3 mm. Compared to a more distal cervical ICA diameter of 5 mm, this indicates a 40% stenosis. Vertebral arteries: 50% stenosis of the right vertebral artery. Beyond that, the vessel is widely patent through the neck. 50% stenosis at the left vertebral artery. Beyond that, the vessel is widely patent through the neck. Both vertebral arteries are approximately equal in size. Skeleton: Chronic cervical spondylosis. Other neck: Enhancing mass in the left upper neck probably  representing a a glomus vagale tumor. Presumably incidental in this case. Upper chest: Patchy airspace opacities bilaterally that are most consistent with interstitial and early alveolar edema. Pneumonia not excluded. Review of the MIP images confirms the above findings CTA HEAD FINDINGS Anterior circulation: Both internal carotid artery's widely patent through the skullbase. Carotid siphon atherosclerotic calcification without stenosis greater than 30%. Anterior and middle cerebral vessels on the left are widely patent. Anterior cerebral artery on the right is widely patent. Complete occlusion of the right MCA because of an embolus at the distal M1 segment. Posterior circulation: Both vertebral arteries widely patent at the foramen magnum. Atherosclerotic disease in the proximal intracranial portion with narrowing estimated at 30% bilaterally. Both vertebral arteries reach the basilar. Flow is present in [place] up bilaterally. Basilar artery shows mild atherosclerotic change but no flow limiting stenosis. Superior cerebellar and posterior cerebral arteries are patent. Patent posterior communicating arteries, larger on the right. Venous sinuses: Patent and normal Anatomic variants: None significant Delayed phase: Not performed Review of the MIP images confirms the above findings IMPRESSION: Atherosclerotic disease at both carotid bifurcations. No stenosis on the right. 40% stenosis of the proximal ICA on the left. Embolic occlusion of the right MCA at the M1 segment. Ectatic ascending aorta, transverse diameter up to 4.1 cm.  Ascending aorta not completely imaged. 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 50% stenosis at both vertebral artery origins. These results were called by telephone at the time of interpretation on 08/23/2016 at 3:39 pm to Dr. Orlena Sheldon , who verbally acknowledged these results. Electronically Signed   By: Nelson Chimes M.D.   On: 08/23/2016 15:40   Ct Angio Up Extrem Left W &/or Wo  Contast  Result Date: 08/23/2016 CLINICAL DATA:  Left arm pain.  Absent left arm poles. EXAM: CT ANGIOGRAPHY UPPER LEFT EXTREMITY TECHNIQUE: Multidetector CT imaging of the left up was performed using the standard protocol during bolus administration of intravenous contrast. Multiplanar CT image reconstructions and MIPs were obtained to evaluate the vascular anatomy. CONTRAST:  100 cc Isovue 370 IV COMPARISON:  None. FINDINGS: Abrupt occlusion of the distal left axillary artery of the level of the proximal humerus. Occlusion spans approximately 5 cm, with distal reconstitutional of the brachial artery at the level of the mid proximal humerus. Arterial flow to the elbow is thready with small caliber vessels. Arterial flow is not well visualized distal to the elbow. Review of the MIP images confirms the above findings. IMPRESSION: Occlusion of the distal left axillary artery spanning 5 cm with distal reconstitution of the brachial artery in the mid proximal humerus. Brachial arterial flow to the elbow is thready with small caliber vessels. Flow is not documented distal to the elbow. These results were called by telephone at the time of interpretation on 08/23/2016 at 5:53 am to Dr. Rolland Porter , who verbally acknowledged these results. Electronically Signed   By: Jeb Levering M.D.   On: 08/23/2016 05:54   Ct Cerebral Perfusion W Contrast  Result Date: 08/23/2016 CLINICAL DATA:  Acute onset of left-sided weakness. EXAM: CT PERFUSION BRAIN TECHNIQUE: Multiphase CT imaging of the brain was performed following IV bolus contrast injection. Subsequent parametric perfusion maps were calculated using RAPID software. CONTRAST:  90 cc Isovue 370 COMPARISON:  CTA same day FINDINGS: CT Brain Perfusion Findings: CBF (<30%) Volume: 86mL Perfusion (Tmax>6.0s) volume: 294ML Mismatch Volume: 269mL Infarction Location:Right MCA territory. CBF less than 30% volume approaches 70 cc, but is primarily within the deep white matter  rather than the cortex. Additionally, there is a fairly large amount of brain with CBF less than 34% and even more less than 38%. Considering CBF less than 38%, volume would be 133 mL. IMPRESSION: Embolic occlusion of the right M1 segment. Central blood flow less than 30% volume of 67 cc. T-max greater than 6 seconds 294 cc. Mismatch volume 227 cc. Confounding factors as discussed in the last paragraph above. Electronically Signed   By: Nelson Chimes M.D.   On: 08/23/2016 15:44   Ct Head Code Stroke Wo Contrast  Result Date: 08/23/2016 CLINICAL DATA:  Code stroke. Left-sided weakness and left-sided gaze disturbance. EXAM: CT HEAD WITHOUT CONTRAST TECHNIQUE: Contiguous axial images were obtained from the base of the skull through the vertex without intravenous contrast. COMPARISON:  Earlier same day FINDINGS: Brain: Acute infarction with in the inferior cerebellum on the right as seen previously. Question infarction in the region of the right thalamus/ cerebral peduncle. Cerebral hemispheres elsewhere show atrophy and chronic small-vessel ischemic change of the white matter. Newly seen hyperdense right MCA not seen earlier. Vascular: Newly seen hyperdense right MCA. There is atherosclerotic calcification of the major vessels at the base of the brain. Skull: Negative Sinuses/Orbits: Negative Other: None ASPECTS ([place] Stroke Program Early CT Score) - Ganglionic  level infarction (caudate, lentiform nuclei, internal capsule, insula, M1-M3 cortex): 7 - Supraganglionic infarction (M4-M6 cortex): 3 Total score (0-10 with 10 being normal): 10 IMPRESSION: 1. Re- demonstration of acute infarction at the inferior cerebellum on the right. Question involvement of the right cerebral peduncle and thalamus. Newly seen hyperdense right MCA consistent with embolus to that vessel. No sign of brain infarction in the territory at this time however. Aspects 10. These results were called by telephone at the time of interpretation on  08/23/2016 at 2:39 pm to Dr. Orlena Sheldon , who verbally acknowledged these results. 2. ASPECTS is Electronically Signed   By: Nelson Chimes M.D.   On: 08/23/2016 14:45     STUDIES:  As above  CULTURES: none  ANTIBIOTICS: Perioperative cefazolin / cefuroxime  SIGNIFICANT EVENTS: Acute stroke (R MCA) post L axillar embolectomy  LINES/TUBES: OETT, OGT, Foley, A-line 08/23/16 >>  DISCUSSION: Ms. Junior is a 54F admitted with acute LUE thrombosis (axillary), subacute cerebellar infarct and acute R MCA occlusion post-procedure, now status post IR mechanical thrombectomy. PMH significant for a fib/flutter not on a/c, aortic stenosis, HTN, hypothyroidism.   ASSESSMENT / PLAN: NEUROLOGIC A:   Acute R MCA occlusion Subacute cerebellar infarct P:   RASS goal: 0 to -1 Wean sedation as tolerated Tight BP control; goal 0000000 systolic - Cardene if needed Frequent neuro checks IR and neurology managing Defer heparin gtt to IR/neurology/vascular surgery  PULMONARY A: Need for intubation 2/2 acute stroke and thrombectomy procedure P:   Wean vent as tolerated SBT when able  CARDIOVASCULAR A:  Acute L axillary embolus s/p revascularization Paroxysmal A fib/flutter Aortic stenosis (mod-severe) Aortic insufficiency (mild) Bradycardia - likely propofol related P:  Pulse checks Hold home metoprolol while brady Hold home amiodarone for now Heparin gtt if okay with all surgical teams BP control as above with goal SBP 120-140  RENAL A:   Hypokalemia P:   Replete K Monitor UOP / lytes / Cr  GASTROINTESTINAL A:   No acute issues P:   pantoprazole  HEMATOLOGIC A:   Embolic events, likely a fib/flutter related P:  Heparin gtt if okay with all surgical teams  INFECTIOUS A:   No acute issues P:     ENDOCRINE A:   Hypothyroidism   P:   Continue home synthroid Glycemic control as needed    FAMILY  - Updates: No family available. Per the record patient is full  code - Inter-disciplinary family meet or Palliative Care meeting due by:  day 7  The patient is critically ill with multiple organ system failure and requires high complexity decision making for assessment and support, frequent evaluation and titration of therapies, advanced monitoring, review of radiographic studies and interpretation of complex data.   Critical Care Time devoted to patient care services, exclusive of separately billable procedures, described in this note is 35 minutes.   Yisroel Ramming, MD Pulmonary and [place] Pager: [phone]  08/23/2016, 8:50 PM

## 2016-08-23 NOTE — OR Nursing (Signed)
Left AC incision site with hematoma.  Dr. Oneida Alar notified and he assessed in the hallway.  Hematoma circled.

## 2016-08-23 NOTE — Transfer of Care (Signed)
Immediate Anesthesia Transfer of Care Note  Patient: Christine Pugh  Procedure(s) Performed: Procedure(s): Left Radial, Ulnar, Brachial and Axillary Embolectomy (Left)  Patient Location: PACU  Anesthesia Type:MAC  Level of Consciousness: awake, alert  and oriented  Airway & Oxygen Therapy: Patient Spontanous Breathing and Patient connected to nasal cannula oxygen  Post-op Assessment: Report given to RN, Post -op Vital signs reviewed and stable, Patient moving all extremities X 4 and Patient able to stick tongue midline  Post vital signs: Reviewed and stable  Last Vitals:  Vitals:   08/23/16 0542 08/23/16 0600  BP:  112/66  Pulse: 62 61  Resp: 16 16  Temp:      Last Pain:  Vitals:   08/23/16 0625  TempSrc:   PainSc: 4          Complications: No apparent anesthesia complications

## 2016-08-23 NOTE — Progress Notes (Signed)
Neuro-Interventional Radiology Pre-procedure Note   83 yo female admitted to [facility] for acute LUE ischemia, with embolectomy with Dr. Oneida Alar, ~10:30am.    Recognition of acute left sided symptoms and gaze deviation at ~1pm.    Known paroxysmal A-fib.    CT/CTA/CT Perfusion:  Subacute right cerebellar infarction.  Right MCA sign.  ASPECTS ~9.  CTA shows ELVO, right MCA occlusion.    Patient with good baseline status.    Discussed with Dr. Orlena Sheldon of Neurology.    Agree that patient is good candidate for thrombectomy.    Risks and Benefits discussed with the patient including, but not limited to bleeding, infection, vascular injury or contrast induced renal failure. All of the patient's questions were answered, patient is agreeable to proceed. Consent signed and in chart.  Discussed with son, Mr Cathren Laine [phone] Signed,  Dulcy Fanny. Earleen Newport, DO

## 2016-08-24 ENCOUNTER — Inpatient Hospital Stay (HOSPITAL_COMMUNITY): Payer: Medicare Other

## 2016-08-24 ENCOUNTER — Encounter (HOSPITAL_COMMUNITY): Payer: Self-pay | Admitting: Vascular Surgery

## 2016-08-24 DIAGNOSIS — I480 Paroxysmal atrial fibrillation: Secondary | ICD-10-CM

## 2016-08-24 DIAGNOSIS — I6601 Occlusion and stenosis of right middle cerebral artery: Secondary | ICD-10-CM

## 2016-08-24 DIAGNOSIS — I998 Other disorder of circulatory system: Secondary | ICD-10-CM

## 2016-08-24 DIAGNOSIS — I6300 Cerebral infarction due to thrombosis of unspecified precerebral artery: Secondary | ICD-10-CM

## 2016-08-24 DIAGNOSIS — I639 Cerebral infarction, unspecified: Secondary | ICD-10-CM

## 2016-08-24 DIAGNOSIS — I4891 Unspecified atrial fibrillation: Secondary | ICD-10-CM

## 2016-08-24 LAB — URINALYSIS, MICROSCOPIC (REFLEX)

## 2016-08-24 LAB — MRSA PCR SCREENING: MRSA BY PCR: NEGATIVE

## 2016-08-24 LAB — BASIC METABOLIC PANEL
ANION GAP: 8 (ref 5–15)
BUN: 8 mg/dL (ref 6–20)
CO2: 23 mmol/L (ref 22–32)
Calcium: 7.8 mg/dL — ABNORMAL LOW (ref 8.9–10.3)
Chloride: 108 mmol/L (ref 101–111)
Creatinine, Ser: 0.71 mg/dL (ref 0.44–1.00)
GFR calc Af Amer: >60 mL/min (ref >60)
GLUCOSE: 144 mg/dL — AB (ref 65–99)
POTASSIUM: 3.8 mmol/L (ref 3.5–5.1)
Sodium: 139 mmol/L (ref 135–145)

## 2016-08-24 LAB — URINALYSIS, ROUTINE W REFLEX MICROSCOPIC
Bilirubin Urine: NEGATIVE
Glucose, UA: NEGATIVE mg/dL
Ketones, ur: NEGATIVE mg/dL
Leukocytes, UA: NEGATIVE
Nitrite: NEGATIVE
Protein, ur: NEGATIVE mg/dL
Specific Gravity, Urine: >1.03 — ABNORMAL HIGH (ref 1.005–1.030)
pH: 5 (ref 5.0–8.0)

## 2016-08-24 LAB — CBC
HEMATOCRIT: 34.2 % — AB (ref 36.0–46.0)
Hemoglobin: 11.2 g/dL — ABNORMAL LOW (ref 12.0–15.0)
MCH: 31.5 pg (ref 26.0–34.0)
MCHC: 32.7 g/dL (ref 30.0–36.0)
MCV: 96.1 fL (ref 78.0–100.0)
PLATELETS: 181 10*3/uL (ref 150–400)
RBC: 3.56 MIL/uL — AB (ref 3.87–5.11)
RDW: 14.1 % (ref 11.5–15.5)
WBC: 12.1 10*3/uL — AB (ref 4.0–10.5)

## 2016-08-24 LAB — RAPID URINE DRUG SCREEN, HOSP PERFORMED
Amphetamines: NOT DETECTED
BENZODIAZEPINES: POSITIVE — AB
Barbiturates: NOT DETECTED
COCAINE: NOT DETECTED
OPIATES: NOT DETECTED
TETRAHYDROCANNABINOL: NOT DETECTED

## 2016-08-24 MED ORDER — AMIODARONE HCL 100 MG PO TABS
50.0000 mg | ORAL_TABLET | Freq: Every day | ORAL | Status: DC
  Administered 2016-08-24 – 2016-08-28 (×5): 50 mg via ORAL
  Filled 2016-08-24 (×5): qty 1

## 2016-08-24 MED ORDER — HEPARIN (PORCINE) IN NACL 100-0.45 UNIT/ML-% IJ SOLN
1100.0000 [IU]/h | INTRAMUSCULAR | Status: DC
  Administered 2016-08-24: 750 [IU]/h via INTRAVENOUS
  Administered 2016-08-25: 850 [IU]/h via INTRAVENOUS
  Administered 2016-08-27: 950 [IU]/h via INTRAVENOUS
  Administered 2016-08-28: 1100 [IU]/h via INTRAVENOUS
  Filled 2016-08-24 (×4): qty 250

## 2016-08-24 NOTE — Progress Notes (Signed)
SLP Cancellation Note  Patient Details Name: Christine Pugh MRN: [id number] DOB: 29-Aug-1921   Cancelled treatment:       Reason Eval/Treat Not Completed: Patient not medically ready. She remains intubated but plan is for possible extubation later today. Will follow for readiness.   Germain Osgood 08/24/2016, 11:15 AM  Germain Osgood, M.A. CCC-SLP [phone]

## 2016-08-24 NOTE — Procedures (Signed)
Extubation Procedure Note  Patient Details:   Name: BRITTINI HAYMER DOB: [birth year] MRN: [id number]   Airway Documentation:     Evaluation  O2 sats: stable throughout Complications: No apparent complications Patient did tolerate procedure well. Bilateral Breath Sounds: Clear, Diminished   Yes, Placed on 4L [place]  SPO2 100%  Gonzella Lex 08/24/2016, 11:31 AM

## 2016-08-24 NOTE — Progress Notes (Signed)
  Echocardiogram 2D Echocardiogram has been performed.  Tresa Res 08/24/2016, 11:49 AM

## 2016-08-24 NOTE — Evaluation (Signed)
Clinical/Bedside Swallow Evaluation Patient Details  Name: Christine Pugh MRN: [id number] Date of Birth: May 25, 1922  Today's Date: 08/24/2016 Time: SLP Start Time (ACUTE ONLY): 1555 SLP Stop Time (ACUTE ONLY): 1607 SLP Time Calculation (min) (ACUTE ONLY): 12 min  Past Medical History:  Past Medical History:  Diagnosis Date  . Aortic insufficiency    Mild  . Aortic stenosis    Moderate to severe  . Atypical atrial flutter ([place])   . Benign essential HTN   . Hypothyroidism    Past Surgical History:  Past Surgical History:  Procedure Laterality Date  . APPENDECTOMY    . BREAST LUMPECTOMY    . dental implant    . EP IMPLANTABLE DEVICE N/A 01/10/2016   Procedure: Loop Recorder Insertion;  Surgeon: [name], MD;  Location: [facility];  Service: Cardiovascular;  Laterality: N/A;  . IR GENERIC HISTORICAL  08/23/2016   IR US GUIDE VASC ACCESS RIGHT 08/23/2016 Corrie Mckusick, DO [facility]  . IR GENERIC HISTORICAL  08/23/2016   IR PERCUTANEOUS ART THROMBECTOMY/INFUSION INTRACRANIAL INC DIAG ANGIO 08/23/2016 Corrie Mckusick, DO [facility]  . IR GENERIC HISTORICAL  08/23/2016   IR ANGIO INTRA EXTRACRAN SEL COM CAROTID INNOMINATE UNI L MOD SED 08/23/2016 Corrie Mckusick, DO [facility]  . THROMBECTOMY BRACHIAL ARTERY Left 08/23/2016   Procedure: Left Radial, Ulnar, Brachial and Axillary Embolectomy;  Surgeon: Elam Dutch, MD;  Location: [facility];  Service: Vascular;  Laterality: Left;  . THYROIDECTOMY    . TONSILLECTOMY    . TOTAL ABDOMINAL HYSTERECTOMY    . VITRECTOMY AND CATARACT     HPI:  64F admitted with acute LUE thrombosis (axillary), subacute cerebellar infarct and acute R MCA occlusion post-procedure, now status post IR mechanical thrombectomy. PMH significant for a fib/flutter not on a/c, aortic stenosis, HTN, hypothyroidism.   Assessment / Plan / Recommendation Clinical Impression  Pt has no overt s/s of aspiration throughout intake, even with large, consecutive straw  sips of thin liquids, but she does have mild dyspnea and occasionaly inhalation post-swallow, concerning for her ability to consistently coordinate breath/swallow. Would recommend starting regular textures and thin liquids, but recommend SLP f/u at least briefly for tolerance.    Aspiration Risk  Mild aspiration risk    Diet Recommendation Regular;Thin liquid   Liquid Administration via: Cup;Straw Medication Administration: Whole meds with liquid Supervision: Patient able to self feed;Intermittent supervision to cue for compensatory strategies Compensations: Slow rate;Small sips/bites Postural Changes: Seated upright at 90 degrees;Remain upright for at least 30 minutes after po intake    Other  Recommendations Oral Care Recommendations: Oral care BID   Follow up Recommendations  (tba)      Frequency and Duration min 1 x/week  1 week       Prognosis Prognosis for Safe Diet Advancement: Good      Swallow Study   General HPI: 64F admitted with acute LUE thrombosis (axillary), subacute cerebellar infarct and acute R MCA occlusion post-procedure, now status post IR mechanical thrombectomy. PMH significant for a fib/flutter not on a/c, aortic stenosis, HTN, hypothyroidism. Type of Study: Bedside Swallow Evaluation Previous Swallow Assessment: none in chart Diet Prior to this Study: NPO Temperature Spikes Noted: No Respiratory Status: Room air History of Recent Intubation: Yes Length of Intubations (days): 1 days Date extubated: 08/24/16 Behavior/Cognition: Alert;Cooperative;Pleasant mood Oral Cavity Assessment: Within Functional Limits Oral Care Completed by SLP: No Oral Cavity - Dentition: Dentures, top;Dentures, bottom Vision: Functional for self-feeding Self-Feeding Abilities: Able to feed self  Patient Positioning: Upright in bed Baseline Vocal Quality: Other (comment) (mildly rough)    Oral/Motor/Sensory Function Overall Oral Motor/Sensory Function: Within functional  limits   Ice Chips Ice chips: Not tested   Thin Liquid Thin Liquid: Impaired Presentation: Cup;Self Fed Pharyngeal  Phase Impairments: Other (comments) (mild dyspnea)    Nectar Thick Nectar Thick Liquid: Not tested   Honey Thick Honey Thick Liquid: Not tested   Puree Puree: Impaired Presentation: Self Fed;Spoon Pharyngeal Phase Impairments: Other (comments) (mild dyspnea)   Solid   GO   Solid: Impaired Presentation: Self Fed Pharyngeal Phase Impairments: Other (comments) (mild dyspnea)        Germain Osgood 08/24/2016,5:01 PM  Germain Osgood, M.A. CCC-SLP [phone]

## 2016-08-24 NOTE — Progress Notes (Signed)
PULMONARY / CRITICAL CARE MEDICINE   Name: Christine Pugh MRN: [id number] DOB: Feb 08, 1922    ADMISSION DATE:  08/23/2016 CONSULTATION DATE:  08/23/16   REFERRING MD:  Pershing Proud  CHIEF COMPLAINT:  Left axillary embolus, acute R MCA stroke  HISTORY OF PRESENT ILLNESS:   Christine Pugh is an 83F admitted 08/23/16 with complaints of acute onset left arm paralysis 6 hours prior to presentation with persistent sensory complaints. She has a PMH significant for atrial fibrillation/flutter, not on anticoagulation, moderate aortic stenosis, hypertension and hypothyroidism. Evaluation revealed L axillary embolus and subacute L cerebellar stroke. She was taken urgently to the OR for revascularization. Shortly after the procedure, she was noted to have a change in neuro status with left facial droop, rightward gaze, slurred speech and left hemiparesis. CODE STROKE was initiated. CTA showed a R MCA embolus and she was taken for mechanical thrombectomy by IR. Post-procedure she remains intubated. PCCM is consulted for ventilator management.   SUBJECTIVE:  No acute events overnight. Remains on vent but is awake and following commands. Asking for tube to be taken out.   VITAL SIGNS: BP (!) 119/59   Pulse (!) 51   Temp 97.8 F (36.6 C) (Axillary)   Resp 17   Ht 5\' 3"  (1.6 m)   Wt 65.1 kg (143 lb 8.3 oz)   SpO2 100%   BMI 25.42 kg/m   HEMODYNAMICS:    VENTILATOR SETTINGS: Vent Mode: PRVC FiO2 (%):  [30 %-60 %] 30 % Set Rate:  [16 bmp] 16 bmp Vt Set:  [420 mL] 420 mL PEEP:  [5 cmH20] 5 cmH20 Plateau Pressure:  [14 cmH20-16 cmH20] 16 cmH20  INTAKE / OUTPUT: I/O last 3 completed shifts: In: 3558.2 [I.V.:3243.2; IV Piggyback:315] Out: [id number] [Urine:450; Stool:1; Blood:150]  PHYSICAL EXAMINATION:  Temp:  [97.2 F (36.2 C)-97.8 F (36.6 C)] 97.8 F (36.6 C) (01/22 0400) Pulse Rate:  [48-100] 51 (01/22 0815) Resp:  [11-19] 17 (01/22 0815) BP: (100-127)/(52-77) 119/59 (01/22 0815) SpO2:  [94 %-100  %] 100 % (01/22 0815) Arterial Line BP: (108-154)/(20-67) 140/54 (01/22 0815) FiO2 (%):  [30 %-60 %] 30 % (01/22 0800) Weight:  [65.1 kg (143 lb 8.3 oz)] 65.1 kg (143 lb 8.3 oz) (01/21 2037)  General:  Elderly female in NAD on vent Neuro:  Spontaneously awake, alert, follows commands. HEENT:  [place]/AT, No JVD noted, PERRL Cardiovascular:  RRR, 3/6 SEM Lungs:  Clear Abdomen:  Soft, non-distended Musculoskeletal:  No acute deformity Skin:  Intact, MMM    LABS:  BMET  Recent Labs Lab 08/23/16 0354 08/24/16 0600  NA 135 139  K 3.2* 3.8  CL 97* 108  CO2 25 23  BUN 22* 8  CREATININE 0.94 0.71  GLUCOSE 139* 144*    Electrolytes  Recent Labs Lab 08/23/16 0354 08/24/16 0600  CALCIUM 9.0 7.8*    CBC  Recent Labs Lab 08/23/16 0354 08/24/16 0600  WBC 10.3 12.1*  HGB 13.2 11.2*  HCT 39.6 34.2*  PLT 199 181    Coag's  Recent Labs Lab 08/23/16 0354  APTT 30  INR 1.00    Sepsis Markers No results for input(s): LATICACIDVEN, PROCALCITON, O2SATVEN in the last 168 hours.  ABG  Recent Labs Lab 08/23/16 2200  PHART 7.485*  PCO2ART 31.6*  PO2ART 108    Liver Enzymes  Recent Labs Lab 08/23/16 0354  AST 36  ALT 25  ALKPHOS 76  BILITOT 1.0  ALBUMIN 3.6    Cardiac Enzymes  Recent Labs Lab 08/23/16  0354  TROPONINI <0.03    Glucose  Recent Labs Lab 08/23/16 2042  GLUCAP 121*    Imaging Ct Angio Head W Or Wo Contrast  Result Date: 08/23/2016 CLINICAL DATA:  Acute onset of left-sided weakness. Abnormal head CT with hyperdense right MCA. EXAM: CT ANGIOGRAPHY HEAD AND NECK TECHNIQUE: Multidetector CT imaging of the head and neck was performed using the standard protocol during bolus administration of intravenous contrast. Multiplanar CT image reconstructions and MIPs were obtained to evaluate the vascular anatomy. Carotid stenosis measurements (when applicable) are obtained utilizing NASCET criteria, using the distal internal carotid diameter as  the denominator. CONTRAST:  90 cc Isovue 370 COMPARISON:  Earlier same day FINDINGS: CTA NECK FINDINGS Aortic arch: Pronounced atherosclerotic change of the aorta. Ascending aorta dilated, up to 4.1 cm in diameter. Branching pattern of the brachiocephalic vessels from the arch is normal without origin stenosis. No evidence of dissection. Right carotid system: Common carotid artery widely patent to the bifurcation region. Atherosclerotic disease at the carotid bifurcation but no stenosis of the ICA compared to the more distal cervical ICA. Left carotid system: Common carotid artery widely patent to the bifurcation. Atherosclerotic disease at the carotid bifurcation. Minimal diameter of the ICA measures 3 mm. Compared to a more distal cervical ICA diameter of 5 mm, this indicates a 40% stenosis. Vertebral arteries: 50% stenosis of the right vertebral artery. Beyond that, the vessel is widely patent through the neck. 50% stenosis at the left vertebral artery. Beyond that, the vessel is widely patent through the neck. Both vertebral arteries are approximately equal in size. Skeleton: Chronic cervical spondylosis. 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 Presumably incidental in this case. Upper chest: Patchy airspace opacities bilaterally that are most consistent with interstitial and early alveolar edema. Pneumonia not excluded. Review of the MIP images confirms the above findings CTA HEAD FINDINGS Anterior circulation: Both internal carotid artery's widely patent through the skullbase. Carotid siphon atherosclerotic calcification without stenosis greater than 30%. Anterior and middle cerebral vessels on the left are widely patent. Anterior cerebral artery on the right is widely patent. Complete occlusion of the right MCA because of an embolus at the distal M1 segment. Posterior circulation: Both vertebral arteries widely patent at the foramen magnum.  Atherosclerotic disease in the proximal intracranial portion with narrowing estimated at 30% bilaterally. Both vertebral arteries reach the basilar. Flow is present in [place] up bilaterally. Basilar artery shows mild atherosclerotic change but no flow limiting stenosis. Superior cerebellar and posterior cerebral arteries are patent. Patent posterior communicating arteries, larger on the right. Venous sinuses: Patent and normal Anatomic variants: None significant Delayed phase: Not performed Review of the MIP images confirms the above findings IMPRESSION: Atherosclerotic disease at both carotid bifurcations. No stenosis on the right. 40% stenosis of the proximal ICA on the left. Embolic occlusion of the right MCA at the M1 segment. Ectatic ascending aorta, transverse diameter up to 4.1 cm. Ascending aorta not completely imaged. 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 50% stenosis at both vertebral artery origins. These results were called by telephone at the time of interpretation on 08/23/2016 at 3:39 pm to Dr. Orlena Sheldon , who verbally acknowledged these results. Electronically Signed   By: Nelson Chimes M.D.   On: 08/23/2016 15:40   Ct Head Wo Contrast  Result Date: 08/23/2016 CLINICAL DATA:  Stroke. Right MCA occlusion status post revascularization. EXAM: CT HEAD WITHOUT CONTRAST TECHNIQUE: Contiguous axial  images were obtained from the base of the skull through the vertex without intravenous contrast. COMPARISON:  Head CT/ CTA/ cerebral perfusion earlier today FINDINGS: Brain: There is residual intravascular contrast from recent angiography. Gyral hyper density in the right insula and right temporal lobe suggests contrast staining from recent angiography although a component of petechial hemorrhage is also possible. There is mild gyral swelling in these regions. A subacute right cerebellar infarct is unchanged. There is mild cerebral atrophy and moderate chronic small vessel ischemic disease. Question infarction  in the right cerebral peduncle and thalamus on the earlier head CT is not confirmed on this examination. There is no midline shift or extra-axial fluid collection. No mass. Vascular: Calcified atherosclerosis at the skullbase. Residual intravascular contrast material. Skull: No fracture or focal osseous lesion. Sinuses/Orbits: Small volume fluid in the sphenoid sinuses. Small left mastoid effusion, chronic in appearance. Prior bilateral cataract extraction. Other: None. IMPRESSION: 1. Contrast staining and mild gyral swelling involving the right insula and temporal lobe in the MCA territory. Superimposed petechial hemorrhage also possible. 2. Subacute right cerebellar infarct. Electronically Signed   By: Logan Bores M.D.   On: 08/23/2016 20:25   Ct Angio Neck W Or Wo Contrast  Result Date: 08/23/2016 CLINICAL DATA:  Acute onset of left-sided weakness. Abnormal head CT with hyperdense right MCA. EXAM: CT ANGIOGRAPHY HEAD AND NECK TECHNIQUE: Multidetector CT imaging of the head and neck was performed using the standard protocol during bolus administration of intravenous contrast. Multiplanar CT image reconstructions and MIPs were obtained to evaluate the vascular anatomy. Carotid stenosis measurements (when applicable) are obtained utilizing NASCET criteria, using the distal internal carotid diameter as the denominator. CONTRAST:  90 cc Isovue 370 COMPARISON:  Earlier same day FINDINGS: CTA NECK FINDINGS Aortic arch: Pronounced atherosclerotic change of the aorta. Ascending aorta dilated, up to 4.1 cm in diameter. Branching pattern of the brachiocephalic vessels from the arch is normal without origin stenosis. No evidence of dissection. Right carotid system: Common carotid artery widely patent to the bifurcation region. Atherosclerotic disease at the carotid bifurcation but no stenosis of the ICA compared to the more distal cervical ICA. Left carotid system: Common carotid artery widely patent to the bifurcation.  Atherosclerotic disease at the carotid bifurcation. Minimal diameter of the ICA measures 3 mm. Compared to a more distal cervical ICA diameter of 5 mm, this indicates a 40% stenosis. Vertebral arteries: 50% stenosis of the right vertebral artery. Beyond that, the vessel is widely patent through the neck. 50% stenosis at the left vertebral artery. Beyond that, the vessel is widely patent through the neck. Both vertebral arteries are approximately equal in size. Skeleton: Chronic cervical spondylosis. 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 Presumably incidental in this case. Upper chest: Patchy airspace opacities bilaterally that are most consistent with interstitial and early alveolar edema. Pneumonia not excluded. Review of the MIP images confirms the above findings CTA HEAD FINDINGS Anterior circulation: Both internal carotid artery's widely patent through the skullbase. Carotid siphon atherosclerotic calcification without stenosis greater than 30%. Anterior and middle cerebral vessels on the left are widely patent. Anterior cerebral artery on the right is widely patent. Complete occlusion of the right MCA because of an embolus at the distal M1 segment. Posterior circulation: Both vertebral arteries widely patent at the foramen magnum. Atherosclerotic disease in the proximal intracranial portion with narrowing estimated at 30% bilaterally. Both vertebral arteries reach the basilar. Flow is present in [place]  up bilaterally. Basilar artery shows mild atherosclerotic change but no flow limiting stenosis. Superior cerebellar and posterior cerebral arteries are patent. Patent posterior communicating arteries, larger on the right. Venous sinuses: Patent and normal Anatomic variants: None significant Delayed phase: Not performed Review of the MIP images confirms the above findings IMPRESSION: Atherosclerotic disease at both carotid bifurcations. No stenosis on the  right. 40% stenosis of the proximal ICA on the left. Embolic occlusion of the right MCA at the M1 segment. Ectatic ascending aorta, transverse diameter up to 4.1 cm. Ascending aorta not completely imaged. 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 50% stenosis at both vertebral artery origins. These results were called by telephone at the time of interpretation on 08/23/2016 at 3:39 pm to Dr. Orlena Sheldon , who verbally acknowledged these results. Electronically Signed   By: Nelson Chimes M.D.   On: 08/23/2016 15:40   Ir US Guide Vasc Access Right  Result Date: 08/23/2016 INDICATION: 83 year old female with emergent large vessel occlusion of right MCA, an acute stroke. Symptoms were recognize approximately 1 p.m. in the PACU, status post embolectomy of acute ischemia of left arm, presumably thromboembolic disease. CT/CTA/perfusion demonstrated left MCA occlusion and a penumbra of approximately 300 cc. She did not receive tPA as she had just had embolectomy. EXAM: IR PERCUTANEOUS ART THORMBECTOMY/INFUSION INTRACRANIAL INCLUDE DIAG ANGIO; IR ULTRASOUND GUIDANCE VASC ACCESS RIGHT; IR ANGIO INTRA EXTRACRAN SEL COM CAROTID INNOMINATE UNI LEFT MOD SED COMPARISON:  CT 08/23/2016 MEDICATIONS: 2.0 g Ancef. The antibiotic was administered within 1 hour of the procedure ANESTHESIA/SEDATION: General endotracheal tube anesthesia provided by the anesthesia team CONTRAST:  170 cc Isovue-300 FLUOROSCOPY TIME:  Fluoroscopy Time: 93 minutes 48 seconds (2,056 mGy). COMPLICATIONS: None TECHNIQUE: Informed written consent was obtained from the patient's family after a thorough discussion of the procedural risks, benefits and alternatives. Specific risks discussed include: Bleeding, infection, contrast reaction, kidney injury/failure, need for further procedure/surgery, arterial injury or dissection, embolization to new territory, intracranial hemorrhage (10-15% risk), neurologic deterioration, cardiopulmonary collapse, death. All questions  were addressed. Maximal Sterile Barrier Technique was utilized including during the procedure including caps, mask, sterile gowns, sterile gloves, sterile drape, hand hygiene and skin antiseptic. A timeout was performed prior to the initiation of the procedure. Ultrasound survey of the right inguinal region was performed with images stored and sent to PACs. 11 blade scalpel was used to make a small incision. A micropuncture needle was used access the right common femoral artery under ultrasound. With excellent arterial blood flow returned, an .018 micro wire was passed through the needle, observed to enter the abdominal aorta under fluoroscopy. The needle was removed, and a micropuncture sheath was placed over the wire. The inner dilator and wire were removed, and an 035 Bentson wire was advanced under fluoroscopy into the abdominal aorta. The sheath was removed and a standard 5 [place] vascular sheath was placed. The dilator was removed and the sheath was flushed. Ultrasound survey of the left inguinal region was then performed with images stored and sent to PACs. A micropuncture needle was used access the left common femoral artery under ultrasound. With excellent arterial blood flow returned, an .018 micro wire was passed through the needle, observed to enter the abdominal aorta under fluoroscopy. The needle was removed, and a micropuncture sheath was placed over the wire. The inner dilator and wire were removed, and an 035 Bentson wire was advanced under fluoroscopy into the abdominal aorta. The sheath was removed and a standard 4 [place]  vascular sheath was placed. The dilator was removed and the sheath was flushed. A 30F JB-1 diagnostic catheter was advanced over the wire to the proximal descending thoracic aorta. Wire was then removed. Double flush of the catheter was performed. JB 1 catheter was used in attempt to access the right innominate artery. Type 3 arch with tortuous vasculature was encountered. Both  roadrunner wire and standard Glidewire were used. JB 1 catheter was then exchanged for a 5 [place] standard Simmons catheter. Catheter was reformed in the aortic arch and used to select the innominate artery. Once the catheter was in the proximal common carotid artery. Angiogram was performed. JB 1 catheter could not be advanced on a standard Glidewire or the roadrunner wire secondary to the tortuosity. Multiple failure with prolapse into the aortic arch was encountered. Catheter was removed. The standard 5 French sheath was then exchanged for a bright tip 8 French 55 cm right common femoral artery sheath. Sheath was advanced over a Bentson wire. Dilator was removed and the sheath was attached to heparinized flush. Attempt to access the right common femoral artery was then performed using a standard 5 French Simmons catheter, a JB 2 catheter, a 5 French Simmons 2 glide cath. Once the glide cath [facility] 2 configuration was in the proximal common carotid artery, an exchange length standard Glidewire was advanced into the internal carotid artery. An exchange was then performed for a JB 1 catheter. This failed, with regression to the aortic arch. Glide catheter Simmons 2 was then again placed, with selection of the right common carotid artery. Exchange length glide was again advanced and exchange was made for a glide cath JB 1. JB 1 catheter was advanced into the distal internal carotid artery at the skullbase. Rosen wire was attempted to pass through the JB 1 catheter. Stiffness of the wire cause prolapse of the JB 1 catheter into the aortic arch. The glide cath [facility] 2 configuration was then again placed selecting the common carotid artery. Exchange length Glidewire was then passed into the distal internal carotid artery under road map angiogram. [place] Fire 2 catheter was then replaced for glide cath JB 1, and we had success advancing exchange length roadrunner wire to the skullbase. JB 1 catheter was then removed and  80 cm Neuron Max was advanced over the roadrunner wire into the right carotid system. The dilator was removed and the sheath was carefully aspirated and flushed with heparinized saline flush. Ace 68 catheter was then passed over the roadrunner wire into the internal carotid artery. Catheter was flushed and flushed with heparinized saline drip. A coaxial Trevo provue microcatheter was passed with a synchro soft wire through the Ace catheter to the skullbase. The microcatheter and micro wire configuration were passed into the proximal M1. During the attempt to advance the aspiration catheter over the microcatheter prolapse into the aortic arch was recognized. This could not be salvaged and the entire coaxial system was removed. The glide cath [facility] 2 configuration was then replaced again with selection of the common carotid artery. Similar stepwise progression into the right common carotid was performed with exchange length Glidewire on road map angiogram, exchanged for JB 1 glide cath, and then exchanged for exchange length roadrunner wire. On this attempt, a 90 cm Neuron Max was advanced into the right carotid system. Again, a 7 catheter was passed through the neuron max into the distal common carotid artery and a coaxial microcatheter and synchro soft were passed to the skullbase. Under roadmap angiogram, microcatheter  system was advanced into the internal carotid artery, to the level of the occlusion. The micro wire was then carefully advanced through the occluded segment. Microcatheter was then push through the occluded segment. The aspiration catheter was advanced through the cavernous segment. Wire was then removed through the microcatheter. Blood was then aspirated through the hub of the microcatheter, and a gentle contrast injection was performed confirming intraluminal position. A rotating hemostatic valve was then attached to the back end of the microcatheter, and a pressurized and heparinized saline bag  was attached to the catheter. 4 x 40 solitaire device was then selected. Back flush was achieved at the rotating hemostatic valve, and then the device was gently advanced through the microcatheter to the distal end. The retriever was then unsheathed by withdrawing the microcatheter under fluoroscopy. Once the retriever was completely unsheathed, control angiogram was performed from the Ace catheter. Constant aspiration (first attempt) was then performed at the ACE 68 as the retriever was gently and slowly withdrawn with fluoroscopic observation. Once the retriever was entirely removed from the system, free aspiration was confirmed at the hub of the Ace catheter, with free blood return confirmed. Rotating hemostatic valve was removed, aspiration was performed at the hub clearing the catheter, and a control angiogram was performed. The microcatheter system was then advanced through the Ace catheter for second attempt after incomplete revascularization from the first. Again under road mapping angiogram, the micro wire and microcatheter were advanced through a partial occlusion of the distal M1 segment, and then the wire was removed. Blood was then aspirated through the hub of the microcatheter, and a gentle contrast injection was performed confirming intraluminal position beyond the occlusion. Rotating hemostatic valve was then attached to the back of the microcatheter, and pressurized and heparinized saline bag was attached. The solitaire device was then readvanced through the microcatheter to the distal and. The retriever was un sheath by withdrawing the microcatheter under fluoroscopy. Control angiogram was then performed from the Ace catheter. Constant aspiration (second attempt) was then performed at the ACE 68 as the retriever was gently and slowly withdrawn with fluoroscopic observation. Once the retriever was entirely removed from the system, free aspiration was confirmed at the hub of the Ace catheter, with  free blood return confirmed. Rotating hemostatic valve was removed, aspiration was performed at the hub clearing the catheter, and a control angiogram was performed. The microcatheter system was then advanced through the Ace catheter for third attempt after incomplete revascularization from the second. Again under road mapping angiogram, the micro wire and microcatheter were used to select an occluded M2 branch to what appear to be the parietal region. Once the catheter was through the site of the occlusion, blood was then aspirated through the hub of the microcatheter, and a gentle contrast injection was performed confirming intraluminal position beyond the occlusion. Rotating hemostatic valve was then attached to the back of the microcatheter, and pressurized and heparinized saline bag was attached. The solitaire device was then readvanced through the microcatheter to the distal end. The retriever was un sheath by withdrawing the microcatheter under fluoroscopy. Control angiogram was then performed from the Ace catheter. Constant aspiration (third attempt) was then performed at the ACE 68 as the retriever was gently and slowly withdrawn with fluoroscopic observation. Once the retriever was entirely removed from the system, free aspiration was confirmed at the hub of the Ace catheter, with free blood return confirmed. Rotating hemostatic valve was removed, aspiration was performed at the hub clearing the  catheter, and a control angiogram was performed. The Neuron Max and the ACE catheter were removed. JB 1 catheter was used in an attempt to select the left common carotid artery. Exchange was then made for standard 5 [place] Simmons 2 configuration catheter. This was used to select the common carotid artery. Angiogram of the left common carotid artery was performed. Catheter was removed. Control angiogram was performed at the right common femoral artery puncture site. The 55 centimeter 8 French sheath was exchanged  for a standard 8 French sheath at the common femoral artery puncture site. Exoseal was then deployed for hemostasis. Patient tolerated the procedure well and remained hemodynamically stable throughout. No complications were encountered and no significant blood loss encountered. FINDINGS: Aortic arch: Type 3 aortic arch with mild atherosclerotic changes. Common origin of the innominate artery and the left common carotid artery. Tortuosity of the innominate artery, right common carotid artery. Right common carotid artery: Severely tortuous common carotid artery. No significant stenosis or occlusion. Mild atherosclerotic changes at the bifurcation. Relatively straight course of the cervical right ICA. Right external carotid artery: Patent with antegrade flow. Right internal carotid artery: Minimal atherosclerotic changes of the intracranial right ICA. Occlusion of the M1 segment. Patent A1 segment with perfusion of the right ACA territory. Fetal type posterior communicating artery. Right ACA: A 1 segment patent. A 2 segment perfuses the right territory. Status post first attempt there is partial reconstitution with TICI 2a flow. Status post second attempt there is further reconstitution with TICI 2b flow. Status post third attempt there is complete reconstitution with TICI 3 flow. Left common carotid artery: Common origin with the innominate artery. Mild atherosclerotic changes at the bifurcation. Left external carotid artery: Patent with antegrade flow. Left internal carotid artery: Normal course caliber and contour of the cervical portion. Vertical and petrous segment patent with normal course caliber contour. Cavernous segment patent. Clinoid segment patent. Antegrade flow of the ophthalmic artery. Ophthalmic segment patent. Terminus patent. Left MCA: M1 segment patent. Insular and opercular segments patent. Unremarkable caliber and course of the cortical segments. Typical arterial, capillary/ parenchymal, and  venous phase. Left ACA: A 1 segment patent. A 2 segment perfuses the right territory. IMPRESSION: Status post cerebral angiogram and complete revascularization (TICI 3) of right MCA occlusion with 3 passes of SOLUMBRA technique, using stentriever technology and local aspiration. Deployment of Exoseal for hemostasis. Signed, Dulcy Fanny. Earleen Newport, DO [facility] Radiology Electronically Signed   By: Corrie Mckusick D.O.   On: 08/23/2016 21:36   Ct Cerebral Perfusion W Contrast  Result Date: 08/23/2016 CLINICAL DATA:  Acute onset of left-sided weakness. EXAM: CT PERFUSION BRAIN TECHNIQUE: Multiphase CT imaging of the brain was performed following IV bolus contrast injection. Subsequent parametric perfusion maps were calculated using RAPID software. CONTRAST:  90 cc Isovue 370 COMPARISON:  CTA same day FINDINGS: CT Brain Perfusion Findings: CBF (<30%) Volume: 67mL Perfusion (Tmax>6.0s) volume: 294ML Mismatch Volume: 272mL Infarction Location:Right MCA territory. CBF less than 30% volume approaches 70 cc, but is primarily within the deep white matter rather than the cortex. Additionally, there is a fairly large amount of brain with CBF less than 34% and even more less than 38%. Considering CBF less than 38%, volume would be 133 mL. IMPRESSION: Embolic occlusion of the right M1 segment. Central blood flow less than 30% volume of 67 cc. T-max greater than 6 seconds 294 cc. Mismatch volume 227 cc. Confounding factors as discussed in the last paragraph above. Electronically Signed  By: Nelson Chimes M.D.   On: 08/23/2016 15:44   Ir Percutaneous Art Thrombectomy/infusion Intracranial Inc Diag Angio  Result Date: 08/23/2016 INDICATION: 83 year old female with emergent large vessel occlusion of right MCA, an acute stroke. Symptoms were recognize approximately 1 p.m. in the PACU, status post embolectomy of acute ischemia of left arm, presumably thromboembolic disease.  CT/CTA/perfusion demonstrated left MCA occlusion and a penumbra of approximately 300 cc. She did not receive tPA as she had just had embolectomy. EXAM: IR PERCUTANEOUS ART THORMBECTOMY/INFUSION INTRACRANIAL INCLUDE DIAG ANGIO; IR ULTRASOUND GUIDANCE VASC ACCESS RIGHT; IR ANGIO INTRA EXTRACRAN SEL COM CAROTID INNOMINATE UNI LEFT MOD SED COMPARISON:  CT 08/23/2016 MEDICATIONS: 2.0 g Ancef. The antibiotic was administered within 1 hour of the procedure ANESTHESIA/SEDATION: General endotracheal tube anesthesia provided by the anesthesia team CONTRAST:  170 cc Isovue-300 FLUOROSCOPY TIME:  Fluoroscopy Time: 93 minutes 48 seconds (2,056 mGy). COMPLICATIONS: None TECHNIQUE: Informed written consent was obtained from the patient's family after a thorough discussion of the procedural risks, benefits and alternatives. Specific risks discussed include: Bleeding, infection, contrast reaction, kidney injury/failure, need for further procedure/surgery, arterial injury or dissection, embolization to new territory, intracranial hemorrhage (10-15% risk), neurologic deterioration, cardiopulmonary collapse, death. All questions were addressed. Maximal Sterile Barrier Technique was utilized including during the procedure including caps, mask, sterile gowns, sterile gloves, sterile drape, hand hygiene and skin antiseptic. A timeout was performed prior to the initiation of the procedure. Ultrasound survey of the right inguinal region was performed with images stored and sent to PACs. 11 blade scalpel was used to make a small incision. A micropuncture needle was used access the right common femoral artery under ultrasound. With excellent arterial blood flow returned, an .018 micro wire was passed through the needle, observed to enter the abdominal aorta under fluoroscopy. The needle was removed, and a micropuncture sheath was placed over the wire. The inner dilator and wire were removed, and an 035 Bentson wire was advanced under  fluoroscopy into the abdominal aorta. The sheath was removed and a standard 5 [place] vascular sheath was placed. The dilator was removed and the sheath was flushed. Ultrasound survey of the left inguinal region was then performed with images stored and sent to PACs. A micropuncture needle was used access the left common femoral artery under ultrasound. With excellent arterial blood flow returned, an .018 micro wire was passed through the needle, observed to enter the abdominal aorta under fluoroscopy. The needle was removed, and a micropuncture sheath was placed over the wire. The inner dilator and wire were removed, and an 035 Bentson wire was advanced under fluoroscopy into the abdominal aorta. The sheath was removed and a standard 4 [place] vascular sheath was placed. The dilator was removed and the sheath was flushed. A 61F JB-1 diagnostic catheter was advanced over the wire to the proximal descending thoracic aorta. Wire was then removed. Double flush of the catheter was performed. JB 1 catheter was used in attempt to access the right innominate artery. Type 3 arch with tortuous vasculature was encountered. Both roadrunner wire and standard Glidewire were used. JB 1 catheter was then exchanged for a 5 [place] standard Simmons catheter. Catheter was reformed in the aortic arch and used to select the innominate artery. Once the catheter was in the proximal common carotid artery. Angiogram was performed. JB 1 catheter could not be advanced on a standard Glidewire or the roadrunner wire secondary to the tortuosity. Multiple failure with prolapse into the aortic arch was encountered. Catheter was  removed. The standard 5 French sheath was then exchanged for a bright tip 8 French 55 cm right common femoral artery sheath. Sheath was advanced over a Bentson wire. Dilator was removed and the sheath was attached to heparinized flush. Attempt to access the right common femoral artery was then performed using a standard 5  French Simmons catheter, a JB 2 catheter, a 5 French Simmons 2 glide cath. Once the glide cath [facility] 2 configuration was in the proximal common carotid artery, an exchange length standard Glidewire was advanced into the internal carotid artery. An exchange was then performed for a JB 1 catheter. This failed, with regression to the aortic arch. Glide catheter Simmons 2 was then again placed, with selection of the right common carotid artery. Exchange length glide was again advanced and exchange was made for a glide cath JB 1. JB 1 catheter was advanced into the distal internal carotid artery at the skullbase. Rosen wire was attempted to pass through the JB 1 catheter. Stiffness of the wire cause prolapse of the JB 1 catheter into the aortic arch. The glide cath [facility] 2 configuration was then again placed selecting the common carotid artery. Exchange length Glidewire was then passed into the distal internal carotid artery under road map angiogram. Rosita Fire 2 catheter was then replaced for glide cath JB 1, and we had success advancing exchange length roadrunner wire to the skullbase. JB 1 catheter was then removed and 80 cm Neuron Max was advanced over the roadrunner wire into the right carotid system. The dilator was removed and the sheath was carefully aspirated and flushed with heparinized saline flush. Ace 68 catheter was then passed over the roadrunner wire into the internal carotid artery. Catheter was flushed and flushed with heparinized saline drip. A coaxial Trevo provue microcatheter was passed with a synchro soft wire through the Ace catheter to the skullbase. The microcatheter and micro wire configuration were passed into the proximal M1. During the attempt to advance the aspiration catheter over the microcatheter prolapse into the aortic arch was recognized. This could not be salvaged and the entire coaxial system was removed. The glide cath [facility] 2 configuration was then replaced again with selection  of the common carotid artery. Similar stepwise progression into the right common carotid was performed with exchange length Glidewire on road map angiogram, exchanged for JB 1 glide cath, and then exchanged for exchange length roadrunner wire. On this attempt, a 90 cm Neuron Max was advanced into the right carotid system. Again, a 41 catheter was passed through the neuron max into the distal common carotid artery and a coaxial microcatheter and synchro soft were passed to the skullbase. Under roadmap angiogram, microcatheter system was advanced into the internal carotid artery, to the level of the occlusion. The micro wire was then carefully advanced through the occluded segment. Microcatheter was then push through the occluded segment. The aspiration catheter was advanced through the cavernous segment. Wire was then removed through the microcatheter. Blood was then aspirated through the hub of the microcatheter, and a gentle contrast injection was performed confirming intraluminal position. A rotating hemostatic valve was then attached to the back end of the microcatheter, and a pressurized and heparinized saline bag was attached to the catheter. 4 x 40 solitaire device was then selected. Back flush was achieved at the rotating hemostatic valve, and then the device was gently advanced through the microcatheter to the distal end. The retriever was then unsheathed by withdrawing the microcatheter under fluoroscopy. Once the retriever  was completely unsheathed, control angiogram was performed from the Ace catheter. Constant aspiration (first attempt) was then performed at the ACE 68 as the retriever was gently and slowly withdrawn with fluoroscopic observation. Once the retriever was entirely removed from the system, free aspiration was confirmed at the hub of the Ace catheter, with free blood return confirmed. Rotating hemostatic valve was removed, aspiration was performed at the hub clearing the catheter, and a  control angiogram was performed. The microcatheter system was then advanced through the Ace catheter for second attempt after incomplete revascularization from the first. Again under road mapping angiogram, the micro wire and microcatheter were advanced through a partial occlusion of the distal M1 segment, and then the wire was removed. Blood was then aspirated through the hub of the microcatheter, and a gentle contrast injection was performed confirming intraluminal position beyond the occlusion. Rotating hemostatic valve was then attached to the back of the microcatheter, and pressurized and heparinized saline bag was attached. The solitaire device was then readvanced through the microcatheter to the distal and. The retriever was un sheath by withdrawing the microcatheter under fluoroscopy. Control angiogram was then performed from the Ace catheter. Constant aspiration (second attempt) was then performed at the ACE 68 as the retriever was gently and slowly withdrawn with fluoroscopic observation. Once the retriever was entirely removed from the system, free aspiration was confirmed at the hub of the Ace catheter, with free blood return confirmed. Rotating hemostatic valve was removed, aspiration was performed at the hub clearing the catheter, and a control angiogram was performed. The microcatheter system was then advanced through the Ace catheter for third attempt after incomplete revascularization from the second. Again under road mapping angiogram, the micro wire and microcatheter were used to select an occluded M2 branch to what appear to be the parietal region. Once the catheter was through the site of the occlusion, blood was then aspirated through the hub of the microcatheter, and a gentle contrast injection was performed confirming intraluminal position beyond the occlusion. Rotating hemostatic valve was then attached to the back of the microcatheter, and pressurized and heparinized saline bag was  attached. The solitaire device was then readvanced through the microcatheter to the distal end. The retriever was un sheath by withdrawing the microcatheter under fluoroscopy. Control angiogram was then performed from the Ace catheter. Constant aspiration (third attempt) was then performed at the ACE 68 as the retriever was gently and slowly withdrawn with fluoroscopic observation. Once the retriever was entirely removed from the system, free aspiration was confirmed at the hub of the Ace catheter, with free blood return confirmed. Rotating hemostatic valve was removed, aspiration was performed at the hub clearing the catheter, and a control angiogram was performed. The Neuron Max and the ACE catheter were removed. JB 1 catheter was used in an attempt to select the left common carotid artery. Exchange was then made for standard 5 [place] Simmons 2 configuration catheter. This was used to select the common carotid artery. Angiogram of the left common carotid artery was performed. Catheter was removed. Control angiogram was performed at the right common femoral artery puncture site. The 55 centimeter 8 French sheath was exchanged for a standard 8 French sheath at the common femoral artery puncture site. Exoseal was then deployed for hemostasis. Patient tolerated the procedure well and remained hemodynamically stable throughout. No complications were encountered and no significant blood loss encountered. FINDINGS: Aortic arch: Type 3 aortic arch with mild atherosclerotic changes. Common origin of the innominate artery  and the left common carotid artery. Tortuosity of the innominate artery, right common carotid artery. Right common carotid artery: Severely tortuous common carotid artery. No significant stenosis or occlusion. Mild atherosclerotic changes at the bifurcation. Relatively straight course of the cervical right ICA. Right external carotid artery: Patent with antegrade flow. Right internal carotid artery:  Minimal atherosclerotic changes of the intracranial right ICA. Occlusion of the M1 segment. Patent A1 segment with perfusion of the right ACA territory. Fetal type posterior communicating artery. Right ACA: A 1 segment patent. A 2 segment perfuses the right territory. Status post first attempt there is partial reconstitution with TICI 2a flow. Status post second attempt there is further reconstitution with TICI 2b flow. Status post third attempt there is complete reconstitution with TICI 3 flow. Left common carotid artery: Common origin with the innominate artery. Mild atherosclerotic changes at the bifurcation. Left external carotid artery: Patent with antegrade flow. Left internal carotid artery: Normal course caliber and contour of the cervical portion. Vertical and petrous segment patent with normal course caliber contour. Cavernous segment patent. Clinoid segment patent. Antegrade flow of the ophthalmic artery. Ophthalmic segment patent. Terminus patent. Left MCA: M1 segment patent. Insular and opercular segments patent. Unremarkable caliber and course of the cortical segments. Typical arterial, capillary/ parenchymal, and venous phase. Left ACA: A 1 segment patent. A 2 segment perfuses the right territory. IMPRESSION: Status post cerebral angiogram and complete revascularization (TICI 3) of right MCA occlusion with 3 passes of SOLUMBRA technique, using stentriever technology and local aspiration. Deployment of Exoseal for hemostasis. Signed, Dulcy Fanny. Earleen Newport, DO [facility] Radiology Electronically Signed   By: Corrie Mckusick D.O.   On: 08/23/2016 21:36   Ct Head Code Stroke Wo Contrast  Result Date: 08/23/2016 CLINICAL DATA:  Code stroke. Left-sided weakness and left-sided gaze disturbance. EXAM: CT HEAD WITHOUT CONTRAST TECHNIQUE: Contiguous axial images were obtained from the base of the skull through the vertex without intravenous contrast. COMPARISON:   Earlier same day FINDINGS: Brain: Acute infarction with in the inferior cerebellum on the right as seen previously. Question infarction in the region of the right thalamus/ cerebral peduncle. Cerebral hemispheres elsewhere show atrophy and chronic small-vessel ischemic change of the white matter. Newly seen hyperdense right MCA not seen earlier. Vascular: Newly seen hyperdense right MCA. There is atherosclerotic calcification of the major vessels at the base of the brain. Skull: Negative Sinuses/Orbits: Negative Other: None ASPECTS ([place] Stroke Program Early CT Score) - Ganglionic level infarction (caudate, lentiform nuclei, internal capsule, insula, M1-M3 cortex): 7 - Supraganglionic infarction (M4-M6 cortex): 3 Total score (0-10 with 10 being normal): 10 IMPRESSION: 1. Re- demonstration of acute infarction at the inferior cerebellum on the right. Question involvement of the right cerebral peduncle and thalamus. Newly seen hyperdense right MCA consistent with embolus to that vessel. No sign of brain infarction in the territory at this time however. Aspects 10. These results were called by telephone at the time of interpretation on 08/23/2016 at 2:39 pm to Dr. Orlena Sheldon , who verbally acknowledged these results. 2. ASPECTS is Electronically Signed   By: Nelson Chimes M.D.   On: 08/23/2016 14:45   Ir Angio Intra Extracran Sel Com Carotid Innominate Uni L Mod Sed  Result Date: 08/23/2016 INDICATION: 83 year old female with emergent large vessel occlusion of right MCA, an acute stroke. Symptoms were recognize approximately 1 p.m. in the PACU, status post embolectomy of acute ischemia of left arm, presumably thromboembolic disease. CT/CTA/perfusion demonstrated left MCA occlusion  and a penumbra of approximately 300 cc. She did not receive tPA as she had just had embolectomy. EXAM: IR PERCUTANEOUS ART THORMBECTOMY/INFUSION INTRACRANIAL INCLUDE DIAG ANGIO; IR ULTRASOUND GUIDANCE VASC ACCESS RIGHT; IR ANGIO INTRA  EXTRACRAN SEL COM CAROTID INNOMINATE UNI LEFT MOD SED COMPARISON:  CT 08/23/2016 MEDICATIONS: 2.0 g Ancef. The antibiotic was administered within 1 hour of the procedure ANESTHESIA/SEDATION: General endotracheal tube anesthesia provided by the anesthesia team CONTRAST:  170 cc Isovue-300 FLUOROSCOPY TIME:  Fluoroscopy Time: 93 minutes 48 seconds (2,056 mGy). COMPLICATIONS: None TECHNIQUE: Informed written consent was obtained from the patient's family after a thorough discussion of the procedural risks, benefits and alternatives. Specific risks discussed include: Bleeding, infection, contrast reaction, kidney injury/failure, need for further procedure/surgery, arterial injury or dissection, embolization to new territory, intracranial hemorrhage (10-15% risk), neurologic deterioration, cardiopulmonary collapse, death. All questions were addressed. Maximal Sterile Barrier Technique was utilized including during the procedure including caps, mask, sterile gowns, sterile gloves, sterile drape, hand hygiene and skin antiseptic. A timeout was performed prior to the initiation of the procedure. Ultrasound survey of the right inguinal region was performed with images stored and sent to PACs. 11 blade scalpel was used to make a small incision. A micropuncture needle was used access the right common femoral artery under ultrasound. With excellent arterial blood flow returned, an .018 micro wire was passed through the needle, observed to enter the abdominal aorta under fluoroscopy. The needle was removed, and a micropuncture sheath was placed over the wire. The inner dilator and wire were removed, and an 035 Bentson wire was advanced under fluoroscopy into the abdominal aorta. The sheath was removed and a standard 5 [place] vascular sheath was placed. The dilator was removed and the sheath was flushed. Ultrasound survey of the left inguinal region was then performed with images stored and sent to PACs. A micropuncture needle  was used access the left common femoral artery under ultrasound. With excellent arterial blood flow returned, an .018 micro wire was passed through the needle, observed to enter the abdominal aorta under fluoroscopy. The needle was removed, and a micropuncture sheath was placed over the wire. The inner dilator and wire were removed, and an 035 Bentson wire was advanced under fluoroscopy into the abdominal aorta. The sheath was removed and a standard 4 [place] vascular sheath was placed. The dilator was removed and the sheath was flushed. A 38F JB-1 diagnostic catheter was advanced over the wire to the proximal descending thoracic aorta. Wire was then removed. Double flush of the catheter was performed. JB 1 catheter was used in attempt to access the right innominate artery. Type 3 arch with tortuous vasculature was encountered. Both roadrunner wire and standard Glidewire were used. JB 1 catheter was then exchanged for a 5 [place] standard Simmons catheter. Catheter was reformed in the aortic arch and used to select the innominate artery. Once the catheter was in the proximal common carotid artery. Angiogram was performed. JB 1 catheter could not be advanced on a standard Glidewire or the roadrunner wire secondary to the tortuosity. Multiple failure with prolapse into the aortic arch was encountered. Catheter was removed. The standard 5 French sheath was then exchanged for a bright tip 8 French 55 cm right common femoral artery sheath. Sheath was advanced over a Bentson wire. Dilator was removed and the sheath was attached to heparinized flush. Attempt to access the right common femoral artery was then performed using a standard 5 French Simmons catheter, a JB 2 catheter, a 5  Rolin Barry 2 glide cath. Once the glide cath [facility] 2 configuration was in the proximal common carotid artery, an exchange length standard Glidewire was advanced into the internal carotid artery. An exchange was then performed for a JB 1  catheter. This failed, with regression to the aortic arch. Glide catheter Simmons 2 was then again placed, with selection of the right common carotid artery. Exchange length glide was again advanced and exchange was made for a glide cath JB 1. JB 1 catheter was advanced into the distal internal carotid artery at the skullbase. Rosen wire was attempted to pass through the JB 1 catheter. Stiffness of the wire cause prolapse of the JB 1 catheter into the aortic arch. The glide cath [facility] 2 configuration was then again placed selecting the common carotid artery. Exchange length Glidewire was then passed into the distal internal carotid artery under road map angiogram. Rosita Fire 2 catheter was then replaced for glide cath JB 1, and we had success advancing exchange length roadrunner wire to the skullbase. JB 1 catheter was then removed and 80 cm Neuron Max was advanced over the roadrunner wire into the right carotid system. The dilator was removed and the sheath was carefully aspirated and flushed with heparinized saline flush. Ace 68 catheter was then passed over the roadrunner wire into the internal carotid artery. Catheter was flushed and flushed with heparinized saline drip. A coaxial Trevo provue microcatheter was passed with a synchro soft wire through the Ace catheter to the skullbase. The microcatheter and micro wire configuration were passed into the proximal M1. During the attempt to advance the aspiration catheter over the microcatheter prolapse into the aortic arch was recognized. This could not be salvaged and the entire coaxial system was removed. The glide cath [place] Day 2 configuration was then replaced again with selection of the common carotid artery. Similar stepwise progression into the right common carotid was performed with exchange length Glidewire on road map angiogram, exchanged for JB 1 glide cath, and then exchanged for exchange length roadrunner wire. On this attempt, a 90 cm Neuron Max was  advanced into the right carotid system. Again, a 51 catheter was passed through the neuron max into the distal common carotid artery and a coaxial microcatheter and synchro soft were passed to the skullbase. Under roadmap angiogram, microcatheter system was advanced into the internal carotid artery, to the level of the occlusion. The micro wire was then carefully advanced through the occluded segment. Microcatheter was then push through the occluded segment. The aspiration catheter was advanced through the cavernous segment. Wire was then removed through the microcatheter. Blood was then aspirated through the hub of the microcatheter, and a gentle contrast injection was performed confirming intraluminal position. A rotating hemostatic valve was then attached to the back end of the microcatheter, and a pressurized and heparinized saline bag was attached to the catheter. 4 x 40 solitaire device was then selected. Back flush was achieved at the rotating hemostatic valve, and then the device was gently advanced through the microcatheter to the distal end. The retriever was then unsheathed by withdrawing the microcatheter under fluoroscopy. Once the retriever was completely unsheathed, control angiogram was performed from the Ace catheter. Constant aspiration (first attempt) was then performed at the ACE 68 as the retriever was gently and slowly withdrawn with fluoroscopic observation. Once the retriever was entirely removed from the system, free aspiration was confirmed at the hub of the Ace catheter, with free blood return confirmed. Rotating hemostatic valve was removed, aspiration  was performed at the hub clearing the catheter, and a control angiogram was performed. The microcatheter system was then advanced through the Ace catheter for second attempt after incomplete revascularization from the first. Again under road mapping angiogram, the micro wire and microcatheter were advanced through a partial occlusion of the  distal M1 segment, and then the wire was removed. Blood was then aspirated through the hub of the microcatheter, and a gentle contrast injection was performed confirming intraluminal position beyond the occlusion. Rotating hemostatic valve was then attached to the back of the microcatheter, and pressurized and heparinized saline bag was attached. The solitaire device was then readvanced through the microcatheter to the distal and. The retriever was un sheath by withdrawing the microcatheter under fluoroscopy. Control angiogram was then performed from the Ace catheter. Constant aspiration (second attempt) was then performed at the ACE 68 as the retriever was gently and slowly withdrawn with fluoroscopic observation. Once the retriever was entirely removed from the system, free aspiration was confirmed at the hub of the Ace catheter, with free blood return confirmed. Rotating hemostatic valve was removed, aspiration was performed at the hub clearing the catheter, and a control angiogram was performed. The microcatheter system was then advanced through the Ace catheter for third attempt after incomplete revascularization from the second. Again under road mapping angiogram, the micro wire and microcatheter were used to select an occluded M2 branch to what appear to be the parietal region. Once the catheter was through the site of the occlusion, blood was then aspirated through the hub of the microcatheter, and a gentle contrast injection was performed confirming intraluminal position beyond the occlusion. Rotating hemostatic valve was then attached to the back of the microcatheter, and pressurized and heparinized saline bag was attached. The solitaire device was then readvanced through the microcatheter to the distal end. The retriever was un sheath by withdrawing the microcatheter under fluoroscopy. Control angiogram was then performed from the Ace catheter. Constant aspiration (third attempt) was then performed at the  ACE 68 as the retriever was gently and slowly withdrawn with fluoroscopic observation. Once the retriever was entirely removed from the system, free aspiration was confirmed at the hub of the Ace catheter, with free blood return confirmed. Rotating hemostatic valve was removed, aspiration was performed at the hub clearing the catheter, and a control angiogram was performed. The Neuron Max and the ACE catheter were removed. JB 1 catheter was used in an attempt to select the left common carotid artery. Exchange was then made for standard 5 [place] Simmons 2 configuration catheter. This was used to select the common carotid artery. Angiogram of the left common carotid artery was performed. Catheter was removed. Control angiogram was performed at the right common femoral artery puncture site. The 55 centimeter 8 French sheath was exchanged for a standard 8 French sheath at the common femoral artery puncture site. Exoseal was then deployed for hemostasis. Patient tolerated the procedure well and remained hemodynamically stable throughout. No complications were encountered and no significant blood loss encountered. FINDINGS: Aortic arch: Type 3 aortic arch with mild atherosclerotic changes. Common origin of the innominate artery and the left common carotid artery. Tortuosity of the innominate artery, right common carotid artery. Right common carotid artery: Severely tortuous common carotid artery. No significant stenosis or occlusion. Mild atherosclerotic changes at the bifurcation. Relatively straight course of the cervical right ICA. Right external carotid artery: Patent with antegrade flow. Right internal carotid artery: Minimal atherosclerotic changes of the intracranial right ICA. Occlusion  of the M1 segment. Patent A1 segment with perfusion of the right ACA territory. Fetal type posterior communicating artery. Right ACA: A 1 segment patent. A 2 segment perfuses the right territory. Status post first attempt there is  partial reconstitution with TICI 2a flow. Status post second attempt there is further reconstitution with TICI 2b flow. Status post third attempt there is complete reconstitution with TICI 3 flow. Left common carotid artery: Common origin with the innominate artery. Mild atherosclerotic changes at the bifurcation. Left external carotid artery: Patent with antegrade flow. Left internal carotid artery: Normal course caliber and contour of the cervical portion. Vertical and petrous segment patent with normal course caliber contour. Cavernous segment patent. Clinoid segment patent. Antegrade flow of the ophthalmic artery. Ophthalmic segment patent. Terminus patent. Left MCA: M1 segment patent. Insular and opercular segments patent. Unremarkable caliber and course of the cortical segments. Typical arterial, capillary/ parenchymal, and venous phase. Left ACA: A 1 segment patent. A 2 segment perfuses the right territory. IMPRESSION: Status post cerebral angiogram and complete revascularization (TICI 3) of right MCA occlusion with 3 passes of SOLUMBRA technique, using stentriever technology and local aspiration. Deployment of Exoseal for hemostasis. Signed, Dulcy Fanny. Earleen Newport, DO [facility] Electronically Signed   By: Corrie Mckusick D.O.   On: 08/23/2016 21:36    STUDIES:  As above  CULTURES: none  ANTIBIOTICS: Perioperative cefazolin / cefuroxime  SIGNIFICANT EVENTS: Acute stroke (R MCA) post L axillar embolectomy 1/21  LINES/TUBES: OETT, OGT, Foley, Fem art sheath 08/23/16 >>  DISCUSSION: Ms. Smaii is a 50F admitted with acute LUE thrombosis (axillary), subacute cerebellar infarct and acute R MCA occlusion post-procedure, now status post IR mechanical thrombectomy. PMH significant for a fib/flutter not on a/c, aortic stenosis, HTN, hypothyroidism.   ASSESSMENT / PLAN: NEUROLOGIC A:   Acute R MCA occlusion Subacute cerebellar infarct P:    RASS goal: 0 Off sedation SBP goal 120-120mmHg per neuro (cardene and phenylephrine as needed) Frequent neuro checks IR and neurology managing Defer heparin gtt to IR/neurology/vascular surgery  PULMONARY A: Need for intubation 2/2 acute stroke and thrombectomy procedure P:   Wean vent as tolerated SBT this able, hopeful for extubation today  CARDIOVASCULAR A:  Acute L axillary embolus s/p revascularization Paroxysmal A fib/flutter Aortic stenosis (mod-severe) Aortic insufficiency (mild) Bradycardia - likely propofol related P:  Pulse checks Hold home metoprolol while brady Hold home amiodarone for now BP control as above  RENAL A:   Hypocalcemia P:   Follow BMP Assess ionized calcium  GASTROINTESTINAL A:   No acute issues P:   Pantoprazole If unable to extubate will start tube feeds  HEMATOLOGIC A:   Embolic events, likely a fib/flutter related P:  Heparin gtt if okay with all surgical teams  INFECTIOUS A:   No acute issues P:     ENDOCRINE A:   Hypothyroidism   P:   Continue home synthroid Glycemic control as needed  FAMILY  - Updates: No family available. Per the record patient is full code - Inter-disciplinary family meet or Palliative Care meeting due by:  1/26  Georgann Housekeeper, AGACNP-BC [place] [facility] Pager [phone] or [phone]  08/24/2016 8:44 AM

## 2016-08-24 NOTE — Progress Notes (Addendum)
  Progress Note    08/24/2016 10:49 AM 1 Day Post-Op  Subjective:  Intubated but awake & alert; follows commands; ready for endotracheal tube to be removed!  Afebrile HR 50's-60's SB/NSR 123XX123 systolic 123456 .30 FiO2  Vitals:   08/24/16 0945 08/24/16 1000  BP: (!) 99/56   Pulse: (!) 57 65  Resp: 15 10  Temp:      Physical Exam: Cardiac:  regular Incisions:  Looks okay; ecchymosis left arm Extremities:  2+ left radial palpable pulse Neuro:  Following commands  CBC    Component Value Date/Time   WBC 12.1 (H) 08/24/2016 0600   RBC 3.56 (L) 08/24/2016 0600   HGB 11.2 (L) 08/24/2016 0600   HCT 34.2 (L) 08/24/2016 0600   PLT 181 08/24/2016 0600   MCV 96.1 08/24/2016 0600   MCH 31.5 08/24/2016 0600   MCHC 32.7 08/24/2016 0600   RDW 14.1 08/24/2016 0600   LYMPHSABS 0.9 08/23/2016 0354   MONOABS 0.9 08/23/2016 0354   EOSABS 0.1 08/23/2016 0354   BASOSABS 0.0 08/23/2016 0354    BMET    Component Value Date/Time   NA 139 08/24/2016 0600   K 3.8 08/24/2016 0600   CL 108 08/24/2016 0600   CO2 23 08/24/2016 0600   GLUCOSE 144 (H) 08/24/2016 0600   BUN 8 08/24/2016 0600   CREATININE 0.71 08/24/2016 0600   CALCIUM 7.8 (L) 08/24/2016 0600   GFRNONAA >60 08/24/2016 0600   GFRAA >60 08/24/2016 0600    INR    Component Value Date/Time   INR 1.00 08/23/2016 0354     Intake/Output Summary (Last 24 hours) at 08/24/16 1049 Last data filed at 08/24/16 0800  Gross per 24 hour  Intake          3413.21 ml  Output              550 ml  Net          2863.21 ml     Assessment:  83 y.o. female is s/p:   Left brachial radial ulnar axillary embolectomy  1 Day Post-Op  Plan: -pt with easily palpable left radial pulse -awake and following commands -hopefully can extubate today-vent per CCM -heparin when okay with IR & neuro   Leontine Locket, PA-C [facility] [phone] 08/24/2016 10:49 AM  Remarkable improvement in neuro status 2+ left  radial pulse.  Small antecubital hematoma Will need anticoagulation as soon as ok with neurology and will need to go home on anticoagulation  Ruta Hinds, MD [facility] [place] Office: [phone] Pager: [phone]

## 2016-08-24 NOTE — Progress Notes (Signed)
Wasted 200cc of fentanyl from fentanyl gtt.down the sink.  Witnessed by Trish Mage RN

## 2016-08-24 NOTE — Progress Notes (Signed)
ANTICOAGULATION CONSULT NOTE - Initial Consult  Pharmacy Consult for heparin Indication: stroke, LUE DVT, s/p embolectomy, hx afib  No Known Allergies  Patient Measurements: Height: 5\' 3"  (160 cm) Weight: 143 lb 8.3 oz (65.1 kg) IBW/kg (Calculated) : 52.4 Heparin Dosing Weight: 65.1 kg  Vital Signs: Temp: 97.7 F (36.5 C) (01/22 1600) Temp Source: Oral (01/22 1600) BP: 101/67 (01/22 1800) Pulse Rate: 70 (01/22 1800)  Labs:  Recent Labs  08/23/16 0354 08/24/16 0600  HGB 13.2 11.2*  HCT 39.6 34.2*  PLT 199 181  APTT 30  --   LABPROT 13.2  --   INR 1.00  --   CREATININE 0.94 0.71  TROPONINI <0.03  --     Estimated Creatinine Clearance (by C-G formula based on SCr of 0.71 mg/dL) Female: 39.9 mL/min Female: 46.4 mL/min   Medical History: Past Medical History:  Diagnosis Date  . Aortic insufficiency    Mild  . Aortic stenosis    Moderate to severe  . Atypical atrial flutter ([place])   . Benign essential HTN   . Hypothyroidism     Medications:  Infusions:  . sodium chloride 75 mL/hr at 08/24/16 0700  . fentaNYL infusion INTRAVENOUS Stopped (08/24/16 [id number])  . niCARDipine Stopped (08/23/16 2300)  . phenylephrine (NEO-SYNEPHRINE) Adult infusion Stopped (08/24/16 [id number])    Assessment: 83 yo female s/p CVA, s/p embolectomy of LUE, and hx afib not on anticoagulation PTA.  Pharmacy asked to begin IV heparin.  No bleeding noted, baseline CBC okay.  Goal of Therapy:  Heparin level 0.3-0.5 units/ml Monitor platelets by anticoagulation protocol: Yes   Plan:  1. Start IV heparin at 750 units/hr.  No bolus. 2. Check heparin level in 8 hrs. 3. Daily heparin level and CBC. 4. F/u plans for oral anticoagulation when able.  Uvaldo Rising, BCPS  Clinical Pharmacist Pager [phone]  08/24/2016 7:22 PM

## 2016-08-24 NOTE — Progress Notes (Signed)
STROKE TEAM PROGRESS NOTE   HISTORY OF PRESENT ILLNESS (per record) Christine RABALAIS is a 83 y.o. female. transferred from outside hospital due to subacute right cerebellar infarct on CT early this am and decreased vascular perfusion of the left arm due to brachial artery emboli.  She has paroxysmal a. Fib.  She underwent uneventful embolectomy today and was last seen normal at 1 pm.  At 2 pm, RN saw patient with mental status changes, right gaze deviation, and left hemiparesis.  CT Brain  Shows no hemorrhage, but old left cerebellar infarct and subacute right cerebellar infarct and right MCA clot visible.  I see hypodensity in the right frontal and parietal areas, but radiologist does not confirm that.  CTA Brain and Neck with perfusion studies were ordered in anticipation of mechanical thrombectomy.  She is not an ideal candidate for IV tPA given recent surgery.     SUBJECTIVE (INTERVAL HISTORY) No family members present. The patient is intubated but alert and able to follow commands. No focal deficits noted except for a left visual field cut.Blood pressure adequately controlled.   OBJECTIVE Temp:  [97.2 F (36.2 C)-97.8 F (36.6 C)] 97.8 F (36.6 C) (01/22 0400) Pulse Rate:  [48-100] 51 (01/22 0815) Cardiac Rhythm: Sinus bradycardia (01/21 2030) Resp:  [11-19] 17 (01/22 0815) BP: (100-127)/(52-77) 119/59 (01/22 0815) SpO2:  [94 %-100 %] 100 % (01/22 0815) Arterial Line BP: (108-154)/(20-67) 140/54 (01/22 0815) FiO2 (%):  [30 %-60 %] 30 % (01/22 0800) Weight:  [65.1 kg (143 lb 8.3 oz)] 65.1 kg (143 lb 8.3 oz) (01/21 2037)  CBC:   Recent Labs Lab 08/23/16 0354 08/24/16 0600  WBC 10.3 12.1*  NEUTROABS 8.4*  --   HGB 13.2 11.2*  HCT 39.6 34.2*  MCV 97.5 96.1  PLT 199 0000000    Basic Metabolic Panel:   Recent Labs Lab 08/23/16 0354 08/24/16 0600  NA 135 139  K 3.2* 3.8  CL 97* 108  CO2 25 23  GLUCOSE 139* 144*  BUN 22* 8  CREATININE 0.94 0.71  CALCIUM 9.0 7.8*    Lipid  Panel: No results found for: CHOL, TRIG, HDL, CHOLHDL, VLDL, LDLCALC HgbA1c: No results found for: HGBA1C Urine Drug Screen: No results found for: LABOPIA, COCAINSCRNUR, LABBENZ, AMPHETMU, THCU, LABBARB    IMAGING  Ct Angio Head and Neck W Or Wo Contrast 08/23/2016 Atherosclerotic disease at both carotid bifurcations. No stenosis on the right. 40% stenosis of the proximal ICA on the left. Embolic occlusion of the right MCA at the M1 segment.  Ectatic ascending aorta, transverse diameter up to 4.1 cm.  Ascending aorta not completely imaged. 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  50% stenosis at both vertebral artery origins.    Ct Head Wo Contrast 08/23/2016 1. Contrast staining and mild gyral swelling involving the right insula and temporal lobe in the MCA territory.  Superimposed petechial hemorrhage also possible.  2. Subacute right cerebellar infarct.     Ct Head Wo Contrast 08/23/2016 Moderate size subacute infarct in the right inferior cerebellum.  Generalized atrophy and chronic small vessel ischemia.      Ct Angio Up Extrem Left W &/or Wo Contast 08/23/2016 Occlusion of the distal left axillary artery spanning 5 cm with distal reconstitution of the brachial artery in the mid proximal humerus.  Brachial arterial flow to the elbow is thready with small caliber vessels.      Ct Cerebral Perfusion W Contrast [id number] Embolic occlusion of the right  M1 segment. Central blood flow less than 30% volume of 67 cc. T-max greater than 6 seconds 294 cc. Mismatch volume 227 cc. Confounding factors as discussed in the last paragraph above.     Ir Percutaneous Art Thrombectomy/infusion Intracranial Inc Diag Angio 08/23/2016 Status post cerebral angiogram and complete revascularization (TICI 3) of right MCA occlusion with 3 passes of SOLUMBRA technique, using stentriever technology and local aspiration. Deployment of Exoseal for hemostasis.     Ct Head Code Stroke Wo  Contrast 08/23/2016 Re- demonstration of acute infarction at the inferior cerebellum on the right.  Question involvement of the right cerebral peduncle and thalamus.  Newly seen hyperdense right MCA consistent with embolus to that vessel.  No sign of brain infarction in the territory at this time however.  Aspects 10.     PHYSICAL EXAM Frail elderly Caucasian lady who is intubated and not sedated. She has bilateral groin arterial sheaths. . Afebrile. Head is nontraumatic. Neck is supple without bruit.    Cardiac exam no murmur or gallop. Lungs are clear to auscultation. Distal pulses are well felt.  Neurological Exam : Intubated but not sedated. Awake alert and following commands quite well. Extraocular movements are full range without nystagmus. Decreased blink to threat on the left compared to the right. Pupils equal reactive. Fundi not visualized. No facial weakness. Tongue midline. Able to move all 4 extremities against gravity. Left lower extremity strength testing is limited due to arterial groin sheath. Plantars bilateral downgoing. Sensation appears preserved bilaterally. Gait was not tested.    ASSESSMENT/PLAN Christine Pugh is a 83 y.o. female with history of atrial fibrillation (not anticoagulated), hypertension, and aortic valvular disease presenting with right gaze deviation and left hemiparesis. She did not receive IV t-PA due to a recent embolectomy.  Stroke:  Non-dominant  Infarct - embolic secondary to atrial fibrillation.  Resultant  Mild left homonymous hemianopia  MRI - not performed  MRA - not performed  CTA H&N - Embolic occlusion of the right MCA at the M1 segment.   CT Head - acute infarction at the inferior cerebellum on the right  Carotid Doppler - CTA neck  2D Echo - pending  LDL - will order  HgbA1c - will order  VTE prophylaxis - SCDs Diet NPO time specified  aspirin 81 mg daily prior to admission, now on aspirin 81 mg daily  Patient will  be counseled to be compliant with her antithrombotic medications  Ongoing aggressive stroke risk factor management  Therapy recommendations: pending  Disposition:  Pending  Hypertension  Blood pressure tends to run low  Permissive hypertension (OK if < 220/120) but gradually normalize in 5-7 days  Long-term BP goal normotensive  Hyperlipidemia  Home meds: No blood pressure lowering medications prior to admission.  LDL pending, goal < 70   Other Stroke Risk Factors  Advanced age  Atrial fibrillation not anticoagulated    Other Active Problems  Status post cerebral angiogram and complete revascularization (TICI 3) of right MCA occlusion  S/P embolectomy left upper extremity - 08/23/2016 - Dr. Oneida Alar  Mild anemia  Mild leukocytosis  Ectatic ascending aorta, transverse diameter up to 4.1 cm.  Atrial fibrillation not anticoagulated   PLAN  Repeat Head CT  IV Heparin anticoagulation decision will be based on CT results.  Hospital day # 1  Mikey Bussing PA-C [facility] Pager [phone] I have personally examined this patient, reviewed notes, independently viewed imaging studies, participated in medical decision making and plan  of care.ROS completed by me personally and pertinent positives fully documented  I have made any additions or clarifications directly to the above note. Agree with note above. Remarkable elderly lady who presented with ischemic left arm secondary to brachial artery emboli and underwent successful embolectomy but developed right MCA clot requiring emergent mechanical embolectomy would good recanalization. She seems to be doing well with only mild left-sided peripheral vision loss. Plan repeat CT scan of the head this afternoon and if no hemorrhage start anticoagulation. No family available at the bedside for discussion. Discussed with Dr. Ashok Cordia from pulmonary critical care medicine and Dr. Earleen Newport from neuro interventional  service This patient is critically ill and at significant risk of neurological worsening, death and care requires constant monitoring of vital signs, hemodynamics,respiratory and cardiac monitoring, extensive review of multiple databases, frequent neurological assessment, discussion with family, other specialists and medical decision making of high complexity.I have made any additions or clarifications directly to the above note.This critical care time does not reflect procedure time, or teaching time or supervisory time of PA/NP/Med Resident etc but could involve care discussion time.  I spent 30 minutes of neurocritical care time  in the care of  this patient.      Antony Contras, MD Medical Director [facility] Pager: [phone] 08/24/2016 3:50 PM   08/24/2016, 1:11 PM   To contact Stroke Continuity provider, please refer to [email or web address] After hours, contact General Neurology

## 2016-08-24 NOTE — Progress Notes (Signed)
Referring Physician(s): Dr Pershing Proud  Supervising Physician: Corrie Mckusick  Patient Status:  [facility] - In-pt  Chief Complaint:  CVA R MCA revascularization 1/21 [facility] technique for complete revascularization of right MCA territory, TICI 3 Deployment of Exoseal for RCFA closure.  Subjective:  On vent Moving all 4s Following all commands Wants extubated!   Allergies: Patient has no known allergies.  Medications: Prior to Admission medications   Medication Sig Start Date End Date Taking? Authorizing Provider  amiodarone (PACERONE) 100 MG tablet Take 1 tablet (100 mg total) by mouth daily. Patient taking differently: Take 50 mg by mouth daily.  07/13/16  Yes Satira Sark, MD  aspirin EC 81 MG tablet Take 81 mg by mouth daily.   Yes Historical Provider, MD  cyproheptadine (PERIACTIN) 4 MG tablet Take 4 mg by mouth 3 (three) times daily as needed for allergies. Reported on 08/27/2015   Yes Historical Provider, MD  levothyroxine (SYNTHROID, LEVOTHROID) 137 MCG tablet Take 112 mcg by mouth daily before breakfast.  01/01/16  Yes Historical Provider, MD  Lutein-Zeaxanthin 25-5 MG CAPS Take 1 capsule by mouth 2 (two) times daily.   Yes Historical Provider, MD  metoprolol succinate (TOPROL XL) 25 MG 24 hr tablet Take 1 tablet (25 mg total) by mouth 2 (two) times daily. 07/13/16  Yes Satira Sark, MD  Multiple Vitamin (MULTIVITAMIN) tablet Take 1 tablet by mouth daily.     Yes Historical Provider, MD  triamcinolone cream (KENALOG) 0.1 % Apply 1 application topically daily as needed (irritated skin).  09/07/12  Yes Historical Provider, MD  triamterene-hydrochlorothiazide (DYAZIDE) 37.5-25 MG per capsule Take 1 capsule by mouth daily.     Yes Historical Provider, MD     Vital Signs: BP (!) 99/56   Pulse 65   Temp 98.4 F (36.9 C) (Axillary)   Resp 10   Ht 5\' 3"  (1.6 m)   Wt 143 lb 8.3 oz (65.1 kg)   SpO2 100%   BMI 25.42 kg/m   Physical Exam  Abdominal: Soft.    Musculoskeletal: Normal range of motion.  Left groin sheath intact No bleeding No hematoma 2+ pulses- L foot  Right groin clean and dry Sheath out  No hematoma 2+ pulses  Neurological: She is alert.  Skin: Skin is warm.  Psychiatric: She has a normal mood and affect.  Nursing note and vitals reviewed.   Imaging: Ct Angio Head W Or Wo Contrast  Result Date: 08/23/2016 CLINICAL DATA:  Acute onset of left-sided weakness. Abnormal head CT with hyperdense right MCA. EXAM: CT ANGIOGRAPHY HEAD AND NECK TECHNIQUE: Multidetector CT imaging of the head and neck was performed using the standard protocol during bolus administration of intravenous contrast. Multiplanar CT image reconstructions and MIPs were obtained to evaluate the vascular anatomy. Carotid stenosis measurements (when applicable) are obtained utilizing NASCET criteria, using the distal internal carotid diameter as the denominator. CONTRAST:  90 cc Isovue 370 COMPARISON:  Earlier same day FINDINGS: CTA NECK FINDINGS Aortic arch: Pronounced atherosclerotic change of the aorta. Ascending aorta dilated, up to 4.1 cm in diameter. Branching pattern of the brachiocephalic vessels from the arch is normal without origin stenosis. No evidence of dissection. Right carotid system: Common carotid artery widely patent to the bifurcation region. Atherosclerotic disease at the carotid bifurcation but no stenosis of the ICA compared to the more distal cervical ICA. Left carotid system: Common carotid artery widely patent to the bifurcation. Atherosclerotic disease at the carotid bifurcation. Minimal diameter of the  ICA measures 3 mm. Compared to a more distal cervical ICA diameter of 5 mm, this indicates a 40% stenosis. Vertebral arteries: 50% stenosis of the right vertebral artery. Beyond that, the vessel is widely patent through the neck. 50% stenosis at the left vertebral artery. Beyond that, the vessel is widely patent through the neck. Both vertebral  arteries are approximately equal in size. Skeleton: Chronic cervical spondylosis. 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 Presumably incidental in this case. Upper chest: Patchy airspace opacities bilaterally that are most consistent with interstitial and early alveolar edema. Pneumonia not excluded. Review of the MIP images confirms the above findings CTA HEAD FINDINGS Anterior circulation: Both internal carotid artery's widely patent through the skullbase. Carotid siphon atherosclerotic calcification without stenosis greater than 30%. Anterior and middle cerebral vessels on the left are widely patent. Anterior cerebral artery on the right is widely patent. Complete occlusion of the right MCA because of an embolus at the distal M1 segment. Posterior circulation: Both vertebral arteries widely patent at the foramen magnum. Atherosclerotic disease in the proximal intracranial portion with narrowing estimated at 30% bilaterally. Both vertebral arteries reach the basilar. Flow is present in [place] up bilaterally. Basilar artery shows mild atherosclerotic change but no flow limiting stenosis. Superior cerebellar and posterior cerebral arteries are patent. Patent posterior communicating arteries, larger on the right. Venous sinuses: Patent and normal Anatomic variants: None significant Delayed phase: Not performed Review of the MIP images confirms the above findings IMPRESSION: Atherosclerotic disease at both carotid bifurcations. No stenosis on the right. 40% stenosis of the proximal ICA on the left. Embolic occlusion of the right MCA at the M1 segment. Ectatic ascending aorta, transverse diameter up to 4.1 cm. Ascending aorta not completely imaged. 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 50% stenosis at both vertebral artery origins. These results were called by telephone at the time of interpretation on 08/23/2016 at 3:39 pm to Dr. Orlena Sheldon , who verbally  acknowledged these results. Electronically Signed   By: Nelson Chimes M.D.   On: 08/23/2016 15:40   Ct Head Wo Contrast  Result Date: 08/23/2016 CLINICAL DATA:  Stroke. Right MCA occlusion status post revascularization. EXAM: CT HEAD WITHOUT CONTRAST TECHNIQUE: Contiguous axial images were obtained from the base of the skull through the vertex without intravenous contrast. COMPARISON:  Head CT/ CTA/ cerebral perfusion earlier today FINDINGS: Brain: There is residual intravascular contrast from recent angiography. Gyral hyper density in the right insula and right temporal lobe suggests contrast staining from recent angiography although a component of petechial hemorrhage is also possible. There is mild gyral swelling in these regions. A subacute right cerebellar infarct is unchanged. There is mild cerebral atrophy and moderate chronic small vessel ischemic disease. Question infarction in the right cerebral peduncle and thalamus on the earlier head CT is not confirmed on this examination. There is no midline shift or extra-axial fluid collection. No mass. Vascular: Calcified atherosclerosis at the skullbase. Residual intravascular contrast material. Skull: No fracture or focal osseous lesion. Sinuses/Orbits: Small volume fluid in the sphenoid sinuses. Small left mastoid effusion, chronic in appearance. Prior bilateral cataract extraction. Other: None. IMPRESSION: 1. Contrast staining and mild gyral swelling involving the right insula and temporal lobe in the MCA territory. Superimposed petechial hemorrhage also possible. 2. Subacute right cerebellar infarct. Electronically Signed   By: Logan Bores M.D.   On: 08/23/2016 20:25   Ct Head Wo Contrast  Result Date:  08/23/2016 CLINICAL DATA:  Code stroke. Left-sided weakness. Absent pulses in left arm. EXAM: CT HEAD WITHOUT CONTRAST TECHNIQUE: Contiguous axial images were obtained from the base of the skull through the vertex without intravenous contrast.  COMPARISON:  Head CT 12/19/2015 FINDINGS: Brain: Moderate size subacute infarct in the inferior right cerebellum. No basilar cistern effacement. No additional findings of acute ischemia. Stable atrophy and chronic small vessel ischemia. No intracranial hemorrhage. No hydrocephalus. Vascular: Atherosclerosis of skullbase vasculature without hyperdense vessel or abnormal calcification. Skull: Normal. Negative for fracture or focal lesion. Sinuses/Orbits: Post bilateral cataract resection. Paranasal sinuses otherwise clear. Minimal opacification of lower left mastoid air cells is chronic. Other: None. IMPRESSION: Moderate size subacute infarct in the right inferior cerebellum. Generalized atrophy and chronic small vessel ischemia. These results were called by telephone at the time of interpretation on 08/23/2016 at 5:11 am to Dr. Rolland Porter , who verbally acknowledged these results. Electronically Signed   By: Jeb Levering M.D.   On: 08/23/2016 05:11   Ct Angio Neck W Or Wo Contrast  Result Date: 08/23/2016 CLINICAL DATA:  Acute onset of left-sided weakness. Abnormal head CT with hyperdense right MCA. EXAM: CT ANGIOGRAPHY HEAD AND NECK TECHNIQUE: Multidetector CT imaging of the head and neck was performed using the standard protocol during bolus administration of intravenous contrast. Multiplanar CT image reconstructions and MIPs were obtained to evaluate the vascular anatomy. Carotid stenosis measurements (when applicable) are obtained utilizing NASCET criteria, using the distal internal carotid diameter as the denominator. CONTRAST:  90 cc Isovue 370 COMPARISON:  Earlier same day FINDINGS: CTA NECK FINDINGS Aortic arch: Pronounced atherosclerotic change of the aorta. Ascending aorta dilated, up to 4.1 cm in diameter. Branching pattern of the brachiocephalic vessels from the arch is normal without origin stenosis. No evidence of dissection. Right carotid system: Common carotid artery widely patent to the  bifurcation region. Atherosclerotic disease at the carotid bifurcation but no stenosis of the ICA compared to the more distal cervical ICA. Left carotid system: Common carotid artery widely patent to the bifurcation. Atherosclerotic disease at the carotid bifurcation. Minimal diameter of the ICA measures 3 mm. Compared to a more distal cervical ICA diameter of 5 mm, this indicates a 40% stenosis. Vertebral arteries: 50% stenosis of the right vertebral artery. Beyond that, the vessel is widely patent through the neck. 50% stenosis at the left vertebral artery. Beyond that, the vessel is widely patent through the neck. Both vertebral arteries are approximately equal in size. Skeleton: Chronic cervical spondylosis. 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 Presumably incidental in this case. Upper chest: Patchy airspace opacities bilaterally that are most consistent with interstitial and early alveolar edema. Pneumonia not excluded. Review of the MIP images confirms the above findings CTA HEAD FINDINGS Anterior circulation: Both internal carotid artery's widely patent through the skullbase. Carotid siphon atherosclerotic calcification without stenosis greater than 30%. Anterior and middle cerebral vessels on the left are widely patent. Anterior cerebral artery on the right is widely patent. Complete occlusion of the right MCA because of an embolus at the distal M1 segment. Posterior circulation: Both vertebral arteries widely patent at the foramen magnum. Atherosclerotic disease in the proximal intracranial portion with narrowing estimated at 30% bilaterally. Both vertebral arteries reach the basilar. Flow is present in [place] up bilaterally. Basilar artery shows mild atherosclerotic change but no flow limiting stenosis. Superior cerebellar and posterior cerebral arteries are patent. Patent posterior communicating arteries, larger on the  right. Venous sinuses: Patent  and normal Anatomic variants: None significant Delayed phase: Not performed Review of the MIP images confirms the above findings IMPRESSION: Atherosclerotic disease at both carotid bifurcations. No stenosis on the right. 40% stenosis of the proximal ICA on the left. Embolic occlusion of the right MCA at the M1 segment. Ectatic ascending aorta, transverse diameter up to 4.1 cm. Ascending aorta not completely imaged. 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 50% stenosis at both vertebral artery origins. These results were called by telephone at the time of interpretation on 08/23/2016 at 3:39 pm to Dr. Orlena Sheldon , who verbally acknowledged these results. Electronically Signed   By: Nelson Chimes M.D.   On: 08/23/2016 15:40   Ct Angio Up Extrem Left W &/or Wo Contast  Result Date: 08/23/2016 CLINICAL DATA:  Left arm pain.  Absent left arm poles. EXAM: CT ANGIOGRAPHY UPPER LEFT EXTREMITY TECHNIQUE: Multidetector CT imaging of the left up was performed using the standard protocol during bolus administration of intravenous contrast. Multiplanar CT image reconstructions and MIPs were obtained to evaluate the vascular anatomy. CONTRAST:  100 cc Isovue 370 IV COMPARISON:  None. FINDINGS: Abrupt occlusion of the distal left axillary artery of the level of the proximal humerus. Occlusion spans approximately 5 cm, with distal reconstitutional of the brachial artery at the level of the mid proximal humerus. Arterial flow to the elbow is thready with small caliber vessels. Arterial flow is not well visualized distal to the elbow. Review of the MIP images confirms the above findings. IMPRESSION: Occlusion of the distal left axillary artery spanning 5 cm with distal reconstitution of the brachial artery in the mid proximal humerus. Brachial arterial flow to the elbow is thready with small caliber vessels. Flow is not documented distal to the elbow. These results were called by telephone at the time of interpretation on 08/23/2016  at 5:53 am to Dr. Rolland Porter , who verbally acknowledged these results. Electronically Signed   By: Jeb Levering M.D.   On: 08/23/2016 05:54   Ir US Guide Vasc Access Right  Result Date: 08/23/2016 INDICATION: 83 year old female with emergent large vessel occlusion of right MCA, an acute stroke. Symptoms were recognize approximately 1 p.m. in the PACU, status post embolectomy of acute ischemia of left arm, presumably thromboembolic disease. CT/CTA/perfusion demonstrated left MCA occlusion and a penumbra of approximately 300 cc. She did not receive tPA as she had just had embolectomy. EXAM: IR PERCUTANEOUS ART THORMBECTOMY/INFUSION INTRACRANIAL INCLUDE DIAG ANGIO; IR ULTRASOUND GUIDANCE VASC ACCESS RIGHT; IR ANGIO INTRA EXTRACRAN SEL COM CAROTID INNOMINATE UNI LEFT MOD SED COMPARISON:  CT 08/23/2016 MEDICATIONS: 2.0 g Ancef. The antibiotic was administered within 1 hour of the procedure ANESTHESIA/SEDATION: General endotracheal tube anesthesia provided by the anesthesia team CONTRAST:  170 cc Isovue-300 FLUOROSCOPY TIME:  Fluoroscopy Time: 93 minutes 48 seconds (2,056 mGy). COMPLICATIONS: None TECHNIQUE: Informed written consent was obtained from the patient's family after a thorough discussion of the procedural risks, benefits and alternatives. Specific risks discussed include: Bleeding, infection, contrast reaction, kidney injury/failure, need for further procedure/surgery, arterial injury or dissection, embolization to new territory, intracranial hemorrhage (10-15% risk), neurologic deterioration, cardiopulmonary collapse, death. All questions were addressed. Maximal Sterile Barrier Technique was utilized including during the procedure including caps, mask, sterile gowns, sterile gloves, sterile drape, hand hygiene and skin antiseptic. A timeout was performed prior to the initiation of the procedure. Ultrasound survey of the right inguinal region was performed with images stored and sent to PACs.  11 blade  scalpel was used to make a small incision. A micropuncture needle was used access the right common femoral artery under ultrasound. With excellent arterial blood flow returned, an .018 micro wire was passed through the needle, observed to enter the abdominal aorta under fluoroscopy. The needle was removed, and a micropuncture sheath was placed over the wire. The inner dilator and wire were removed, and an 035 Bentson wire was advanced under fluoroscopy into the abdominal aorta. The sheath was removed and a standard 5 [place] vascular sheath was placed. The dilator was removed and the sheath was flushed. Ultrasound survey of the left inguinal region was then performed with images stored and sent to PACs. A micropuncture needle was used access the left common femoral artery under ultrasound. With excellent arterial blood flow returned, an .018 micro wire was passed through the needle, observed to enter the abdominal aorta under fluoroscopy. The needle was removed, and a micropuncture sheath was placed over the wire. The inner dilator and wire were removed, and an 035 Bentson wire was advanced under fluoroscopy into the abdominal aorta. The sheath was removed and a standard 4 [place] vascular sheath was placed. The dilator was removed and the sheath was flushed. A 89F JB-1 diagnostic catheter was advanced over the wire to the proximal descending thoracic aorta. Wire was then removed. Double flush of the catheter was performed. JB 1 catheter was used in attempt to access the right innominate artery. Type 3 arch with tortuous vasculature was encountered. Both roadrunner wire and standard Glidewire were used. JB 1 catheter was then exchanged for a 5 [place] standard Simmons catheter. Catheter was reformed in the aortic arch and used to select the innominate artery. Once the catheter was in the proximal common carotid artery. Angiogram was performed. JB 1 catheter could not be advanced on a standard Glidewire or the roadrunner  wire secondary to the tortuosity. Multiple failure with prolapse into the aortic arch was encountered. Catheter was removed. The standard 5 French sheath was then exchanged for a bright tip 8 French 55 cm right common femoral artery sheath. Sheath was advanced over a Bentson wire. Dilator was removed and the sheath was attached to heparinized flush. Attempt to access the right common femoral artery was then performed using a standard 5 French Simmons catheter, a JB 2 catheter, a 5 French Simmons 2 glide cath. Once the glide cath [facility] 2 configuration was in the proximal common carotid artery, an exchange length standard Glidewire was advanced into the internal carotid artery. An exchange was then performed for a JB 1 catheter. This failed, with regression to the aortic arch. Glide catheter Simmons 2 was then again placed, with selection of the right common carotid artery. Exchange length glide was again advanced and exchange was made for a glide cath JB 1. JB 1 catheter was advanced into the distal internal carotid artery at the skullbase. Rosen wire was attempted to pass through the JB 1 catheter. Stiffness of the wire cause prolapse of the JB 1 catheter into the aortic arch. The glide cath [facility] 2 configuration was then again placed selecting the common carotid artery. Exchange length Glidewire was then passed into the distal internal carotid artery under road map angiogram. Rosita Fire 2 catheter was then replaced for glide cath JB 1, and we had success advancing exchange length roadrunner wire to the skullbase. JB 1 catheter was then removed and 80 cm Neuron Max was advanced over the roadrunner wire into the right carotid system. The  dilator was removed and the sheath was carefully aspirated and flushed with heparinized saline flush. Ace 68 catheter was then passed over the roadrunner wire into the internal carotid artery. Catheter was flushed and flushed with heparinized saline drip. A coaxial Trevo provue  microcatheter was passed with a synchro soft wire through the Ace catheter to the skullbase. The microcatheter and micro wire configuration were passed into the proximal M1. During the attempt to advance the aspiration catheter over the microcatheter prolapse into the aortic arch was recognized. This could not be salvaged and the entire coaxial system was removed. The glide cath [facility] 2 configuration was then replaced again with selection of the common carotid artery. Similar stepwise progression into the right common carotid was performed with exchange length Glidewire on road map angiogram, exchanged for JB 1 glide cath, and then exchanged for exchange length roadrunner wire. On this attempt, a 90 cm Neuron Max was advanced into the right carotid system. Again, a 12 catheter was passed through the neuron max into the distal common carotid artery and a coaxial microcatheter and synchro soft were passed to the skullbase. Under roadmap angiogram, microcatheter system was advanced into the internal carotid artery, to the level of the occlusion. The micro wire was then carefully advanced through the occluded segment. Microcatheter was then push through the occluded segment. The aspiration catheter was advanced through the cavernous segment. Wire was then removed through the microcatheter. Blood was then aspirated through the hub of the microcatheter, and a gentle contrast injection was performed confirming intraluminal position. A rotating hemostatic valve was then attached to the back end of the microcatheter, and a pressurized and heparinized saline bag was attached to the catheter. 4 x 40 solitaire device was then selected. Back flush was achieved at the rotating hemostatic valve, and then the device was gently advanced through the microcatheter to the distal end. The retriever was then unsheathed by withdrawing the microcatheter under fluoroscopy. Once the retriever was completely unsheathed, control angiogram  was performed from the Ace catheter. Constant aspiration (first attempt) was then performed at the ACE 68 as the retriever was gently and slowly withdrawn with fluoroscopic observation. Once the retriever was entirely removed from the system, free aspiration was confirmed at the hub of the Ace catheter, with free blood return confirmed. Rotating hemostatic valve was removed, aspiration was performed at the hub clearing the catheter, and a control angiogram was performed. The microcatheter system was then advanced through the Ace catheter for second attempt after incomplete revascularization from the first. Again under road mapping angiogram, the micro wire and microcatheter were advanced through a partial occlusion of the distal M1 segment, and then the wire was removed. Blood was then aspirated through the hub of the microcatheter, and a gentle contrast injection was performed confirming intraluminal position beyond the occlusion. Rotating hemostatic valve was then attached to the back of the microcatheter, and pressurized and heparinized saline bag was attached. The solitaire device was then readvanced through the microcatheter to the distal and. The retriever was un sheath by withdrawing the microcatheter under fluoroscopy. Control angiogram was then performed from the Ace catheter. Constant aspiration (second attempt) was then performed at the ACE 68 as the retriever was gently and slowly withdrawn with fluoroscopic observation. Once the retriever was entirely removed from the system, free aspiration was confirmed at the hub of the Ace catheter, with free blood return confirmed. Rotating hemostatic valve was removed, aspiration was performed at the hub clearing the catheter, and  a control angiogram was performed. The microcatheter system was then advanced through the Ace catheter for third attempt after incomplete revascularization from the second. Again under road mapping angiogram, the micro wire and  microcatheter were used to select an occluded M2 branch to what appear to be the parietal region. Once the catheter was through the site of the occlusion, blood was then aspirated through the hub of the microcatheter, and a gentle contrast injection was performed confirming intraluminal position beyond the occlusion. Rotating hemostatic valve was then attached to the back of the microcatheter, and pressurized and heparinized saline bag was attached. The solitaire device was then readvanced through the microcatheter to the distal end. The retriever was un sheath by withdrawing the microcatheter under fluoroscopy. Control angiogram was then performed from the Ace catheter. Constant aspiration (third attempt) was then performed at the ACE 68 as the retriever was gently and slowly withdrawn with fluoroscopic observation. Once the retriever was entirely removed from the system, free aspiration was confirmed at the hub of the Ace catheter, with free blood return confirmed. Rotating hemostatic valve was removed, aspiration was performed at the hub clearing the catheter, and a control angiogram was performed. The Neuron Max and the ACE catheter were removed. JB 1 catheter was used in an attempt to select the left common carotid artery. Exchange was then made for standard 5 [place] Simmons 2 configuration catheter. This was used to select the common carotid artery. Angiogram of the left common carotid artery was performed. Catheter was removed. Control angiogram was performed at the right common femoral artery puncture site. The 55 centimeter 8 French sheath was exchanged for a standard 8 French sheath at the common femoral artery puncture site. Exoseal was then deployed for hemostasis. Patient tolerated the procedure well and remained hemodynamically stable throughout. No complications were encountered and no significant blood loss encountered. FINDINGS: Aortic arch: Type 3 aortic arch with mild atherosclerotic changes.  Common origin of the innominate artery and the left common carotid artery. Tortuosity of the innominate artery, right common carotid artery. Right common carotid artery: Severely tortuous common carotid artery. No significant stenosis or occlusion. Mild atherosclerotic changes at the bifurcation. Relatively straight course of the cervical right ICA. Right external carotid artery: Patent with antegrade flow. Right internal carotid artery: Minimal atherosclerotic changes of the intracranial right ICA. Occlusion of the M1 segment. Patent A1 segment with perfusion of the right ACA territory. Fetal type posterior communicating artery. Right ACA: A 1 segment patent. A 2 segment perfuses the right territory. Status post first attempt there is partial reconstitution with TICI 2a flow. Status post second attempt there is further reconstitution with TICI 2b flow. Status post third attempt there is complete reconstitution with TICI 3 flow. Left common carotid artery: Common origin with the innominate artery. Mild atherosclerotic changes at the bifurcation. Left external carotid artery: Patent with antegrade flow. Left internal carotid artery: Normal course caliber and contour of the cervical portion. Vertical and petrous segment patent with normal course caliber contour. Cavernous segment patent. Clinoid segment patent. Antegrade flow of the ophthalmic artery. Ophthalmic segment patent. Terminus patent. Left MCA: M1 segment patent. Insular and opercular segments patent. Unremarkable caliber and course of the cortical segments. Typical arterial, capillary/ parenchymal, and venous phase. Left ACA: A 1 segment patent. A 2 segment perfuses the right territory. IMPRESSION: Status post cerebral angiogram and complete revascularization (TICI 3) of right MCA occlusion with 3 passes of SOLUMBRA technique, using stentriever technology and local aspiration. Deployment of  Exoseal for hemostasis. Signed, Dulcy Fanny. Earleen Newport, DO [facility] Radiology Electronically Signed   By: Corrie Mckusick D.O.   On: 08/23/2016 21:36   Ct Cerebral Perfusion W Contrast  Result Date: 08/23/2016 CLINICAL DATA:  Acute onset of left-sided weakness. EXAM: CT PERFUSION BRAIN TECHNIQUE: Multiphase CT imaging of the brain was performed following IV bolus contrast injection. Subsequent parametric perfusion maps were calculated using RAPID software. CONTRAST:  90 cc Isovue 370 COMPARISON:  CTA same day FINDINGS: CT Brain Perfusion Findings: CBF (<30%) Volume: 75mL Perfusion (Tmax>6.0s) volume: 294ML Mismatch Volume: 217mL Infarction Location:Right MCA territory. CBF less than 30% volume approaches 70 cc, but is primarily within the deep white matter rather than the cortex. Additionally, there is a fairly large amount of brain with CBF less than 34% and even more less than 38%. Considering CBF less than 38%, volume would be 133 mL. IMPRESSION: Embolic occlusion of the right M1 segment. Central blood flow less than 30% volume of 67 cc. T-max greater than 6 seconds 294 cc. Mismatch volume 227 cc. Confounding factors as discussed in the last paragraph above. Electronically Signed   By: Nelson Chimes M.D.   On: 08/23/2016 15:44   Ir Percutaneous Art Thrombectomy/infusion Intracranial Inc Diag Angio  Result Date: 08/23/2016 INDICATION: 83 year old female with emergent large vessel occlusion of right MCA, an acute stroke. Symptoms were recognize approximately 1 p.m. in the PACU, status post embolectomy of acute ischemia of left arm, presumably thromboembolic disease. CT/CTA/perfusion demonstrated left MCA occlusion and a penumbra of approximately 300 cc. She did not receive tPA as she had just had embolectomy. EXAM: IR PERCUTANEOUS ART THORMBECTOMY/INFUSION INTRACRANIAL INCLUDE DIAG ANGIO; IR ULTRASOUND GUIDANCE VASC ACCESS RIGHT; IR ANGIO INTRA EXTRACRAN SEL COM CAROTID INNOMINATE UNI LEFT MOD SED COMPARISON:  CT 08/23/2016  MEDICATIONS: 2.0 g Ancef. The antibiotic was administered within 1 hour of the procedure ANESTHESIA/SEDATION: General endotracheal tube anesthesia provided by the anesthesia team CONTRAST:  170 cc Isovue-300 FLUOROSCOPY TIME:  Fluoroscopy Time: 93 minutes 48 seconds (2,056 mGy). COMPLICATIONS: None TECHNIQUE: Informed written consent was obtained from the patient's family after a thorough discussion of the procedural risks, benefits and alternatives. Specific risks discussed include: Bleeding, infection, contrast reaction, kidney injury/failure, need for further procedure/surgery, arterial injury or dissection, embolization to new territory, intracranial hemorrhage (10-15% risk), neurologic deterioration, cardiopulmonary collapse, death. All questions were addressed. Maximal Sterile Barrier Technique was utilized including during the procedure including caps, mask, sterile gowns, sterile gloves, sterile drape, hand hygiene and skin antiseptic. A timeout was performed prior to the initiation of the procedure. Ultrasound survey of the right inguinal region was performed with images stored and sent to PACs. 11 blade scalpel was used to make a small incision. A micropuncture needle was used access the right common femoral artery under ultrasound. With excellent arterial blood flow returned, an .018 micro wire was passed through the needle, observed to enter the abdominal aorta under fluoroscopy. The needle was removed, and a micropuncture sheath was placed over the wire. The inner dilator and wire were removed, and an 035 Bentson wire was advanced under fluoroscopy into the abdominal aorta. The sheath was removed and a standard 5 [place] vascular sheath was placed. The dilator was removed and the sheath was flushed. Ultrasound survey of the left inguinal region was then performed with images stored and sent to PACs. A micropuncture needle was used access the left common femoral artery under ultrasound. With excellent  arterial blood flow returned,  an .018 micro wire was passed through the needle, observed to enter the abdominal aorta under fluoroscopy. The needle was removed, and a micropuncture sheath was placed over the wire. The inner dilator and wire were removed, and an 035 Bentson wire was advanced under fluoroscopy into the abdominal aorta. The sheath was removed and a standard 4 [place] vascular sheath was placed. The dilator was removed and the sheath was flushed. A 32F JB-1 diagnostic catheter was advanced over the wire to the proximal descending thoracic aorta. Wire was then removed. Double flush of the catheter was performed. JB 1 catheter was used in attempt to access the right innominate artery. Type 3 arch with tortuous vasculature was encountered. Both roadrunner wire and standard Glidewire were used. JB 1 catheter was then exchanged for a 5 [place] standard Simmons catheter. Catheter was reformed in the aortic arch and used to select the innominate artery. Once the catheter was in the proximal common carotid artery. Angiogram was performed. JB 1 catheter could not be advanced on a standard Glidewire or the roadrunner wire secondary to the tortuosity. Multiple failure with prolapse into the aortic arch was encountered. Catheter was removed. The standard 5 French sheath was then exchanged for a bright tip 8 French 55 cm right common femoral artery sheath. Sheath was advanced over a Bentson wire. Dilator was removed and the sheath was attached to heparinized flush. Attempt to access the right common femoral artery was then performed using a standard 5 French Simmons catheter, a JB 2 catheter, a 5 French Simmons 2 glide cath. Once the glide cath [facility] 2 configuration was in the proximal common carotid artery, an exchange length standard Glidewire was advanced into the internal carotid artery. An exchange was then performed for a JB 1 catheter. This failed, with regression to the aortic arch. Glide catheter Simmons 2  was then again placed, with selection of the right common carotid artery. Exchange length glide was again advanced and exchange was made for a glide cath JB 1. JB 1 catheter was advanced into the distal internal carotid artery at the skullbase. Rosen wire was attempted to pass through the JB 1 catheter. Stiffness of the wire cause prolapse of the JB 1 catheter into the aortic arch. The glide cath [facility] 2 configuration was then again placed selecting the common carotid artery. Exchange length Glidewire was then passed into the distal internal carotid artery under road map angiogram. Rosita Fire 2 catheter was then replaced for glide cath JB 1, and we had success advancing exchange length roadrunner wire to the skullbase. JB 1 catheter was then removed and 80 cm Neuron Max was advanced over the roadrunner wire into the right carotid system. The dilator was removed and the sheath was carefully aspirated and flushed with heparinized saline flush. Ace 68 catheter was then passed over the roadrunner wire into the internal carotid artery. Catheter was flushed and flushed with heparinized saline drip. A coaxial Trevo provue microcatheter was passed with a synchro soft wire through the Ace catheter to the skullbase. The microcatheter and micro wire configuration were passed into the proximal M1. During the attempt to advance the aspiration catheter over the microcatheter prolapse into the aortic arch was recognized. This could not be salvaged and the entire coaxial system was removed. The glide cath [facility] 2 configuration was then replaced again with selection of the common carotid artery. Similar stepwise progression into the right common carotid was performed with exchange length Glidewire on road map angiogram, exchanged for JB  1 glide cath, and then exchanged for exchange length roadrunner wire. On this attempt, a 90 cm Neuron Max was advanced into the right carotid system. Again, a 86 catheter was passed through the  neuron max into the distal common carotid artery and a coaxial microcatheter and synchro soft were passed to the skullbase. Under roadmap angiogram, microcatheter system was advanced into the internal carotid artery, to the level of the occlusion. The micro wire was then carefully advanced through the occluded segment. Microcatheter was then push through the occluded segment. The aspiration catheter was advanced through the cavernous segment. Wire was then removed through the microcatheter. Blood was then aspirated through the hub of the microcatheter, and a gentle contrast injection was performed confirming intraluminal position. A rotating hemostatic valve was then attached to the back end of the microcatheter, and a pressurized and heparinized saline bag was attached to the catheter. 4 x 40 solitaire device was then selected. Back flush was achieved at the rotating hemostatic valve, and then the device was gently advanced through the microcatheter to the distal end. The retriever was then unsheathed by withdrawing the microcatheter under fluoroscopy. Once the retriever was completely unsheathed, control angiogram was performed from the Ace catheter. Constant aspiration (first attempt) was then performed at the ACE 68 as the retriever was gently and slowly withdrawn with fluoroscopic observation. Once the retriever was entirely removed from the system, free aspiration was confirmed at the hub of the Ace catheter, with free blood return confirmed. Rotating hemostatic valve was removed, aspiration was performed at the hub clearing the catheter, and a control angiogram was performed. The microcatheter system was then advanced through the Ace catheter for second attempt after incomplete revascularization from the first. Again under road mapping angiogram, the micro wire and microcatheter were advanced through a partial occlusion of the distal M1 segment, and then the wire was removed. Blood was then aspirated through  the hub of the microcatheter, and a gentle contrast injection was performed confirming intraluminal position beyond the occlusion. Rotating hemostatic valve was then attached to the back of the microcatheter, and pressurized and heparinized saline bag was attached. The solitaire device was then readvanced through the microcatheter to the distal and. The retriever was un sheath by withdrawing the microcatheter under fluoroscopy. Control angiogram was then performed from the Ace catheter. Constant aspiration (second attempt) was then performed at the ACE 68 as the retriever was gently and slowly withdrawn with fluoroscopic observation. Once the retriever was entirely removed from the system, free aspiration was confirmed at the hub of the Ace catheter, with free blood return confirmed. Rotating hemostatic valve was removed, aspiration was performed at the hub clearing the catheter, and a control angiogram was performed. The microcatheter system was then advanced through the Ace catheter for third attempt after incomplete revascularization from the second. Again under road mapping angiogram, the micro wire and microcatheter were used to select an occluded M2 branch to what appear to be the parietal region. Once the catheter was through the site of the occlusion, blood was then aspirated through the hub of the microcatheter, and a gentle contrast injection was performed confirming intraluminal position beyond the occlusion. Rotating hemostatic valve was then attached to the back of the microcatheter, and pressurized and heparinized saline bag was attached. The solitaire device was then readvanced through the microcatheter to the distal end. The retriever was un sheath by withdrawing the microcatheter under fluoroscopy. Control angiogram was then performed from the Ace catheter. Constant aspiration (  third attempt) was then performed at the ACE 68 as the retriever was gently and slowly withdrawn with fluoroscopic  observation. Once the retriever was entirely removed from the system, free aspiration was confirmed at the hub of the Ace catheter, with free blood return confirmed. Rotating hemostatic valve was removed, aspiration was performed at the hub clearing the catheter, and a control angiogram was performed. The Neuron Max and the ACE catheter were removed. JB 1 catheter was used in an attempt to select the left common carotid artery. Exchange was then made for standard 5 [place] Simmons 2 configuration catheter. This was used to select the common carotid artery. Angiogram of the left common carotid artery was performed. Catheter was removed. Control angiogram was performed at the right common femoral artery puncture site. The 55 centimeter 8 French sheath was exchanged for a standard 8 French sheath at the common femoral artery puncture site. Exoseal was then deployed for hemostasis. Patient tolerated the procedure well and remained hemodynamically stable throughout. No complications were encountered and no significant blood loss encountered. FINDINGS: Aortic arch: Type 3 aortic arch with mild atherosclerotic changes. Common origin of the innominate artery and the left common carotid artery. Tortuosity of the innominate artery, right common carotid artery. Right common carotid artery: Severely tortuous common carotid artery. No significant stenosis or occlusion. Mild atherosclerotic changes at the bifurcation. Relatively straight course of the cervical right ICA. Right external carotid artery: Patent with antegrade flow. Right internal carotid artery: Minimal atherosclerotic changes of the intracranial right ICA. Occlusion of the M1 segment. Patent A1 segment with perfusion of the right ACA territory. Fetal type posterior communicating artery. Right ACA: A 1 segment patent. A 2 segment perfuses the right territory. Status post first attempt there is partial reconstitution with TICI 2a flow. Status post second attempt there  is further reconstitution with TICI 2b flow. Status post third attempt there is complete reconstitution with TICI 3 flow. Left common carotid artery: Common origin with the innominate artery. Mild atherosclerotic changes at the bifurcation. Left external carotid artery: Patent with antegrade flow. Left internal carotid artery: Normal course caliber and contour of the cervical portion. Vertical and petrous segment patent with normal course caliber contour. Cavernous segment patent. Clinoid segment patent. Antegrade flow of the ophthalmic artery. Ophthalmic segment patent. Terminus patent. Left MCA: M1 segment patent. Insular and opercular segments patent. Unremarkable caliber and course of the cortical segments. Typical arterial, capillary/ parenchymal, and venous phase. Left ACA: A 1 segment patent. A 2 segment perfuses the right territory. IMPRESSION: Status post cerebral angiogram and complete revascularization (TICI 3) of right MCA occlusion with 3 passes of SOLUMBRA technique, using stentriever technology and local aspiration. Deployment of Exoseal for hemostasis. Signed, Dulcy Fanny. Earleen Newport, DO [facility] Radiology Electronically Signed   By: Corrie Mckusick D.O.   On: 08/23/2016 21:36   Ct Head Code Stroke Wo Contrast  Result Date: 08/23/2016 CLINICAL DATA:  Code stroke. Left-sided weakness and left-sided gaze disturbance. EXAM: CT HEAD WITHOUT CONTRAST TECHNIQUE: Contiguous axial images were obtained from the base of the skull through the vertex without intravenous contrast. COMPARISON:  Earlier same day FINDINGS: Brain: Acute infarction with in the inferior cerebellum on the right as seen previously. Question infarction in the region of the right thalamus/ cerebral peduncle. Cerebral hemispheres elsewhere show atrophy and chronic small-vessel ischemic change of the white matter. Newly seen hyperdense right MCA not seen earlier. Vascular: Newly seen hyperdense  right MCA. There is atherosclerotic  calcification of the major vessels at the base of the brain. Skull: Negative Sinuses/Orbits: Negative Other: None ASPECTS ([place] Stroke Program Early CT Score) - Ganglionic level infarction (caudate, lentiform nuclei, internal capsule, insula, M1-M3 cortex): 7 - Supraganglionic infarction (M4-M6 cortex): 3 Total score (0-10 with 10 being normal): 10 IMPRESSION: 1. Re- demonstration of acute infarction at the inferior cerebellum on the right. Question involvement of the right cerebral peduncle and thalamus. Newly seen hyperdense right MCA consistent with embolus to that vessel. No sign of brain infarction in the territory at this time however. Aspects 10. These results were called by telephone at the time of interpretation on 08/23/2016 at 2:39 pm to Dr. Orlena Sheldon , who verbally acknowledged these results. 2. ASPECTS is Electronically Signed   By: Nelson Chimes M.D.   On: 08/23/2016 14:45   Ir Angio Intra Extracran Sel Com Carotid Innominate Uni L Mod Sed  Result Date: 08/23/2016 INDICATION: 83 year old female with emergent large vessel occlusion of right MCA, an acute stroke. Symptoms were recognize approximately 1 p.m. in the PACU, status post embolectomy of acute ischemia of left arm, presumably thromboembolic disease. CT/CTA/perfusion demonstrated left MCA occlusion and a penumbra of approximately 300 cc. She did not receive tPA as she had just had embolectomy. EXAM: IR PERCUTANEOUS ART THORMBECTOMY/INFUSION INTRACRANIAL INCLUDE DIAG ANGIO; IR ULTRASOUND GUIDANCE VASC ACCESS RIGHT; IR ANGIO INTRA EXTRACRAN SEL COM CAROTID INNOMINATE UNI LEFT MOD SED COMPARISON:  CT 08/23/2016 MEDICATIONS: 2.0 g Ancef. The antibiotic was administered within 1 hour of the procedure ANESTHESIA/SEDATION: General endotracheal tube anesthesia provided by the anesthesia team CONTRAST:  170 cc Isovue-300 FLUOROSCOPY TIME:  Fluoroscopy Time: 93 minutes 48 seconds (2,056 mGy). COMPLICATIONS: None  TECHNIQUE: Informed written consent was obtained from the patient's family after a thorough discussion of the procedural risks, benefits and alternatives. Specific risks discussed include: Bleeding, infection, contrast reaction, kidney injury/failure, need for further procedure/surgery, arterial injury or dissection, embolization to new territory, intracranial hemorrhage (10-15% risk), neurologic deterioration, cardiopulmonary collapse, death. All questions were addressed. Maximal Sterile Barrier Technique was utilized including during the procedure including caps, mask, sterile gowns, sterile gloves, sterile drape, hand hygiene and skin antiseptic. A timeout was performed prior to the initiation of the procedure. Ultrasound survey of the right inguinal region was performed with images stored and sent to PACs. 11 blade scalpel was used to make a small incision. A micropuncture needle was used access the right common femoral artery under ultrasound. With excellent arterial blood flow returned, an .018 micro wire was passed through the needle, observed to enter the abdominal aorta under fluoroscopy. The needle was removed, and a micropuncture sheath was placed over the wire. The inner dilator and wire were removed, and an 035 Bentson wire was advanced under fluoroscopy into the abdominal aorta. The sheath was removed and a standard 5 [place] vascular sheath was placed. The dilator was removed and the sheath was flushed. Ultrasound survey of the left inguinal region was then performed with images stored and sent to PACs. A micropuncture needle was used access the left common femoral artery under ultrasound. With excellent arterial blood flow returned, an .018 micro wire was passed through the needle, observed to enter the abdominal aorta under fluoroscopy. The needle was removed, and a micropuncture sheath was placed over the wire. The inner dilator and wire were removed, and an 035 Bentson wire was advanced under  fluoroscopy into the abdominal aorta. The sheath was removed and a standard 4 [place] vascular sheath was placed. The  dilator was removed and the sheath was flushed. A 4F JB-1 diagnostic catheter was advanced over the wire to the proximal descending thoracic aorta. Wire was then removed. Double flush of the catheter was performed. JB 1 catheter was used in attempt to access the right innominate artery. Type 3 arch with tortuous vasculature was encountered. Both roadrunner wire and standard Glidewire were used. JB 1 catheter was then exchanged for a 5 [place] standard Simmons catheter. Catheter was reformed in the aortic arch and used to select the innominate artery. Once the catheter was in the proximal common carotid artery. Angiogram was performed. JB 1 catheter could not be advanced on a standard Glidewire or the roadrunner wire secondary to the tortuosity. Multiple failure with prolapse into the aortic arch was encountered. Catheter was removed. The standard 5 French sheath was then exchanged for a bright tip 8 French 55 cm right common femoral artery sheath. Sheath was advanced over a Bentson wire. Dilator was removed and the sheath was attached to heparinized flush. Attempt to access the right common femoral artery was then performed using a standard 5 French Simmons catheter, a JB 2 catheter, a 5 French Simmons 2 glide cath. Once the glide cath [facility] 2 configuration was in the proximal common carotid artery, an exchange length standard Glidewire was advanced into the internal carotid artery. An exchange was then performed for a JB 1 catheter. This failed, with regression to the aortic arch. Glide catheter Simmons 2 was then again placed, with selection of the right common carotid artery. Exchange length glide was again advanced and exchange was made for a glide cath JB 1. JB 1 catheter was advanced into the distal internal carotid artery at the skullbase. Rosen wire was attempted to pass through the JB 1  catheter. Stiffness of the wire cause prolapse of the JB 1 catheter into the aortic arch. The glide cath [facility] 2 configuration was then again placed selecting the common carotid artery. Exchange length Glidewire was then passed into the distal internal carotid artery under road map angiogram. Rosita Fire 2 catheter was then replaced for glide cath JB 1, and we had success advancing exchange length roadrunner wire to the skullbase. JB 1 catheter was then removed and 80 cm Neuron Max was advanced over the roadrunner wire into the right carotid system. The dilator was removed and the sheath was carefully aspirated and flushed with heparinized saline flush. Ace 68 catheter was then passed over the roadrunner wire into the internal carotid artery. Catheter was flushed and flushed with heparinized saline drip. A coaxial Trevo provue microcatheter was passed with a synchro soft wire through the Ace catheter to the skullbase. The microcatheter and micro wire configuration were passed into the proximal M1. During the attempt to advance the aspiration catheter over the microcatheter prolapse into the aortic arch was recognized. This could not be salvaged and the entire coaxial system was removed. The glide cath [facility] 2 configuration was then replaced again with selection of the common carotid artery. Similar stepwise progression into the right common carotid was performed with exchange length Glidewire on road map angiogram, exchanged for JB 1 glide cath, and then exchanged for exchange length roadrunner wire. On this attempt, a 90 cm Neuron Max was advanced into the right carotid system. Again, a 95 catheter was passed through the neuron max into the distal common carotid artery and a coaxial microcatheter and synchro soft were passed to the skullbase. Under roadmap angiogram, microcatheter system was advanced into the internal  carotid artery, to the level of the occlusion. The micro wire was then carefully advanced through  the occluded segment. Microcatheter was then push through the occluded segment. The aspiration catheter was advanced through the cavernous segment. Wire was then removed through the microcatheter. Blood was then aspirated through the hub of the microcatheter, and a gentle contrast injection was performed confirming intraluminal position. A rotating hemostatic valve was then attached to the back end of the microcatheter, and a pressurized and heparinized saline bag was attached to the catheter. 4 x 40 solitaire device was then selected. Back flush was achieved at the rotating hemostatic valve, and then the device was gently advanced through the microcatheter to the distal end. The retriever was then unsheathed by withdrawing the microcatheter under fluoroscopy. Once the retriever was completely unsheathed, control angiogram was performed from the Ace catheter. Constant aspiration (first attempt) was then performed at the ACE 68 as the retriever was gently and slowly withdrawn with fluoroscopic observation. Once the retriever was entirely removed from the system, free aspiration was confirmed at the hub of the Ace catheter, with free blood return confirmed. Rotating hemostatic valve was removed, aspiration was performed at the hub clearing the catheter, and a control angiogram was performed. The microcatheter system was then advanced through the Ace catheter for second attempt after incomplete revascularization from the first. Again under road mapping angiogram, the micro wire and microcatheter were advanced through a partial occlusion of the distal M1 segment, and then the wire was removed. Blood was then aspirated through the hub of the microcatheter, and a gentle contrast injection was performed confirming intraluminal position beyond the occlusion. Rotating hemostatic valve was then attached to the back of the microcatheter, and pressurized and heparinized saline bag was attached. The solitaire device was then  readvanced through the microcatheter to the distal and. The retriever was un sheath by withdrawing the microcatheter under fluoroscopy. Control angiogram was then performed from the Ace catheter. Constant aspiration (second attempt) was then performed at the ACE 68 as the retriever was gently and slowly withdrawn with fluoroscopic observation. Once the retriever was entirely removed from the system, free aspiration was confirmed at the hub of the Ace catheter, with free blood return confirmed. Rotating hemostatic valve was removed, aspiration was performed at the hub clearing the catheter, and a control angiogram was performed. The microcatheter system was then advanced through the Ace catheter for third attempt after incomplete revascularization from the second. Again under road mapping angiogram, the micro wire and microcatheter were used to select an occluded M2 branch to what appear to be the parietal region. Once the catheter was through the site of the occlusion, blood was then aspirated through the hub of the microcatheter, and a gentle contrast injection was performed confirming intraluminal position beyond the occlusion. Rotating hemostatic valve was then attached to the back of the microcatheter, and pressurized and heparinized saline bag was attached. The solitaire device was then readvanced through the microcatheter to the distal end. The retriever was un sheath by withdrawing the microcatheter under fluoroscopy. Control angiogram was then performed from the Ace catheter. Constant aspiration (third attempt) was then performed at the ACE 68 as the retriever was gently and slowly withdrawn with fluoroscopic observation. Once the retriever was entirely removed from the system, free aspiration was confirmed at the hub of the Ace catheter, with free blood return confirmed. Rotating hemostatic valve was removed, aspiration was performed at the hub clearing the catheter, and a control angiogram was  performed.  The Neuron Max and the ACE catheter were removed. JB 1 catheter was used in an attempt to select the left common carotid artery. Exchange was then made for standard 5 [place] Simmons 2 configuration catheter. This was used to select the common carotid artery. Angiogram of the left common carotid artery was performed. Catheter was removed. Control angiogram was performed at the right common femoral artery puncture site. The 55 centimeter 8 French sheath was exchanged for a standard 8 French sheath at the common femoral artery puncture site. Exoseal was then deployed for hemostasis. Patient tolerated the procedure well and remained hemodynamically stable throughout. No complications were encountered and no significant blood loss encountered. FINDINGS: Aortic arch: Type 3 aortic arch with mild atherosclerotic changes. Common origin of the innominate artery and the left common carotid artery. Tortuosity of the innominate artery, right common carotid artery. Right common carotid artery: Severely tortuous common carotid artery. No significant stenosis or occlusion. Mild atherosclerotic changes at the bifurcation. Relatively straight course of the cervical right ICA. Right external carotid artery: Patent with antegrade flow. Right internal carotid artery: Minimal atherosclerotic changes of the intracranial right ICA. Occlusion of the M1 segment. Patent A1 segment with perfusion of the right ACA territory. Fetal type posterior communicating artery. Right ACA: A 1 segment patent. A 2 segment perfuses the right territory. Status post first attempt there is partial reconstitution with TICI 2a flow. Status post second attempt there is further reconstitution with TICI 2b flow. Status post third attempt there is complete reconstitution with TICI 3 flow. Left common carotid artery: Common origin with the innominate artery. Mild atherosclerotic changes at the bifurcation. Left external carotid artery: Patent with antegrade flow.  Left internal carotid artery: Normal course caliber and contour of the cervical portion. Vertical and petrous segment patent with normal course caliber contour. Cavernous segment patent. Clinoid segment patent. Antegrade flow of the ophthalmic artery. Ophthalmic segment patent. Terminus patent. Left MCA: M1 segment patent. Insular and opercular segments patent. Unremarkable caliber and course of the cortical segments. Typical arterial, capillary/ parenchymal, and venous phase. Left ACA: A 1 segment patent. A 2 segment perfuses the right territory. IMPRESSION: Status post cerebral angiogram and complete revascularization (TICI 3) of right MCA occlusion with 3 passes of SOLUMBRA technique, using stentriever technology and local aspiration. Deployment of Exoseal for hemostasis. Signed, Dulcy Fanny. Earleen Newport, DO [facility] Electronically Signed   By: Corrie Mckusick D.O.   On: 08/23/2016 21:36    Labs:  CBC:  Recent Labs  12/19/15 1232 08/23/16 0354 08/24/16 0600  WBC 8.0 10.3 12.1*  HGB 15.3* 13.2 11.2*  HCT 46.0 39.6 34.2*  PLT 190 199 181    COAGS:  Recent Labs  08/23/16 0354  INR 1.00  APTT 30    BMP:  Recent Labs  12/19/15 1232 08/23/16 0354 08/24/16 0600  NA 134* 135 139  K 3.9 3.2* 3.8  CL 99* 97* 108  CO2 26 25 23   GLUCOSE 95 139* 144*  BUN 17 22* 8  CALCIUM 9.5 9.0 7.8*  CREATININE 0.73 0.94 0.71  GFRNONAA >60 51* >60  GFRAA >60 59* >60    LIVER FUNCTION TESTS:  Recent Labs  12/19/15 1232 08/23/16 0354  BILITOT 0.8 1.0  AST 28 36  ALT 18 25  ALKPHOS 59 76  PROT 6.6 6.4*  ALBUMIN 3.9 3.6    Assessment and Plan:  CVA R MCA revascularization in IR 1/21 Doing well Wants extubated Moving  all 4s and following all commands  Electronically Signed: Croix Presley A 08/24/2016, 10:41 AM   I spent a total of 15 Minutes at the the patient's bedside AND on the patient's hospital floor or unit, greater  than 50% of which was counseling/coordinating care for CVA; R MCA revasc

## 2016-08-24 NOTE — Progress Notes (Signed)
OT Cancellation Note  Patient Details Name: Christine Pugh MRN: [id number] DOB: 1921/12/26   Cancelled Treatment:    Reason Eval/Treat Not Completed: Patient not medically ready.  Pt intubated (plan to extubate today) and has femoral sheath.  Will reattempt tomorrow.  Shaunika Italiano [place], OTR/L [id number]   Lucille Passy M 08/24/2016, 11:14 AM

## 2016-08-24 NOTE — Progress Notes (Signed)
Neuro IR  Left 4 french femoral sheath removed at 1140.  VPAD and manual pressure applied to achieve hemostasis at 1155.  Distal pulses intact.  Site reviewed with Marin Olp.  No acute comlications.  [facility]

## 2016-08-24 NOTE — Progress Notes (Signed)
PT Cancellation Note  Patient Details Name: Christine Pugh MRN: [id number] DOB: March 30, 1922   Cancelled Treatment:    Reason Eval/Treat Not Completed: Patient not medically ready Plan to extubate today; sheath still in place and will need bedrest once that is removed so will hold PT today and follow up tomorrow as tolerated.   Marguarite Arbour A Marcas Bowsher 08/24/2016, 11:14 AM Wray Kearns, [place], DPT [phone]

## 2016-08-25 ENCOUNTER — Encounter (HOSPITAL_COMMUNITY): Payer: Self-pay | Admitting: Radiology

## 2016-08-25 DIAGNOSIS — I6601 Occlusion and stenosis of right middle cerebral artery: Secondary | ICD-10-CM

## 2016-08-25 DIAGNOSIS — I060 Rheumatic aortic stenosis: Secondary | ICD-10-CM

## 2016-08-25 DIAGNOSIS — I10 Essential (primary) hypertension: Secondary | ICD-10-CM

## 2016-08-25 LAB — CBC WITH DIFFERENTIAL/PLATELET
Basophils Absolute: 0 10*3/uL (ref 0.0–0.1)
Basophils Relative: 0 %
Eosinophils Absolute: 0.2 10*3/uL (ref 0.0–0.7)
Eosinophils Relative: 2 %
HEMATOCRIT: 30.7 % — AB (ref 36.0–46.0)
HEMOGLOBIN: 10.1 g/dL — AB (ref 12.0–15.0)
LYMPHS ABS: 1.2 10*3/uL (ref 0.7–4.0)
Lymphocytes Relative: 11 %
MCH: 32.1 pg (ref 26.0–34.0)
MCHC: 32.9 g/dL (ref 30.0–36.0)
MCV: 97.5 fL (ref 78.0–100.0)
MONO ABS: 1 10*3/uL (ref 0.1–1.0)
MONOS PCT: 10 %
NEUTROS ABS: 8.2 10*3/uL — AB (ref 1.7–7.7)
NEUTROS PCT: 77 %
Platelets: 158 10*3/uL (ref 150–400)
RBC: 3.15 MIL/uL — ABNORMAL LOW (ref 3.87–5.11)
RDW: 14.5 % (ref 11.5–15.5)
WBC: 10.6 10*3/uL — ABNORMAL HIGH (ref 4.0–10.5)

## 2016-08-25 LAB — LIPID PANEL
CHOL/HDL RATIO: 2.9 ratio
Cholesterol: 164 mg/dL (ref 0–200)
HDL: 56 mg/dL (ref >40)
LDL Cholesterol: 94 mg/dL (ref 0–99)
Triglycerides: 69 mg/dL (ref <150)
VLDL: 14 mg/dL (ref 0–40)

## 2016-08-25 LAB — RENAL FUNCTION PANEL
ALBUMIN: 2.3 g/dL — AB (ref 3.5–5.0)
Anion gap: 6 (ref 5–15)
BUN: 9 mg/dL (ref 6–20)
CO2: 25 mmol/L (ref 22–32)
CREATININE: 0.69 mg/dL (ref 0.44–1.00)
Calcium: 8 mg/dL — ABNORMAL LOW (ref 8.9–10.3)
Chloride: 109 mmol/L (ref 101–111)
GFR calc Af Amer: >60 mL/min (ref >60)
GLUCOSE: 113 mg/dL — AB (ref 65–99)
PHOSPHORUS: 2 mg/dL — AB (ref 2.5–4.6)
Potassium: 3.3 mmol/L — ABNORMAL LOW (ref 3.5–5.1)
SODIUM: 140 mmol/L (ref 135–145)

## 2016-08-25 LAB — HEPARIN LEVEL (UNFRACTIONATED)
Heparin Unfractionated: 0.15 IU/mL — ABNORMAL LOW (ref 0.30–0.70)
Heparin Unfractionated: 0.36 IU/mL (ref 0.30–0.70)
Heparin Unfractionated: 0.33 IU/mL (ref 0.30–0.70)

## 2016-08-25 LAB — CALCIUM, IONIZED: CALCIUM, IONIZED, SERUM: 4.8 mg/dL (ref 4.5–5.6)

## 2016-08-25 LAB — MAGNESIUM: Magnesium: 1.7 mg/dL (ref 1.7–2.4)

## 2016-08-25 MED ORDER — ATORVASTATIN CALCIUM 20 MG PO TABS
20.0000 mg | ORAL_TABLET | Freq: Every day | ORAL | Status: DC
  Administered 2016-08-25 – 2016-08-27 (×3): 20 mg via ORAL
  Filled 2016-08-25 (×3): qty 1

## 2016-08-25 NOTE — Progress Notes (Signed)
PULMONARY / CRITICAL CARE MEDICINE   Name: Christine Pugh MRN: [id number] DOB: May 10, 1922    ADMISSION DATE:  08/23/2016 CONSULTATION DATE:  08/23/16   REFERRING MD:  Christine Pugh  CHIEF COMPLAINT:  Left axillary embolus, acute R MCA stroke  HISTORY OF PRESENT ILLNESS:   Christine Pugh is a 83F admitted 08/23/16 with complaints of acute onset left arm paralysis 6 hours prior to presentation with persistent sensory complaints. She has a PMH significant for atrial fibrillation/flutter, not on anticoagulation, moderate aortic stenosis, hypertension and hypothyroidism. Evaluation revealed L axillary embolus and subacute L cerebellar stroke. She was taken urgently to the OR for revascularization. Shortly after the procedure, she was noted to have a change in neuro status with left facial droop, rightward gaze, slurred speech and left hemiparesis. CODE STROKE was initiated. CTA showed a R MCA embolus and she was taken for mechanical thrombectomy by IR. Post-procedure she remains intubated. PCCM is consulted for ventilator management.   SUBJECTIVE:  Extubated , stable can go to floor   VITAL SIGNS: BP 114/61   Pulse 69   Temp 97.7 F (36.5 C) (Oral)   Resp (!) 22   Ht 5\' 3"  (1.6 m)   Wt 143 lb 8.3 oz (65.1 kg)   SpO2 99%   BMI 25.42 kg/m   HEMODYNAMICS:    VENTILATOR SETTINGS: Vent Mode: PSV;CPAP FiO2 (%):  [30 %] 30 % PEEP:  [5 cmH20] 5 cmH20 Pressure Support:  [5 cmH20] 5 cmH20  INTAKE / OUTPUT: I/O last 3 completed shifts: In: 4503.8 [P.O.:240; I.V.:3948.8; IV Piggyback:315] Out: [id number] [Urine:1025; Blood:100]  PHYSICAL EXAMINATION:  Temp:  [97.7 F (36.5 C)-98.4 F (36.9 C)] 97.7 F (36.5 C) (01/23 0800) Pulse Rate:  [60-70] 69 (01/23 0700) Resp:  [10-22] 22 (01/23 0700) BP: (94-114)/(48-67) 114/61 (01/23 0700) SpO2:  [90 %-100 %] 99 % (01/23 0700) Arterial Line BP: (101-134)/(51-58) 116/51 (01/22 1130) FiO2 (%):  [30 %] 30 % (01/22 1400)  General:  Elderly female in NAD  off vent Neuro:  Spontaneously awake, alert, follows commands. HEENT:  /AT, No JVD noted, PERRL Cardiovascular:  RRR, 3/6 SEM Lungs:  Clear Abdomen:  Soft, non-distended Musculoskeletal:  No acute deformity Skin:  Intact, MMM, rt foot warm    LABS:  BMET  Recent Labs Lab 08/23/16 0354 08/24/16 0600 08/25/16 0331  NA 135 139 140  K 3.2* 3.8 3.3*  CL 97* 108 109  CO2 25 23 25   BUN 22* 8 9  CREATININE 0.94 0.71 0.69  GLUCOSE 139* 144* 113*    Electrolytes  Recent Labs Lab 08/23/16 0354 08/24/16 0600 08/25/16 0331  CALCIUM 9.0 7.8* 8.0*  MG  --   --  1.7  PHOS  --   --  2.0*    CBC  Recent Labs Lab 08/23/16 0354 08/24/16 0600 08/25/16 0331  WBC 10.3 12.1* 10.6*  HGB 13.2 11.2* 10.1*  HCT 39.6 34.2* 30.7*  PLT 199 181 158    Coag's  Recent Labs Lab 08/23/16 0354  APTT 30  INR 1.00    Sepsis Markers No results for input(s): LATICACIDVEN, PROCALCITON, O2SATVEN in the last 168 hours.  ABG  Recent Labs Lab 08/23/16 2200  PHART 7.485*  PCO2ART 31.6*  PO2ART 108    Liver Enzymes  Recent Labs Lab 08/23/16 0354 08/25/16 0331  AST 36  --   ALT 25  --   ALKPHOS 76  --   BILITOT 1.0  --   ALBUMIN 3.6 2.3*  Cardiac Enzymes  Recent Labs Lab 08/23/16 0354  TROPONINI <0.03    Glucose  Recent Labs Lab 08/23/16 2042  GLUCAP 121*    Imaging Ct Head Wo Contrast  Result Date: 08/24/2016 CLINICAL DATA:  83 year old female with right middle cerebral artery occlusion post revascularization. Subsequent encounter. EXAM: CT HEAD WITHOUT CONTRAST TECHNIQUE: Contiguous axial images were obtained from the base of the skull through the vertex without intravenous contrast. COMPARISON:  08/23/2016 head CT. FINDINGS: Brain: Right posterior inferior cerebellar artery distribution infarct involving majority of inferior aspect the right cerebellum with swelling and narrowing of the outlet of the fourth ventricle. This will need to be monitored  closely as the patient may be at risk for development of hydrocephalus. Question tiny petechial hemorrhage along the periphery of this infarct. Interval clearing of contrast enhancement involving right subinsular and operculum region. Now noted is an acute nonhemorrhagic infarct of the right subinsular/ peri operculum region. Global atrophy without hydrocephalus. No intracranial mass lesion noted on this unenhanced exam. Vascular: Vascular calcifications. Skull: No acute abnormality. Sinuses/Orbits: No acute orbital abnormality post lens replacement. Mild mucosal thickening sphenoid sinus bilaterally. Other: None. IMPRESSION: Right posterior inferior cerebellar artery distribution infarct involving majority of inferior aspect the right cerebellum with swelling and narrowing of the outlet of the fourth ventricle. This will need to be monitored closely as the patient may be at risk for development of hydrocephalus. Question tiny petechial hemorrhage along the periphery of this infarct. Interval clearing of contrast enhancement involving right subinsular and operculum region. Now noted is an acute nonhemorrhagic infarct of the right subinsular/ peri operculum region. Electronically Signed   By: Christine Pugh M.D.   On: 08/24/2016 18:24    STUDIES:  As above  CULTURES: none  ANTIBIOTICS: Perioperative cefazolin / cefuroxime  SIGNIFICANT EVENTS: Acute stroke (R MCA) post L axillar embolectomy 1/21  LINES/TUBES:  Fem art sheath 08/23/16 >>1/22  DISCUSSION: Christine Pugh is a 83F admitted with acute LUE thrombosis (axillary), subacute cerebellar infarct and acute R MCA occlusion post-procedure, now status post IR mechanical thrombectomy. PMH significant for a fib/flutter not on a/c, aortic stenosis, HTN, hypothyroidism.  Extubated 1/22 and transferring to floor 1/23  ASSESSMENT / PLAN: NEUROLOGIC A:   Acute R MCA occlusion Subacute cerebellar infarct P:   RASS goal: 0  SBP goal 120-140mmHg per  neuro (cardene and phenylephrine as needed) Frequent neuro checks IR and neurology managing Defer heparin gtt to IR/neurology/vascular surgery  PULMONARY A: Need for intubation 2/2 acute stroke and thrombectomy procedure P:   Extubated 1/22   CARDIOVASCULAR A:  Acute L axillary embolus s/p revascularization Paroxysmal A fib/flutter Aortic stenosis (mod-severe) Aortic insufficiency (mild) Bradycardia - likely propofol related P:  Pulse checks Hold home metoprolol while brady Hold home amiodarone for now BP control as above  RENAL A:   Hypocalcemia P:   Follow BMP ionized calcium 4.8  GASTROINTESTINAL A:   No acute issues P:   Pantoprazole Advance diet  HEMATOLOGIC A:   Embolic events, likely a fib/flutter related P:  Heparin gtt   INFECTIOUS A:   No acute issues P:     ENDOCRINE A:   Hypothyroidism   P:   Continue home synthroid Glycemic control as needed  FAMILY  - Updates: No family at bedside - Inter-disciplinary family meet or Palliative Care meeting due by:  1/26 -PCCM will sign off 1/23    Richardson Landry Minor ACNP Maryanna Shape PCCM Pager [phone] till 3 pm If no answer page [phone]  08/25/2016, 9:56 AM

## 2016-08-25 NOTE — Progress Notes (Signed)
ANTICOAGULATION CONSULT NOTE - Follow Up Consult  Pharmacy Consult for Heparin  Indication: atrial fibrillation, DVT and stroke  No Known Allergies  Patient Measurements: Height: 5\' 3"  (160 cm) Weight: 143 lb 8.3 oz (65.1 kg) IBW/kg (Calculated) : 52.4  Vital Signs: Temp: 98.2 F (36.8 C) (01/23 1258) Temp Source: Oral (01/23 1258) BP: 136/52 (01/23 1258) Pulse Rate: 67 (01/23 1258)  Labs:  Recent Labs  08/23/16 0354 08/24/16 0600 08/25/16 0331 08/25/16 1411  HGB 13.2 11.2* 10.1*  --   HCT 39.6 34.2* 30.7*  --   PLT 199 181 158  --   APTT 30  --   --   --   LABPROT 13.2  --   --   --   INR 1.00  --   --   --   HEPARINUNFRC  --   --  0.15* 0.36  CREATININE 0.94 0.71 0.69  --   TROPONINI <0.03  --   --   --     Estimated Creatinine Clearance (by C-G formula based on SCr of 0.69 mg/dL) Female: 39.9 mL/min Female: 46.4 mL/min  Medications: Heparin @ 850 units/hr  Assessment: 83yo female s/p CVA, s/p embolectomy of LUE DVT, and with hx afib continues on heparin with plans to transition to eliquis in a few days if cerebellar infarct is stable. Heparin level is therapeutic at 0.36.   Goal of Therapy:  Heparin level 0.3-0.5 units/mL Monitor platelets by anticoagulation protocol: Yes   Plan:  1) Continue heparin at 850 units/hr 2) Check 8 hour heparin level to confirm  Deboraha Sprang 08/25/2016,2:51 PM

## 2016-08-25 NOTE — Evaluation (Addendum)
Occupational Therapy Evaluation Patient Details Name: Christine Pugh MRN: [id number] DOB: February 05, 1922 Today's Date: 08/25/2016    History of Present Illness This 83 y.o. female admitted with acute onset of Lt UE paralysis.  CT of head revealed Lt cerebellar subacute infarct and CT of UE showed Lt axillary embolus.  She was transferred to [facility] and underwent Lt brachial radial ulnar axillary embolectomy.  Pt developed neuro decline post up.  Repeat head CT old Lt cerebellar infarct, subacute Rt cerebellar infarct and Rt MCA clot visible - likely due to A-Fib.   She underwent cerebral thrombectomy and complete revascularization.   She was extubated 08/24/16.  PMH includes:  Atypical a-flutter, aortic stenosis, h/o 3 falls in 18 mos,    Clinical Impression   Pt admitted with above. She demonstrates the below listed deficits and will benefit from continued OT to maximize safety and independence with BADLs.  Pt presents to OT with impaired balance, generalized weakness, ? Visual deficits, and mild cognitive deficits with.  She currently requires mod A for ADLs and mod A +2 for functional mobility.   She lived alone PTA, but will have hired caregiver at discharge.  Recommend CIR. Will follow acutely.   02 sats 83% on RA.   On 2L sats decreased to 87% after ambualting to BR.  Increased 02 to 3L with pt maintaingin sats in low 90s with activity.       Follow Up Recommendations  CIR;Supervision/Assistance - 24 hour    Equipment Recommendations  None recommended by OT    Recommendations for Other Services Rehab consult     Precautions / Restrictions Precautions Precautions: Fall Restrictions Weight Bearing Restrictions: No      Mobility Bed Mobility Overal bed mobility: Needs Assistance Bed Mobility: Supine to Sit;Sit to Supine     Supine to sit: Min assist Sit to supine: Min assist   General bed mobility comments: assist to initiate activity and assist to move LEs off and on bed    Transfers Overall transfer level: Needs assistance   Transfers: Sit to/from Stand;Stand Pivot Transfers Sit to Stand: Mod assist;+2 physical assistance Stand pivot transfers: Mod assist;+2 physical assistance       General transfer comment: Pt requires assist to move into standing and assist for balance and to help pivot during transfer     Balance Overall balance assessment: Needs assistance Sitting-balance support: Feet supported Sitting balance-Leahy Scale: Fair     Standing balance support: Bilateral upper extremity supported Standing balance-Leahy Scale: Poor Standing balance comment: Pt requires bil. UE support and mod A                             ADL Overall ADL's : Needs assistance/impaired Eating/Feeding: Set up;Bed level   Grooming: Wash/dry hands;Wash/dry face;Oral care;Brushing hair;Moderate assistance;Standing   Upper Body Bathing: Set up;Supervision/ safety;Sitting   Lower Body Bathing: Maximal assistance;Sit to/from stand   Upper Body Dressing : Minimal assistance;Sitting   Lower Body Dressing: Total assistance;Sit to/from stand   Toilet Transfer: Moderate assistance;+2 for physical assistance;Ambulation;Comfort height toilet Toilet Transfer Details (indicate cue type and reason): requires assist to move into standing and assist for balance.  Toileting- Clothing Manipulation and Hygiene: Total assistance;Sit to/from stand Toileting - Clothing Manipulation Details (indicate cue type and reason): She is able to assist minimally with pulling underpants over left hip      Functional mobility during ADLs: Moderate assistance;+2 for physical assistance  Vision Additional Comments: Pt has been using phone and iPad independently.  She denies visual changes.  Attempted to assess, but pt too fatigued to participate in visual assessment at end of session.  Neurology notes indicate mild homonymous hemianopsia    Perception  Perception Perception Tested?: Yes   Praxis Praxis Praxis tested?: Within functional limits    Pertinent Vitals/Pain Pain Assessment: No/denies pain     Hand Dominance Right   Extremity/Trunk Assessment Upper Extremity Assessment Upper Extremity Assessment: Generalized weakness   Lower Extremity Assessment Lower Extremity Assessment: Defer to PT evaluation   Cervical / Trunk Assessment Cervical / Trunk Assessment: Kyphotic   Communication Communication Communication: HOH   Cognition Arousal/Alertness: Awake/alert Behavior During Therapy: WFL for tasks assessed/performed Overall Cognitive Status: Impaired/Different from baseline Area of Impairment: Attention;Following commands;Problem solving   Current Attention Level: Selective   Following Commands: Follows multi-step commands with increased time (and verbal cues )     Problem Solving: Difficulty sequencing;Requires verbal cues;Requires tactile cues General Comments: Pt required min cues for safety while ambulating into bathroom.  She demonstrated difficulty sequencing task in novel environment.  Requires instructions to be repeated several times - HOH may be contributing    General Comments       Exercises       Shoulder Instructions      Home Living Family/patient expects to be discharged to:: Inpatient rehab Living Arrangements: Alone Available Help at Discharge: Family;Available PRN/intermittently Type of Home: Apartment Home Access: Level entry     Home Layout: One level     Bathroom Shower/Tub: Teacher, early years/pre: Standard     Home Equipment: Tub bench;Walker - 2 wheels;Cane - single point;Toilet riser          Prior Functioning/Environment Level of Independence: Independent with assistive device(s)        Comments: Uses SPC for ambulation; has cleaning lady. Drives. Per chart, pt with h/o falls, but she denies this during eval         OT Problem List: Decreased  strength;Decreased activity tolerance;Impaired balance (sitting and/or standing);Decreased cognition;Decreased safety awareness;Decreased knowledge of use of DME or AE;Cardiopulmonary status limiting activity   OT Treatment/Interventions: Self-care/ADL training;Therapeutic exercise;Energy conservation;DME and/or AE instruction;Therapeutic activities;Cognitive remediation/compensation;Patient/family education;Balance training;Visual/perceptual remediation/compensation    OT Goals(Current goals can be found in the care plan section) Acute Rehab OT Goals Patient Stated Goal: to go home  OT Goal Formulation: With patient Time For Goal Achievement: 09/08/16 Potential to Achieve Goals: Good ADL Goals Pt Will Perform Grooming: with min assist;standing Pt Will Perform Upper Body Bathing: with min assist;sitting Pt Will Perform Lower Body Bathing: with min assist;sit to/from stand Pt Will Perform Upper Body Dressing: with min assist;sitting Pt Will Perform Lower Body Dressing: with min assist;sit to/from stand Pt Will Transfer to Toilet: with min assist;ambulating;regular height toilet;grab bars;bedside commode Pt Will Perform Toileting - Clothing Manipulation and hygiene: with min assist;sit to/from stand  OT Frequency: Min 2X/week   Barriers to D/C:            Co-evaluation PT/OT/SLP Co-Evaluation/Treatment: Yes Reason for Co-Treatment: Complexity of the patient's impairments (multi-system involvement);For patient/therapist safety   OT goals addressed during session: ADL's and self-care      End of Session Equipment Utilized During Treatment: Oxygen Nurse Communication: Mobility status  Activity Tolerance: Patient limited by fatigue Patient left: in bed;with call bell/phone within reach;with bed alarm set   Time: 1438-1500 OT Time Calculation (min): 22 min Charges:  OT General Charges $  OT Visit: 1 Procedure OT Evaluation $OT Eval Moderate Complexity: 1 Procedure G-Codes:     Donyel Nester M 09/06/16, 3:26 PM

## 2016-08-25 NOTE — Care Management Note (Signed)
Case Management Note  Patient Details  Name: Christine Pugh MRN: [id number] Date of Birth: 1921-09-21  Subjective/Objective:  Pt admitted on 08/23/16 s/p Lt cerebellar stroke with Lt axillary embolus.  PTA, pt very independent, lives alone.                     Action/Plan: PT/OT evaluations pending.  Case manager will follow for discharge planning needs as pt progresses.    Expected Discharge Date:                  Expected Discharge Plan:  [place]  In-House Referral:     Discharge planning Services  CM Consult  Post Acute Care Choice:    Choice offered to:     DME Arranged:    DME Agency:     HH Arranged:    [place] Agency:     Status of Service:  In process, will continue to follow  If discussed at Long Length of Stay Meetings, dates discussed:    Additional Comments:  Reinaldo Raddle, RN, BSN  Trauma/Neuro ICU Case Manager [phone]

## 2016-08-25 NOTE — Consult Note (Signed)
ELECTROPHYSIOLOGY CONSULT NOTE    Primary Care Physician: Gar Ponto, MD Referring Physician:  Dr Oneida Alar  Admit Date: 08/23/2016  Reason for consultation:  afib  Christine Pugh is a 83 y.o. female with a h/o atrial fibrillation and atrial flutter flutter, hypertension, and prior stroke who is admitted with vascular embolism to brachial artery and brain.  She is well known to me from the office (05/01/16 note reviewed).  She has an implanted loop recorder which has previously revealed Afib.  She has been on amiodarone for afib management.  I have previously offered anticoagulation which she has declined.   She was admitted on transfer with subacute R cerebellar infarct as well L arm brachial artery emboli.  She has been managed quite aggressively and has made significant recovery. She was initially intubated but has been extubated.  She is now alert and recovering from her event.  She in on IV heparin presently.   Past Medical History:  Diagnosis Date  . Aortic insufficiency    Mild  . Aortic stenosis    Moderate to severe  . Atypical atrial flutter ([place])   . Benign essential HTN   . Embolic stroke involving right cerebellar artery ([place])   . Hypothyroidism   . Ischemia of extremity    Past Surgical History:  Procedure Laterality Date  . APPENDECTOMY    . BREAST LUMPECTOMY    . dental implant    . EP IMPLANTABLE DEVICE N/A 01/10/2016   Procedure: Loop Recorder Insertion;  Surgeon: [name], MD;  Location: [facility];  Service: Cardiovascular;  Laterality: N/A;  . IR GENERIC HISTORICAL  08/23/2016   IR US GUIDE VASC ACCESS RIGHT 08/23/2016 Corrie Mckusick, DO [facility]  . IR GENERIC HISTORICAL  08/23/2016   IR PERCUTANEOUS ART THROMBECTOMY/INFUSION INTRACRANIAL INC DIAG ANGIO 08/23/2016 Corrie Mckusick, DO [facility]  . IR GENERIC HISTORICAL  08/23/2016   IR ANGIO INTRA EXTRACRAN SEL COM CAROTID INNOMINATE UNI L MOD SED 08/23/2016 Corrie Mckusick, DO [facility]  . RADIOLOGY  WITH ANESTHESIA Bilateral 08/23/2016   Procedure: RADIOLOGY WITH ANESTHESIA;  Surgeon: Medication Radiologist, MD;  Location: [facility];  Service: Radiology;  Laterality: Bilateral;  . THROMBECTOMY BRACHIAL ARTERY Left 08/23/2016   Procedure: Left Radial, Ulnar, Brachial and Axillary Embolectomy;  Surgeon: Elam Dutch, MD;  Location: [facility];  Service: Vascular;  Laterality: Left;  . THYROIDECTOMY    . TONSILLECTOMY    . TOTAL ABDOMINAL HYSTERECTOMY    . VITRECTOMY AND CATARACT      . amiodarone  50 mg Oral Daily  . aspirin EC  81 mg Oral Daily  . atorvastatin  20 mg Oral q1800  . docusate sodium  100 mg Oral Daily  . levothyroxine  112 mcg Oral QAC breakfast  . multivitamin with minerals  1 tablet Oral Daily  . pantoprazole  40 mg Oral Daily  . triamterene-hydrochlorothiazide  1 capsule Oral Daily   . heparin 850 Units/hr (08/25/16 0430)    No Known Allergies  Social History   Social History  . Marital status: Widowed    Spouse name: N/A  . Number of children: N/A  . Years of education: N/A   Occupational History  . Retired    Social History Main Topics  . Smoking status: Never Smoker  . Smokeless tobacco: Never Used  . Alcohol use No  . Drug use: No  . Sexual activity: Not on file   Other Topics Concern  . Not on file  Social History Narrative   Pt lives in [place] alone.   Widowed   Retired Network engineer    Family History  Problem Relation Age of Onset  . Coronary artery disease      ROS- all systems reviewed and negative except as per HPI  Physical Exam: Telemetry: Vitals:   08/25/16 0900 08/25/16 1000 08/25/16 1100 08/25/16 1258  BP: (!) 133/53 (!) 115/47 (!) 119/49 (!) 136/52  Pulse: 70 68 63 67  Resp: (!) 24 (!) 21 (!) 38 17  Temp:    98.2 F (36.8 C)  TempSrc:    Oral  SpO2: 100% 99% 97% 98%  Weight:      Height:        GEN- The patient is elderly appearing, alert and oriented x 3 today.   Head- normocephalic, atraumatic Eyes-  Sclera clear,  conjunctiva pink Ears- hearing intact Oropharynx- clear Neck- supple,   Lungs- Clear to ausculation bilaterally, normal work of breathing Heart- Regular rate and rhythm, 2/6 SEM LUSB which is late peaking GI- soft, NT, ND, + BS Extremities- no clubbing, cyanosis, or edema MS- age appropriate atrophy Skin- no rash or lesion  EKG 08/23/16- sinus rhythm  Labs:   Lab Results  Component Value Date   WBC 10.6 (H) 08/25/2016   HGB 10.1 (L) 08/25/2016   HCT 30.7 (L) 08/25/2016   MCV 97.5 08/25/2016   PLT 158 08/25/2016    Recent Labs Lab 08/23/16 0354  08/25/16 0331  NA 135  < > 140  K 3.2*  < > 3.3*  CL 97*  < > 109  CO2 25  < > 25  BUN 22*  < > 9  CREATININE 0.94  < > 0.69  CALCIUM 9.0  < > 8.0*  PROT 6.4*  --   --   BILITOT 1.0  --   --   ALKPHOS 76  --   --   ALT 25  --   --   AST 36  --   --   GLUCOSE 139*  < > 113*  < > = values in this interval not displayed.  Echo:  Reviewed,  EF 65%, moderate to severe AS  ASSESSMENT AND PLAN:   1. Atrial fibrillation/ atrial flutter The patient presents with acute embolic phenomenon.  She has previously declined anticoagulation. She is currently in sinus rhythm.  She is very fortunately that she is making recovery. Currently she is on heparin drip. I agree with Dr Leonie Man that eliquis would be a good option for anticoagulation once she is able.  Even with her advanced age, the appropriate dose would be 5mg  BID.  I will defer timing of initiation to Dr Leonie Man and the stroke team. chads2vasc score is at least 6  2. Moderate to severe AS Stable No change required today  3. HTN Stable No change required today  Electrophysiology team to see as needed while here. Please call with questions.   [name], MD 08/25/2016  5:00 PM

## 2016-08-25 NOTE — Progress Notes (Signed)
STROKE TEAM PROGRESS NOTE   HISTORY OF PRESENT ILLNESS (per record) SHAYANNE VADER is a 83 y.o. female. transferred from outside hospital due to subacute right cerebellar infarct on CT early this am and decreased vascular perfusion of the left arm due to brachial artery emboli.  She has paroxysmal a. Fib.  She underwent uneventful embolectomy today and was last seen normal at 1 pm.  At 2 pm, RN saw patient with mental status changes, right gaze deviation, and left hemiparesis.  CT Brain  Shows no hemorrhage, but old left cerebellar infarct and subacute right cerebellar infarct and right MCA clot visible.  I see hypodensity in the right frontal and parietal areas, but radiologist does not confirm that.  CTA Brain and Neck with perfusion studies were ordered in anticipation of mechanical thrombectomy.  She is not an ideal candidate for IV tPA given recent surgery.     SUBJECTIVE (INTERVAL HISTORY) No family members present. The patient is   alert  And talking on her phone with her son. No focal deficits noted except for a left visual field cut.Blood pressure adequately controlled.   OBJECTIVE Temp:  [97.7 F (36.5 C)-98.4 F (36.9 C)] 98.2 F (36.8 C) (01/23 1258) Pulse Rate:  [60-70] 67 (01/23 1258) Cardiac Rhythm: Heart block (01/23 1211) Resp:  [15-38] 17 (01/23 1258) BP: (94-136)/(47-67) 136/52 (01/23 1258) SpO2:  [90 %-100 %] 98 % (01/23 1258) FiO2 (%):  [30 %] 30 % (01/22 1400)  CBC:   Recent Labs Lab 08/23/16 0354 08/24/16 0600 08/25/16 0331  WBC 10.3 12.1* 10.6*  NEUTROABS 8.4*  --  8.2*  HGB 13.2 11.2* 10.1*  HCT 39.6 34.2* 30.7*  MCV 97.5 96.1 97.5  PLT 199 181 0000000    Basic Metabolic Panel:   Recent Labs Lab 08/24/16 0600 08/25/16 0331  NA 139 140  K 3.8 3.3*  CL 108 109  CO2 23 25  GLUCOSE 144* 113*  BUN 8 9  CREATININE 0.71 0.69  CALCIUM 7.8* 8.0*  MG  --  1.7  PHOS  --  2.0*    Lipid Panel:     Component Value Date/Time   CHOL 164 08/25/2016 0331    TRIG 69 08/25/2016 0331   HDL 56 08/25/2016 0331   CHOLHDL 2.9 08/25/2016 0331   VLDL 14 08/25/2016 0331   LDLCALC 94 08/25/2016 0331   HgbA1c: No results found for: HGBA1C Urine Drug Screen:     Component Value Date/Time   LABOPIA NONE DETECTED 08/24/2016 1648   COCAINSCRNUR NONE DETECTED 08/24/2016 1648   LABBENZ POSITIVE (A) 08/24/2016 1648   AMPHETMU NONE DETECTED 08/24/2016 1648   THCU NONE DETECTED 08/24/2016 1648   LABBARB NONE DETECTED 08/24/2016 1648      IMAGING  Ct Angio Head and Neck W Or Wo Contrast 08/23/2016 Atherosclerotic disease at both carotid bifurcations. No stenosis on the right. 40% stenosis of the proximal ICA on the left. Embolic occlusion of the right MCA at the M1 segment.  Ectatic ascending aorta, transverse diameter up to 4.1 cm.  Ascending aorta not completely imaged. 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  50% stenosis at both vertebral artery origins.    Ct Head Wo Contrast 08/23/2016 1. Contrast staining and mild gyral swelling involving the right insula and temporal lobe in the MCA territory.  Superimposed petechial hemorrhage also possible.  2. Subacute right cerebellar infarct.     Ct Head Wo Contrast 08/23/2016 Moderate size subacute infarct in the right  inferior cerebellum.  Generalized atrophy and chronic small vessel ischemia.      Ct Angio Up Extrem Left W &/or Wo Contast 08/23/2016 Occlusion of the distal left axillary artery spanning 5 cm with distal reconstitution of the brachial artery in the mid proximal humerus.  Brachial arterial flow to the elbow is thready with small caliber vessels.      Ct Cerebral Perfusion W Contrast [id number] Embolic occlusion of the right M1 segment. Central blood flow less than 30% volume of 67 cc. T-max greater than 6 seconds 294 cc. Mismatch volume 227 cc. Confounding factors as discussed in the last paragraph above.     Ir Percutaneous Art Thrombectomy/infusion Intracranial Inc  Diag Angio 08/23/2016 Status post cerebral angiogram and complete revascularization (TICI 3) of right MCA occlusion with 3 passes of SOLUMBRA technique, using stentriever technology and local aspiration. Deployment of Exoseal for hemostasis.     Ct Head Code Stroke Wo Contrast 08/23/2016 Re- demonstration of acute infarction at the inferior cerebellum on the right.  Question involvement of the right cerebral peduncle and thalamus.  Newly seen hyperdense right MCA consistent with embolus to that vessel.  No sign of brain infarction in the territory at this time however.  Aspects 10.   CT head wo 08/24/2016 :Right posterior inferior cerebellar artery distribution infarct involving majority of inferior aspect the right cerebellum with swelling and narrowing of the outlet of the fourth ventricle.Interval clearing of contrast enhancement involving right subinsular and operculum region. Now noted is an acute nonhemorrhagic infarct of the right subinsular/ peri operculum region.  PHYSICAL EXAM Frail elderly Caucasian lady who is intubated and not sedated. She has bilateral groin arterial sheaths. . Afebrile. Head is nontraumatic. Neck is supple without bruit.    Cardiac exam no murmur or gallop. Lungs are clear to auscultation. Distal pulses are well felt.  Neurological Exam : Intubated but not sedated. Awake alert and following commands quite well. Extraocular movements are full range without nystagmus. Decreased blink to threat on the left compared to the right. Pupils equal reactive. Fundi not visualized. No facial weakness. Tongue midline. Able to move all 4 extremities against gravity. Left lower extremity strength testing is limited due to arterial groin sheath. Plantars bilateral downgoing. Sensation appears preserved bilaterally. Gait was not tested.    ASSESSMENT/PLAN Ms. KATHARYNE BRANTLEY is a 83 y.o. female with history of atrial fibrillation (not anticoagulated), hypertension, and aortic  valvular disease presenting with right gaze deviation and left hemiparesis. She did not receive IV t-PA due to a recent embolectomy.  Stroke:  Non-dominant  Infarct - embolic secondary to atrial fibrillation.  Resultant  Mild left homonymous hemianopia  MRI - not performed  MRA - not performed  CTA H&N - Embolic occlusion of the right MCA at the M1 segment.   CT Head - acute infarction at the inferior cerebellum on the right  Carotid Doppler - CTA neck 2D Echo - Left ventricle: The cavity size was normal. There was mild   concentric hypertrophy. Systolic function was vigorous. The   estimated ejection fraction was in the range of 65% to 70%. Wall   motion was normal; there were no regional wall motion    abnormalities.  LDL -94 mg   HgbA1c - pending  VTE prophylaxis - SCDs Diet Heart Room service appropriate? Yes; Fluid consistency: Thin  aspirin 81 mg daily prior to admission, now on aspirin 81 mg daily  Patient will be counseled to be compliant with her antithrombotic medications  Ongoing aggressive stroke risk factor management  Therapy recommendations: pending  Disposition:  Pending  Hypertension  Blood pressure tends to run low  Permissive hypertension (OK if < 220/120) but gradually normalize in 5-7 days  Long-term BP goal normotensive  Hyperlipidemia  Home meds: No blood pressure lowering medications prior to admission.  LDL 94 mg %, goal < 70 start lipitor 20 mg   Other Stroke Risk Factors  Advanced age  Atrial fibrillation not anticoagulated    Other Active Problems  Status post cerebral angiogram and complete revascularization (TICI 3) of right MCA occlusion  S/P embolectomy left upper extremity - 08/23/2016 - Dr. Oneida Alar  Mild anemia  Mild leukocytosis  Ectatic ascending aorta, transverse diameter up to 4.1 cm.  Atrial fibrillation not anticoagulated prior to admission  PLAN  Repeat Head CT tomorrow  IV Heparin anticoagulation   And will start eliquis in few days if cerebellar infarct is stable.  Hospital day # 2    I have personally examined this patient, reviewed notes, independently viewed imaging studies, participated in medical decision making and plan of care.ROS completed by me personally and pertinent positives fully documented  I have made any additions or clarifications directly to the above note. Agree with note above. Remarkable elderly lady who presented with ischemic left arm secondary to brachial artery emboli and underwent successful embolectomy but developed right MCA clot requiring emergent mechanical embolectomy would good recanalization. She seems to be doing well with only mild left-sided peripheral vision loss. Plan repeat CT scan of the head tomorrow. No family available at the bedside for discussion. Discussed with Dr. Ashok Cordia from pulmonary critical care medicine and Dr. Oneida Alar from vascular surgery This patient is critically ill and at significant risk of neurological worsening, death and care requires constant monitoring of vital signs, hemodynamics,respiratory and cardiac monitoring, extensive review of multiple databases, frequent neurological assessment, discussion with family, other specialists and medical decision making of high complexity.I have made any additions or clarifications directly to the above note.This critical care time does not reflect procedure time, or teaching time or supervisory time of PA/NP/Med Resident etc but could involve care discussion time.  I spent 30 minutes of neurocritical care time  in the care of  this patient.      Antony Contras, MD Medical Director [facility] Stroke Center Pager: [phone] 08/25/2016 1:42 PM   08/25/2016, 1:42 PM   To contact Stroke Continuity provider, please refer to [email or web address] After hours, contact General Neurology

## 2016-08-25 NOTE — Progress Notes (Signed)
Physical Therapy Evaluation Patient Details Name: Christine Pugh MRN: [id number] DOB: Nov 15, 1921 Today's Date: 08/25/2016   History of Present Illness  This 83 y.o. female admitted with acute onset of Lt UE paralysis.  CT of head revealed Lt cerebellar subacute infarct and CT of UE showed Lt axillary embolus.  She was transferred to [facility] and underwent Lt brachial radial ulnar axillary embolectomy.  Pt developed neuro decline post up.  Repeat head CT old Lt cerebellar infarct, subacute Rt cerebellar infarct and Rt MCA clot visible - likely due to A-Fib.   She underwent cerebral thrombectomy and complete revascularization.   She was extubated 08/24/16.  PMH includes:  Atypical a-flutter, aortic stenosis, h/o 3 falls in 18 mos,   Clinical Impression  Patient presents with generalized weakness, dyspnea on exertion, impaired cardiovascular endurance, imbalance and impaired mobility s/p above. Tolerated gait training with Mod A of 2 for sequencing, balance and safety. Sp02 83% on RA and dropped to 87% on 2L/min 02. Pt independent PTA and uses SPC for ambulation. Pt reports feeling very fatigued this afternoon. Pt is a high fall risk and not safe to return home at this time. Will have a caregiver at d/c. Recommend CIR. Will follow acutely to maximize independence and mobility prior to return home.    Follow Up Recommendations CIR;Supervision/Assistance - 24 hour    Equipment Recommendations  Other (comment) (TBA)    Recommendations for Other Services Rehab consult     Precautions / Restrictions Precautions Precautions: Fall Restrictions Weight Bearing Restrictions: No      Mobility  Bed Mobility Overal bed mobility: Needs Assistance Bed Mobility: Supine to Sit;Sit to Supine     Supine to sit: Min assist Sit to supine: Min assist   General bed mobility comments: assist to initiate activity and assist to move LEs off and on bed   Transfers Overall transfer level: Needs  assistance Equipment used: 2 person hand held assist Transfers: Sit to/from [facility] Sit to Stand: Mod assist;+2 physical assistance Stand pivot transfers: Mod assist;+2 physical assistance       General transfer comment: Pt requires assist to move into standing and assist for balance and to help pivot during transfer   Ambulation/Gait Ambulation/Gait assistance: Mod assist;+2 physical assistance;+2 safety/equipment Ambulation Distance (Feet): 20 Feet (x2 bouts) Assistive device: 2 person hand held assist Gait Pattern/deviations: Step-to pattern;Step-through pattern;Decreased step length - right;Decreased step length - left;Shuffle;Trunk flexed Gait velocity: decreased Gait velocity interpretation: <1.8 ft/sec, indicative of risk for recurrent falls General Gait Details: Pt with slow, unsteady gait with cues to progress LEs; 2/4 DOE. Sp02 dropped to 87% on 2L/min 02. Cues for pursed lip breathing. Leaning heavily right/left.  Stairs            Wheelchair Mobility    Modified Rankin (Stroke Patients Only) Modified Rankin (Stroke Patients Only) Pre-Morbid Rankin Score: Slight disability Modified Rankin: Moderately severe disability     Balance Overall balance assessment: Needs assistance Sitting-balance support: Feet supported Sitting balance-Leahy Scale: Fair     Standing balance support: Bilateral upper extremity supported Standing balance-Leahy Scale: Poor Standing balance comment: Pt requires bil. UE support and mod A                              Pertinent Vitals/Pain Pain Assessment: No/denies pain    Home Living Family/patient expects to be discharged to:: Inpatient rehab Living Arrangements: Alone Available Help at Discharge: Family;Available PRN/intermittently  Type of Home: Apartment Home Access: Level entry     Home Layout: One level Home Equipment: Tub bench;Walker - 2 wheels;Cane - single point;Toilet riser       Prior Function Level of Independence: Independent with assistive device(s)         Comments: Uses SPC for ambulation; has cleaning lady. Drives. Per chart, pt with h/o falls, but she denies this during eval      Hand Dominance   Dominant Hand: Right    Extremity/Trunk Assessment   Upper Extremity Assessment Upper Extremity Assessment: Defer to OT evaluation    Lower Extremity Assessment Lower Extremity Assessment: Generalized weakness    Cervical / Trunk Assessment Cervical / Trunk Assessment: Kyphotic  Communication   Communication: HOH  Cognition Arousal/Alertness: Awake/alert Behavior During Therapy: WFL for tasks assessed/performed Overall Cognitive Status: Impaired/Different from baseline Area of Impairment: Attention;Following commands;Problem solving   Current Attention Level: Selective   Following Commands: Follows multi-step commands with increased time (and verbal cues )     Problem Solving: Difficulty sequencing;Requires verbal cues;Requires tactile cues General Comments: Pt required min cues for safety while ambulating into bathroom.  She demonstrated difficulty sequencing task in novel environment.  Requires instructions to be repeated several times - HOH may be contributing     General Comments General comments (skin integrity, edema, etc.): 02 sats on RA dropped to 87% on 2L/min 02 after ambulating to bathroom. 83% on RA.    Exercises     Assessment/Plan    PT Assessment Patient needs continued PT services  PT Problem List Decreased strength;Decreased activity tolerance;Decreased balance;Decreased mobility;Decreased cognition;Decreased safety awareness;Cardiopulmonary status limiting activity          PT Treatment Interventions Gait training;Functional mobility training;Therapeutic activities;Therapeutic exercise;Balance training;Neuromuscular re-education;Patient/family education;Cognitive remediation;DME instruction    PT Goals (Current goals  can be found in the Care Plan section)  Acute Rehab PT Goals Patient Stated Goal: to go home  PT Goal Formulation: With patient Time For Goal Achievement: 09/08/16 Potential to Achieve Goals: Good    Frequency Min 3X/week   Barriers to discharge Decreased caregiver support lives alone    Co-evaluation PT/OT/SLP Co-Evaluation/Treatment: Yes Reason for Co-Treatment: For patient/therapist safety;To address functional/ADL transfers;Complexity of the patient's impairments (multi-system involvement) PT goals addressed during session: Mobility/safety with mobility;Balance;Strengthening/ROM OT goals addressed during session: ADL's and self-care       End of Session Equipment Utilized During Treatment: Gait belt;Oxygen Activity Tolerance: Treatment limited secondary to medical complications (Comment);Patient limited by fatigue (02 saturations) Patient left: in bed;with call bell/phone within reach;with bed alarm set;with nursing/sitter in room Nurse Communication: Mobility status;Other (comment) (Sp02 to nursing student)         Time: [id number] PT Time Calculation (min) (ACUTE ONLY): 22 min   Charges:   PT Evaluation $PT Eval Moderate Complexity: 1 Procedure     PT G Codes:        Lisett Dirusso A Brittnei Jagiello 08/25/2016, 3:42 PM Wray Kearns, [place], DPT [phone]

## 2016-08-25 NOTE — Progress Notes (Signed)
ANTICOAGULATION CONSULT NOTE - Follow Up Consult  Pharmacy Consult for Heparin  Indication: atrial fibrillation, DVT and stroke  No Known Allergies  Patient Measurements: Height: 5\' 3"  (160 cm) Weight: 143 lb 8.3 oz (65.1 kg) IBW/kg (Calculated) : 52.4  Vital Signs: Temp: 98.1 F (36.7 C) (01/23 0400) Temp Source: Oral (01/23 0400) BP: 98/52 (01/23 0400) Pulse Rate: 63 (01/23 0400)  Labs:  Recent Labs  08/23/16 0354 08/24/16 0600 08/25/16 0331  HGB 13.2 11.2* 10.1*  HCT 39.6 34.2* 30.7*  PLT 199 181 158  APTT 30  --   --   LABPROT 13.2  --   --   INR 1.00  --   --   HEPARINUNFRC  --   --  0.15*  CREATININE 0.94 0.71 0.69  TROPONINI <0.03  --   --     Estimated Creatinine Clearance (by C-G formula based on SCr of 0.69 mg/dL) Female: 39.9 mL/min Female: 46.4 mL/min  Assessment: 83 yo female s/p CVA, s/p embolectomy of LUE, and hx afib not on anticoagulation PTA  1/23: heparin level low this AM  Goal of Therapy:  Heparin level 0.3-0.5 units/mL Monitor platelets by anticoagulation protocol: Yes   Plan:  Inc heparin drip to 850 units/hr 1300 HL Daily CBC/HL Monitor for bleeding   Narda Bonds 08/25/2016,4:25 AM

## 2016-08-25 NOTE — Progress Notes (Signed)
Speech Language Pathology Treatment: Dysphagia  Patient Details Name: Christine Pugh MRN: [id number] DOB: 1921/09/08 Today's Date: 08/25/2016 Time: [id number] SLP Time Calculation (min) (ACUTE ONLY): 15 min  Assessment / Plan / Recommendation Clinical Impression  Skilled treatment session focused on dysphagia goals. SLP facilitated session by providing skilled observation of regular lunch try with thin liquids. Pt demonstrated very mild dyspnea but no other overt s/s of aspiration. Nursing was consulted and doesn't report any overt s/s of aspiration with consumption. Of note, pt's speech appears intelligible at this time and she is able to functionally retain and express information. She effectively navigates her cell phone and iPad. Will sign off for now and can re-consult if pt's condition changes.    HPI HPI: 25F admitted with acute LUE thrombosis (axillary), subacute cerebellar infarct and acute R MCA occlusion post-procedure, now status post IR mechanical thrombectomy. PMH significant for a fib/flutter not on a/c, aortic stenosis, HTN, hypothyroidism.      SLP Plan  All goals met     Recommendations  Diet recommendations: Regular;Thin liquid Liquids provided via: Straw;Cup Medication Administration: Whole meds with liquid Supervision: Patient able to self feed Compensations: Slow rate;Small sips/bites Postural Changes and/or Swallow Maneuvers: Seated upright 90 degrees                Oral Care Recommendations: Oral care BID Follow up Recommendations:  (TBD) Plan: All goals met       GO              Christine Pugh Christine Pugh, M.S., CCC-SLP Speech-Language Pathologist   Rodolfo Notaro 08/25/2016, 3:20 PM

## 2016-08-25 NOTE — Progress Notes (Addendum)
  Progress Note    08/25/2016 9:54 AM 2 Days Post-Op  Subjective:  Wants to get up to go to the bathroom  Afebrile HR  60's-70's NSR [id number] systolic [id number]  Vitals:   08/25/16 0700 08/25/16 0800  BP: 114/61   Pulse: 69   Resp: (!) 22   Temp:  97.7 F (36.5 C)    Physical Exam: Cardiac:  regular Lungs:  Non labored Incisions:  Clean and dry; left arm about the same as yesterday Extremities:  Easily palpable left radial pulse Neuro:  In tact moving all extremities   CBC    Component Value Date/Time   WBC 10.6 (H) 08/25/2016 0331   RBC 3.15 (L) 08/25/2016 0331   HGB 10.1 (L) 08/25/2016 0331   HCT 30.7 (L) 08/25/2016 0331   PLT 158 08/25/2016 0331   MCV 97.5 08/25/2016 0331   MCH 32.1 08/25/2016 0331   MCHC 32.9 08/25/2016 0331   RDW 14.5 08/25/2016 0331   LYMPHSABS 1.2 08/25/2016 0331   MONOABS 1.0 08/25/2016 0331   EOSABS 0.2 08/25/2016 0331   BASOSABS 0.0 08/25/2016 0331    BMET    Component Value Date/Time   NA 140 08/25/2016 0331   K 3.3 (L) 08/25/2016 0331   CL 109 08/25/2016 0331   CO2 25 08/25/2016 0331   GLUCOSE 113 (H) 08/25/2016 0331   BUN 9 08/25/2016 0331   CREATININE 0.69 08/25/2016 0331   CALCIUM 8.0 (L) 08/25/2016 0331   GFRNONAA >60 08/25/2016 0331   GFRAA >60 08/25/2016 0331    INR    Component Value Date/Time   INR 1.00 08/23/2016 0354     Intake/Output Summary (Last 24 hours) at 08/25/16 0954 Last data filed at 08/25/16 0640  Gross per 24 hour  Intake          1815.63 ml  Output              575 ml  Net          1240.63 ml   CTA head 08/24/16 IMPRESSION: Right posterior inferior cerebellar artery distribution infarct involving majority of inferior aspect the right cerebellum with swelling and narrowing of the outlet of the fourth ventricle. This will need to be monitored closely as the patient may be at risk for development of hydrocephalus. Question tiny petechial hemorrhage along the periphery of this  infarct.  Interval clearing of contrast enhancement involving right subinsular and operculum region. Now noted is an acute nonhemorrhagic infarct of the right subinsular/ peri operculum region.  Speech evaluation 08/24/16: Regular, thin liquid   Assessment:  83 y.o. female is s/p:  Left brachial radial ulnar axillary embolectomy  2 Days Post-Op  Plan: -pt doing remarkably well this morning sitting up eating breakfast -neuro in tact -DVT prophylaxis:  Heparin gtt started--CT of head tomorrow and if no further changes, may start Eliquis per neuro.  Will ask case management to see pt for financial assistance. -PT/OT  -transfer to [place], [place] [facility] [phone] 08/25/2016 9:54 AM   Agree with above.  Transfer 2w CT head tomorrow Eliquis when ok with Neurology OOB ambulate  Ruta Hinds, MD [facility] [place] Office: [phone] Pager: [phone]

## 2016-08-25 NOTE — Progress Notes (Signed)
ANTICOAGULATION CONSULT NOTE - Follow Up Consult  Pharmacy Consult for Heparin  Indication: atrial fibrillation, DVT and stroke  No Known Allergies  Patient Measurements: Height: 5\' 3"  (160 cm) Weight: 143 lb 8.3 oz (65.1 kg) IBW/kg (Calculated) : 52.4  Vital Signs: Temp: 98 F (36.7 C) (01/23 2053) Temp Source: Oral (01/23 2053) BP: 108/48 (01/23 2053) Pulse Rate: 71 (01/23 2053)  Labs:  Recent Labs  08/23/16 0354 08/24/16 0600 08/25/16 0331 08/25/16 1411 08/25/16 2149  HGB 13.2 11.2* 10.1*  --   --   HCT 39.6 34.2* 30.7*  --   --   PLT 199 181 158  --   --   APTT 30  --   --   --   --   LABPROT 13.2  --   --   --   --   INR 1.00  --   --   --   --   HEPARINUNFRC  --   --  0.15* 0.36 0.33  CREATININE 0.94 0.71 0.69  --   --   TROPONINI <0.03  --   --   --   --     Estimated Creatinine Clearance (by C-G formula based on SCr of 0.69 mg/dL) Female: 39.9 mL/min Female: 46.4 mL/min  Medications: Heparin @ 850 units/hr  Assessment: 83yo female s/p CVA, s/p embolectomy of LUE DVT, and with hx afib continues on heparin with plans to transition to eliquis in a few days if cerebellar infarct is stable. Heparin level is therapeutic at 0.33.   Goal of Therapy:  Heparin level 0.3-0.5 units/mL Monitor platelets by anticoagulation protocol: Yes   Plan:  1) Continue heparin at 850 units/hr 2) Daily HL, CBC  Bonnita Nasuti Pharm.D. CPP, BCPS Clinical Pharmacist [phone] 08/25/2016 10:27 PM

## 2016-08-26 ENCOUNTER — Inpatient Hospital Stay (HOSPITAL_COMMUNITY): Payer: Medicare Other

## 2016-08-26 DIAGNOSIS — I69398 Other sequelae of cerebral infarction: Secondary | ICD-10-CM

## 2016-08-26 DIAGNOSIS — R414 Neurologic neglect syndrome: Secondary | ICD-10-CM

## 2016-08-26 DIAGNOSIS — R209 Unspecified disturbances of skin sensation: Secondary | ICD-10-CM

## 2016-08-26 DIAGNOSIS — R269 Unspecified abnormalities of gait and mobility: Secondary | ICD-10-CM

## 2016-08-26 LAB — RENAL FUNCTION PANEL
ANION GAP: 5 (ref 5–15)
Albumin: 2.4 g/dL — ABNORMAL LOW (ref 3.5–5.0)
BUN: 9 mg/dL (ref 6–20)
CHLORIDE: 109 mmol/L (ref 101–111)
CO2: 26 mmol/L (ref 22–32)
Calcium: 8.1 mg/dL — ABNORMAL LOW (ref 8.9–10.3)
Creatinine, Ser: 0.56 mg/dL (ref 0.44–1.00)
GFR calc non Af Amer: >60 mL/min (ref >60)
Glucose, Bld: 123 mg/dL — ABNORMAL HIGH (ref 65–99)
Phosphorus: 1.6 mg/dL — ABNORMAL LOW (ref 2.5–4.6)
Potassium: 3 mmol/L — ABNORMAL LOW (ref 3.5–5.1)
Sodium: 140 mmol/L (ref 135–145)

## 2016-08-26 LAB — CBC WITH DIFFERENTIAL/PLATELET
Basophils Absolute: 0 10*3/uL (ref 0.0–0.1)
Basophils Relative: 0 %
Eosinophils Absolute: 0.5 10*3/uL (ref 0.0–0.7)
Eosinophils Relative: 4 %
HCT: 32.9 % — ABNORMAL LOW (ref 36.0–46.0)
HEMOGLOBIN: 10.7 g/dL — AB (ref 12.0–15.0)
LYMPHS ABS: 1.2 10*3/uL (ref 0.7–4.0)
LYMPHS PCT: 11 %
MCH: 31.8 pg (ref 26.0–34.0)
MCHC: 32.5 g/dL (ref 30.0–36.0)
MCV: 97.6 fL (ref 78.0–100.0)
Monocytes Absolute: 1 10*3/uL (ref 0.1–1.0)
Monocytes Relative: 9 %
NEUTROS PCT: 76 %
Neutro Abs: 8.5 10*3/uL — ABNORMAL HIGH (ref 1.7–7.7)
Platelets: 172 10*3/uL (ref 150–400)
RBC: 3.37 MIL/uL — AB (ref 3.87–5.11)
RDW: 14.3 % (ref 11.5–15.5)
WBC: 11.2 10*3/uL — AB (ref 4.0–10.5)

## 2016-08-26 LAB — HEPARIN LEVEL (UNFRACTIONATED)
Heparin Unfractionated: 0.17 IU/mL — ABNORMAL LOW (ref 0.30–0.70)
Heparin Unfractionated: 0.4 IU/mL (ref 0.30–0.70)
Heparin Unfractionated: 0.56 IU/mL (ref 0.30–0.70)

## 2016-08-26 LAB — HEMOGLOBIN A1C
Hgb A1c MFr Bld: 5.7 % — ABNORMAL HIGH (ref 4.8–5.6)
MEAN PLASMA GLUCOSE: 117 mg/dL

## 2016-08-26 LAB — MAGNESIUM: MAGNESIUM: 1.9 mg/dL (ref 1.7–2.4)

## 2016-08-26 MED ORDER — IPRATROPIUM-ALBUTEROL 0.5-2.5 (3) MG/3ML IN SOLN
3.0000 mL | Freq: Four times a day (QID) | RESPIRATORY_TRACT | Status: DC
  Administered 2016-08-26 (×4): 3 mL via RESPIRATORY_TRACT
  Filled 2016-08-26 (×4): qty 3

## 2016-08-26 MED ORDER — HYDROCORTISONE 1 % EX CREA
TOPICAL_CREAM | Freq: Three times a day (TID) | CUTANEOUS | Status: DC
  Administered 2016-08-26 – 2016-08-28 (×6): via TOPICAL
  Filled 2016-08-26: qty 28

## 2016-08-26 MED ORDER — DIPHENHYDRAMINE HCL 25 MG PO CAPS
25.0000 mg | ORAL_CAPSULE | Freq: Three times a day (TID) | ORAL | Status: DC | PRN
  Administered 2016-08-26 – 2016-08-27 (×2): 25 mg via ORAL
  Filled 2016-08-26 (×2): qty 1

## 2016-08-26 MED ORDER — POTASSIUM CHLORIDE ER 10 MEQ PO TBCR
40.0000 meq | EXTENDED_RELEASE_TABLET | ORAL | Status: AC
Start: 2016-08-26 — End: 2016-08-26
  Administered 2016-08-26 (×2): 40 meq via ORAL
  Filled 2016-08-26 (×4): qty 4

## 2016-08-26 NOTE — Progress Notes (Signed)
Patient having expiratory wheezes. Repositioned in bed. Paged Dr Trula Slade and received verbal order for breathing treatment. Will continue to monitor patient, call light within reach

## 2016-08-26 NOTE — Progress Notes (Signed)
Heparin has been increased to 10, stopped NS at 15 due to increase and wheezing.  Breathing treatment has helped but patient still having some wheezing. Will continue to monitor. Bed alarm on, call light within reach.

## 2016-08-26 NOTE — Progress Notes (Signed)
STROKE TEAM PROGRESS NOTE   HISTORY OF PRESENT ILLNESS (per record) Christine Pugh is a 83 y.o. female. transferred from outside hospital due to subacute right cerebellar infarct on CT early this am and decreased vascular perfusion of the left arm due to brachial artery emboli.  She has paroxysmal a. Fib.  She underwent uneventful embolectomy today and was last seen normal at 1 pm.  At 2 pm, RN saw patient with mental status changes, right gaze deviation, and left hemiparesis.  CT Brain  Shows no hemorrhage, but old left cerebellar infarct and subacute right cerebellar infarct and right MCA clot visible.  I see hypodensity in the right frontal and parietal areas, but radiologist does not confirm that.  CTA Brain and Neck with perfusion studies were ordered in anticipation of mechanical thrombectomy.  She is not an ideal candidate for IV tPA given recent surgery.     SUBJECTIVE (INTERVAL HISTORY) No family members present. but spoke to son over the phone. The patient is   alert  . No focal deficits noted except for a left visual field cut.Blood pressure adequately controlled.   OBJECTIVE Temp:  [97.6 F (36.4 C)-98 F (36.7 C)] 97.6 F (36.4 C) (01/24 0411) Pulse Rate:  [71-95] 72 (01/24 0430) Cardiac Rhythm: Heart block (01/24 0700) Resp:  [20-22] 22 (01/24 0430) BP: (108-124)/(48-51) 124/51 (01/24 0411) SpO2:  [77 %-96 %] 77 % (01/24 0946)  CBC:   Recent Labs Lab 08/25/16 0331 08/26/16 0347  WBC 10.6* 11.2*  NEUTROABS 8.2* 8.5*  HGB 10.1* 10.7*  HCT 30.7* 32.9*  MCV 97.5 97.6  PLT 158 [id number]    Basic Metabolic Panel:   Recent Labs Lab 08/25/16 0331 08/26/16 0347  NA 140 140  K 3.3* 3.0*  CL 109 109  CO2 25 26  GLUCOSE 113* 123*  BUN 9 9  CREATININE 0.69 0.56  CALCIUM 8.0* 8.1*  MG 1.7 1.9  PHOS 2.0* 1.6*    Lipid Panel:     Component Value Date/Time   CHOL 164 08/25/2016 0331   TRIG 69 08/25/2016 0331   HDL 56 08/25/2016 0331   CHOLHDL 2.9 08/25/2016 0331   VLDL 14 08/25/2016 0331   LDLCALC 94 08/25/2016 0331   HgbA1c:  Lab Results  Component Value Date   HGBA1C 5.7 (H) 08/25/2016   Urine Drug Screen:     Component Value Date/Time   LABOPIA NONE DETECTED 08/24/2016 1648   COCAINSCRNUR NONE DETECTED 08/24/2016 1648   LABBENZ POSITIVE (A) 08/24/2016 1648   AMPHETMU NONE DETECTED 08/24/2016 1648   THCU NONE DETECTED 08/24/2016 1648   LABBARB NONE DETECTED 08/24/2016 1648      IMAGING  Ct Angio Head and Neck W Or Wo Contrast 08/23/2016 Atherosclerotic disease at both carotid bifurcations. No stenosis on the right. 40% stenosis of the proximal ICA on the left. Embolic occlusion of the right MCA at the M1 segment.  Ectatic ascending aorta, transverse diameter up to 4.1 cm.  Ascending aorta not completely imaged. 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  50% stenosis at both vertebral artery origins.    Ct Head Wo Contrast 08/23/2016 1. Contrast staining and mild gyral swelling involving the right insula and temporal lobe in the MCA territory.  Superimposed petechial hemorrhage also possible.  2. Subacute right cerebellar infarct.     Ct Head Wo Contrast 08/23/2016 Moderate size subacute infarct in the right inferior cerebellum.  Generalized atrophy and chronic small vessel ischemia.  Ct Angio Up Extrem Left W &/or Wo Contast 08/23/2016 Occlusion of the distal left axillary artery spanning 5 cm with distal reconstitution of the brachial artery in the mid proximal humerus.  Brachial arterial flow to the elbow is thready with small caliber vessels.      Ct Cerebral Perfusion W Contrast [id number] Embolic occlusion of the right M1 segment. Central blood flow less than 30% volume of 67 cc. T-max greater than 6 seconds 294 cc. Mismatch volume 227 cc. Confounding factors as discussed in the last paragraph above.     Ir Percutaneous Art Thrombectomy/infusion Intracranial Inc Diag Angio 08/23/2016 Status post cerebral  angiogram and complete revascularization (TICI 3) of right MCA occlusion with 3 passes of SOLUMBRA technique, using stentriever technology and local aspiration. Deployment of Exoseal for hemostasis.     Ct Head Code Stroke Wo Contrast 08/23/2016 Re- demonstration of acute infarction at the inferior cerebellum on the right.  Question involvement of the right cerebral peduncle and thalamus.  Newly seen hyperdense right MCA consistent with embolus to that vessel.  No sign of brain infarction in the territory at this time however.  Aspects 10.   CT head wo 08/24/2016 :Right posterior inferior cerebellar artery distribution infarct involving majority of inferior aspect the right cerebellum with swelling and narrowing of the outlet of the fourth ventricle.Interval clearing of contrast enhancement involving right subinsular and operculum region. Now noted is an acute nonhemorrhagic infarct of the right subinsular/ peri operculum region.  PHYSICAL EXAM Frail elderly Caucasian lady who is intubated and not sedated. She has bilateral groin arterial sheaths. . Afebrile. Head is nontraumatic. Neck is supple without bruit.    Cardiac exam no murmur or gallop. Lungs are clear to auscultation. Distal pulses are well felt.  Neurological Exam : Intubated but not sedated. Awake alert and following commands quite well. Extraocular movements are full range without nystagmus. Decreased blink to threat on the left compared to the right. Pupils equal reactive. Fundi not visualized. No facial weakness. Tongue midline. Able to move all 4 extremities against gravity. Left lower extremity strength testing is limited due to arterial groin sheath. Plantars bilateral downgoing. Sensation appears preserved bilaterally. Gait was not tested.    ASSESSMENT/PLAN [name] is a 83 y.o. female with history of atrial fibrillation (not anticoagulated), hypertension, and aortic valvular disease presenting with right gaze  deviation and left hemiparesis. She did not receive IV t-PA due to a recent embolectomy.  Stroke:  Non-dominant  Infarct - embolic secondary to atrial fibrillation.  Resultant  Mild left homonymous hemianopia  MRI - not performed  MRA - not performed  CTA H&N - Embolic occlusion of the right MCA at the M1 segment.   CT Head - acute infarction at the inferior cerebellum on the right  Carotid Doppler - CTA neck 2D Echo - Left ventricle: The cavity size was normal. There was mild   concentric hypertrophy. Systolic function was vigorous. The   estimated ejection fraction was in the range of 65% to 70%. Wall   motion was normal; there were no regional wall motion    abnormalities.  LDL -94 mg   HgbA1c - pending  VTE prophylaxis - SCDs Diet Heart Room service appropriate? Yes; Fluid consistency: Thin  aspirin 81 mg daily prior to admission, now on aspirin 81 mg daily  Patient will be counseled to be compliant with her antithrombotic medications  Ongoing aggressive stroke risk factor management  Therapy recommendations: pending  Disposition:  Pending  Hypertension  Blood pressure tends to run low  Permissive hypertension (OK if < 220/120) but gradually normalize in 5-7 days  Long-term BP goal normotensive  Hyperlipidemia  Home meds: No blood pressure lowering medications prior to admission.  LDL 94 mg %, goal < 70 start lipitor 20 mg   Other Stroke Risk Factors  Advanced age  Atrial fibrillation not anticoagulated    Other Active Problems  Status post cerebral angiogram and complete revascularization (TICI 3) of right MCA occlusion  S/P embolectomy left upper extremity - 08/23/2016 - Dr. Oneida Alar  Mild anemia  Mild leukocytosis  Ectatic ascending aorta, transverse diameter up to 4.1 cm.  Atrial fibrillation not anticoagulated prior to admission  PLAN  Repeat Head CT tomorrow  IV Heparin anticoagulation  And will start eliquis in few days if  cerebellar infarct is stable.  Hospital day # 3    I have personally examined this patient, reviewed notes, independently viewed imaging studies, participated in medical decision making and plan of care.ROS completed by me personally and pertinent positives fully documented  I have made any additions or clarifications directly to the above note. Recommend mobilize out of bed. Therapy and rehab consults. Change iv heparin to eliquis at discharge.Gretare than 50 % time during this 25 minute visit was spent on counselling and coordination of care about stroke, afib and answering questions.     Antony Contras, MD Medical Director [facility] Pager: [phone] 08/26/2016 1:06 PM   08/26/2016, 1:06 PM   To contact Stroke Continuity provider, please refer to [email or web address] After hours, contact General Neurology

## 2016-08-26 NOTE — Consult Note (Signed)
Physical Medicine and Rehabilitation Consult Reason for Consult: Right cerebellar infarct Referring Physician: Dr. Oneida Alar   HPI: Christine Pugh is a 83 y.o. right handed female with history of moderate to severe aortic stenosis, atypical atrial flutter with implanted loop recorder and refused anticoagulation in the past, hypertension, aortic insufficiency as well as report of 3 falls over the last 18 months. Per chart review patient lives alone independently with assistive device prior to admission. One level home. She has a son in the area but cannot provide 24-hour care. Presented 08/23/2016 with acute onset of paralysis left arm approximately 6 hours prior to admission. Cranial CT scan at [facility] showed subacute right cerebellar infarct as well as findings of left axillary embolus. Patient was transferred to [facility] and underwent left brachial radial ulnar axillary embolectomy for acute ischemic left arm 08/23/2016 per Dr. Oneida Alar. She did remain intubated 08/24/2016 Follow-up neurology services for mental status changes, right gaze deviation and left hemiparesthesias. CT angiogram of head and neck as well as CT perfusion scan showed 40% stenosis of the proximal ICA on the left. Embolic occlusion of the right MCA at the M1 segment. 50% stenosis of both vertebral artery origins. 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 She underwent mechanical thrombectomy per interventional radiology remained intubated for a short time. Echocardiogram completed 08/24/2016 showing ejection fraction [id number] grade 2 diastolic dysfunction. Neurology follow-up currently on heparin protocol and await plan for possible Eliquis. Occupational therapy evaluation completed with recommendations of physical medicine rehabilitation consult.  Patient states she no longer has numbness. Does not note any dizziness   Review of Systems  Constitutional: Negative for chills and fever.  HENT: Negative for ear  pain, hearing loss and tinnitus.   Eyes: Negative for blurred vision and double vision.  Respiratory: Negative for cough and shortness of breath.   Cardiovascular: Positive for palpitations and leg swelling.  Gastrointestinal: Positive for constipation. Negative for nausea and vomiting.  Musculoskeletal: Positive for falls, joint pain and myalgias.  Skin: Negative for rash.  Neurological: Positive for dizziness and weakness. Negative for headaches.  All other systems reviewed and are negative.  Past Medical History:  Diagnosis Date  . Aortic insufficiency    Mild  . Aortic stenosis    Moderate to severe  . Atypical atrial flutter ([place])   . Benign essential HTN   . Embolic stroke involving right cerebellar artery ([place])   . Hypothyroidism   . Ischemia of extremity    Past Surgical History:  Procedure Laterality Date  . APPENDECTOMY    . BREAST LUMPECTOMY    . dental implant    . EP IMPLANTABLE DEVICE N/A 01/10/2016   Procedure: Loop Recorder Insertion;  Surgeon: [name], MD;  Location: [place] [facility];  Service: Cardiovascular;  Laterality: N/A;  . IR GENERIC HISTORICAL  08/23/2016   IR US GUIDE VASC ACCESS RIGHT 08/23/2016 Corrie Mckusick, DO [facility]  . IR GENERIC HISTORICAL  08/23/2016   IR PERCUTANEOUS ART THROMBECTOMY/INFUSION INTRACRANIAL INC DIAG ANGIO 08/23/2016 Corrie Mckusick, DO [facility]  . IR GENERIC HISTORICAL  08/23/2016   IR ANGIO INTRA EXTRACRAN SEL COM CAROTID INNOMINATE UNI L MOD SED 08/23/2016 Corrie Mckusick, DO [facility]  . RADIOLOGY WITH ANESTHESIA Bilateral 08/23/2016   Procedure: RADIOLOGY WITH ANESTHESIA;  Surgeon: Medication Radiologist, MD;  Location: [place];  Service: Radiology;  Laterality: Bilateral;  . THROMBECTOMY BRACHIAL ARTERY Left 08/23/2016   Procedure: Left Radial, Ulnar,  Brachial and Axillary Embolectomy;  Surgeon: Elam Dutch, MD;  Location: [place];  Service: Vascular;  Laterality: Left;  . THYROIDECTOMY    . TONSILLECTOMY    .  TOTAL ABDOMINAL HYSTERECTOMY    . VITRECTOMY AND CATARACT     Family History  Problem Relation Age of Onset  . Coronary artery disease     Social History:  reports that she has never smoked. She has never used smokeless tobacco. She reports that she does not drink alcohol or use drugs. Allergies: No Known Allergies Medications Prior to Admission  Medication Sig Dispense Refill  . amiodarone (PACERONE) 100 MG tablet Take 1 tablet (100 mg total) by mouth daily. (Patient taking differently: Take 50 mg by mouth daily. ) 90 tablet 3  . aspirin EC 81 MG tablet Take 81 mg by mouth daily.    . cyproheptadine (PERIACTIN) 4 MG tablet Take 4 mg by mouth 3 (three) times daily as needed for allergies. Reported on 08/27/2015    . levothyroxine (SYNTHROID, LEVOTHROID) 137 MCG tablet Take 112 mcg by mouth daily before breakfast.     . Lutein-Zeaxanthin 25-5 MG CAPS Take 1 capsule by mouth 2 (two) times daily.    . metoprolol succinate (TOPROL XL) 25 MG 24 hr tablet Take 1 tablet (25 mg total) by mouth 2 (two) times daily. 180 tablet 3  . Multiple Vitamin (MULTIVITAMIN) tablet Take 1 tablet by mouth daily.      Marland Kitchen triamcinolone cream (KENALOG) 0.1 % Apply 1 application topically daily as needed (irritated skin).     Marland Kitchen triamterene-hydrochlorothiazide (DYAZIDE) 37.5-25 MG per capsule Take 1 capsule by mouth daily.        Home: Home Living Family/patient expects to be discharged to:: Private residence Living Arrangements: Alone Available Help at Discharge: Family, Available PRN/intermittently Type of Home: Apartment Home Access: Level entry [place]: One level Bathroom Shower/Tub: Chiropodist: [place]: Tub bench, Environmental consultant - 2 wheels, Cane - single point, Toilet riser  Functional History: Prior Function Level of Independence: Independent with assistive device(s) Comments: Uses SPC for ambulation; has cleaning lady. Drives. Per chart, pt with h/o falls, but she denies  this during eval  Functional Status:  Mobility: Bed Mobility Overal bed mobility: Needs Assistance Bed Mobility: Supine to Sit, Sit to Supine Supine to sit: Min assist Sit to supine: Min assist General bed mobility comments: assist to initiate activity and assist to move LEs off and on bed  Transfers Overall transfer level: Needs assistance Equipment used: 2 person hand held assist Transfers: Sit to/from Stand, Stand Pivot Transfers Sit to Stand: Mod assist, +2 physical assistance Stand pivot transfers: Mod assist, +2 physical assistance General transfer comment: Pt requires assist to move into standing and assist for balance and to help pivot during transfer  Ambulation/Gait Ambulation/Gait assistance: Mod assist, +2 physical assistance, +2 safety/equipment Ambulation Distance (Feet): 20 Feet (x2 bouts) Assistive device: 2 person hand held assist Gait Pattern/deviations: Step-to pattern, Step-through pattern, Decreased step length - right, Decreased step length - left, Shuffle, Trunk flexed General Gait Details: Pt with slow, unsteady gait with cues to progress LEs; 2/4 DOE. Sp02 dropped to 87% on 2L/min 02. Cues for pursed lip breathing. Leaning heavily right/left. Gait velocity: decreased Gait velocity interpretation: <1.8 ft/sec, indicative of risk for recurrent falls    ADL: ADL Overall ADL's : Needs assistance/impaired Eating/Feeding: Set up, Bed level Grooming: Wash/dry hands, Wash/dry face, Oral care, Brushing hair, Moderate assistance, Standing Upper Body Bathing:  Set up, Supervision/ safety, Sitting Lower Body Bathing: Maximal assistance, Sit to/from stand Upper Body Dressing : Minimal assistance, Sitting Lower Body Dressing: Total assistance, Sit to/from stand Toilet Transfer: Moderate assistance, +2 for physical assistance, Ambulation, Comfort height toilet Toilet Transfer Details (indicate cue type and reason): requires assist to move into standing and assist for  balance.  Toileting- Clothing Manipulation and Hygiene: Total assistance, Sit to/from stand Toileting - Clothing Manipulation Details (indicate cue type and reason): She is able to assist minimally with pulling underpants over left hip  Functional mobility during ADLs: Moderate assistance, +2 for physical assistance  Cognition: Cognition Overall Cognitive Status: Impaired/Different from baseline Orientation Level: Oriented X4 Cognition Arousal/Alertness: Awake/alert Behavior During Therapy: WFL for tasks assessed/performed Overall Cognitive Status: Impaired/Different from baseline Area of Impairment: Attention, Following commands, Problem solving Current Attention Level: Selective Following Commands: Follows multi-step commands with increased time (and verbal cues ) Problem Solving: Difficulty sequencing, Requires verbal cues, Requires tactile cues General Comments: Pt required min cues for safety while ambulating into bathroom.  She demonstrated difficulty sequencing task in novel environment.  Requires instructions to be repeated several times - HOH may be contributing   Blood pressure (!) 124/51, pulse 72, temperature 97.6 F (36.4 C), temperature source Oral, resp. rate (!) 22, height 5\' 3"  (1.6 m), weight 65.1 kg (143 lb 8.3 oz), SpO2 (!) 77 %. Physical Exam  Vitals reviewed. Constitutional: She is oriented to person, place, and time.  HENT:  Head: Normocephalic.  Eyes:  Pupils reactive to light  Neck: Normal range of motion. Neck supple. No thyromegaly present.  Cardiovascular:  Cardiac rate controlled  Respiratory: Effort normal and breath sounds normal. No respiratory distress.  GI: Soft. Bowel sounds are normal. She exhibits no distension.  Neurological: She is alert and oriented to person, place, and time.  Skin:  Left arm with severe bruising ecchymosis ischemic changes  motor strength 4 plus bilateral deltoid, biceps, triceps, grip 4 minus bilateral hip flexor, knee  extensor, ankle dorsal flexor. Sensation absent to light touch and proprioception and deep touch in the left upper limb. Evidence of left neglect on confrontation testing  Results for orders placed or performed during the hospital encounter of 08/23/16 (from the past 24 hour(s))  Heparin level (unfractionated)     Status: None   Collection Time: 08/25/16  2:11 PM  Result Value Ref Range   Heparin Unfractionated 0.36 0.30 - 0.70 IU/mL  Heparin level (unfractionated)     Status: None   Collection Time: 08/25/16  9:49 PM  Result Value Ref Range   Heparin Unfractionated 0.33 0.30 - 0.70 IU/mL  Renal function panel     Status: Abnormal   Collection Time: 08/26/16  3:47 AM  Result Value Ref Range   Sodium 140 135 - 145 mmol/L   Potassium 3.0 (L) 3.5 - 5.1 mmol/L   Chloride 109 101 - 111 mmol/L   CO2 26 22 - 32 mmol/L   Glucose, Bld 123 (H) 65 - 99 mg/dL   BUN 9 6 - 20 mg/dL   Creatinine, Ser 0.56 0.44 - 1.00 mg/dL   Calcium 8.1 (L) 8.9 - 10.3 mg/dL   Phosphorus 1.6 (L) 2.5 - 4.6 mg/dL   Albumin 2.4 (L) 3.5 - 5.0 g/dL   GFR calc non Af Amer >60 >60 mL/min   GFR calc Af Amer >60 >60 mL/min   Anion gap 5 5 - 15  Heparin level (unfractionated)     Status: Abnormal   Collection Time:  08/26/16  3:47 AM  Result Value Ref Range   Heparin Unfractionated 0.17 (L) 0.30 - 0.70 IU/mL  CBC with Differential/Platelet     Status: Abnormal   Collection Time: 08/26/16  3:47 AM  Result Value Ref Range   WBC 11.2 (H) 4.0 - 10.5 K/uL   RBC 3.37 (L) 3.87 - 5.11 MIL/uL   Hemoglobin 10.7 (L) 12.0 - 15.0 g/dL   HCT 32.9 (L) 36.0 - 46.0 %   MCV 97.6 78.0 - 100.0 fL   MCH 31.8 26.0 - 34.0 pg   MCHC 32.5 30.0 - 36.0 g/dL   RDW 14.3 11.5 - 15.5 %   Platelets 172 150 - 400 K/uL   Neutrophils Relative % 76 %   Neutro Abs 8.5 (H) 1.7 - 7.7 K/uL   Lymphocytes Relative 11 %   Lymphs Abs 1.2 0.7 - 4.0 K/uL   Monocytes Relative 9 %   Monocytes Absolute 1.0 0.1 - 1.0 K/uL   Eosinophils Relative 4 %    Eosinophils Absolute 0.5 0.0 - 0.7 K/uL   Basophils Relative 0 %   Basophils Absolute 0.0 0.0 - 0.1 K/uL  Magnesium     Status: None   Collection Time: 08/26/16  3:47 AM  Result Value Ref Range   Magnesium 1.9 1.7 - 2.4 mg/dL   Ct Head Wo Contrast  Result Date: 08/26/2016 CLINICAL DATA:  Recent infarction, followup EXAM: CT HEAD WITHOUT CONTRAST TECHNIQUE: Contiguous axial images were obtained from the base of the skull through the vertex without intravenous contrast. COMPARISON:  CT brain scan of 08/24/2016 FINDINGS: Brain: Evolutionary changes of the previously noted right cerebellar infarct are noted. No acute hemorrhage is seen. The fourth ventricle remains patent, and there is no present evidence of developing hydrocephalus. However, there is poor defined low-attenuation within the distribution of the right middle cerebral artery as well as in the right insular ribbon, suspicious for edema from recent infarcts as well. If more sensitive assessment is warranted, MRI of the brain would be recommended. No hemorrhage is seen. The septum is minimally to the left of midline by 2 mm. Diffuse changes of atrophy. Again are noted as are moderate small vessel ischemic changes throughout the periventricular white matter. Vascular: No vascular abnormality is seen on this unenhanced study. Skull: On bone window images, no acute calvarial abnormality is noted. Sinuses/Orbits: The paranasal sinuses are well pneumatized. Other: None. IMPRESSION: 1. Evolutionary changes of previously noted right cerebellar infarct. No evidence of developing hydrocephalus. No acute hemorrhage. 2. Probable interval edema within the right middle cerebral artery distribution including the right insular ribbon suspicious for recent foci of infarction as well. Consider MRI for further assessment. 3. Slight deviation of the septum to the left by 2 mm. Electronically Signed   By: Ivar Drape M.D.   On: 08/26/2016 08:13   Ct Head Wo  Contrast  Result Date: 08/24/2016 CLINICAL DATA:  83 year old female with right middle cerebral artery occlusion post revascularization. Subsequent encounter. EXAM: CT HEAD WITHOUT CONTRAST TECHNIQUE: Contiguous axial images were obtained from the base of the skull through the vertex without intravenous contrast. COMPARISON:  08/23/2016 head CT. FINDINGS: Brain: Right posterior inferior cerebellar artery distribution infarct involving majority of inferior aspect the right cerebellum with swelling and narrowing of the outlet of the fourth ventricle. This will need to be monitored closely as the patient may be at risk for development of hydrocephalus. Question tiny petechial hemorrhage along the periphery of this infarct. Interval clearing of contrast enhancement  involving right subinsular and operculum region. Now noted is an acute nonhemorrhagic infarct of the right subinsular/ peri operculum region. Global atrophy without hydrocephalus. No intracranial mass lesion noted on this unenhanced exam. Vascular: Vascular calcifications. Skull: No acute abnormality. Sinuses/Orbits: No acute orbital abnormality post lens replacement. Mild mucosal thickening sphenoid sinus bilaterally. Other: None. IMPRESSION: Right posterior inferior cerebellar artery distribution infarct involving majority of inferior aspect the right cerebellum with swelling and narrowing of the outlet of the fourth ventricle. This will need to be monitored closely as the patient may be at risk for development of hydrocephalus. Question tiny petechial hemorrhage along the periphery of this infarct. Interval clearing of contrast enhancement involving right subinsular and operculum region. Now noted is an acute nonhemorrhagic infarct of the right subinsular/ peri operculum region. Electronically Signed   By: Genia Del M.D.   On: 08/24/2016 18:24    Assessment/Plan: Diagnosis: right MCA infarct with left neglect, balance deficits due to stroke, left  upper limb. Sensory deficits and poor awareness of deficits 1. Does the need for close, 24 hr/day medical supervision in concert with the patient's rehab needs make it unreasonable for this patient to be served in a less intensive setting? Yes 2. Co-Morbidities requiring supervision/potential complications: aortic stenosis, atrial flutter 3. Due to bladder management, bowel management, safety, skin/wound care, disease management, medication administration, pain management and patient education, does the patient require 24 hr/day rehab nursing? Yes 4. Does the patient require coordinated care of a physician, rehab nurse, PT (1-2 hrs/day, 5 days/week), OT (1-2 hrs/day, 5 days/week) and SLP (.5-1 hrs/day, 6 days/week) to address physical and functional deficits in the context of the above medical diagnosis(es)? Yes Addressing deficits in the following areas: balance, endurance, locomotion, strength, transferring, bowel/bladder control, bathing, dressing, feeding, grooming, toileting, cognition and psychosocial support 5. Can the patient actively participate in an intensive therapy program of at least 3 hrs of therapy per day at least 5 days per week? Yes 6. The potential for patient to make measurable gains while on inpatient rehab is excellent 7. Anticipated functional outcomes upon discharge from inpatient rehab are supervision  with PT, supervision with OT, supervision with SLP. 8. Estimated rehab length of stay to reach the above functional goals is: 14-17d 9. Does the patient have adequate social supports and living environment to accommodate these discharge functional goals? Yes 10. Anticipated D/C setting: Home 11. Anticipated post D/C treatments: [place] therapy 12. Overall Rehab/Functional Prognosis: good  RECOMMENDATIONS: This patient's condition is appropriate for continued rehabilitative care in the following setting: CIR Patient has agreed to participate in recommended program.  Potentially Note that insurance prior authorization may be required for reimbursement for recommended care.  Comment: need to confirm supervision available post discharge, neuro to review CT scan today and decide whether IV heparin can be changed to Eliquis [name] M.D. [facility] FAAPM&R (Sports Med, Neuromuscular Med) Diplomate Am Board of Electrodiagnostic Med   Cathlyn Parsons., PA-C 08/26/2016

## 2016-08-26 NOTE — Progress Notes (Signed)
Inpatient Rehabilitation  Met with patient to discuss team's recommendation for IP Rehab.  Shared booklets and answered questions.  Patient reports that IP Rehab is her preference and she is willing and able to hire help after discharge as needed; discussed anticipated need for 24/7 Supervision.  Plan to follow for timing of medical readiness, patient's decision, and bed availability.  Please call with questions.    Carmelia Roller., CCC/SLP Admission Coordinator  [place]  Cell [phone]

## 2016-08-26 NOTE — Progress Notes (Signed)
Physical Therapy Treatment Patient Details Name: Christine Pugh MRN: [id number] DOB: 12/14/1921 Today's Date: 08/26/2016    History of Present Illness This 83 y.o. female admitted with acute onset of Lt UE paralysis.  CT of head revealed Lt cerebellar subacute infarct and CT of UE showed Lt axillary embolus.  She was transferred to [facility] and underwent Lt brachial radial ulnar axillary embolectomy.  Pt developed neuro decline post up.  Repeat head CT old Lt cerebellar infarct, subacute Rt cerebellar infarct and Rt MCA clot visible - likely due to A-Fib.   She underwent cerebral thrombectomy and complete revascularization.   She was extubated 08/24/16.  PMH includes:  Atypical a-flutter, aortic stenosis, h/o 3 falls in 18 mos,     PT Comments    Pt con't to require assist for all transfers and now requires RW for ambulation however is unsafe with requiring minA. Pt with decreased safety awareness and decreased insight to deficits. Cont' to recommend CIR upon d/c for maximal functional recovery as pt was indep PTA.  Follow Up Recommendations  CIR;Supervision/Assistance - 24 hour     Equipment Recommendations       Recommendations for Other Services Rehab consult     Precautions / Restrictions Precautions Precautions: Fall Restrictions Weight Bearing Restrictions: No    Mobility  Bed Mobility               General bed mobility comments: pt up in chair  Transfers Overall transfer level: Needs assistance Equipment used: Rolling walker (2 wheeled) Transfers: Sit to/from Stand Sit to Stand: Min assist         General transfer comment: pt with good hand placement, minA for power up and to maintain stability during transition of hands  Ambulation/Gait Ambulation/Gait assistance: Min assist;Mod assist Ambulation Distance (Feet): 20 Feet (x2) Assistive device: Rolling walker (2 wheeled) Gait Pattern/deviations: Step-through pattern;Decreased stride length;Shuffle Gait  velocity: decreased Gait velocity interpretation: <1.8 ft/sec, indicative of risk for recurrent falls General Gait Details: max v/c's for safe walker management, minimal step height, SPO2>91% on RA. pt fatigues quickly, walker adjusted for optimal height however pt c/o UEs hurting   Stairs            Wheelchair Mobility    Modified Rankin (Stroke Patients Only) Modified Rankin (Stroke Patients Only) Pre-Morbid Rankin Score: Slight disability Modified Rankin: Moderately severe disability     Balance Overall balance assessment: Needs assistance         Standing balance support: Bilateral upper extremity supported Standing balance-Leahy Scale: Poor Standing balance comment: Pt requires bil. UE support and mod A                     Cognition Arousal/Alertness: Awake/alert Behavior During Therapy: WFL for tasks assessed/performed Overall Cognitive Status: Impaired/Different from baseline Area of Impairment: Attention;Following commands;Problem solving   Current Attention Level: Selective   Following Commands: Follows multi-step commands with increased time     Problem Solving: Difficulty sequencing;Requires verbal cues;Requires tactile cues General Comments: max v/c's for safety awareness, mild impulsivity    Exercises      General Comments General comments (skin integrity, edema, etc.): pt assisted to commode, pt with urinary incontinence. Pt modA to change underwear and socks. pt did perform hygiene on her own      Pertinent Vitals/Pain Pain Assessment: No/denies pain    Home Living  Prior Function            PT Goals (current goals can now be found in the care plan section) Acute Rehab PT Goals Patient Stated Goal: to go home Progress towards PT goals: Progressing toward goals    Frequency    Min 3X/week      PT Plan Current plan remains appropriate    Co-evaluation             End of Session  Equipment Utilized During Treatment: Gait belt Activity Tolerance: Patient tolerated treatment well Patient left: in chair;with call bell/phone within reach     Time: 1041-1108 PT Time Calculation (min) (ACUTE ONLY): 27 min  Charges:  $Gait Training: 8-22 mins $Therapeutic Activity: 8-22 mins                    G Codes:      [place] Mccuen M Hung Rhinesmith 09-05-16, 1:36 PM   Kittie Plater, PT, DPT Pager #: [phone] Office #: [phone]

## 2016-08-26 NOTE — Progress Notes (Addendum)
  Progress Note    08/26/2016 7:48 AM 3 Days Post-Op  Subjective:  C/o her back itching-says this has happened before.  Thinks it started with the BP cuff on the right arm.  Denies any pain in her hand.  Afebrile HR 70's NSR 0000000 systolic [id number]  Vitals:   08/26/16 0411 08/26/16 0430  BP: (!) 124/51   Pulse: 95 72  Resp: 20 (!) 22  Temp: 97.6 F (36.4 C)     Physical Exam: Cardiac:  regular Lungs:  Non labored Incisions:  Clean and dry; extensive ecchymosis left arm Neuro:  Moving all extremities equally  CBC    Component Value Date/Time   WBC 11.2 (H) 08/26/2016 0347   RBC 3.37 (L) 08/26/2016 0347   HGB 10.7 (L) 08/26/2016 0347   HCT 32.9 (L) 08/26/2016 0347   PLT 172 08/26/2016 0347   MCV 97.6 08/26/2016 0347   MCH 31.8 08/26/2016 0347   MCHC 32.5 08/26/2016 0347   RDW 14.3 08/26/2016 0347   LYMPHSABS 1.2 08/26/2016 0347   MONOABS 1.0 08/26/2016 0347   EOSABS 0.5 08/26/2016 0347   BASOSABS 0.0 08/26/2016 0347    BMET    Component Value Date/Time   NA 140 08/26/2016 0347   K 3.0 (L) 08/26/2016 0347   CL 109 08/26/2016 0347   CO2 26 08/26/2016 0347   GLUCOSE 123 (H) 08/26/2016 0347   BUN 9 08/26/2016 0347   CREATININE 0.56 08/26/2016 0347   CALCIUM 8.1 (L) 08/26/2016 0347   GFRNONAA >60 08/26/2016 0347   GFRAA >60 08/26/2016 0347    INR    Component Value Date/Time   INR 1.00 08/23/2016 0354     Intake/Output Summary (Last 24 hours) at 08/26/16 0748 Last data filed at 08/26/16 0500  Gross per 24 hour  Intake              570 ml  Output              600 ml  Net              -30 ml     Assessment:  83 y.o. female is s/p:  Left brachial radial ulnar axillary embolectomy  3 Days Post-Op  Plan: -pt sitting up in chair eating breakfast -neuro in tact -DVT prophylaxis:  Heparin gtt for now--if CT of head from this morning unchanged, and okay with neuro, will convert to Eliquis.   (report pending) -rash on back-will order  Benadryl -continue PT/OT   Leontine Locket, PA-C [facility] [phone] 08/26/2016 7:48 AM  No headaches.  Neuro intact.  2+ left radial pulse stable left antecubital hematoma Hypokalemia replete Eliquis when ok with neurology o/w continue heparin  Ruta Hinds, MD [facility] [place] Office: [phone] Pager: [phone]

## 2016-08-26 NOTE — Progress Notes (Signed)
Radiologist requested that patient's MD look at am head CT.  MD was notified of this.

## 2016-08-26 NOTE — Progress Notes (Signed)
ANTICOAGULATION CONSULT NOTE - Follow Up Consult  Pharmacy Consult for Heparin  Indication: atrial fibrillation, DVT and stroke  No Known Allergies  Patient Measurements: Height: 5\' 3"  (160 cm) Weight: 143 lb 8.3 oz (65.1 kg) IBW/kg (Calculated) : 52.4  Vital Signs: Temp: 97.6 F (36.4 C) (01/24 0411) Temp Source: Oral (01/24 0411) BP: 124/51 (01/24 0411) Pulse Rate: 95 (01/24 0411)  Labs:  Recent Labs  08/24/16 0600  08/25/16 0331 08/25/16 1411 08/25/16 2149 08/26/16 0347  HGB 11.2*  --  10.1*  --   --  10.7*  HCT 34.2*  --  30.7*  --   --  32.9*  PLT 181  --  158  --   --  172  HEPARINUNFRC  --   < > 0.15* 0.36 0.33 0.17*  CREATININE 0.71  --  0.69  --   --   --   < > = values in this interval not displayed.  Estimated Creatinine Clearance (by C-G formula based on SCr of 0.69 mg/dL) Female: 39.9 mL/min Female: 46.4 mL/min  Assessment: 83yo female s/p CVA, s/p embolectomy of LUE DVT, and with hx afib continues on heparin with plans to transition to eliquis in a few days if cerebellar infarct is stable. Heparin level down to subtherapeutic (0.17) on gtt at 850 units/hr. No issues with line or bleeding reported per RN.   Goal of Therapy:  Heparin level 0.3-0.5 units/mL Monitor platelets by anticoagulation protocol: Yes   Plan:  1) Increase heparin to 1000 units/hr 2) Will f/u 8 hr heparin level  Sherlon Handing, PharmD, BCPS Clinical pharmacist, pager [phone] 08/26/2016 4:27 AM

## 2016-08-26 NOTE — Progress Notes (Signed)
ANTICOAGULATION CONSULT NOTE - Follow Up Consult  Pharmacy Consult for Heparin  Indication: atrial fibrillation, DVT and stroke  No Known Allergies  Patient Measurements: Height: 5\' 3"  (160 cm) Weight: 143 lb 8.3 oz (65.1 kg) IBW/kg (Calculated) : 52.4  Vital Signs: Temp: 97.8 F (36.6 C) (01/24 1345) Temp Source: Oral (01/24 1345) BP: 115/67 (01/24 1345) Pulse Rate: 65 (01/24 1345)  Labs:  Recent Labs  08/24/16 0600 08/25/16 0331  08/26/16 0347 08/26/16 1303 08/26/16 1832  HGB 11.2* 10.1*  --  10.7*  --   --   HCT 34.2* 30.7*  --  32.9*  --   --   PLT 181 158  --  172  --   --   HEPARINUNFRC  --  0.15*  < > 0.17* 0.40 0.56  CREATININE 0.71 0.69  --  0.56  --   --   < > = values in this interval not displayed.  Estimated Creatinine Clearance (by C-G formula based on SCr of 0.56 mg/dL) Female: 39.9 mL/min Female: 46.4 mL/min  Medications: Heparin @ 1000 units/hr  Assessment: 83yo female s/p CVA, s/p embolectomy of LUE DVT, and with hx afib continuing on heparin with plans to transition to eliquis in a few days if cerebellar infarct is stable.   Heparin level is now slightly above goal (heparin level=0.56). CBC stable, no bleed or IV line issues per RN.   Goal of Therapy:  Heparin level 0.3-0.5 units/mL Monitor platelets by anticoagulation protocol: Yes   Plan:  Decrease heparin to 950 units/h 8h heparin level Daily heparin level/CBC Monitor for s/sx bleeding   Elicia Lamp, PharmD, BCPS Clinical Pharmacist 08/26/2016 7:26 PM

## 2016-08-26 NOTE — Care Management Important Message (Signed)
Important Message  Patient Details  Name: Christine Pugh MRN: [id number] Date of Birth: September 20, 1921   Medicare Important Message Given:  Yes    Nathen May 08/26/2016, 12:39 PM

## 2016-08-26 NOTE — Progress Notes (Signed)
ANTICOAGULATION CONSULT NOTE - Follow Up Consult  Pharmacy Consult for Heparin  Indication: atrial fibrillation, DVT and stroke  No Known Allergies  Patient Measurements: Height: 5\' 3"  (160 cm) Weight: 143 lb 8.3 oz (65.1 kg) IBW/kg (Calculated) : 52.4  Vital Signs: Temp: 97.8 F (36.6 C) (01/24 1345) Temp Source: Oral (01/24 1345) BP: 115/67 (01/24 1345) Pulse Rate: 65 (01/24 1345)  Labs:  Recent Labs  08/24/16 0600 08/25/16 0331  08/25/16 2149 08/26/16 0347 08/26/16 1303  HGB 11.2* 10.1*  --   --  10.7*  --   HCT 34.2* 30.7*  --   --  32.9*  --   PLT 181 158  --   --  172  --   HEPARINUNFRC  --  0.15*  < > 0.33 0.17* 0.40  CREATININE 0.71 0.69  --   --  0.56  --   < > = values in this interval not displayed.  Estimated Creatinine Clearance (by C-G formula based on SCr of 0.56 mg/dL) Female: 39.9 mL/min Female: 46.4 mL/min  Medications: Heparin @ 1000 units/hr  Assessment: 83yo female s/p CVA, s/p embolectomy of LUE DVT, and with hx afib continues on heparin with plans to transition to eliquis in a few days if cerebellar infarct is stable.  -heparin level is at goal (heparin level=0.4; up from 0.17)    Goal of Therapy:  Heparin level 0.3-0.5 units/mL Monitor platelets by anticoagulation protocol: Yes   Plan:  -No heparin changes needed -Will confirm a heparin level later today  Hildred Laser, Pharm D 08/26/2016 3:07 PM

## 2016-08-27 LAB — CBC WITH DIFFERENTIAL/PLATELET
Basophils Absolute: 0 10*3/uL (ref 0.0–0.1)
Basophils Relative: 0 %
EOS ABS: 0.5 10*3/uL (ref 0.0–0.7)
EOS PCT: 4 %
HCT: 30.9 % — ABNORMAL LOW (ref 36.0–46.0)
Hemoglobin: 9.9 g/dL — ABNORMAL LOW (ref 12.0–15.0)
LYMPHS ABS: 1.1 10*3/uL (ref 0.7–4.0)
LYMPHS PCT: 9 %
MCH: 31.2 pg (ref 26.0–34.0)
MCHC: 32 g/dL (ref 30.0–36.0)
MCV: 97.5 fL (ref 78.0–100.0)
MONO ABS: 1 10*3/uL (ref 0.1–1.0)
MONOS PCT: 9 %
Neutro Abs: 9.4 10*3/uL — ABNORMAL HIGH (ref 1.7–7.7)
Neutrophils Relative %: 78 %
PLATELETS: 182 10*3/uL (ref 150–400)
RBC: 3.17 MIL/uL — ABNORMAL LOW (ref 3.87–5.11)
RDW: 14.2 % (ref 11.5–15.5)
WBC: 12.1 10*3/uL — AB (ref 4.0–10.5)

## 2016-08-27 LAB — RENAL FUNCTION PANEL
Albumin: 2.3 g/dL — ABNORMAL LOW (ref 3.5–5.0)
Anion gap: 6 (ref 5–15)
BUN: 11 mg/dL (ref 6–20)
CHLORIDE: 106 mmol/L (ref 101–111)
CO2: 26 mmol/L (ref 22–32)
CREATININE: 0.59 mg/dL (ref 0.44–1.00)
Calcium: 8.1 mg/dL — ABNORMAL LOW (ref 8.9–10.3)
GFR calc Af Amer: >60 mL/min (ref >60)
GLUCOSE: 118 mg/dL — AB (ref 65–99)
Phosphorus: 1.7 mg/dL — ABNORMAL LOW (ref 2.5–4.6)
Potassium: 4.2 mmol/L (ref 3.5–5.1)
Sodium: 138 mmol/L (ref 135–145)

## 2016-08-27 LAB — CUP PACEART REMOTE DEVICE CHECK
Date Time Interrogation Session: 20171206130758
MDC IDC PG IMPLANT DT: 20170609

## 2016-08-27 LAB — HEPARIN LEVEL (UNFRACTIONATED): Heparin Unfractionated: 0.32 IU/mL (ref 0.30–0.70)

## 2016-08-27 LAB — MAGNESIUM: MAGNESIUM: 1.9 mg/dL (ref 1.7–2.4)

## 2016-08-27 MED ORDER — IPRATROPIUM-ALBUTEROL 0.5-2.5 (3) MG/3ML IN SOLN
3.0000 mL | Freq: Two times a day (BID) | RESPIRATORY_TRACT | Status: DC
  Administered 2016-08-27 – 2016-08-28 (×3): 3 mL via RESPIRATORY_TRACT
  Filled 2016-08-27 (×3): qty 3
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
## 2016-08-27 NOTE — Progress Notes (Signed)
Insurance check completed for Eliquis S/W JAMEIL @ [employer] RX # [phone]   ELIQUIS 5 MG BID   COVER- YES  CO-PAY- 100 % OF CO-INS  DEDUCTIBLE NOT MET  TIER- 3 DRUG  PRIOR APPROVAL-NO  PHARMACY : [facility]

## 2016-08-27 NOTE — Progress Notes (Addendum)
Inpatient Rehabilitation  No bed available for this patient today.  Plan to follow along for timing of medical readiness and bed availability tomorrow.  I have updated the team.  Please call with questions.   Addendum: In my absence tomorrow, please plan for my co-worker Danne Baxter to follow up.  She can be reached at [phone].  Carmelia Roller., CCC/SLP Admission Coordinator  [place]  Cell [phone]

## 2016-08-27 NOTE — Progress Notes (Signed)
Occupational Therapy Treatment Patient Details Name: Christine Pugh MRN: [id number] DOB: 1922/07/29 Today's Date: 08/27/2016    History of present illness This 83 y.o. female admitted with acute onset of Lt UE paralysis.  CT of head revealed Lt cerebellar subacute infarct and CT of UE showed Lt axillary embolus.  She was transferred to [facility] and underwent Lt brachial radial ulnar axillary embolectomy.  Pt developed neuro decline post up.  Repeat head CT old Lt cerebellar infarct, subacute Rt cerebellar infarct and Rt MCA clot visible - likely due to A-Fib.   She underwent cerebral thrombectomy and complete revascularization.   She was extubated 08/24/16.  PMH includes:  Atypical a-flutter, aortic stenosis, h/o 3 falls in 18 mos,    OT comments  Pt with decreased activity tolerance with dyspnea with minimal exertion on 2 L 02. Min assist to transfer to [facility] and perform management of brief and pericare. Performed seated grooming with set up and verbal cues for thoroughness. Continues to demonstrate decreased attention and was focused more on the set up of her room vs ADL. Will continue to follow.  Follow Up Recommendations  CIR;Supervision/Assistance - 24 hour    Equipment Recommendations       Recommendations for Other Services      Precautions / Restrictions Precautions Precautions: Fall       Mobility Bed Mobility               General bed mobility comments: pt up in chair  Transfers Overall transfer level: Needs assistance Equipment used: None Transfers: Sit to/from [facility] Sit to Stand: Min assist Stand pivot transfers: Min assist       General transfer comment: chair to [facility] back to chair    Balance Overall balance assessment: Needs assistance   Sitting balance-Leahy Scale: Fair       Standing balance-Leahy Scale: Poor Standing balance comment: min assist for balance while using UEs                   ADL Overall ADL's : Needs  assistance/impaired     Grooming: Wash/dry hands;Wash/dry face;Oral care;Sitting;Set up Grooming Details (indicate cue type and reason): cleaned dentures                 Toilet Transfer: Minimal assistance;Stand-pivot;BSC   Toileting- Clothing Manipulation and Hygiene: Minimal assistance;Sit to/from stand Toileting - Clothing Manipulation Details (indicate cue type and reason): managed her pants with min assist to steady       General ADL Comments: Pt demonstrating ability to see Ipad screen and locate all visual targets.      Vision                     Perception     Praxis      Cognition   Behavior During Therapy: [facility] for tasks assessed/performed Overall Cognitive Status: Impaired/Different from baseline Area of Impairment: Attention;Problem solving;Memory;Safety/judgement   Current Attention Level: Selective    Following Commands: Follows multi-step commands with increased time Safety/Judgement: Decreased awareness of safety   Problem Solving: Difficulty sequencing;Requires verbal cues;Requires tactile cues General Comments: Pt with difficulty attending to task due to concern with her electronics being where she could reach them and plugged in.    Extremity/Trunk Assessment               Exercises     Shoulder Instructions       General Comments      Pertinent Vitals/  Pain       Pain Assessment: No/denies pain  Home Living                                          Prior Functioning/Environment              Frequency  Min 2X/week        Progress Toward Goals  OT Goals(current goals can now be found in the care plan section)  Progress towards OT goals: Progressing toward goals  Acute Rehab OT Goals Patient Stated Goal: to go home Time For Goal Achievement: 09/08/16 Potential to Achieve Goals: Good  Plan Discharge plan remains appropriate    Co-evaluation                 End of Session  Equipment Utilized During Treatment: Oxygen (2L)   Activity Tolerance Patient limited by fatigue   Patient Left in chair;with call bell/phone within reach   Nurse Communication          Time: [id number] OT Time Calculation (min): 20 min  Charges: OT General Charges $OT Visit: 1 Procedure OT Treatments $Self Care/Home Management : 8-22 mins  Malka So 08/27/2016, 2:33 PM  [phone]

## 2016-08-27 NOTE — Progress Notes (Addendum)
STROKE TEAM PROGRESS NOTE   HISTORY OF PRESENT ILLNESS (per record) Christine Pugh is a 83 y.o. female. transferred from outside hospital due to subacute right cerebellar infarct on CT early this am and decreased vascular perfusion of the left arm due to brachial artery emboli.  She has paroxysmal a. Fib.  She underwent uneventful embolectomy today and was last seen normal at 1 pm.  At 2 pm, RN saw patient with mental status changes, right gaze deviation, and left hemiparesis.  CT Brain  Shows no hemorrhage, but old left cerebellar infarct and subacute right cerebellar infarct and right MCA clot visible.  I see hypodensity in the right frontal and parietal areas, but radiologist does not confirm that.  CTA Brain and Neck with perfusion studies were ordered in anticipation of mechanical thrombectomy.  She is not an ideal candidate for IV tPA given recent surgery.     SUBJECTIVE (INTERVAL HISTORY) No family members present.She is sitting in bedside chair The patient is now willing to take oral anticoagulants and I explained risk benefit and she voiced understanding .   OBJECTIVE Temp:  [97.8 F (36.6 C)-98.4 F (36.9 C)] 98.4 F (36.9 C) (01/25 0900) Pulse Rate:  [65-73] 73 (01/25 0532) Cardiac Rhythm: Heart block (01/25 0700) Resp:  [19] 19 (01/24 1345) BP: (115-146)/(55-102) 146/102 (01/25 0900) SpO2:  [94 %-98 %] 97 % (01/25 0900)  CBC:   Recent Labs Lab 08/26/16 0347 08/27/16 0324  WBC 11.2* 12.1*  NEUTROABS 8.5* 9.4*  HGB 10.7* 9.9*  HCT 32.9* 30.9*  MCV 97.6 97.5  PLT 172 [id number]    Basic Metabolic Panel:   Recent Labs Lab 08/26/16 0347 08/27/16 0324  NA 140 138  K 3.0* 4.2  CL 109 106  CO2 26 26  GLUCOSE 123* 118*  BUN 9 11  CREATININE 0.56 0.59  CALCIUM 8.1* 8.1*  MG 1.9 1.9  PHOS 1.6* 1.7*    Lipid Panel:     Component Value Date/Time   CHOL 164 08/25/2016 0331   TRIG 69 08/25/2016 0331   HDL 56 08/25/2016 0331   CHOLHDL 2.9 08/25/2016 0331   VLDL 14  08/25/2016 0331   LDLCALC 94 08/25/2016 0331   HgbA1c:  Lab Results  Component Value Date   HGBA1C 5.7 (H) 08/25/2016   Urine Drug Screen:     Component Value Date/Time   LABOPIA NONE DETECTED 08/24/2016 1648   COCAINSCRNUR NONE DETECTED 08/24/2016 1648   LABBENZ POSITIVE (A) 08/24/2016 1648   AMPHETMU NONE DETECTED 08/24/2016 1648   THCU NONE DETECTED 08/24/2016 1648   LABBARB NONE DETECTED 08/24/2016 1648      IMAGING  Ct Angio Head and Neck W Or Wo Contrast 08/23/2016 Atherosclerotic disease at both carotid bifurcations. No stenosis on the right. 40% stenosis of the proximal ICA on the left. Embolic occlusion of the right MCA at the M1 segment.  Ectatic ascending aorta, transverse diameter up to 4.1 cm.  Ascending aorta not completely imaged. 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  50% stenosis at both vertebral artery origins.    Ct Head Wo Contrast 08/23/2016 1. Contrast staining and mild gyral swelling involving the right insula and temporal lobe in the MCA territory.  Superimposed petechial hemorrhage also possible.  2. Subacute right cerebellar infarct.     Ct Head Wo Contrast 08/23/2016 Moderate size subacute infarct in the right inferior cerebellum.  Generalized atrophy and chronic small vessel ischemia.      Ct Angio Up  Extrem Left W &/or Wo Contast 08/23/2016 Occlusion of the distal left axillary artery spanning 5 cm with distal reconstitution of the brachial artery in the mid proximal humerus.  Brachial arterial flow to the elbow is thready with small caliber vessels.      Ct Cerebral Perfusion W Contrast [id number] Embolic occlusion of the right M1 segment. Central blood flow less than 30% volume of 67 cc. T-max greater than 6 seconds 294 cc. Mismatch volume 227 cc. Confounding factors as discussed in the last paragraph above.     Ir Percutaneous Art Thrombectomy/infusion Intracranial Inc Diag Angio 08/23/2016 Status post cerebral angiogram and  complete revascularization (TICI 3) of right MCA occlusion with 3 passes of SOLUMBRA technique, using stentriever technology and local aspiration. Deployment of Exoseal for hemostasis.     Ct Head Code Stroke Wo Contrast 08/23/2016 Re- demonstration of acute infarction at the inferior cerebellum on the right.  Question involvement of the right cerebral peduncle and thalamus.  Newly seen hyperdense right MCA consistent with embolus to that vessel.  No sign of brain infarction in the territory at this time however.  Aspects 10.   CT head wo 08/24/2016 :Right posterior inferior cerebellar artery distribution infarct involving majority of inferior aspect the right cerebellum with swelling and narrowing of the outlet of the fourth ventricle.Interval clearing of contrast enhancement involving right subinsular and operculum region. Now noted is an acute nonhemorrhagic infarct of the right subinsular/ peri operculum region.  PHYSICAL EXAM Frail elderly Caucasian lady who is intubated and not sedated. She has bilateral groin arterial sheaths. . Afebrile. Head is nontraumatic. Neck is supple without bruit.    Cardiac exam no murmur or gallop. Lungs are clear to auscultation. Distal pulses are well felt.  Neurological Exam : Intubated but not sedated. Awake alert and following commands quite well. Extraocular movements are full range without nystagmus. Decreased blink to threat on the left compared to the right. Pupils equal reactive. Fundi not visualized. No facial weakness. Tongue midline. Able to move all 4 extremities against gravity.   Plantars bilateral downgoing. Sensation appears preserved bilaterally. Gait was not tested.    ASSESSMENT/PLAN [name] is a 83 y.o. female with history of atrial fibrillation (not anticoagulated), hypertension, and aortic valvular disease presenting with right gaze deviation and left hemiparesis. She did not receive IV t-PA due to a recent  embolectomy.  Stroke:  Non-dominant  Infarct - embolic secondary to atrial fibrillation. S/p mechanical embolectomy and complete revascularization  Resultant  Mild left homonymous hemianopia  MRI - not performed  MRA - not performed  CTA H&N - Embolic occlusion of the right MCA at the M1 segment.   CT Head - acute infarction at the inferior cerebellum on the right- likely from embolectomy procedures  Carotid Doppler - CTA neck 2D Echo - Left ventricle: The cavity size was normal. There was mild   concentric hypertrophy. Systolic function was vigorous. The   estimated ejection fraction was in the range of 65% to 70%. Wall   motion was normal; there were no regional wall motion    abnormalities.  LDL -94 mg   HgbA1c -5.7  VTE prophylaxis - SCDs Diet Heart Room service appropriate? Yes; Fluid consistency: Thin  aspirin 81 mg daily prior to admission, now on aspirin 81 mg daily  Patient will be counseled to be compliant with her antithrombotic medications  Ongoing aggressive stroke risk factor management  Therapy recommendations: pending  Disposition:  Pending  Hypertension  Blood  pressure tends to run low  Permissive hypertension (OK if < 220/120) but gradually normalize in 5-7 days  Long-term BP goal normotensive  Hyperlipidemia  Home meds: No blood pressure lowering medications prior to admission.  LDL 94 mg %, goal < 70 start lipitor 20 mg   Other Stroke Risk Factors  Advanced age  Atrial fibrillation not anticoagulated    Other Active Problems  Status post cerebral angiogram and complete revascularization (TICI 3) of right MCA occlusion  S/P embolectomy left upper extremity - 08/23/2016 - Dr. Oneida Alar  Mild anemia  Mild leukocytosis  Ectatic ascending aorta, transverse diameter up to 4.1 cm.  Atrial fibrillation not anticoagulated prior to admission  PLAN  Continue IV Heparin anticoagulation  And will start eliquis tomorrow  if cerebellar  infarct is stable.  Await inpatient rehab transfer  Hospital day # 4    I have personally examined this patient, reviewed notes, independently viewed imaging studies, participated in medical decision making and plan of care.ROS completed by me personally and pertinent positives fully documented  I have made any additions or clarifications directly to the above note. Recommend mobilize out of bed. Therapy and rehab consults. Change iv heparin to eliquis 08/28/2016.Greater than 50 % time during this 25 minute visit was spent on counselling and coordination of care about stroke, afib and answering questions.     Antony Contras, MD Medical Director [place] Pager: [phone] 08/27/2016 12:37 PM   08/27/2016, 12:37 PM   To contact Stroke Continuity provider, please refer to [email or web address] After hours, contact General Neurology

## 2016-08-27 NOTE — Progress Notes (Addendum)
  [facility] Progress Note  Subjective  - POD #4  Left arm itches. Asking about when she can be put on new blood thinner.   Objective Vitals:   08/27/16 0016 08/27/16 0532  BP: (!) 116/55 125/61  Pulse: 72 73  Resp:    Temp: 98.3 F (36.8 C) 98.1 F (36.7 C)    Intake/Output Summary (Last 24 hours) at 08/27/16 0841 Last data filed at 08/27/16 0534  Gross per 24 hour  Intake           384.03 ml  Output              200 ml  Net           184.03 ml   Left arm with diffuse ecchymosis. Antecubital incision intact. 2+ left radial pulse. Moving all extremities equally.   Assessment/Planning: 83 y.o. female is s/p: left brachial, radial, ulnar and axillary embolectomy 4 Days Post-Op   Good perfusion left upper extremity.  Neuro exam intact.  Continue heparin. Per neurology, will start Eliquis in few days once cerebellar infarcts stable. Hypokalemia resolved.  Continue mobilization, PT and OT.  Possible CIR at d/c.   Alvia Grove 08/27/2016 8:41 AM --  2+ left radial pulse hematoma left arm stable To Rehab and start Eliquis when ok with Neurology Otherwise ready for d/c Laboratory CBC    Component Value Date/Time   WBC 12.1 (H) 08/27/2016 0324   HGB 9.9 (L) 08/27/2016 0324   HCT 30.9 (L) 08/27/2016 0324   PLT 182 08/27/2016 0324    BMET    Component Value Date/Time   NA 138 08/27/2016 0324   K 4.2 08/27/2016 0324   CL 106 08/27/2016 0324   CO2 26 08/27/2016 0324   GLUCOSE 118 (H) 08/27/2016 0324   BUN 11 08/27/2016 0324   CREATININE 0.59 08/27/2016 0324   CALCIUM 8.1 (L) 08/27/2016 0324   GFRNONAA >60 08/27/2016 0324   GFRAA >60 08/27/2016 0324    COAG Lab Results  Component Value Date   INR 1.00 08/23/2016   No results found for: PTT  Antibiotics Anti-infectives    Start     Dose/Rate Route Frequency Ordered Stop   08/23/16 2200  cefUROXime (ZINACEF) 1.5 g in dextrose 5 % 50 mL IVPB     1.5 g 100 mL/hr over 30 Minutes  Intravenous Every 12 hours 08/23/16 2148 08/24/16 1024   08/23/16 1553  ceFAZolin (ANCEF) 2-4 GM/100ML-% IVPB    Comments:  Marinda Elk   : cabinet override      08/23/16 1553 08/24/16 0359   08/23/16 0800  cefUROXime (ZINACEF) 1.5 g in dextrose 5 % 50 mL IVPB     1.5 g 100 mL/hr over 30 Minutes Intravenous To Short Stay 08/23/16 0749 08/23/16 [place], PA-C [facility] Office: [phone] Pager: [phone] 08/27/2016 8:41 AM

## 2016-08-27 NOTE — Progress Notes (Signed)
Carelink summary report received. Battery status OK. Normal device function. No new symptom episodes, tachy episodes, brady, or pause episodes. No new AF episodes. Monthly summary reports and ROV/PRN 

## 2016-08-27 NOTE — Progress Notes (Signed)
ANTICOAGULATION CONSULT NOTE - Follow Up Consult  Pharmacy Consult for Heparin  Indication: atrial fibrillation, DVT and stroke  No Known Allergies  Patient Measurements: Height: 5\' 3"  (160 cm) Weight: 143 lb 8.3 oz (65.1 kg) IBW/kg (Calculated) : 52.4  Vital Signs: Temp: 98.1 F (36.7 C) (01/25 0532) Temp Source: Oral (01/25 0532) BP: 125/61 (01/25 0532) Pulse Rate: 73 (01/25 0532)  Labs:  Recent Labs  08/25/16 0331  08/26/16 0347 08/26/16 1303 08/26/16 1832 08/27/16 0324  HGB 10.1*  --  10.7*  --   --  9.9*  HCT 30.7*  --  32.9*  --   --  30.9*  PLT 158  --  172  --   --  182  HEPARINUNFRC 0.15*  < > 0.17* 0.40 0.56 0.32  CREATININE 0.69  --  0.56  --   --  0.59  < > = values in this interval not displayed.  Estimated Creatinine Clearance (by C-G formula based on SCr of 0.59 mg/dL) Female: 39.9 mL/min Female: 46.4 mL/min   Assessment: 83yo female s/p CVA, s/p embolectomy of LUE DVT, and with hx afib continues on heparin with plans to transition to eliquis in a few days if cerebellar infarct is stable.  -heparin level is at goal (heparin level=0.32)  Goal of Therapy:  Heparin level 0.3-0.5 units/mL Monitor platelets by anticoagulation protocol: Yes   Plan:  -No heparin changes needed -Daily CBC and heparin level  Hildred Laser, Pharm D 08/27/2016 8:07 AM

## 2016-08-28 ENCOUNTER — Inpatient Hospital Stay (HOSPITAL_COMMUNITY): Payer: Medicare Other

## 2016-08-28 ENCOUNTER — Telehealth: Payer: Self-pay | Admitting: Vascular Surgery

## 2016-08-28 ENCOUNTER — Inpatient Hospital Stay (HOSPITAL_COMMUNITY)
Admission: RE | Admit: 2016-08-28 | Discharge: 2016-09-05 | DRG: 057 | Disposition: A | Discharge status: Home Health | Payer: Medicare Other | Source: Intra-hospital | Attending: Physical Medicine & Rehabilitation | Admitting: Physical Medicine & Rehabilitation

## 2016-08-28 DIAGNOSIS — K649 Unspecified hemorrhoids: Secondary | ICD-10-CM

## 2016-08-28 DIAGNOSIS — I10 Essential (primary) hypertension: Secondary | ICD-10-CM

## 2016-08-28 DIAGNOSIS — R0602 Shortness of breath: Secondary | ICD-10-CM

## 2016-08-28 DIAGNOSIS — Z66 Do not resuscitate: Secondary | ICD-10-CM | POA: Diagnosis not present

## 2016-08-28 DIAGNOSIS — R0902 Hypoxemia: Secondary | ICD-10-CM

## 2016-08-28 DIAGNOSIS — H53462 Homonymous bilateral field defects, left side: Secondary | ICD-10-CM

## 2016-08-28 DIAGNOSIS — E860 Dehydration: Secondary | ICD-10-CM

## 2016-08-28 DIAGNOSIS — I69393 Ataxia following cerebral infarction: Secondary | ICD-10-CM | POA: Diagnosis not present

## 2016-08-28 DIAGNOSIS — I69354 Hemiplegia and hemiparesis following cerebral infarction affecting left non-dominant side: Principal | ICD-10-CM

## 2016-08-28 DIAGNOSIS — I69392 Facial weakness following cerebral infarction: Secondary | ICD-10-CM | POA: Diagnosis not present

## 2016-08-28 DIAGNOSIS — I352 Nonrheumatic aortic (valve) stenosis with insufficiency: Secondary | ICD-10-CM | POA: Diagnosis not present

## 2016-08-28 DIAGNOSIS — I6601 Occlusion and stenosis of right middle cerebral artery: Secondary | ICD-10-CM

## 2016-08-28 DIAGNOSIS — G8194 Hemiplegia, unspecified affecting left nondominant side: Secondary | ICD-10-CM | POA: Diagnosis not present

## 2016-08-28 DIAGNOSIS — I359 Nonrheumatic aortic valve disorder, unspecified: Secondary | ICD-10-CM | POA: Diagnosis not present

## 2016-08-28 DIAGNOSIS — Z79899 Other long term (current) drug therapy: Secondary | ICD-10-CM | POA: Diagnosis not present

## 2016-08-28 DIAGNOSIS — Z8673 Personal history of transient ischemic attack (TIA), and cerebral infarction without residual deficits: Secondary | ICD-10-CM | POA: Diagnosis not present

## 2016-08-28 DIAGNOSIS — Z7982 Long term (current) use of aspirin: Secondary | ICD-10-CM | POA: Diagnosis not present

## 2016-08-28 DIAGNOSIS — I749 Embolism and thrombosis of unspecified artery: Secondary | ICD-10-CM

## 2016-08-28 DIAGNOSIS — I509 Heart failure, unspecified: Secondary | ICD-10-CM

## 2016-08-28 DIAGNOSIS — I69328 Other speech and language deficits following cerebral infarction: Secondary | ICD-10-CM

## 2016-08-28 DIAGNOSIS — I998 Other disorder of circulatory system: Secondary | ICD-10-CM | POA: Diagnosis present

## 2016-08-28 DIAGNOSIS — I69398 Other sequelae of cerebral infarction: Secondary | ICD-10-CM | POA: Diagnosis not present

## 2016-08-28 DIAGNOSIS — E039 Hypothyroidism, unspecified: Secondary | ICD-10-CM | POA: Diagnosis not present

## 2016-08-28 DIAGNOSIS — I63411 Cerebral infarction due to embolism of right middle cerebral artery: Secondary | ICD-10-CM | POA: Diagnosis not present

## 2016-08-28 DIAGNOSIS — I4891 Unspecified atrial fibrillation: Secondary | ICD-10-CM | POA: Diagnosis not present

## 2016-08-28 DIAGNOSIS — I63419 Cerebral infarction due to embolism of unspecified middle cerebral artery: Secondary | ICD-10-CM | POA: Diagnosis present

## 2016-08-28 DIAGNOSIS — I484 Atypical atrial flutter: Secondary | ICD-10-CM

## 2016-08-28 DIAGNOSIS — E8779 Other fluid overload: Secondary | ICD-10-CM | POA: Diagnosis not present

## 2016-08-28 LAB — RENAL FUNCTION PANEL
ANION GAP: 5 (ref 5–15)
Albumin: 2.1 g/dL — ABNORMAL LOW (ref 3.5–5.0)
BUN: 10 mg/dL (ref 6–20)
CHLORIDE: 103 mmol/L (ref 101–111)
CO2: 27 mmol/L (ref 22–32)
Calcium: 8 mg/dL — ABNORMAL LOW (ref 8.9–10.3)
Creatinine, Ser: 0.61 mg/dL (ref 0.44–1.00)
GFR calc Af Amer: >60 mL/min (ref >60)
GFR calc non Af Amer: >60 mL/min (ref >60)
GLUCOSE: 110 mg/dL — AB (ref 65–99)
POTASSIUM: 3.7 mmol/L (ref 3.5–5.1)
Phosphorus: 1.9 mg/dL — ABNORMAL LOW (ref 2.5–4.6)
Sodium: 135 mmol/L (ref 135–145)

## 2016-08-28 LAB — CBC WITH DIFFERENTIAL/PLATELET
BASOS ABS: 0 10*3/uL (ref 0.0–0.1)
BASOS PCT: 0 %
Eosinophils Absolute: 0.5 10*3/uL (ref 0.0–0.7)
Eosinophils Relative: 5 %
HEMATOCRIT: 29.3 % — AB (ref 36.0–46.0)
HEMOGLOBIN: 9.5 g/dL — AB (ref 12.0–15.0)
LYMPHS PCT: 13 %
Lymphs Abs: 1.3 10*3/uL (ref 0.7–4.0)
MCH: 31.4 pg (ref 26.0–34.0)
MCHC: 32.4 g/dL (ref 30.0–36.0)
MCV: 96.7 fL (ref 78.0–100.0)
MONOS PCT: 11 %
Monocytes Absolute: 1.2 10*3/uL — ABNORMAL HIGH (ref 0.1–1.0)
NEUTROS ABS: 7.3 10*3/uL (ref 1.7–7.7)
NEUTROS PCT: 71 %
Platelets: 195 10*3/uL (ref 150–400)
RBC: 3.03 MIL/uL — ABNORMAL LOW (ref 3.87–5.11)
RDW: 14.3 % (ref 11.5–15.5)
WBC: 10.2 10*3/uL (ref 4.0–10.5)

## 2016-08-28 LAB — BASIC METABOLIC PANEL
ANION GAP: 5 (ref 5–15)
BUN: 10 mg/dL (ref 6–20)
CALCIUM: 7.9 mg/dL — AB (ref 8.9–10.3)
CHLORIDE: 103 mmol/L (ref 101–111)
CO2: 27 mmol/L (ref 22–32)
Creatinine, Ser: 0.65 mg/dL (ref 0.44–1.00)
GFR calc non Af Amer: >60 mL/min (ref >60)
GLUCOSE: 110 mg/dL — AB (ref 65–99)
Potassium: 3.7 mmol/L (ref 3.5–5.1)
Sodium: 135 mmol/L (ref 135–145)

## 2016-08-28 LAB — HEPARIN LEVEL (UNFRACTIONATED)
Heparin Unfractionated: 0.2 IU/mL — ABNORMAL LOW (ref 0.30–0.70)
Heparin Unfractionated: 0.42 IU/mL (ref 0.30–0.70)

## 2016-08-28 LAB — MAGNESIUM: MAGNESIUM: 1.7 mg/dL (ref 1.7–2.4)

## 2016-08-28 MED ORDER — BISACODYL 10 MG RE SUPP: 10.0000 mg | Freq: Every day | RECTAL | Status: DC | PRN

## 2016-08-28 MED ORDER — CYPROHEPTADINE HCL 4 MG PO TABS: 4.0000 mg | ORAL_TABLET | Freq: Three times a day (TID) | ORAL | Status: DC | PRN

## 2016-08-28 MED ORDER — PROCHLORPERAZINE EDISYLATE 5 MG/ML IJ SOLN: 5.0000 mg | Freq: Four times a day (QID) | INTRAMUSCULAR | Status: DC | PRN

## 2016-08-28 MED ORDER — APIXABAN 5 MG PO TABS
5.0000 mg | ORAL_TABLET | Freq: Two times a day (BID) | ORAL | Status: DC
  Administered 2016-08-28 – 2016-09-05 (×16): 5 mg via ORAL
  Filled 2016-08-28 (×16): qty 1

## 2016-08-28 MED ORDER — TRIAMTERENE-HCTZ 37.5-25 MG PO CAPS
1.0000 | ORAL_CAPSULE | Freq: Every day | ORAL | Status: DC
  Administered 2016-08-29 – 2016-09-05 (×8): 1 via ORAL
  Filled 2016-08-28 (×8): qty 1

## 2016-08-28 MED ORDER — ACETAMINOPHEN 325 MG PO TABS
325.0000 mg | ORAL_TABLET | ORAL | Status: DC | PRN
  Filled 2016-08-28: qty 2

## 2016-08-28 MED ORDER — ATORVASTATIN CALCIUM 20 MG PO TABS
20.0000 mg | ORAL_TABLET | Freq: Every day | ORAL | Status: DC
  Administered 2016-08-28 – 2016-09-04 (×8): 20 mg via ORAL
  Filled 2016-08-28 (×8): qty 1

## 2016-08-28 MED ORDER — PROCHLORPERAZINE 25 MG RE SUPP: 12.5000 mg | Freq: Four times a day (QID) | RECTAL | Status: DC | PRN

## 2016-08-28 MED ORDER — ATORVASTATIN CALCIUM 20 MG PO TABS: 20.0000 mg | ORAL_TABLET | Freq: Every day | ORAL | Status: DC

## 2016-08-28 MED ORDER — IPRATROPIUM-ALBUTEROL 0.5-2.5 (3) MG/3ML IN SOLN
3.0000 mL | Freq: Two times a day (BID) | RESPIRATORY_TRACT | Status: DC
  Administered 2016-08-28 – 2016-08-29 (×2): 3 mL via RESPIRATORY_TRACT
  Filled 2016-08-28 (×3): qty 3

## 2016-08-28 MED ORDER — AMIODARONE HCL 100 MG PO TABS: 50.0000 mg | ORAL_TABLET | Freq: Every day | ORAL | Status: DC

## 2016-08-28 MED ORDER — GUAIFENESIN-DM 100-10 MG/5ML PO SYRP
5.0000 mL | ORAL_SOLUTION | Freq: Four times a day (QID) | ORAL | Status: DC | PRN
Start: 2016-08-28 — End: 2016-09-05

## 2016-08-28 MED ORDER — BISACODYL 10 MG RE SUPP
10.0000 mg | Freq: Every day | RECTAL | Status: DC | PRN
  Administered 2016-09-03 – 2016-09-05 (×2): 10 mg via RECTAL
  Filled 2016-08-28 (×2): qty 1

## 2016-08-28 MED ORDER — POLYETHYLENE GLYCOL 3350 17 G PO PACK
17.0000 g | PACK | Freq: Every day | ORAL | Status: DC | PRN
  Administered 2016-08-28: 17 g via ORAL
  Filled 2016-08-28: qty 1

## 2016-08-28 MED ORDER — ALUM & MAG HYDROXIDE-SIMETH 200-200-20 MG/5ML PO SUSP: 15.0000 mL | ORAL | Status: DC | PRN

## 2016-08-28 MED ORDER — TRAMADOL HCL 50 MG PO TABS: 50.0000 mg | ORAL_TABLET | Freq: Three times a day (TID) | ORAL | Status: DC | PRN

## 2016-08-28 MED ORDER — ADULT MULTIVITAMIN W/MINERALS CH
1.0000 | ORAL_TABLET | Freq: Every day | ORAL | Status: DC
  Administered 2016-08-29 – 2016-09-05 (×8): 1 via ORAL
  Filled 2016-08-28 (×9): qty 1

## 2016-08-28 MED ORDER — TRAMADOL HCL 50 MG PO TABS
50.0000 mg | ORAL_TABLET | Freq: Three times a day (TID) | ORAL | Status: DC | PRN
  Filled 2016-08-28: qty 1

## 2016-08-28 MED ORDER — APIXABAN 5 MG PO TABS: 5.0000 mg | ORAL_TABLET | Freq: Two times a day (BID) | ORAL | Status: DC

## 2016-08-28 MED ORDER — APIXABAN 5 MG PO TABS
5.0000 mg | ORAL_TABLET | ORAL | Status: AC
  Administered 2016-08-28: 5 mg via ORAL
  Filled 2016-08-28: qty 1

## 2016-08-28 MED ORDER — DIPHENHYDRAMINE HCL 12.5 MG/5ML PO ELIX
12.5000 mg | ORAL_SOLUTION | Freq: Four times a day (QID) | ORAL | Status: DC | PRN
Start: 2016-08-28 — End: 2016-09-05

## 2016-08-28 MED ORDER — FLEET ENEMA 7-19 GM/118ML RE ENEM: 1.0000 | ENEMA | Freq: Once | RECTAL | Status: DC | PRN

## 2016-08-28 MED ORDER — PANTOPRAZOLE SODIUM 40 MG PO TBEC
40.0000 mg | DELAYED_RELEASE_TABLET | Freq: Every day | ORAL | Status: DC
  Administered 2016-08-29 – 2016-09-05 (×8): 40 mg via ORAL
  Filled 2016-08-28 (×8): qty 1

## 2016-08-28 MED ORDER — TRIAMCINOLONE ACETONIDE 0.1 % EX CREA
1.0000 "application " | TOPICAL_CREAM | Freq: Every day | CUTANEOUS | Status: DC | PRN
  Administered 2016-08-29 – 2016-08-31 (×2): 1 via TOPICAL
  Filled 2016-08-28 (×2): qty 15

## 2016-08-28 MED ORDER — DOCUSATE SODIUM 100 MG PO CAPS
100.0000 mg | ORAL_CAPSULE | Freq: Every day | ORAL | Status: DC
  Administered 2016-08-29 – 2016-09-05 (×8): 100 mg via ORAL
  Filled 2016-08-28 (×11): qty 1

## 2016-08-28 MED ORDER — LEVOTHYROXINE SODIUM 112 MCG PO TABS
112.0000 ug | ORAL_TABLET | Freq: Every day | ORAL | Status: DC
  Administered 2016-08-29 – 2016-09-05 (×8): 112 ug via ORAL
  Filled 2016-08-28 (×8): qty 1

## 2016-08-28 MED ORDER — APIXABAN 5 MG PO TABS: 5.0000 mg | ORAL_TABLET | Freq: Two times a day (BID) | ORAL | 3 refills | Status: DC

## 2016-08-28 MED ORDER — PHENOL 1.4 % MT LIQD: 1.0000 | OROMUCOSAL | Status: DC | PRN

## 2016-08-28 MED ORDER — TRAZODONE HCL 50 MG PO TABS: 25.0000 mg | ORAL_TABLET | Freq: Every evening | ORAL | Status: DC | PRN

## 2016-08-28 MED ORDER — PROCHLORPERAZINE MALEATE 5 MG PO TABS: 5.0000 mg | ORAL_TABLET | Freq: Four times a day (QID) | ORAL | Status: DC | PRN

## 2016-08-28 MED ORDER — AMIODARONE HCL 100 MG PO TABS
50.0000 mg | ORAL_TABLET | Freq: Every day | ORAL | Status: DC
  Administered 2016-08-29 – 2016-09-05 (×8): 50 mg via ORAL
  Filled 2016-08-28 (×8): qty 1

## 2016-08-28 MED ORDER — HYDROCORTISONE 1 % EX CREA
TOPICAL_CREAM | Freq: Three times a day (TID) | CUTANEOUS | Status: DC
  Administered 2016-08-28 – 2016-09-02 (×12): via TOPICAL
  Filled 2016-08-28: qty 28

## 2016-08-28 MED ORDER — DIPHENHYDRAMINE HCL 25 MG PO CAPS: 25.0000 mg | ORAL_CAPSULE | Freq: Three times a day (TID) | ORAL | Status: DC | PRN

## 2016-08-28 NOTE — Care Management Note (Signed)
Case Management Note Previous CM note initiated by Reinaldo Raddle, RN, BSN --Trauma/Neuro ICU Case Manager [phone]    Patient Details  Name: Christine Pugh MRN: [id number] Date of Birth: Apr 16, 1922  Subjective/Objective:  Pt admitted on 08/23/16 s/p Lt cerebellar stroke with Lt axillary embolus.  PTA, pt very independent, lives alone.                     Action/Plan: PT/OT evaluations pending.  Case manager will follow for discharge planning needs as pt progresses.    Expected Discharge Date:  08/28/16               Expected Discharge Plan:  [place]  In-House Referral:  Clinical Social Work  Discharge planning Services  CM Consult  Post Acute Care Choice:  NA Choice offered to:  NA  DME Arranged:    DME Agency:     HH Arranged:    [place] Agency:     Status of Service:  Completed, signed off  If discussed at [employer] of Stay Meetings, dates discussed:    Discharge Disposition: CIR   Additional Comments:  08/28/16- 1320- Zebulan Hinshaw RN, CM- pt for d/c today- per Pamala Hurry with CIR- pt has been extended a bed offer for CIR and has accepted bed offer- plan to d/c later today to [facility] for further rehab.   08/27/16- 1600- Vyron Fronczak RN, CM- spoke with Lenna Sciara with CIR- they are following for possible admission pending bed availability and pt medical readiness- CSW following for possible STSNF backup- will check with CIR in am to see if they have bed available.   Marvetta Gibbons Therapist, sports, Occupational hygienist [phone]

## 2016-08-28 NOTE — Progress Notes (Signed)
Called report to nurse for room one (1)  On 4W.  Room is not ready, report was given and nurse will call back when room is open.

## 2016-08-28 NOTE — Telephone Encounter (Signed)
spoke to pt, req letter be sent, verified address  Appt 2/15 at 2 pm

## 2016-08-28 NOTE — Progress Notes (Signed)
ANTICOAGULATION CONSULT NOTE - Follow Up Consult  Pharmacy Consult for heparin Indication: Afib/DVT/CVA  Labs:  Recent Labs  08/26/16 0347  08/26/16 1832 08/27/16 0324 08/28/16 0306  HGB 10.7*  --   --  9.9* 9.5*  HCT 32.9*  --   --  30.9* 29.3*  PLT 172  --   --  182 195  HEPARINUNFRC 0.17*  < > 0.56 0.32 0.20*  CREATININE 0.56  --   --  0.59  --   < > = values in this interval not displayed.   Assessment: 83yo female now subtherapeutic on heparin after one level at low end of goal.  Goal of Therapy:  Heparin level 0.3-0.5 units/ml   Plan:  Will increase heparin gtt by 2 units/kg/hr to 1100 units/hr and check level in [place], PharmD, BCPS  08/28/2016,3:56 AM

## 2016-08-28 NOTE — Progress Notes (Addendum)
  [facility] Progress Note  Subjective  - POD #5  No complaints.  Objective Vitals:   08/27/16 2030 08/28/16 0405  BP:  (!) 114/91  Pulse: 68 73  Resp:  16  Temp:  98.6 F (37 C)    Intake/Output Summary (Last 24 hours) at 08/28/16 0816 Last data filed at 08/28/16 0000  Gross per 24 hour  Intake              518 ml  Output              500 ml  Net               18 ml    2+ left radial pulse. Left arm is soft with extensive ecchymosis.   Assessment/Planning: 83 y.o. female is s/p: left brachial, radial, ulnar and axillary embolectomy 5 Days Post-Op   2+ left radial pulse Possibly starting Eliquis today per neurology. Ok to CIR once Eliquis started and ok with neurology.   Alvia Grove 08/28/2016 8:16 AM -- 2+ radial neuro unchanged To Rehab today on Eliquis stop heparin ASA  Ruta Hinds, MD [facility] Office: [phone] Pager: [phone]  Laboratory CBC    Component Value Date/Time   WBC 10.2 08/28/2016 0306   HGB 9.5 (L) 08/28/2016 0306   HCT 29.3 (L) 08/28/2016 0306   PLT 195 08/28/2016 0306    BMET    Component Value Date/Time   NA 135 08/28/2016 0306   NA 135 08/28/2016 0306   K 3.7 08/28/2016 0306   K 3.7 08/28/2016 0306   CL 103 08/28/2016 0306   CL 103 08/28/2016 0306   CO2 27 08/28/2016 0306   CO2 27 08/28/2016 0306   GLUCOSE 110 (H) 08/28/2016 0306   GLUCOSE 110 (H) 08/28/2016 0306   BUN 10 08/28/2016 0306   BUN 10 08/28/2016 0306   CREATININE 0.61 08/28/2016 0306   CREATININE 0.65 08/28/2016 0306   CALCIUM 8.0 (L) 08/28/2016 0306   CALCIUM 7.9 (L) 08/28/2016 0306   GFRNONAA >60 08/28/2016 0306   GFRNONAA >60 08/28/2016 0306   GFRAA >60 08/28/2016 0306   GFRAA >60 08/28/2016 0306    COAG Lab Results  Component Value Date   INR 1.00 08/23/2016   No results found for: PTT  Antibiotics Anti-infectives    Start     Dose/Rate Route Frequency Ordered Stop   08/23/16 2200  cefUROXime (ZINACEF) 1.5 g in dextrose 5 % 50 mL IVPB     1.5 g 100 mL/hr over 30 Minutes Intravenous Every 12 hours 08/23/16 2148 08/24/16 1024   08/23/16 1553  ceFAZolin (ANCEF) 2-4 GM/100ML-% IVPB    Comments:  Marinda Elk   : cabinet override      08/23/16 1553 08/24/16 0359   08/23/16 0800  cefUROXime (ZINACEF) 1.5 g in dextrose 5 % 50 mL IVPB     1.5 g 100 mL/hr over 30 Minutes Intravenous To Short Stay 08/23/16 0749 08/23/16 [place], PA-C [facility] Office: [phone] Pager: [phone] 08/28/2016 8:16 AM  ADDENDUM: Pt has not been restarted on her beta blocker that she was on at home.  HR and BP marginal for it to be restarted.  Will defer to cardiology.   Leontine Locket, PAC. 08/28/2016 10:13 AM

## 2016-08-28 NOTE — H&P (Signed)
Physical Medicine and Rehabilitation Admission H&P       Chief Complaint  Patient presents with  . LUE weakness, left visual field deficits and balance deficits.     HPI:  Christine Aydelott McIntyreis a 83 y.o.femalewith a h/o A fib/A flutter--refused anticoagulation, aortic stenosis, HTN, piror CVA who was admitted on 08/23/16 with acute onset of LUE paralysis.  CT at [facility] revealed subacute L-cerebellar stroke and CTA LUE showed occlusion of distal left axillary artery approximately 5 cm with distal reconstitution of brachial artery at mid proximal humerus. Dr. Oneida Alar consulted and patient taken to OR for  Left brachial, radial, ulnar, axillary embolectomy the same day. Post procedure patient noted to have left facial droop, rightward gaze, slurred speech and left hemiparesis with CTA revealing R-MCA embolus. She underwent thrombectomy with  revascularization of R-MCA with restoration of TICI flow.  She was extubated without difficulty on 1/22 and maintained on IV heparin. 2D echo with EF 65-70% with mild concentric LVH and vigorous systolic function.   Dr Leonie Man felt stroke embolic due to A fib and patient continues on low dose ASA. Dr. Rayann Heman consulted for input and recommended  Eliquis once cleared by neurology.  Patient with resultant balance deficits, poor awareness of deficits, dyspnea with activity and left visual field deficits. Therapy ongoing and CIR recommended for follow up therapy.    Review of Systems  Constitutional: Positive for malaise/fatigue. Negative for fever.  HENT: Negative for hearing loss and tinnitus.   Eyes: Negative for blurred vision and double vision.  Respiratory: Positive for cough and shortness of breath.   Cardiovascular: Positive for chest pain. Negative for leg swelling.  Gastrointestinal: Negative for abdominal pain, diarrhea, heartburn, nausea and vomiting.  Genitourinary: Negative for dysuria.  Musculoskeletal: Negative for myalgias and neck pain.    Skin: Negative for rash.  Neurological: Positive for weakness. Negative for headaches.  Endo/Heme/Allergies: Bruises/bleeds easily.  Psychiatric/Behavioral: Negative for depression.          Past Medical History:  Diagnosis Date  . Aortic insufficiency    Mild  . Aortic stenosis    Moderate to severe  . Atypical atrial flutter ([place])   . Benign essential HTN   . Embolic stroke involving right cerebellar artery ([place])   . Hypothyroidism   . Ischemia of extremity          Past Surgical History:  Procedure Laterality Date  . APPENDECTOMY    . BREAST LUMPECTOMY    . dental implant    . EP IMPLANTABLE DEVICE N/A 01/10/2016   Procedure: Loop Recorder Insertion;  Surgeon: [name], MD;  Location: [facility];  Service: Cardiovascular;  Laterality: N/A;  . IR GENERIC HISTORICAL  08/23/2016   IR US GUIDE VASC ACCESS RIGHT 08/23/2016 Corrie Mckusick, DO [facility]  . IR GENERIC HISTORICAL  08/23/2016   IR PERCUTANEOUS ART THROMBECTOMY/INFUSION INTRACRANIAL INC DIAG ANGIO 08/23/2016 Corrie Mckusick, DO [facility]  . IR GENERIC HISTORICAL  08/23/2016   IR ANGIO INTRA EXTRACRAN SEL COM CAROTID INNOMINATE UNI L MOD SED 08/23/2016 Corrie Mckusick, DO [facility]  . RADIOLOGY WITH ANESTHESIA Bilateral 08/23/2016   Procedure: RADIOLOGY WITH ANESTHESIA;  Surgeon: Medication Radiologist, MD;  Location: [place];  Service: Radiology;  Laterality: Bilateral;  . THROMBECTOMY BRACHIAL ARTERY Left 08/23/2016   Procedure: Left Radial, Ulnar, Brachial and Axillary Embolectomy;  Surgeon: Elam Dutch, MD;  Location: [place];  Service: Vascular;  Laterality: Left;  . THYROIDECTOMY    .  TONSILLECTOMY    . TOTAL ABDOMINAL HYSTERECTOMY    . VITRECTOMY AND CATARACT           Family History  Problem Relation Age of Onset  . Coronary artery disease      Social History:  reports that she has never smoked. She has never used smokeless tobacco. She reports that  she does not drink alcohol or use drugs.    Allergies: No Known Allergies          Medications Prior to Admission  Medication Sig Dispense Refill  . amiodarone (PACERONE) 100 MG tablet Take 1 tablet (100 mg total) by mouth daily. (Patient taking differently: Take 50 mg by mouth daily. ) 90 tablet 3  . aspirin EC 81 MG tablet Take 81 mg by mouth daily.    . cyproheptadine (PERIACTIN) 4 MG tablet Take 4 mg by mouth 3 (three) times daily as needed for allergies. Reported on 08/27/2015    . levothyroxine (SYNTHROID, LEVOTHROID) 137 MCG tablet Take 112 mcg by mouth daily before breakfast.     . Lutein-Zeaxanthin 25-5 MG CAPS Take 1 capsule by mouth 2 (two) times daily.    . metoprolol succinate (TOPROL XL) 25 MG 24 hr tablet Take 1 tablet (25 mg total) by mouth 2 (two) times daily. 180 tablet 3  . Multiple Vitamin (MULTIVITAMIN) tablet Take 1 tablet by mouth daily.      Marland Kitchen triamcinolone cream (KENALOG) 0.1 % Apply 1 application topically daily as needed (irritated skin).     Marland Kitchen triamterene-hydrochlorothiazide (DYAZIDE) 37.5-25 MG per capsule Take 1 capsule by mouth daily.        Home: Home Living Family/patient expects to be discharged to:: Private residence Living Arrangements: Alone Available Help at Discharge: Family, Available PRN/intermittently Type of Home: Apartment Home Access: Level entry [place]: One level Bathroom Shower/Tub: Chiropodist: [place]: Tub bench, Environmental consultant - 2 wheels, Cane - single point, Toilet riser   Functional History: Prior Function Level of Independence: Independent with assistive device(s) Comments: Uses SPC for ambulation; has cleaning lady. Drives. Per chart, pt with h/o falls, but she denies this during eval   Functional Status:  Mobility: Bed Mobility Overal bed mobility: Needs Assistance Bed Mobility: Supine to Sit, Sit to Supine Supine to sit: Min assist Sit to supine: Min assist General  bed mobility comments: pt up in chair Transfers Overall transfer level: Needs assistance Equipment used: None Transfers: Sit to/from Stand, Stand Pivot Transfers Sit to Stand: Min assist Stand pivot transfers: Min assist General transfer comment: chair to [facility] back to chair Ambulation/Gait Ambulation/Gait assistance: Min assist, Mod assist Ambulation Distance (Feet): 20 Feet (x2) Assistive device: Rolling walker (2 wheeled) Gait Pattern/deviations: Step-through pattern, Decreased stride length, Shuffle General Gait Details: max v/c's for safe walker management, minimal step height, SPO2>91% on RA. pt fatigues quickly, walker adjusted for optimal height however pt c/o UEs hurting Gait velocity: decreased Gait velocity interpretation: <1.8 ft/sec, indicative of risk for recurrent falls  ADL: ADL Overall ADL's : Needs assistance/impaired Eating/Feeding: Set up, Bed level Grooming: Wash/dry hands, Wash/dry face, Oral care, Sitting, Set up Grooming Details (indicate cue type and reason): cleaned dentures Upper Body Bathing: Set up, Supervision/ safety, Sitting Lower Body Bathing: Maximal assistance, Sit to/from stand Upper Body Dressing : Minimal assistance, Sitting Lower Body Dressing: Total assistance, Sit to/from stand Toilet Transfer: Minimal assistance, Stand-pivot, [facility] Toilet Transfer Details (indicate cue type and reason): requires assist to move into standing and assist for  balance.  Toileting- Clothing Manipulation and Hygiene: Minimal assistance, Sit to/from stand Toileting - Clothing Manipulation Details (indicate cue type and reason): managed her pants with min assist to steady Functional mobility during ADLs: Moderate assistance, +2 for physical assistance General ADL Comments: Pt demonstrating ability to see Ipad screen and locate all visual targets.  Cognition: Cognition Overall Cognitive Status: Impaired/Different from baseline Orientation Level: Oriented  X4 Cognition Arousal/Alertness: Awake/alert Behavior During Therapy: WFL for tasks assessed/performed Overall Cognitive Status: Impaired/Different from baseline Area of Impairment: Attention, Problem solving, Memory, Safety/judgement Current Attention Level: Selective Following Commands: Follows multi-step commands with increased time Safety/Judgement: Decreased awareness of safety Problem Solving: Difficulty sequencing, Requires verbal cues, Requires tactile cues General Comments: Pt with difficulty attending to task due to concern with her electronics being where she could reach them and plugged in.   Blood pressure (!) 114/91, pulse 73, temperature 98.6 F (37 C), temperature source Oral, resp. rate 16, height _0  (1.6 m), weight 65.1 kg (143 lb 8.3 oz), SpO2 94 %. Physical Exam  Constitutional: She is oriented to person, place, and time.  Frail appearing female sitting in chair  HENT:  Head: Normocephalic and atraumatic.  Right Ear: External ear normal.  Left Ear: External ear normal.  Eyes: Conjunctivae and EOM are normal. Pupils are equal, round, and reactive to light.  Neck: Normal range of motion. No JVD present. No tracheal deviation present. No thyromegaly present.  Cardiovascular: Normal rate, regular rhythm and intact distal pulses.  Exam reveals no friction rub.   No murmur heard. Respiratory: No respiratory distress. She has no wheezes.  [place] with 2L oxygen. Occasionally dyspneic with conversation  GI: She exhibits no distension. There is no tenderness. There is no rebound.  Musculoskeletal:  1+ to 2+ edema LUE with associated bruising/echymoses. LUE warm with only mild tenderness  Neurological: She is alert and oriented to person, place, and time. A cranial nerve deficit is present.  Left Homonymous hemianopsia with left inattention as well. UE motor grossly   4/5 prox in RUE,. LUE 3+ to 4- deltoid, biceps, triceps, 4/5 wrist/HI. RLE: 3+ HF, KE and 4+/5 ADF/PF. LLE:  3/5 HF, 3KE and 4/5 ADF/PF Mild limb ataxia RUE and RLE. No sensory deficits.  Fair insight and awareness. STM deficits  Skin: Skin is warm.  Numerous bruises on all 4 limbs, LUE most affected. Chronic vascular changes noted especially in the LE's.   Psychiatric: She has a normal mood and affect. Her behavior is normal.    Lab Results Last 48 Hours        Results for orders placed or performed during the hospital encounter of 08/23/16 (from the past 48 hour(s))  Heparin level (unfractionated)     Status: None   Collection Time: 08/26/16  1:03 PM  Result Value Ref Range   Heparin Unfractionated 0.40 0.30 - 0.70 IU/mL    Comment:        IF HEPARIN RESULTS ARE BELOW EXPECTED VALUES, AND PATIENT DOSAGE HAS BEEN CONFIRMED, SUGGEST FOLLOW UP TESTING OF ANTITHROMBIN III LEVELS.   Heparin level (unfractionated)     Status: None   Collection Time: 08/26/16  6:32 PM  Result Value Ref Range   Heparin Unfractionated 0.56 0.30 - 0.70 IU/mL    Comment:        IF HEPARIN RESULTS ARE BELOW EXPECTED VALUES, AND PATIENT DOSAGE HAS BEEN CONFIRMED, SUGGEST FOLLOW UP TESTING OF ANTITHROMBIN III LEVELS.   Renal function panel     Status: Abnormal  Collection Time: 08/27/16  3:24 AM  Result Value Ref Range   Sodium 138 135 - 145 mmol/L   Potassium 4.2 3.5 - 5.1 mmol/L    Comment: DELTA CHECK NOTED NO VISIBLE HEMOLYSIS    Chloride 106 101 - 111 mmol/L   CO2 26 22 - 32 mmol/L   Glucose, Bld 118 (H) 65 - 99 mg/dL   BUN 11 6 - 20 mg/dL   Creatinine, Ser 0.59 0.44 - 1.00 mg/dL   Calcium 8.1 (L) 8.9 - 10.3 mg/dL   Phosphorus 1.7 (L) 2.5 - 4.6 mg/dL   Albumin 2.3 (L) 3.5 - 5.0 g/dL   GFR calc non Af Amer >60 >60 mL/min   GFR calc Af Amer >60 >60 mL/min    Comment: (NOTE) The eGFR has been calculated using the CKD EPI equation. This calculation has not been validated in all clinical situations. eGFR's persistently <60 mL/min signify possible Chronic  Kidney Disease.    Anion gap 6 5 - 15  CBC with Differential/Platelet     Status: Abnormal   Collection Time: 08/27/16  3:24 AM  Result Value Ref Range   WBC 12.1 (H) 4.0 - 10.5 K/uL   RBC 3.17 (L) 3.87 - 5.11 MIL/uL   Hemoglobin 9.9 (L) 12.0 - 15.0 g/dL   HCT 30.9 (L) 36.0 - 46.0 %   MCV 97.5 78.0 - 100.0 fL   MCH 31.2 26.0 - 34.0 pg   MCHC 32.0 30.0 - 36.0 g/dL   RDW 14.2 11.5 - 15.5 %   Platelets 182 150 - 400 K/uL   Neutrophils Relative % 78 %   Neutro Abs 9.4 (H) 1.7 - 7.7 K/uL   Lymphocytes Relative 9 %   Lymphs Abs 1.1 0.7 - 4.0 K/uL   Monocytes Relative 9 %   Monocytes Absolute 1.0 0.1 - 1.0 K/uL   Eosinophils Relative 4 %   Eosinophils Absolute 0.5 0.0 - 0.7 K/uL   Basophils Relative 0 %   Basophils Absolute 0.0 0.0 - 0.1 K/uL  Magnesium     Status: None   Collection Time: 08/27/16  3:24 AM  Result Value Ref Range   Magnesium 1.9 1.7 - 2.4 mg/dL  Heparin level (unfractionated)     Status: None   Collection Time: 08/27/16  3:24 AM  Result Value Ref Range   Heparin Unfractionated 0.32 0.30 - 0.70 IU/mL    Comment:        IF HEPARIN RESULTS ARE BELOW EXPECTED VALUES, AND PATIENT DOSAGE HAS BEEN CONFIRMED, SUGGEST FOLLOW UP TESTING OF ANTITHROMBIN III LEVELS.   Renal function panel     Status: Abnormal   Collection Time: 08/28/16  3:06 AM  Result Value Ref Range   Sodium 135 135 - 145 mmol/L   Potassium 3.7 3.5 - 5.1 mmol/L   Chloride 103 101 - 111 mmol/L   CO2 27 22 - 32 mmol/L   Glucose, Bld 110 (H) 65 - 99 mg/dL   BUN 10 6 - 20 mg/dL   Creatinine, Ser 0.61 0.44 - 1.00 mg/dL   Calcium 8.0 (L) 8.9 - 10.3 mg/dL   Phosphorus 1.9 (L) 2.5 - 4.6 mg/dL   Albumin 2.1 (L) 3.5 - 5.0 g/dL   GFR calc non Af Amer >60 >60 mL/min   GFR calc Af Amer >60 >60 mL/min    Comment: (NOTE) The eGFR has been calculated using the CKD EPI equation. This calculation has not been validated in all clinical situations. eGFR's  persistently <60 mL/min signify  possible Chronic Kidney Disease.    Anion gap 5 5 - 15  Magnesium     Status: None   Collection Time: 08/28/16  3:06 AM  Result Value Ref Range   Magnesium 1.7 1.7 - 2.4 mg/dL  CBC with Differential/Platelet     Status: Abnormal   Collection Time: 08/28/16  3:06 AM  Result Value Ref Range   WBC 10.2 4.0 - 10.5 K/uL   RBC 3.03 (L) 3.87 - 5.11 MIL/uL   Hemoglobin 9.5 (L) 12.0 - 15.0 g/dL   HCT 29.3 (L) 36.0 - 46.0 %   MCV 96.7 78.0 - 100.0 fL   MCH 31.4 26.0 - 34.0 pg   MCHC 32.4 30.0 - 36.0 g/dL   RDW 14.3 11.5 - 15.5 %   Platelets 195 150 - 400 K/uL   Neutrophils Relative % 71 %   Neutro Abs 7.3 1.7 - 7.7 K/uL   Lymphocytes Relative 13 %   Lymphs Abs 1.3 0.7 - 4.0 K/uL   Monocytes Relative 11 %   Monocytes Absolute 1.2 (H) 0.1 - 1.0 K/uL   Eosinophils Relative 5 %   Eosinophils Absolute 0.5 0.0 - 0.7 K/uL   Basophils Relative 0 %   Basophils Absolute 0.0 0.0 - 0.1 K/uL  Basic metabolic panel     Status: Abnormal   Collection Time: 08/28/16  3:06 AM  Result Value Ref Range   Sodium 135 135 - 145 mmol/L   Potassium 3.7 3.5 - 5.1 mmol/L   Chloride 103 101 - 111 mmol/L   CO2 27 22 - 32 mmol/L   Glucose, Bld 110 (H) 65 - 99 mg/dL   BUN 10 6 - 20 mg/dL   Creatinine, Ser 0.65 0.44 - 1.00 mg/dL   Calcium 7.9 (L) 8.9 - 10.3 mg/dL   GFR calc non Af Amer >60 >60 mL/min   GFR calc Af Amer >60 >60 mL/min    Comment: (NOTE) The eGFR has been calculated using the CKD EPI equation. This calculation has not been validated in all clinical situations. eGFR's persistently <60 mL/min signify possible Chronic Kidney Disease.    Anion gap 5 5 - 15  Heparin level (unfractionated)     Status: Abnormal   Collection Time: 08/28/16  3:06 AM  Result Value Ref Range   Heparin Unfractionated 0.20 (L) 0.30 - 0.70 IU/mL    Comment:        IF HEPARIN RESULTS ARE BELOW EXPECTED VALUES, AND PATIENT DOSAGE HAS BEEN  CONFIRMED, SUGGEST FOLLOW UP TESTING OF ANTITHROMBIN III LEVELS.      Imaging Results (Last 48 hours)  No results found.       Medical Problem List and Plan: 1.  Functional and mobility deficits secondary to embolic infarcts involving right MCA and inferior right cerebellum             -admit to inpatient rehab              2.  DVT Prophylaxis/Anticoagulation: Mechanical: Sequential compression devices, below knee Bilateral lower extremities, eliquis 3. Pain Management: tylenol for mild pain and tramadol for more severe pain 4. Mood: team to provide ego supports             -monitor while here. Pt seems to be in reasonable spirits             -SW to screen patient 5. Neuropsych: This patient is capable of making decisions on her own behalf. 6. Skin/Wound Care: loca care to wounds, elevate  LUE for edema control             -encourage adequate nutrition 7. Fluids/Electrolytes/Nutrition: Encourage PO             -Labs during admission to follow up fluids/elecrolytes 8. A Fib: rate controlled at present. Monitor as we begin mobilizing patient more.             -amiodarone for rate control             -eliquis for anticoagulation             -wean oxygen as able 9. HTN:  Monitor bp's with increasing mobility. Diastolic elevated today             -continue dyazide daily- 10. LUE embolectomy: limb warm, still edematous             -continue to elevate             -ACE wrap if needed                -eliquis     Post Admission Physician Evaluation: 1. Functional deficits secondary  to embolic right MCA and cerebellar infarcts. 2. Patient is admitted to receive collaborative, interdisciplinary care between the physiatrist, rehab nursing staff, and therapy team. 3. Patient's level of medical complexity and substantial therapy needs in context of that medical necessity cannot be provided at a lesser intensity of care such as a SNF. 4. Patient has experienced substantial  functional loss from his/her baseline which was documented above under the "Functional History" and "Functional Status" headings.  Judging by the patient's diagnosis, physical exam, and functional history, the patient has potential for functional progress which will result in measurable gains while on inpatient rehab.  These gains will be of substantial and practical use upon discharge  in facilitating mobility and self-care at the household level. 5. Physiatrist will provide 24 hour management of medical needs as well as oversight of the therapy plan/treatment and provide guidance as appropriate regarding the interaction of the two. 6. The Preadmission Screening has been reviewed and patient status is unchanged unless otherwise stated above. 7. 24 hour rehab nursing will assist with bladder management, bowel management, safety, skin/wound care, disease management, medication administration, pain management and patient education  and help integrate therapy concepts, techniques,education, etc. 8. PT will assess and treat for/with: Lower extremity strength, range of motion, stamina, balance, functional mobility, safety, adaptive techniques and equipment, edema mgt, activity tolerance, NMR, visual-spatial awareness, family education.   Goals are: supervision. 9. OT will assess and treat for/with: ADL's, functional mobility, safety, upper extremity strength, adaptive techniques and equipment, NMR, visual-spatial awareness, edema mgt LUE, family education.   Goals are: supervision . Therapy may proceed with showering this patient. 10. SLP will assess and treat for/with: cognition, communication, family education.  Goals are: supervision. 11. Case Management and Social Worker will assess and treat for psychological issues and discharge planning. 12. Team conference will be held weekly to assess progress toward goals and to determine barriers to discharge. 13. Patient will receive at least 3 hours of therapy per  day at least 5 days per week. 14. ELOS: 13-17 days       15. Prognosis:  excellent     Meredith Staggers, MD, [place] [facility] 08/28/2016  Bary Leriche, Hershal Coria 08/28/2016

## 2016-08-28 NOTE — Progress Notes (Signed)
[name], MD Physician Signed Physical Medicine and Rehabilitation  Consult Note Date of Service: 08/26/2016 10:22 AM  Related encounter: ED to Hosp-Admission (Current) from 08/23/2016 in [place] All Collapse All   [] Hide copied text [] Hover for attribution information      Physical Medicine and Rehabilitation Consult Reason for Consult: Right cerebellar infarct Referring Physician: Dr. Oneida Alar   HPI: Christine Pugh is a 83 y.o. right handed female with history of moderate to severe aortic stenosis, atypical atrial flutter with implanted loop recorder and refused anticoagulation in the past, hypertension, aortic insufficiency as well as report of 3 falls over the last 18 months. Per chart review patient lives alone independently with assistive device prior to admission. One level home. She has a son in the area but cannot provide 24-hour care. Presented 08/23/2016 with acute onset of paralysis left arm approximately 6 hours prior to admission. Cranial CT scan at [facility] showed subacute right cerebellar infarct as well as findings of left axillary embolus. Patient was transferred to [facility] and underwent left brachial radial ulnar axillary embolectomy for acute ischemic left arm 08/23/2016 per Dr. Oneida Alar. She did remain intubated 08/24/2016 Follow-up neurology services for mental status changes, right gaze deviation and left hemiparesthesias. CT angiogram of head and neck as well as CT perfusion scan showed 40% stenosis of the proximal ICA on the left. Embolic occlusion of the right MCA at the M1 segment. 50% stenosis of both vertebral artery origins. 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 She underwent mechanical thrombectomy per interventional radiology remained intubated for a short time. Echocardiogram completed 08/24/2016 showing ejection fraction [id number] grade 2 diastolic dysfunction. Neurology follow-up  currently on heparin protocol and await plan for possible Eliquis. Occupational therapy evaluation completed with recommendations of physical medicine rehabilitation consult.  Patient states she no longer has numbness. Does not note any dizziness   Review of Systems  Constitutional: Negative for chills and fever.  HENT: Negative for ear pain, hearing loss and tinnitus.   Eyes: Negative for blurred vision and double vision.  Respiratory: Negative for cough and shortness of breath.   Cardiovascular: Positive for palpitations and leg swelling.  Gastrointestinal: Positive for constipation. Negative for nausea and vomiting.  Musculoskeletal: Positive for falls, joint pain and myalgias.  Skin: Negative for rash.  Neurological: Positive for dizziness and weakness. Negative for headaches.  All other systems reviewed and are negative.      Past Medical History:  Diagnosis Date  . Aortic insufficiency    Mild  . Aortic stenosis    Moderate to severe  . Atypical atrial flutter ([place])   . Benign essential HTN   . Embolic stroke involving right cerebellar artery ([place])   . Hypothyroidism   . Ischemia of extremity         Past Surgical History:  Procedure Laterality Date  . APPENDECTOMY    . BREAST LUMPECTOMY    . dental implant    . EP IMPLANTABLE DEVICE N/A 01/10/2016   Procedure: Loop Recorder Insertion;  Surgeon: [name], MD;  Location: [place] [facility];  Service: Cardiovascular;  Laterality: N/A;  . IR GENERIC HISTORICAL  08/23/2016   IR US GUIDE VASC ACCESS RIGHT 08/23/2016 Corrie Mckusick, DO [facility]  . IR GENERIC HISTORICAL  08/23/2016   IR PERCUTANEOUS ART THROMBECTOMY/INFUSION INTRACRANIAL INC DIAG ANGIO 08/23/2016 Corrie Mckusick, DO [facility]  . IR GENERIC HISTORICAL  08/23/2016   IR ANGIO INTRA EXTRACRAN SEL COM CAROTID INNOMINATE UNI L MOD SED 08/23/2016 Corrie Mckusick, DO [facility]  . RADIOLOGY WITH ANESTHESIA Bilateral 08/23/2016    Procedure: RADIOLOGY WITH ANESTHESIA;  Surgeon: Medication Radiologist, MD;  Location: [place];  Service: Radiology;  Laterality: Bilateral;  . THROMBECTOMY BRACHIAL ARTERY Left 08/23/2016   Procedure: Left Radial, Ulnar, Brachial and Axillary Embolectomy;  Surgeon: Elam Dutch, MD;  Location: [place];  Service: Vascular;  Laterality: Left;  . THYROIDECTOMY    . TONSILLECTOMY    . TOTAL ABDOMINAL HYSTERECTOMY    . VITRECTOMY AND CATARACT          Family History  Problem Relation Age of Onset  . Coronary artery disease     Social History:  reports that she has never smoked. She has never used smokeless tobacco. She reports that she does not drink alcohol or use drugs. Allergies: No Known Allergies       Medications Prior to Admission  Medication Sig Dispense Refill  . amiodarone (PACERONE) 100 MG tablet Take 1 tablet (100 mg total) by mouth daily. (Patient taking differently: Take 50 mg by mouth daily. ) 90 tablet 3  . aspirin EC 81 MG tablet Take 81 mg by mouth daily.    . cyproheptadine (PERIACTIN) 4 MG tablet Take 4 mg by mouth 3 (three) times daily as needed for allergies. Reported on 08/27/2015    . levothyroxine (SYNTHROID, LEVOTHROID) 137 MCG tablet Take 112 mcg by mouth daily before breakfast.     . Lutein-Zeaxanthin 25-5 MG CAPS Take 1 capsule by mouth 2 (two) times daily.    . metoprolol succinate (TOPROL XL) 25 MG 24 hr tablet Take 1 tablet (25 mg total) by mouth 2 (two) times daily. 180 tablet 3  . Multiple Vitamin (MULTIVITAMIN) tablet Take 1 tablet by mouth daily.      Marland Kitchen triamcinolone cream (KENALOG) 0.1 % Apply 1 application topically daily as needed (irritated skin).     Marland Kitchen triamterene-hydrochlorothiazide (DYAZIDE) 37.5-25 MG per capsule Take 1 capsule by mouth daily.        Home: Home Living Family/patient expects to be discharged to:: Private residence Living Arrangements: Alone Available Help at Discharge: Family, Available  PRN/intermittently Type of Home: Apartment Home Access: Level entry [place]: One level Bathroom Shower/Tub: Chiropodist: Blades: Tub bench, Environmental consultant - 2 wheels, Cane - single point, Toilet riser  Functional History: Prior Function Level of Independence: Independent with assistive device(s) Comments: Uses SPC for ambulation; has cleaning lady. Drives. Per chart, pt with h/o falls, but she denies this during eval  Functional Status:  Mobility: Bed Mobility Overal bed mobility: Needs Assistance Bed Mobility: Supine to Sit, Sit to Supine Supine to sit: Min assist Sit to supine: Min assist General bed mobility comments: assist to initiate activity and assist to move LEs off and on bed  Transfers Overall transfer level: Needs assistance Equipment used: 2 person hand held assist Transfers: Sit to/from Stand, Stand Pivot Transfers Sit to Stand: Mod assist, +2 physical assistance Stand pivot transfers: Mod assist, +2 physical assistance General transfer comment: Pt requires assist to move into standing and assist for balance and to help pivot during transfer  Ambulation/Gait Ambulation/Gait assistance: Mod assist, +2 physical assistance, +2 safety/equipment Ambulation Distance (Feet): 20 Feet (x2 bouts) Assistive device: 2 person hand held assist Gait Pattern/deviations: Step-to pattern, Step-through pattern, Decreased step length - right, Decreased step length - left, Shuffle, Trunk flexed General  Gait Details: Pt with slow, unsteady gait with cues to progress LEs; 2/4 DOE. Sp02 dropped to 87% on 2L/min 02. Cues for pursed lip breathing. Leaning heavily right/left. Gait velocity: decreased Gait velocity interpretation: <1.8 ft/sec, indicative of risk for recurrent falls  ADL: ADL Overall ADL's : Needs assistance/impaired Eating/Feeding: Set up, Bed level Grooming: Wash/dry hands, Wash/dry face, Oral care, Brushing hair, Moderate assistance,  Standing Upper Body Bathing: Set up, Supervision/ safety, Sitting Lower Body Bathing: Maximal assistance, Sit to/from stand Upper Body Dressing : Minimal assistance, Sitting Lower Body Dressing: Total assistance, Sit to/from stand Toilet Transfer: Moderate assistance, +2 for physical assistance, Ambulation, Comfort height toilet Toilet Transfer Details (indicate cue type and reason): requires assist to move into standing and assist for balance.  Toileting- Clothing Manipulation and Hygiene: Total assistance, Sit to/from stand Toileting - Clothing Manipulation Details (indicate cue type and reason): She is able to assist minimally with pulling underpants over left hip  Functional mobility during ADLs: Moderate assistance, +2 for physical assistance  Cognition: Cognition Overall Cognitive Status: Impaired/Different from baseline Orientation Level: Oriented X4 Cognition Arousal/Alertness: Awake/alert Behavior During Therapy: WFL for tasks assessed/performed Overall Cognitive Status: Impaired/Different from baseline Area of Impairment: Attention, Following commands, Problem solving Current Attention Level: Selective Following Commands: Follows multi-step commands with increased time (and verbal cues ) Problem Solving: Difficulty sequencing, Requires verbal cues, Requires tactile cues General Comments: Pt required min cues for safety while ambulating into bathroom.  She demonstrated difficulty sequencing task in novel environment.  Requires instructions to be repeated several times - HOH may be contributing   Blood pressure (!) 124/51, pulse 72, temperature 97.6 F (36.4 C), temperature source Oral, resp. rate (!) 22, height 5\' 3"  (1.6 m), weight 65.1 kg (143 lb 8.3 oz), SpO2 (!) 77 %. Physical Exam  Vitals reviewed. Constitutional: She is oriented to person, place, and time.  HENT:  Head: Normocephalic.  Eyes:  Pupils reactive to light  Neck: Normal range of motion. Neck supple. No  thyromegaly present.  Cardiovascular:  Cardiac rate controlled  Respiratory: Effort normal and breath sounds normal. No respiratory distress.  GI: Soft. Bowel sounds are normal. She exhibits no distension.  Neurological: She is alert and oriented to person, place, and time.  Skin:  Left arm with severe bruising ecchymosis ischemic changes  motor strength 4 plus bilateral deltoid, biceps, triceps, grip 4 minus bilateral hip flexor, knee extensor, ankle dorsal flexor. Sensation absent to light touch and proprioception and deep touch in the left upper limb. Evidence of left neglect on confrontation testing  Lab Results Last 24 Hours       Results for orders placed or performed during the hospital encounter of 08/23/16 (from the past 24 hour(s))  Heparin level (unfractionated)     Status: None   Collection Time: 08/25/16  2:11 PM  Result Value Ref Range   Heparin Unfractionated 0.36 0.30 - 0.70 IU/mL  Heparin level (unfractionated)     Status: None   Collection Time: 08/25/16  9:49 PM  Result Value Ref Range   Heparin Unfractionated 0.33 0.30 - 0.70 IU/mL  Renal function panel     Status: Abnormal   Collection Time: 08/26/16  3:47 AM  Result Value Ref Range   Sodium 140 135 - 145 mmol/L   Potassium 3.0 (L) 3.5 - 5.1 mmol/L   Chloride 109 101 - 111 mmol/L   CO2 26 22 - 32 mmol/L   Glucose, Bld 123 (H) 65 - 99 mg/dL   BUN  9 6 - 20 mg/dL   Creatinine, Ser 0.56 0.44 - 1.00 mg/dL   Calcium 8.1 (L) 8.9 - 10.3 mg/dL   Phosphorus 1.6 (L) 2.5 - 4.6 mg/dL   Albumin 2.4 (L) 3.5 - 5.0 g/dL   GFR calc non Af Amer >60 >60 mL/min   GFR calc Af Amer >60 >60 mL/min   Anion gap 5 5 - 15  Heparin level (unfractionated)     Status: Abnormal   Collection Time: 08/26/16  3:47 AM  Result Value Ref Range   Heparin Unfractionated 0.17 (L) 0.30 - 0.70 IU/mL  CBC with Differential/Platelet     Status: Abnormal   Collection Time: 08/26/16  3:47 AM  Result Value Ref Range   WBC  11.2 (H) 4.0 - 10.5 K/uL   RBC 3.37 (L) 3.87 - 5.11 MIL/uL   Hemoglobin 10.7 (L) 12.0 - 15.0 g/dL   HCT 32.9 (L) 36.0 - 46.0 %   MCV 97.6 78.0 - 100.0 fL   MCH 31.8 26.0 - 34.0 pg   MCHC 32.5 30.0 - 36.0 g/dL   RDW 14.3 11.5 - 15.5 %   Platelets 172 150 - 400 K/uL   Neutrophils Relative % 76 %   Neutro Abs 8.5 (H) 1.7 - 7.7 K/uL   Lymphocytes Relative 11 %   Lymphs Abs 1.2 0.7 - 4.0 K/uL   Monocytes Relative 9 %   Monocytes Absolute 1.0 0.1 - 1.0 K/uL   Eosinophils Relative 4 %   Eosinophils Absolute 0.5 0.0 - 0.7 K/uL   Basophils Relative 0 %   Basophils Absolute 0.0 0.0 - 0.1 K/uL  Magnesium     Status: None   Collection Time: 08/26/16  3:47 AM  Result Value Ref Range   Magnesium 1.9 1.7 - 2.4 mg/dL      Imaging Results (Last 48 hours)  Ct Head Wo Contrast  Result Date: 08/26/2016 CLINICAL DATA:  Recent infarction, followup EXAM: CT HEAD WITHOUT CONTRAST TECHNIQUE: Contiguous axial images were obtained from the base of the skull through the vertex without intravenous contrast. COMPARISON:  CT brain scan of 08/24/2016 FINDINGS: Brain: Evolutionary changes of the previously noted right cerebellar infarct are noted. No acute hemorrhage is seen. The fourth ventricle remains patent, and there is no present evidence of developing hydrocephalus. However, there is poor defined low-attenuation within the distribution of the right middle cerebral artery as well as in the right insular ribbon, suspicious for edema from recent infarcts as well. If more sensitive assessment is warranted, MRI of the brain would be recommended. No hemorrhage is seen. The septum is minimally to the left of midline by 2 mm. Diffuse changes of atrophy. Again are noted as are moderate small vessel ischemic changes throughout the periventricular white matter. Vascular: No vascular abnormality is seen on this unenhanced study. Skull: On bone window images, no acute calvarial abnormality is noted.  Sinuses/Orbits: The paranasal sinuses are well pneumatized. Other: None. IMPRESSION: 1. Evolutionary changes of previously noted right cerebellar infarct. No evidence of developing hydrocephalus. No acute hemorrhage. 2. Probable interval edema within the right middle cerebral artery distribution including the right insular ribbon suspicious for recent foci of infarction as well. Consider MRI for further assessment. 3. Slight deviation of the septum to the left by 2 mm. Electronically Signed   By: Ivar Drape M.D.   On: 08/26/2016 08:13   Ct Head Wo Contrast  Result Date: 08/24/2016 CLINICAL DATA:  83 year old female with right middle cerebral artery occlusion post revascularization.  Subsequent encounter. EXAM: CT HEAD WITHOUT CONTRAST TECHNIQUE: Contiguous axial images were obtained from the base of the skull through the vertex without intravenous contrast. COMPARISON:  08/23/2016 head CT. FINDINGS: Brain: Right posterior inferior cerebellar artery distribution infarct involving majority of inferior aspect the right cerebellum with swelling and narrowing of the outlet of the fourth ventricle. This will need to be monitored closely as the patient may be at risk for development of hydrocephalus. Question tiny petechial hemorrhage along the periphery of this infarct. Interval clearing of contrast enhancement involving right subinsular and operculum region. Now noted is an acute nonhemorrhagic infarct of the right subinsular/ peri operculum region. Global atrophy without hydrocephalus. No intracranial mass lesion noted on this unenhanced exam. Vascular: Vascular calcifications. Skull: No acute abnormality. Sinuses/Orbits: No acute orbital abnormality post lens replacement. Mild mucosal thickening sphenoid sinus bilaterally. Other: None. IMPRESSION: Right posterior inferior cerebellar artery distribution infarct involving majority of inferior aspect the right cerebellum with swelling and narrowing of the outlet of  the fourth ventricle. This will need to be monitored closely as the patient may be at risk for development of hydrocephalus. Question tiny petechial hemorrhage along the periphery of this infarct. Interval clearing of contrast enhancement involving right subinsular and operculum region. Now noted is an acute nonhemorrhagic infarct of the right subinsular/ peri operculum region. Electronically Signed   By: Genia Del M.D.   On: 08/24/2016 18:24     Assessment/Plan: Diagnosis: right MCA infarct with left neglect, balance deficits due to stroke, left upper limb. Sensory deficits and poor awareness of deficits 1. Does the need for close, 24 hr/day medical supervision in concert with the patient's rehab needs make it unreasonable for this patient to be served in a less intensive setting? Yes 2. Co-Morbidities requiring supervision/potential complications: aortic stenosis, atrial flutter 3. Due to bladder management, bowel management, safety, skin/wound care, disease management, medication administration, pain management and patient education, does the patient require 24 hr/day rehab nursing? Yes 4. Does the patient require coordinated care of a physician, rehab nurse, PT (1-2 hrs/day, 5 days/week), OT (1-2 hrs/day, 5 days/week) and SLP (.5-1 hrs/day, 6 days/week) to address physical and functional deficits in the context of the above medical diagnosis(es)? Yes Addressing deficits in the following areas: balance, endurance, locomotion, strength, transferring, bowel/bladder control, bathing, dressing, feeding, grooming, toileting, cognition and psychosocial support 5. Can the patient actively participate in an intensive therapy program of at least 3 hrs of therapy per day at least 5 days per week? Yes 6. The potential for patient to make measurable gains while on inpatient rehab is excellent 7. Anticipated functional outcomes upon discharge from inpatient rehab are supervision  with PT, supervision with  OT, supervision with SLP. 8. Estimated rehab length of stay to reach the above functional goals is: 14-17d 9. Does the patient have adequate social supports and living environment to accommodate these discharge functional goals? Yes 10. Anticipated D/C setting: Home 11. Anticipated post D/C treatments: [place] therapy 12. Overall Rehab/Functional Prognosis: good  RECOMMENDATIONS: This patient's condition is appropriate for continued rehabilitative care in the following setting: CIR Patient has agreed to participate in recommended program. Potentially Note that insurance prior authorization may be required for reimbursement for recommended care.  Comment: need to confirm supervision available post discharge, neuro to review CT scan today and decide whether IV heparin can be changed to Eliquis [name] M.D. [facility] FAAPM&R (Sports Med, Neuromuscular Med) Diplomate Am Board of Electrodiagnostic Med   [facility]  J., PA-C 08/26/2016    Revision History                   Routing History

## 2016-08-28 NOTE — Progress Notes (Signed)
ANTICOAGULATION CONSULT NOTE - Initial Consult  Pharmacy Consult for Eliquis Indication: h/o afib, new CVA, DVT s/p embolectomy  No Known Allergies  Patient Measurements: Height: 5\' 3"  (160 cm) Weight: 143 lb 8.3 oz (65.1 kg) IBW/kg (Calculated) : 52.4   Vital Signs: Temp: 98.6 F (37 C) (01/26 0405) Temp Source: Oral (01/26 0405) BP: 114/91 (01/26 0405) Pulse Rate: 73 (01/26 0405)  Labs:  Recent Labs  08/26/16 0347  08/27/16 0324 08/28/16 0306 08/28/16 1143  HGB 10.7*  --  9.9* 9.5*  --   HCT 32.9*  --  30.9* 29.3*  --   PLT 172  --  182 195  --   HEPARINUNFRC 0.17*  < > 0.32 0.20* 0.42  CREATININE 0.56  --  0.59 0.61  0.65  --   < > = values in this interval not displayed.  Estimated Creatinine Clearance (by C-G formula based on SCr of 0.61 mg/dL) Female: 39.9 mL/min Female: 46.4 mL/min   Medical History: Past Medical History:  Diagnosis Date  . Aortic insufficiency    Mild  . Aortic stenosis    Moderate to severe  . Atypical atrial flutter ([place])   . Benign essential HTN   . Embolic stroke involving right cerebellar artery ([place])   . Hypothyroidism   . Ischemia of extremity     Assessment: 83 y/o F initially presented as a code stroke and found to have a moderate sub-acute infarct. However, additional testing (CT Angio arm) revealed an occluded left axillary artery clot. Pt transferring from [facility] to [facility] for vascular consult. Starting low dose heparin with no bolus in setting of sub-acute stroke. CBC/renal function good. Baseline INR is normal. PTA meds reviewed.   Significant events: History of afib refused anticoagulation in past.  3 falls last 18 months.   Anticoag: CVA w/ hx afib  and acute LUE DVT s/p revascularization on 1/21 (s/p  left brachial, radial, ulnar and axillary embolectomy) with new facial droop and AMS. CT confirms new stroke- thought to be embolic. Due to time course of events and recent operation, not giving tPA. May transition to [place]  if CT head unchanged.  CBC stable. Start eliquis - 1/26 VS note: Cardiology was also consulted and felt that even given her age, anticoagulation with Eliquis is appropriate at 5mg  bid.      Goal of Therapy:  Therapeutic oral anticoagulation Monitor platelets by anticoagulation protocol: Yes   Plan:  D/c IV heparin Eliquis 5mg  BID CIR   Christine Pugh S. Alford [place]land, PharmD, BCPS Clinical Staff Pharmacist Pager [phone]  Christine Pugh 08/28/2016,12:41 PM

## 2016-08-28 NOTE — Progress Notes (Signed)
ANTICOAGULATION CONSULT NOTE - Initial Consult  Pharmacy Consult for Apixaban Indication: h/o afib, new CVA, DVT s/p embolectomy  No Known Allergies  Patient Measurements: Weight: 168 lb (76.2 kg)  Vital Signs: Temp: 98.1 F (36.7 C) (01/26 1314) Temp Source: Oral (01/26 1314) BP: 118/38 (01/26 1314) Pulse Rate: 67 (01/26 1314)  Labs:  Recent Labs  08/26/16 0347  08/27/16 0324 08/28/16 0306 08/28/16 1143  HGB 10.7*  --  9.9* 9.5*  --   HCT 32.9*  --  30.9* 29.3*  --   PLT 172  --  182 195  --   HEPARINUNFRC 0.17*  < > 0.32 0.20* 0.42  CREATININE 0.56  --  0.59 0.61  0.65  --   < > = values in this interval not displayed.  Estimated Creatinine Clearance (by C-G formula based on SCr of 0.61 mg/dL) Female: 42.9 mL/min Female: 52.7 mL/min   Medical History: Past Medical History:  Diagnosis Date  . Aortic insufficiency    Mild  . Aortic stenosis    Moderate to severe  . Atypical atrial flutter ([place])   . Benign essential HTN   . Embolic stroke involving right cerebellar artery ([place])   . Hypothyroidism   . Ischemia of extremity     Medications:  Scheduled:  . [START ON 08/29/2016] amiodarone  50 mg Oral Daily  . apixaban  5 mg Oral BID  . atorvastatin  20 mg Oral q1800  . [START ON 08/29/2016] docusate sodium  100 mg Oral Daily  . hydrocortisone cream   Topical TID  . ipratropium-albuterol  3 mL Nebulization BID  . [START ON 08/29/2016] levothyroxine  112 mcg Oral QAC breakfast  . [START ON 08/29/2016] multivitamin with minerals  1 tablet Oral Daily  . [START ON 08/29/2016] pantoprazole  40 mg Oral Daily  . [START ON 08/29/2016] triamterene-hydrochlorothiazide  1 capsule Oral Daily    Assessment: 83 y/o F initially presented as a code stroke and found to have a moderate sub-acute infarct. However, additional testing (CT Angio arm) revealed an occluded left axillary artery clot. Pt transferring from [facility] to [facility] for vascular consult. Heparin d/c'd earlier today while  on 2W, and first dose of Apixaban given ~1PM.    Significant events: History of afib refused anticoagulation in past. 3 falls last 18 months.   Anticoag: CVA w/ hx afib  and acute LUE DVT s/p revascularization on 1/21 (s/p left brachial, radial, ulnar and axillary embolectomy) with new facial droop and AMS. CT confirms new stroke- thought to be embolic. Due to time course of events and recent operation, not giving tPA. May transition to [place] if CT head unchanged.  CBC stable. Start eliquis - 1/26 VS note: Cardiology was also consulted and felt that even given her age, anticoagulation with Eliquis is appropriate at 5mg  bid.   Goal of Therapy:  Therapeutic anticoagulation Monitor platelets by anticoagulation protocol: Yes   Plan:  Continue Apixaban 5mg  bid- next dose tonight Watch for s/s of bleeding   Gracy Bruins, PharmD Clinical Pharmacist [facility]

## 2016-08-28 NOTE — Progress Notes (Signed)
Physical Therapy Treatment Patient Details Name: Christine Pugh MRN: [id number] DOB: 1922/01/26 Today's Date: 08/28/2016    History of Present Illness This 83 y.o. female admitted with acute onset of Lt UE paralysis.  CT of head revealed Lt cerebellar subacute infarct and CT of UE showed Lt axillary embolus.  She was transferred to [facility] and underwent Lt brachial radial ulnar axillary embolectomy.  Pt developed neuro decline post up.  Repeat head CT old Lt cerebellar infarct, subacute Rt cerebellar infarct and Rt MCA clot visible - likely due to A-Fib.   She underwent cerebral thrombectomy and complete revascularization.   She was extubated 08/24/16.  PMH includes:  Atypical a-flutter, aortic stenosis, h/o 3 falls in 18 mos,     PT Comments    Pt pleasant and willing to mobilize but distraught over not receiving a lunch tray. Pt required assist to manage garments for toileting, cues for all transfers and safety. Pt on 2L on arrival, attempted RA with sats dropping to 84%, 1 min on 2L with cue for pursed lip breathing to recover to 93%. On 3L with gait pt dropping to 86% with cues throughout for breathing technique. Gait limited by fatigue and pulmonary status. Will continue to follow and encouraged mobility with nursing.   HR 69  Follow Up Recommendations  CIR;Supervision/Assistance - 24 hour     Equipment Recommendations       Recommendations for Other Services       Precautions / Restrictions Precautions Precautions: Fall Precaution Comments: watch sats Restrictions Weight Bearing Restrictions: No    Mobility  Bed Mobility               General bed mobility comments: pt up in chair  Transfers Overall transfer level: Needs assistance   Transfers: Sit to/from Stand Sit to Stand: Min guard         General transfer comment: cues for hand placement, safety, RW position   Ambulation/Gait Ambulation/Gait assistance: Min assist Ambulation Distance (Feet): 35  Feet Assistive device: Rolling walker (2 wheeled) Gait Pattern/deviations: Step-through pattern;Decreased stride length;Trunk flexed   Gait velocity interpretation: Below normal speed for age/gender General Gait Details: cues for posture, position in RW ,safety and management of RW. Pt running into objects x 2, staying too posterior to RW and trying to ditch RW prior to sitting. Distance limited by fatigue. pt walked 5' to commode then additional 35'.    Stairs            Wheelchair Mobility    Modified Rankin (Stroke Patients Only) Modified Rankin (Stroke Patients Only) Pre-Morbid Rankin Score: Slight disability Modified Rankin: Moderately severe disability     Balance Overall balance assessment: Needs assistance   Sitting balance-Leahy Scale: Good       Standing balance-Leahy Scale: Poor                      Cognition Arousal/Alertness: Awake/alert Behavior During Therapy: WFL for tasks assessed/performed Overall Cognitive Status: Impaired/Different from baseline Area of Impairment: Attention;Problem solving;Memory;Safety/judgement   Current Attention Level: Selective   Following Commands: Follows multi-step commands with increased time Safety/Judgement: Decreased awareness of safety   Problem Solving: Requires verbal cues      Exercises General Exercises - Lower Extremity Long Arc Quad: AROM;Both;Seated;15 reps Hip ABduction/ADduction: AROM;Both;Seated;15 reps Hip Flexion/Marching: AROM;Both;Seated;15 reps    General Comments        Pertinent Vitals/Pain Pain Assessment: No/denies pain    Home Living  Prior Function            PT Goals (current goals can now be found in the care plan section) Progress towards PT goals: Progressing toward goals    Frequency           PT Plan Current plan remains appropriate    Co-evaluation             End of Session Equipment Utilized During Treatment: Gait  belt;Oxygen Activity Tolerance: Patient tolerated treatment well Patient left: in chair;with call bell/phone within Pugh     Time: 1247-1311 PT Time Calculation (min) (ACUTE ONLY): 24 min  Charges:  $Gait Training: 8-22 mins $Therapeutic Activity: 8-22 mins                    G Codes:      Christine Pugh 09-22-16, 1:19 PM Christine Pugh, [place]

## 2016-08-28 NOTE — Progress Notes (Addendum)
I met with pt at bedside to offer an inpt rehab admission today. She is in agreement. I will update RN CM and SW. I discussed with RN, we can admit pt on IV heparin. She will contact Attending MD for d/c order and clarification of IV heparin. I will make the arrangements to admit today. [phone]

## 2016-08-28 NOTE — Discharge Summary (Signed)
Discharge Summary    Christine Pugh 1922/03/12 83 y.o. female  [id number]  Admission Date: 08/23/2016  Discharge Date: 08/28/16  Physician: Elam Dutch, MD  Admission Diagnosis: Arterial occlusion [facility]) [I74.9] Left upper arm pain [M79.622]   HPI:   This is a 83 y.o. female with acute onset paralysis left arm about 6 hours ago.  Pt now is slowly getting return of function of left arm but still does not feel totally normal.  History of afib refused anticoagulation in past.  3 falls last 18 months.  Lives fully independent.  Denies prior stroke or MI.  Afib followed by Dr Rayann Heman.  No chest pain or dizziness today.  Work up at [facility] shows left cerebellar stroke subacute, left axillary embolus by head and left arm CT.   Other medical problems include hypertension which has been controlled  Hospital Course:  The patient was admitted to the hospital and taken to the operating room on 08/23/2016 and underwent: Left brachial, radial, ulnar, axillary embolectomy.   The pt tolerated the procedure well and was transported to the PACU in good condition.   Her heparin was to restart at noon.  While in the recovery room, the pt developed new left sided facial droop, left hemiplegia with neglect and slurred speech.  She was oriented to self, time and situation but more lethargic than 1pm assessment.  A code stroke was called.   The heparin was turned off and she was taken to CT to make sure she hand not had an intracranial bleed.   Neuro was at bedside and it looks as if she has new stokes on head CT.  Her son was updated.  Given her age and recent operation, she was not given tpa.   Neuro IR was consulted.  She then underwent percutaneous arterial thrombectomy.    By POD 1, she was in the neuro ICU and intubated.  She was awake and motioning for the endotracheal tube to be removed.  She was alert and following commands and moving all extremities.   Awaiting anticoagulation to be  restarted once ok with neuro.  She was extubated later that morning.   She did have a 2D echocardiogram and the results as follows: Study Conclusions  - Left ventricle: The cavity size was normal. There was mild   concentric hypertrophy. Systolic function was vigorous. The   estimated ejection fraction was in the range of 65% to 70%. Wall   motion was normal; there were no regional wall motion   abnormalities. Features are consistent with a pseudonormal left   ventricular filling pattern, with concomitant abnormal relaxation   and increased filling pressure (grade 2 diastolic dysfunction).   Doppler parameters are consistent with elevated ventricular   end-diastolic filling pressure. - Aortic valve: There was moderate stenosis. There was mild   regurgitation. Mean gradient (S): 44 mm Hg. Peak gradient (S): 74   mm Hg. Valve area (VTI): 0.64 cm^2. Valve area (Vmax): 0.59 cm^2.   Valve area (Vmean): 0.65 cm^2. - Aortic root: The aortic root was normal in size. - Mitral valve: There was mild regurgitation. - Left atrium: The atrium was moderately dilated. - Right ventricle: Systolic function was normal. - Tricuspid valve: There was mild regurgitation. - Pulmonary arteries: Systolic pressure was mildly increased. PA   peak pressure: 33 mm Hg (S). - Inferior vena cava: The vessel was normal in size. - Pericardium, extracardiac: There was no pericardial effusion.  Impressions:  - Severe aortic stenosis. ---  this is stable and no change at this time per EP.  Speech was consulted.  She did not have any overt s/s of aspiration and recommended starting regular textures and thin liquids.  She was restarted on her heparin.   They followed up the next day and all goals were met.   By POD 2, she was doing well and neurologically in tact.  She was transferred to [place].    She was evaluated by OT/PT and CIR was recommended.   Cardiology was also consulted and felt that even given her age,  anticoagulation with Eliquis is appropriate at '5mg'$  bid.    By POD 3, she was neuro in tact without headaches.  Her hematoma in the left antecubital space was stable.   By POD 4, she was neuro in tact.  She continued to have an easily palpable left radial pulse.    By POD 5, she was doing well.  Eliquis is started and her aspirin is discontinued.   CT scans head: 08/24/16: IMPRESSION: Right posterior inferior cerebellar artery distribution infarct involving majority of inferior aspect the right cerebellum with swelling and narrowing of the outlet of the fourth ventricle. This will need to be monitored closely as the patient may be at risk for development of hydrocephalus. Question tiny petechial hemorrhage along the periphery of this infarct.  Interval clearing of contrast enhancement involving right subinsular and operculum region. Now noted is an acute nonhemorrhagic infarct of the right subinsular/ peri operculum region.  08/26/16: IMPRESSION: 1. Evolutionary changes of previously noted right cerebellar infarct. No evidence of developing hydrocephalus. No acute hemorrhage. 2. Probable interval edema within the right middle cerebral artery distribution including the right insular ribbon suspicious for recent foci of infarction as well. Consider MRI for further assessment. 3. Slight deviation of the septum to the left by 2 mm.   The remainder of the hospital course consisted of increasing mobilization and increasing intake of solids without difficulty.  CBC    Component Value Date/Time   WBC 10.2 08/28/2016 0306   RBC 3.03 (L) 08/28/2016 0306   HGB 9.5 (L) 08/28/2016 0306   HCT 29.3 (L) 08/28/2016 0306   PLT 195 08/28/2016 0306   MCV 96.7 08/28/2016 0306   MCH 31.4 08/28/2016 0306   MCHC 32.4 08/28/2016 0306   RDW 14.3 08/28/2016 0306   LYMPHSABS 1.3 08/28/2016 0306   MONOABS 1.2 (H) 08/28/2016 0306   EOSABS 0.5 08/28/2016 0306   BASOSABS 0.0 08/28/2016 0306     BMET    Component Value Date/Time   NA 135 08/28/2016 0306   NA 135 08/28/2016 0306   K 3.7 08/28/2016 0306   K 3.7 08/28/2016 0306   CL 103 08/28/2016 0306   CL 103 08/28/2016 0306   CO2 27 08/28/2016 0306   CO2 27 08/28/2016 0306   GLUCOSE 110 (H) 08/28/2016 0306   GLUCOSE 110 (H) 08/28/2016 0306   BUN 10 08/28/2016 0306   BUN 10 08/28/2016 0306   CREATININE 0.61 08/28/2016 0306   CREATININE 0.65 08/28/2016 0306   CALCIUM 8.0 (L) 08/28/2016 0306   CALCIUM 7.9 (L) 08/28/2016 0306   GFRNONAA >60 08/28/2016 0306   GFRNONAA >60 08/28/2016 0306   GFRAA >60 08/28/2016 0306   GFRAA >60 08/28/2016 0306      Discharge Instructions    Call MD for:  redness, tenderness, or signs of infection (pain, swelling, bleeding, redness, odor or green/yellow discharge around incision site)    Complete by:  As  directed    Call MD for:  severe or increased pain, loss or decreased feeling  in affected limb(s)    Complete by:  As directed    Call MD for:  temperature >100.5    Complete by:  As directed    Discharge wound care:    Complete by:  As directed    Shower daily with soap and water starting 08/28/16   Driving Restrictions    Complete by:  As directed    No driving for 2 weeks   Lifting restrictions    Complete by:  As directed    No lifting for 2 weeks   Resume previous diet    Complete by:  As directed       Discharge Diagnosis:  Arterial occlusion ([place]) [I74.9] Left upper arm pain [M79.622]  Secondary Diagnosis: Patient Active Problem List   Diagnosis Date Noted  . Embolism of right middle cerebral artery 08/24/2016  . Thromboembolic stroke ([place]) 41/96/2229  . Ischemia of extremity 08/23/2016  . Syncope 01/06/2016  . Atypical atrial flutter ([place]) 10/28/2015  . Essential hypertension 10/28/2015  . Aortic valve disorder 05/19/2009  . SVT/ PSVT/ PAT 05/19/2009   Past Medical History:  Diagnosis Date  . Aortic insufficiency    Mild  . Aortic stenosis     Moderate to severe  . Atypical atrial flutter ([place])   . Benign essential HTN   . Embolic stroke involving right cerebellar artery ([place])   . Hypothyroidism   . Ischemia of extremity      Allergies as of 08/28/2016   No Known Allergies     Medication List    STOP taking these medications   aspirin EC 81 MG tablet   metoprolol succinate 25 MG 24 hr tablet Commonly known as:  TOPROL XL     TAKE these medications   amiodarone 100 MG tablet Commonly known as:  PACERONE Take 0.5 tablets (50 mg total) by mouth daily.   apixaban 5 MG Tabs tablet Commonly known as:  ELIQUIS Take 1 tablet (5 mg total) by mouth 2 (two) times daily.   atorvastatin 20 MG tablet Commonly known as:  LIPITOR Take 1 tablet (20 mg total) by mouth daily at 6 PM.   cyproheptadine 4 MG tablet Commonly known as:  PERIACTIN Take 4 mg by mouth 3 (three) times daily as needed for allergies. Reported on 08/27/2015   diphenhydrAMINE 25 mg capsule Commonly known as:  BENADRYL Take 1 capsule (25 mg total) by mouth every 8 (eight) hours as needed for itching.   levothyroxine 137 MCG tablet Commonly known as:  SYNTHROID, LEVOTHROID Take 112 mcg by mouth daily before breakfast.   Lutein-Zeaxanthin 25-5 MG Caps Take 1 capsule by mouth 2 (two) times daily.   multivitamin tablet Take 1 tablet by mouth daily.   traMADol 50 MG tablet Commonly known as:  ULTRAM Take 1 tablet (50 mg total) by mouth every 8 (eight) hours as needed for moderate pain.   triamcinolone cream 0.1 % Commonly known as:  KENALOG Apply 1 application topically daily as needed (irritated skin).   triamterene-hydrochlorothiazide 37.5-25 MG capsule Commonly known as:  DYAZIDE Take 1 capsule by mouth daily.       Prescriptions given: 1.  Eliquis '5mg'$  bid 60 3RF (refills per cardiology or PCP in the future)  Instructions: 1.  No driving for 2 weeks and while taking pain medication 2.  No heavy lifting x 2 weeks 3.  Shower daily  starting 08/28/16  4.  Resume Metoprolol per cardiology.  (BP and HR marginal for restarting at discharge).    Disposition: CIR  Patient's condition: is Good  Follow up: 1. Dr. Oneida Alar in 2 weeks   Leontine Locket, PA-C [facility] [phone] 08/28/2016  10:41 AM

## 2016-08-28 NOTE — Progress Notes (Signed)
STROKE TEAM PROGRESS NOTE   HISTORY OF PRESENT ILLNESS (per record) Christine Pugh is a 83 y.o. female. transferred from outside hospital due to subacute right cerebellar infarct on CT early this am and decreased vascular perfusion of the left arm due to brachial artery emboli.  She has paroxysmal a. Fib.  She underwent uneventful embolectomy today and was last seen normal at 1 pm.  At 2 pm, RN saw patient with mental status changes, right gaze deviation, and left hemiparesis.  CT Brain  Shows no hemorrhage, but old left cerebellar infarct and subacute right cerebellar infarct and right MCA clot visible.  I see hypodensity in the right frontal and parietal areas, but radiologist does not confirm that.  CTA Brain and Neck with perfusion studies were ordered in anticipation of mechanical thrombectomy.  She is not an ideal candidate for IV tPA given recent surgery.     SUBJECTIVE (INTERVAL HISTORY) No family members present.She is sitting in bedside chair The patient is stable annd willing to take oral anticoagulants and I explained risk benefit and she voiced understanding .   OBJECTIVE Temp:  [97.8 F (36.6 C)-98.6 F (37 C)] 98.6 F (37 C) (01/26 0405) Pulse Rate:  [68-73] 73 (01/26 0405) Cardiac Rhythm: Normal sinus rhythm (01/26 0801) Resp:  [16-18] 16 (01/26 0405) BP: (109-128)/(52-91) 114/91 (01/26 0405) SpO2:  [94 %-100 %] 95 % (01/26 0950)  CBC:   Recent Labs Lab 08/27/16 0324 08/28/16 0306  WBC 12.1* 10.2  NEUTROABS 9.4* 7.3  HGB 9.9* 9.5*  HCT 30.9* 29.3*  MCV 97.5 96.7  PLT 182 0000000    Basic Metabolic Panel:   Recent Labs Lab 08/27/16 0324 08/28/16 0306  NA 138 135  135  K 4.2 3.7  3.7  CL 106 103  103  CO2 26 27  27   GLUCOSE 118* 110*  110*  BUN 11 10  10   CREATININE 0.59 0.61  0.65  CALCIUM 8.1* 8.0*  7.9*  MG 1.9 1.7  PHOS 1.7* 1.9*    Lipid Panel:     Component Value Date/Time   CHOL 164 08/25/2016 0331   TRIG 69 08/25/2016 0331   HDL 56  08/25/2016 0331   CHOLHDL 2.9 08/25/2016 0331   VLDL 14 08/25/2016 0331   LDLCALC 94 08/25/2016 0331   HgbA1c:  Lab Results  Component Value Date   HGBA1C 5.7 (H) 08/25/2016   Urine Drug Screen:     Component Value Date/Time   LABOPIA NONE DETECTED 08/24/2016 1648   COCAINSCRNUR NONE DETECTED 08/24/2016 1648   LABBENZ POSITIVE (A) 08/24/2016 1648   AMPHETMU NONE DETECTED 08/24/2016 1648   THCU NONE DETECTED 08/24/2016 1648   LABBARB NONE DETECTED 08/24/2016 1648      IMAGING  Ct Angio Head and Neck W Or Wo Contrast 08/23/2016 Atherosclerotic disease at both carotid bifurcations. No stenosis on the right. 40% stenosis of the proximal ICA on the left. Embolic occlusion of the right MCA at the M1 segment.  Ectatic ascending aorta, transverse diameter up to 4.1 cm.  Ascending aorta not completely imaged. 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  50% stenosis at both vertebral artery origins.    Ct Head Wo Contrast 08/23/2016 1. Contrast staining and mild gyral swelling involving the right insula and temporal lobe in the MCA territory.  Superimposed petechial hemorrhage also possible.  2. Subacute right cerebellar infarct.     Ct Head Wo Contrast 08/23/2016 Moderate size subacute infarct in the right  inferior cerebellum.  Generalized atrophy and chronic small vessel ischemia.      Ct Angio Up Extrem Left W &/or Wo Contast 08/23/2016 Occlusion of the distal left axillary artery spanning 5 cm with distal reconstitution of the brachial artery in the mid proximal humerus.  Brachial arterial flow to the elbow is thready with small caliber vessels.      Ct Cerebral Perfusion W Contrast [id number] Embolic occlusion of the right M1 segment. Central blood flow less than 30% volume of 67 cc. T-max greater than 6 seconds 294 cc. Mismatch volume 227 cc. Confounding factors as discussed in the last paragraph above.     Ir Percutaneous Art Thrombectomy/infusion Intracranial Inc  Diag Angio 08/23/2016 Status post cerebral angiogram and complete revascularization (TICI 3) of right MCA occlusion with 3 passes of SOLUMBRA technique, using stentriever technology and local aspiration. Deployment of Exoseal for hemostasis.     Ct Head Code Stroke Wo Contrast 08/23/2016 Re- demonstration of acute infarction at the inferior cerebellum on the right.  Question involvement of the right cerebral peduncle and thalamus.  Newly seen hyperdense right MCA consistent with embolus to that vessel.  No sign of brain infarction in the territory at this time however.  Aspects 10.   CT head wo 08/24/2016 :Right posterior inferior cerebellar artery distribution infarct involving majority of inferior aspect the right cerebellum with swelling and narrowing of the outlet of the fourth ventricle.Interval clearing of contrast enhancement involving right subinsular and operculum region. Now noted is an acute nonhemorrhagic infarct of the right subinsular/ peri operculum region.  PHYSICAL EXAM Frail elderly Caucasian lady who is intubated and not sedated. She has bilateral groin arterial sheaths. . Afebrile. Head is nontraumatic. Neck is supple without bruit.    Cardiac exam no murmur or gallop. Lungs are clear to auscultation. Distal pulses are well felt.  Neurological Exam : Intubated but not sedated. Awake alert and following commands quite well. Extraocular movements are full range without nystagmus. Decreased blink to threat on the left compared to the right. Pupils equal reactive. Fundi not visualized. No facial weakness. Tongue midline. Able to move all 4 extremities against gravity.   Plantars bilateral downgoing. Sensation appears preserved bilaterally. Gait was not tested.    ASSESSMENT/PLAN Christine Pugh is a 83 y.o. female with history of atrial fibrillation (not anticoagulated), hypertension, and aortic valvular disease presenting with right gaze deviation and left hemiparesis.  She did not receive IV t-PA due to a recent embolectomy.  Stroke:  Non-dominant  Infarct - embolic secondary to atrial fibrillation. S/p mechanical embolectomy and complete revascularization  Resultant  Mild left homonymous hemianopia  MRI - not performed  MRA - not performed  CTA H&N - Embolic occlusion of the right MCA at the M1 segment.   CT Head - acute infarction at the inferior cerebellum on the right- likely from embolectomy procedures  Carotid Doppler - CTA neck 2D Echo - Left ventricle: The cavity size was normal. There was mild   concentric hypertrophy. Systolic function was vigorous. The   estimated ejection fraction was in the range of 65% to 70%. Wall   motion was normal; there were no regional wall motion    abnormalities.  LDL -94 mg   HgbA1c -5.7  VTE prophylaxis - SCDs Diet Heart Room service appropriate? Yes; Fluid consistency: Thin  aspirin 81 mg daily prior to admission, now on aspirin 81 mg daily  Patient will be counseled to be compliant with her antithrombotic medications  Ongoing aggressive stroke risk factor management  Therapy recommendations: pending  Disposition:  Pending  Hypertension  Blood pressure tends to run low  Permissive hypertension (OK if < 220/120) but gradually normalize in 5-7 days  Long-term BP goal normotensive  Hyperlipidemia  Home meds: No blood pressure lowering medications prior to admission.  LDL 94 mg %, goal < 70 start lipitor 20 mg   Other Stroke Risk Factors  Advanced age  Atrial fibrillation not anticoagulated    Other Active Problems  Status post cerebral angiogram and complete revascularization (TICI 3) of right MCA occlusion  S/P embolectomy left upper extremity - 08/23/2016 - Dr. Oneida Alar  Mild anemia  Mild leukocytosis  Ectatic ascending aorta, transverse diameter up to 4.1 cm.  Atrial fibrillation not anticoagulated prior to admission  PLAN  DC IV Heparin   And will start eliquis  today per pharmacy  Await inpatient rehab transfer  Hospital day # 5    I have personally examined this patient, reviewed notes, independently viewed imaging studies, participated in medical decision making and plan of care.ROS completed by me personally and pertinent positives fully documented  I have made any additions or clarifications directly to the above note. Recommend mobilize out of bed. Therapy and rehab consults. Change iv heparin to eliquis today.Greater than 50 % time during this 25 minute visit was spent on counselling and coordination of care about stroke, afib and answering questions.Stroke team will sign off. F/U as outpt in stroke clinic in 6 weeks.     Antony Contras, MD Medical Director [facility] Pager: [phone] 08/28/2016 12:45 PM   08/28/2016, 12:45 PM   To contact Stroke Continuity provider, please refer to [email or web address] After hours, contact General Neurology

## 2016-08-28 NOTE — H&P (Signed)
Physical Medicine and Rehabilitation Admission H&P    Chief Complaint  Patient presents with  . LUE weakness, left visual field deficits and balance deficits.     HPI:  Christine Pugh is a 83 y.o. female with a h/o A fib/A flutter--refused anticoagulation, aortic stenosis, HTN, piror CVA who was admitted on 08/23/16 with acute onset of LUE paralysis.  CT at [facility] revealed subacute L-cerebellar stroke and CTA LUE showed occlusion of distal left axillary artery approximately 5 cm with distal reconstitution of brachial artery at mid proximal humerus. Dr. Darrick Penna consulted and patient taken to OR for  Left brachial, radial, ulnar, axillary embolectomy the same day. Post procedure patient noted to have left facial droop, rightward gaze, slurred speech and left hemiparesis with CTA revealing R-MCA embolus. She underwent thrombectomy with  revascularization of R-MCA with restoration of TICI flow.  She was extubated without difficulty on 1/22 and maintained on IV heparin. 2D echo with EF 65-70% with mild concentric LVH and vigorous systolic function.   Dr Pearlean Brownie felt stroke embolic due to A fib and patient continues on low dose ASA. Dr. Johney Frame consulted for input and recommended  Eliquis once cleared by neurology.  Patient with resultant balance deficits, poor awareness of deficits, dyspnea with activity and left visual field deficits. Therapy ongoing and CIR recommended for follow up therapy.    Review of Systems  Constitutional: Positive for malaise/fatigue. Negative for fever.  HENT: Negative for hearing loss and tinnitus.   Eyes: Negative for blurred vision and double vision.  Respiratory: Positive for cough and shortness of breath.   Cardiovascular: Positive for chest pain. Negative for leg swelling.  Gastrointestinal: Negative for abdominal pain, diarrhea, heartburn, nausea and vomiting.  Genitourinary: Negative for dysuria.  Musculoskeletal: Negative for myalgias and neck pain.  Skin: Negative  for rash.  Neurological: Positive for weakness. Negative for headaches.  Endo/Heme/Allergies: Bruises/bleeds easily.  Psychiatric/Behavioral: Negative for depression.      Past Medical History:  Diagnosis Date  . Aortic insufficiency    Mild  . Aortic stenosis    Moderate to severe  . Atypical atrial flutter (HCC)   . Benign essential HTN   . 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 Hypothyroidism   . Ischemia of extremity     Past Surgical History:  Procedure Laterality Date  . APPENDECTOMY    . BREAST LUMPECTOMY    . dental implant    . EP IMPLANTABLE DEVICE N/A 01/10/2016   Procedure: Loop Recorder Insertion;  Surgeon: Hillis Range, MD;  Location: [facility] INVASIVE CV LAB;  Service: Cardiovascular;  Laterality: N/A;  . IR GENERIC HISTORICAL  08/23/2016   IR US GUIDE VASC ACCESS RIGHT 08/23/2016 Gilmer Mor, DO [facility]  . IR GENERIC HISTORICAL  08/23/2016   IR PERCUTANEOUS ART THROMBECTOMY/INFUSION INTRACRANIAL INC DIAG ANGIO 08/23/2016 Gilmer Mor, DO [facility]  . IR GENERIC HISTORICAL  08/23/2016   IR ANGIO INTRA EXTRACRAN SEL COM CAROTID INNOMINATE UNI L MOD SED 08/23/2016 Gilmer Mor, DO [facility]  . RADIOLOGY WITH ANESTHESIA Bilateral 08/23/2016   Procedure: RADIOLOGY WITH ANESTHESIA;  Surgeon: Medication Radiologist, MD;  Location: [facility] OR;  Service: Radiology;  Laterality: Bilateral;  . THROMBECTOMY BRACHIAL ARTERY Left 08/23/2016   Procedure: Left Radial, Ulnar, Brachial and Axillary Embolectomy;  Surgeon: Sherren Kerns, MD;  Location: [facility] OR;  Service: Vascular;  Laterality: Left;  . THYROIDECTOMY    . TONSILLECTOMY    . TOTAL ABDOMINAL HYSTERECTOMY    .  VITRECTOMY AND CATARACT      Family History  Problem Relation Age of Onset  . Coronary artery disease      Social History:  reports that she has never smoked. She has never used smokeless tobacco. She reports that she does not drink alcohol or use drugs.    Allergies: No Known Allergies     Medications Prior to Admission  Medication Sig Dispense Refill  . amiodarone (PACERONE) 100 MG tablet Take 1 tablet (100 mg total) by mouth daily. (Patient taking differently: Take 50 mg by mouth daily. ) 90 tablet 3  . aspirin EC 81 MG tablet Take 81 mg by mouth daily.    . cyproheptadine (PERIACTIN) 4 MG tablet Take 4 mg by mouth 3 (three) times daily as needed for allergies. Reported on 08/27/2015    . levothyroxine (SYNTHROID, LEVOTHROID) 137 MCG tablet Take 112 mcg by mouth daily before breakfast.     . Lutein-Zeaxanthin 25-5 MG CAPS Take 1 capsule by mouth 2 (two) times daily.    . metoprolol succinate (TOPROL XL) 25 MG 24 hr tablet Take 1 tablet (25 mg total) by mouth 2 (two) times daily. 180 tablet 3  . Multiple Vitamin (MULTIVITAMIN) tablet Take 1 tablet by mouth daily.      Marland Kitchen triamcinolone cream (KENALOG) 0.1 % Apply 1 application topically daily as needed (irritated skin).     Marland Kitchen triamterene-hydrochlorothiazide (DYAZIDE) 37.5-25 MG per capsule Take 1 capsule by mouth daily.        Home: Home Living Family/patient expects to be discharged to:: Private residence Living Arrangements: Alone Available Help at Discharge: Family, Available PRN/intermittently Type of Home: Apartment Home Access: Level entry Olivia: One level Bathroom Shower/Tub: Chiropodist: Tornado: Tub bench, Environmental consultant - 2 wheels, Cane - single point, Toilet riser   Functional History: Prior Function Level of Independence: Independent with assistive device(s) Comments: Uses SPC for ambulation; has cleaning lady. Drives. Per chart, pt with h/o falls, but she denies this during eval   Functional Status:  Mobility: Bed Mobility Overal bed mobility: Needs Assistance Bed Mobility: Supine to Sit, Sit to Supine Supine to sit: Min assist Sit to supine: Min assist General bed mobility comments: pt up in chair Transfers Overall transfer level: Needs assistance Equipment  used: None Transfers: Sit to/from Stand, Stand Pivot Transfers Sit to Stand: Min assist Stand pivot transfers: Min assist General transfer comment: chair to [facility] back to chair Ambulation/Gait Ambulation/Gait assistance: Min assist, Mod assist Ambulation Distance (Feet): 20 Feet (x2) Assistive device: Rolling walker (2 wheeled) Gait Pattern/deviations: Step-through pattern, Decreased stride length, Shuffle General Gait Details: max v/c's for safe walker management, minimal step height, SPO2>91% on RA. pt fatigues quickly, walker adjusted for optimal height however pt c/o UEs hurting Gait velocity: decreased Gait velocity interpretation: <1.8 ft/sec, indicative of risk for recurrent falls    ADL: ADL Overall ADL's : Needs assistance/impaired Eating/Feeding: Set up, Bed level Grooming: Wash/dry hands, Wash/dry face, Oral care, Sitting, Set up Grooming Details (indicate cue type and reason): cleaned dentures Upper Body Bathing: Set up, Supervision/ safety, Sitting Lower Body Bathing: Maximal assistance, Sit to/from stand Upper Body Dressing : Minimal assistance, Sitting Lower Body Dressing: Total assistance, Sit to/from stand Toilet Transfer: Minimal assistance, Stand-pivot, [facility] Toilet Transfer Details (indicate cue type and reason): requires assist to move into standing and assist for balance.  Toileting- Clothing Manipulation and Hygiene: Minimal assistance, Sit to/from stand Toileting - Clothing Manipulation Details (indicate cue type and  reason): managed her pants with min assist to steady Functional mobility during ADLs: Moderate assistance, +2 for physical assistance General ADL Comments: Pt demonstrating ability to see Ipad screen and locate all visual targets.  Cognition: Cognition Overall Cognitive Status: Impaired/Different from baseline Orientation Level: Oriented X4 Cognition Arousal/Alertness: Awake/alert Behavior During Therapy: WFL for tasks assessed/performed Overall  Cognitive Status: Impaired/Different from baseline Area of Impairment: Attention, Problem solving, Memory, Safety/judgement Current Attention Level: Selective Following Commands: Follows multi-step commands with increased time Safety/Judgement: Decreased awareness of safety Problem Solving: Difficulty sequencing, Requires verbal cues, Requires tactile cues General Comments: Pt with difficulty attending to task due to concern with her electronics being where she could reach them and plugged in.   Blood pressure (!) 114/91, pulse 73, temperature 98.6 F (37 C), temperature source Oral, resp. rate 16, height '5\' 3"'$  (1.6 m), weight 65.1 kg (143 lb 8.3 oz), SpO2 94 %. Physical Exam  Constitutional: She is oriented to person, place, and time.  Frail appearing female sitting in chair  HENT:  Head: Normocephalic and atraumatic.  Right Ear: External ear normal.  Left Ear: External ear normal.  Eyes: Conjunctivae and EOM are normal. Pupils are equal, round, and reactive to light.  Neck: Normal range of motion. No JVD present. No tracheal deviation present. No thyromegaly present.  Cardiovascular: Normal rate, regular rhythm and intact distal pulses.  Exam reveals no friction rub.   No murmur heard. Respiratory: No respiratory distress. She has no wheezes.  [place] with 2L oxygen. Occasionally dyspneic with conversation  GI: She exhibits no distension. There is no tenderness. There is no rebound.  Musculoskeletal:  1+ to 2+ edema LUE with associated bruising/echymoses. LUE warm with only mild tenderness  Neurological: She is alert and oriented to person, place, and time. A cranial nerve deficit is present.  Left Homonymous hemianopsia with left inattention as well. UE motor grossly   4/5 prox in RUE,. LUE 3+ to 4- deltoid, biceps, triceps, 4/5 wrist/HI. RLE: 3+ HF, KE and 4+/5 ADF/PF. LLE: 3/5 HF, 3KE and 4/5 ADF/PF Mild limb ataxia RUE and RLE. No sensory deficits.  Fair insight and awareness. STM  deficits  Skin: Skin is warm.  Numerous bruises on all 4 limbs, LUE most affected. Chronic vascular changes noted especially in the LE's.   Psychiatric: She has a normal mood and affect. Her behavior is normal.    Results for orders placed or performed during the hospital encounter of 08/23/16 (from the past 48 hour(s))  Heparin level (unfractionated)     Status: None   Collection Time: 08/26/16  1:03 PM  Result Value Ref Range   Heparin Unfractionated 0.40 0.30 - 0.70 IU/mL    Comment:        IF HEPARIN RESULTS ARE BELOW EXPECTED VALUES, AND PATIENT DOSAGE HAS BEEN CONFIRMED, SUGGEST FOLLOW UP TESTING OF ANTITHROMBIN III LEVELS.   Heparin level (unfractionated)     Status: None   Collection Time: 08/26/16  6:32 PM  Result Value Ref Range   Heparin Unfractionated 0.56 0.30 - 0.70 IU/mL    Comment:        IF HEPARIN RESULTS ARE BELOW EXPECTED VALUES, AND PATIENT DOSAGE HAS BEEN CONFIRMED, SUGGEST FOLLOW UP TESTING OF ANTITHROMBIN III LEVELS.   Renal function panel     Status: Abnormal   Collection Time: 08/27/16  3:24 AM  Result Value Ref Range   Sodium 138 135 - 145 mmol/L   Potassium 4.2 3.5 - 5.1 mmol/L    Comment:  DELTA CHECK NOTED NO VISIBLE HEMOLYSIS    Chloride 106 101 - 111 mmol/L   CO2 26 22 - 32 mmol/L   Glucose, Bld 118 (H) 65 - 99 mg/dL   BUN 11 6 - 20 mg/dL   Creatinine, Ser 7.66 0.44 - 1.00 mg/dL   Calcium 8.1 (L) 8.9 - 10.3 mg/dL   Phosphorus 1.7 (L) 2.5 - 4.6 mg/dL   Albumin 2.3 (L) 3.5 - 5.0 g/dL   GFR calc non Af Amer >60 >60 mL/min   GFR calc Af Amer >60 >60 mL/min    Comment: (NOTE) The eGFR has been calculated using the CKD EPI equation. This calculation has not been validated in all clinical situations. eGFR's persistently <60 mL/min signify possible Chronic Kidney Disease.    Anion gap 6 5 - 15  CBC with Differential/Platelet     Status: Abnormal   Collection Time: 08/27/16  3:24 AM  Result Value Ref Range   WBC 12.1 (H) 4.0 - 10.5 K/uL     RBC 3.17 (L) 3.87 - 5.11 MIL/uL   Hemoglobin 9.9 (L) 12.0 - 15.0 g/dL   HCT 91.4 (L) 26.0 - 72.3 %   MCV 97.5 78.0 - 100.0 fL   MCH 31.2 26.0 - 34.0 pg   MCHC 32.0 30.0 - 36.0 g/dL   RDW 00.7 29.3 - 04.8 %   Platelets 182 150 - 400 K/uL   Neutrophils Relative % 78 %   Neutro Abs 9.4 (H) 1.7 - 7.7 K/uL   Lymphocytes Relative 9 %   Lymphs Abs 1.1 0.7 - 4.0 K/uL   Monocytes Relative 9 %   Monocytes Absolute 1.0 0.1 - 1.0 K/uL   Eosinophils Relative 4 %   Eosinophils Absolute 0.5 0.0 - 0.7 K/uL   Basophils Relative 0 %   Basophils Absolute 0.0 0.0 - 0.1 K/uL  Magnesium     Status: None   Collection Time: 08/27/16  3:24 AM  Result Value Ref Range   Magnesium 1.9 1.7 - 2.4 mg/dL  Heparin level (unfractionated)     Status: None   Collection Time: 08/27/16  3:24 AM  Result Value Ref Range   Heparin Unfractionated 0.32 0.30 - 0.70 IU/mL    Comment:        IF HEPARIN RESULTS ARE BELOW EXPECTED VALUES, AND PATIENT DOSAGE HAS BEEN CONFIRMED, SUGGEST FOLLOW UP TESTING OF ANTITHROMBIN III LEVELS.   Renal function panel     Status: Abnormal   Collection Time: 08/28/16  3:06 AM  Result Value Ref Range   Sodium 135 135 - 145 mmol/L   Potassium 3.7 3.5 - 5.1 mmol/L   Chloride 103 101 - 111 mmol/L   CO2 27 22 - 32 mmol/L   Glucose, Bld 110 (H) 65 - 99 mg/dL   BUN 10 6 - 20 mg/dL   Creatinine, Ser 0.13 0.44 - 1.00 mg/dL   Calcium 8.0 (L) 8.9 - 10.3 mg/dL   Phosphorus 1.9 (L) 2.5 - 4.6 mg/dL   Albumin 2.1 (L) 3.5 - 5.0 g/dL   GFR calc non Af Amer >60 >60 mL/min   GFR calc Af Amer >60 >60 mL/min    Comment: (NOTE) The eGFR has been calculated using the CKD EPI equation. This calculation has not been validated in all clinical situations. eGFR's persistently <60 mL/min signify possible Chronic Kidney Disease.    Anion gap 5 5 - 15  Magnesium     Status: None   Collection Time: 08/28/16  3:06 AM  Result  Value Ref Range   Magnesium 1.7 1.7 - 2.4 mg/dL  CBC with  Differential/Platelet     Status: Abnormal   Collection Time: 08/28/16  3:06 AM  Result Value Ref Range   WBC 10.2 4.0 - 10.5 K/uL   RBC 3.03 (L) 3.87 - 5.11 MIL/uL   Hemoglobin 9.5 (L) 12.0 - 15.0 g/dL   HCT 29.3 (L) 36.0 - 46.0 %   MCV 96.7 78.0 - 100.0 fL   MCH 31.4 26.0 - 34.0 pg   MCHC 32.4 30.0 - 36.0 g/dL   RDW 14.3 11.5 - 15.5 %   Platelets 195 150 - 400 K/uL   Neutrophils Relative % 71 %   Neutro Abs 7.3 1.7 - 7.7 K/uL   Lymphocytes Relative 13 %   Lymphs Abs 1.3 0.7 - 4.0 K/uL   Monocytes Relative 11 %   Monocytes Absolute 1.2 (H) 0.1 - 1.0 K/uL   Eosinophils Relative 5 %   Eosinophils Absolute 0.5 0.0 - 0.7 K/uL   Basophils Relative 0 %   Basophils Absolute 0.0 0.0 - 0.1 K/uL  Basic metabolic panel     Status: Abnormal   Collection Time: 08/28/16  3:06 AM  Result Value Ref Range   Sodium 135 135 - 145 mmol/L   Potassium 3.7 3.5 - 5.1 mmol/L   Chloride 103 101 - 111 mmol/L   CO2 27 22 - 32 mmol/L   Glucose, Bld 110 (H) 65 - 99 mg/dL   BUN 10 6 - 20 mg/dL   Creatinine, Ser 0.65 0.44 - 1.00 mg/dL   Calcium 7.9 (L) 8.9 - 10.3 mg/dL   GFR calc non Af Amer >60 >60 mL/min   GFR calc Af Amer >60 >60 mL/min    Comment: (NOTE) The eGFR has been calculated using the CKD EPI equation. This calculation has not been validated in all clinical situations. eGFR's persistently <60 mL/min signify possible Chronic Kidney Disease.    Anion gap 5 5 - 15  Heparin level (unfractionated)     Status: Abnormal   Collection Time: 08/28/16  3:06 AM  Result Value Ref Range   Heparin Unfractionated 0.20 (L) 0.30 - 0.70 IU/mL    Comment:        IF HEPARIN RESULTS ARE BELOW EXPECTED VALUES, AND PATIENT DOSAGE HAS BEEN CONFIRMED, SUGGEST FOLLOW UP TESTING OF ANTITHROMBIN III LEVELS.    No results found.     Medical Problem List and Plan: 1.  Functional and mobility deficits secondary to embolic infarcts involving right MCA and inferior right cerebellum  -admit to inpatient  rehab   2.  DVT Prophylaxis/Anticoagulation: Mechanical: Sequential compression devices, below knee Bilateral lower extremities, eliquis 3. Pain Management: tylenol for mild pain and tramadol for more severe pain 4. Mood: team to provide ego supports  -monitor while here. Pt seems to be in reasonable spirits  -SW to screen patient 5. Neuropsych: This patient is capable of making decisions on her own behalf. 6. Skin/Wound Care: loca care to wounds, elevate LUE for edema control  -encourage adequate nutrition 7. Fluids/Electrolytes/Nutrition: Encourage PO  -Labs during admission to follow up fluids/elecrolytes 8. A Fib: rate controlled at present. Monitor as we begin mobilizing patient more.  -amiodarone for rate control  -eliquis for anticoagulation  -wean oxygen as able 9. HTN:  Monitor bp's with increasing mobility. Diastolic elevated today  -continue dyazide daily- 10. LUE embolectomy: limb warm, still edematous  -continue to elevate  -ACE wrap if needed   -eliquis  Post Admission Physician Evaluation: 1. Functional deficits secondary  to embolic right MCA and cerebellar infarcts. 2. Patient is admitted to receive collaborative, interdisciplinary care between the physiatrist, rehab nursing staff, and therapy team. 3. Patient's level of medical complexity and substantial therapy needs in context of that medical necessity cannot be provided at a lesser intensity of care such as a SNF. 4. Patient has experienced substantial functional loss from his/her baseline which was documented above under the "Functional History" and "Functional Status" headings.  Judging by the patient's diagnosis, physical exam, and functional history, the patient has potential for functional progress which will result in measurable gains while on inpatient rehab.  These gains will be of substantial and practical use upon discharge  in facilitating mobility and self-care at the household level. 5. Physiatrist  will provide 24 hour management of medical needs as well as oversight of the therapy plan/treatment and provide guidance as appropriate regarding the interaction of the two. 6. The Preadmission Screening has been reviewed and patient status is unchanged unless otherwise stated above. 7. 24 hour rehab nursing will assist with bladder management, bowel management, safety, skin/wound care, disease management, medication administration, pain management and patient education  and help integrate therapy concepts, techniques,education, etc. 8. PT will assess and treat for/with: Lower extremity strength, range of motion, stamina, balance, functional mobility, safety, adaptive techniques and equipment, edema mgt, activity tolerance, NMR, visual-spatial awareness, family education.   Goals are: supervision. 9. OT will assess and treat for/with: ADL's, functional mobility, safety, upper extremity strength, adaptive techniques and equipment, NMR, visual-spatial awareness, edema mgt LUE, family education.   Goals are: supervision . Therapy may proceed with showering this patient. 10. SLP will assess and treat for/with: cognition, communication, family education.  Goals are: supervision. 11. Case Management and Social Worker will assess and treat for psychological issues and discharge planning. 12. Team conference will be held weekly to assess progress toward goals and to determine barriers to discharge. 13. Patient will receive at least 3 hours of therapy per day at least 5 days per week. 14. ELOS: 13-17 days       15. Prognosis:  excellent     Meredith Staggers, MD, [place] 08/28/2016  Bary Leriche, Hershal Coria 08/28/2016

## 2016-08-28 NOTE — Telephone Encounter (Signed)
-----   Message from Mena Goes, RN sent at 08/28/2016  9:49 AM EST ----- Regarding: 2-3 weeks   ----- Message ----- From: Gabriel Earing, PA-C Sent: 08/28/2016   9:43 AM To: [facility] Charge Pool  S/p thrombectomy left arm.  F/u with Dr. Oneida Alar in 2-3 weeks.  Thanks, Aldona Bar
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
## 2016-08-29 ENCOUNTER — Inpatient Hospital Stay (HOSPITAL_COMMUNITY): Payer: Medicare Other | Admitting: Physical Therapy

## 2016-08-29 ENCOUNTER — Inpatient Hospital Stay (HOSPITAL_COMMUNITY): Payer: Medicare Other | Admitting: Speech Pathology

## 2016-08-29 ENCOUNTER — Inpatient Hospital Stay (HOSPITAL_COMMUNITY): Payer: Medicare Other

## 2016-08-29 DIAGNOSIS — I63411 Cerebral infarction due to embolism of right middle cerebral artery: Secondary | ICD-10-CM

## 2016-08-29 DIAGNOSIS — I10 Essential (primary) hypertension: Secondary | ICD-10-CM

## 2016-08-29 DIAGNOSIS — I484 Atypical atrial flutter: Secondary | ICD-10-CM

## 2016-08-29 LAB — CBC WITH DIFFERENTIAL/PLATELET
BASOS ABS: 0 10*3/uL (ref 0.0–0.1)
BASOS PCT: 0 %
EOS ABS: 0.3 10*3/uL (ref 0.0–0.7)
EOS PCT: 3 %
HEMATOCRIT: 29.8 % — AB (ref 36.0–46.0)
Hemoglobin: 9.8 g/dL — ABNORMAL LOW (ref 12.0–15.0)
Lymphocytes Relative: 11 %
Lymphs Abs: 1.1 10*3/uL (ref 0.7–4.0)
MCH: 31.5 pg (ref 26.0–34.0)
MCHC: 32.9 g/dL (ref 30.0–36.0)
MCV: 95.8 fL (ref 78.0–100.0)
MONO ABS: 1.3 10*3/uL — AB (ref 0.1–1.0)
Monocytes Relative: 13 %
NEUTROS ABS: 6.9 10*3/uL (ref 1.7–7.7)
Neutrophils Relative %: 73 %
PLATELETS: 235 10*3/uL (ref 150–400)
RBC: 3.11 MIL/uL — ABNORMAL LOW (ref 3.87–5.11)
RDW: 14.1 % (ref 11.5–15.5)
WBC: 9.6 10*3/uL (ref 4.0–10.5)

## 2016-08-29 LAB — COMPREHENSIVE METABOLIC PANEL
ALBUMIN: 2.1 g/dL — AB (ref 3.5–5.0)
ALT: 22 U/L (ref 14–54)
ANION GAP: 8 (ref 5–15)
AST: 39 U/L (ref 15–41)
Alkaline Phosphatase: 85 U/L (ref 38–126)
BUN: 8 mg/dL (ref 6–20)
CHLORIDE: 100 mmol/L — AB (ref 101–111)
CO2: 27 mmol/L (ref 22–32)
Calcium: 7.9 mg/dL — ABNORMAL LOW (ref 8.9–10.3)
Creatinine, Ser: 0.64 mg/dL (ref 0.44–1.00)
GFR calc Af Amer: >60 mL/min (ref >60)
GFR calc non Af Amer: >60 mL/min (ref >60)
GLUCOSE: 131 mg/dL — AB (ref 65–99)
POTASSIUM: 3.9 mmol/L (ref 3.5–5.1)
SODIUM: 135 mmol/L (ref 135–145)
Total Bilirubin: 0.8 mg/dL (ref 0.3–1.2)
Total Protein: 4.9 g/dL — ABNORMAL LOW (ref 6.5–8.1)

## 2016-08-29 LAB — MAGNESIUM: Magnesium: 1.6 mg/dL — ABNORMAL LOW (ref 1.7–2.4)

## 2016-08-29 MED ORDER — IPRATROPIUM-ALBUTEROL 0.5-2.5 (3) MG/3ML IN SOLN: 3.0000 mL | Freq: Four times a day (QID) | RESPIRATORY_TRACT | Status: DC | PRN

## 2016-08-29 NOTE — Evaluation (Signed)
Occupational Therapy Assessment and Plan  Patient Details  Name: Christine Pugh MRN: [id number] Date of Birth: 03-02-1922  OT Diagnosis: abnormal posture and muscle weakness (generalized) Rehab Potential: Rehab Potential (ACUTE ONLY): Good ELOS: 10-14 days   Today's Date: 08/29/2016 OT Individual Time: 3545-6256 OT Individual Time Calculation (min): 75 min     Problem List:  Patient Active Problem List   Diagnosis Date Noted  . Embolic stroke involving middle cerebral artery ([place]) 08/28/2016  . Embolism of right middle cerebral artery 08/24/2016  . Thromboembolic stroke ([place]) 38/93/7342  . Ischemia of extremity 08/23/2016  . Syncope 01/06/2016  . Atypical atrial flutter ([place]) 10/28/2015  . Essential hypertension 10/28/2015  . Aortic valve disorder 05/19/2009  . SVT/ PSVT/ PAT 05/19/2009    Past Medical History:  Past Medical History:  Diagnosis Date  . Aortic insufficiency    Mild  . Aortic stenosis    Moderate to severe  . Atypical atrial flutter ([place])   . Benign essential HTN   . Embolic stroke involving right cerebellar artery ([place])   . Hypothyroidism   . Ischemia of extremity    Past Surgical History:  Past Surgical History:  Procedure Laterality Date  . APPENDECTOMY    . BREAST LUMPECTOMY    . dental implant    . EP IMPLANTABLE DEVICE N/A 01/10/2016   Procedure: Loop Recorder Insertion;  Surgeon: [name], MD;  Location: [facility];  Service: Cardiovascular;  Laterality: N/A;  . IR GENERIC HISTORICAL  08/23/2016   IR US GUIDE VASC ACCESS RIGHT 08/23/2016 Corrie Mckusick, DO [facility]  . IR GENERIC HISTORICAL  08/23/2016   IR PERCUTANEOUS ART THROMBECTOMY/INFUSION INTRACRANIAL INC DIAG ANGIO 08/23/2016 Corrie Mckusick, DO [facility]  . IR GENERIC HISTORICAL  08/23/2016   IR ANGIO INTRA EXTRACRAN SEL COM CAROTID INNOMINATE UNI L MOD SED 08/23/2016 Corrie Mckusick, DO [facility]  . RADIOLOGY WITH ANESTHESIA Bilateral 08/23/2016   Procedure: RADIOLOGY WITH  ANESTHESIA;  Surgeon: Medication Radiologist, MD;  Location: [facility];  Service: Radiology;  Laterality: Bilateral;  . THROMBECTOMY BRACHIAL ARTERY Left 08/23/2016   Procedure: Left Radial, Ulnar, Brachial and Axillary Embolectomy;  Surgeon: Elam Dutch, MD;  Location: [facility];  Service: Vascular;  Laterality: Left;  . THYROIDECTOMY    . TONSILLECTOMY    . TOTAL ABDOMINAL HYSTERECTOMY    . VITRECTOMY AND CATARACT      Assessment & Plan Clinical Impression: Christine Muldrow McIntyreis a 83 y.o.femalewith a h/o A fib/A flutter--refused anticoagulation, aortic stenosis, HTN, piror CVA who was admitted on 08/23/16 with acute onset of LUE paralysis. CT at [facility] revealed subacute L-cerebellar stroke and CTA LUE showed occlusion of distal left axillary artery approximately 5 cm with distal reconstitution of brachial artery at mid proximal humerus. Dr. Oneida Alar consulted and patient taken to OR for Left brachial, radial, ulnar, axillary embolectomy the same day. Post procedure patient noted to have left facial droop, rightward gaze, slurred speech and left hemiparesis with CTA revealing R-MCA embolus. She underwent thrombectomy with revascularization of R-MCA with restoration of TICI flow. She was extubated without difficulty on 1/22 and maintained on IV heparin. 2D echo with EF 65-70% with mild concentric LVH and vigorous systolic function. Dr Leonie Man felt stroke embolic due to A fib and patient continues on low dose ASA. Dr. Rayann Heman consulted for input and recommended Eliquis once cleared by neurology. Patient with resultant balance deficits, poor awareness of deficits, dyspnea with activity and left visual field deficits. Therapy ongoing and  CIR recommended for follow up therapy.   Patient currently requires min with basic self-care skills secondary to muscle weakness, decreased cardiorespiratoy endurance, field cut and decreased standing balance, decreased postural control and decreased balance strategies.  Prior  to hospitalization, patient could complete BADLs with modified independent .  Patient will benefit from skilled intervention to increase independence with basic self-care skills prior to discharge home with care partner.  Anticipate patient will require 24 hour supervision and follow up home health.  OT - End of Session Activity Tolerance: Decreased this session Endurance Deficit: Yes Endurance Deficit Description: frequent rest breaks; monitor SpO2 OT Assessment Rehab Potential (ACUTE ONLY): Good OT Patient demonstrates impairments in the following area(s): Balance;Cognition;Vision;Endurance;Motor;Safety OT Basic ADL's Functional Problem(s): Grooming;Bathing;Dressing;Toileting OT Transfers Functional Problem(s): Toilet;Tub/Shower OT Additional Impairment(s): None OT Plan OT Intensity: Minimum of 1-2 x/day, 45 to 90 minutes OT Frequency: 5 out of 7 days OT Duration/Estimated Length of Stay: 10-14 days OT Treatment/Interventions: Balance/vestibular training;Cognitive remediation/compensation;Community reintegration;Discharge planning;Functional mobility training;Neuromuscular re-education;Pain management;DME/adaptive equipment instruction;Patient/family education;Therapeutic Activities;Self Care/advanced ADL retraining;Splinting/orthotics;UE/LE Strength taining/ROM;Therapeutic Exercise;Wheelchair propulsion/positioning;UE/LE Coordination activities;Visual/perceptual remediation/compensation OT Self Feeding Anticipated Outcome(s): Set up OT Basic Self-Care Anticipated Outcome(s): Supervision OT Toileting Anticipated Outcome(s): Supervision OT Bathroom Transfers Anticipated Outcome(s): Supervision OT Recommendation Patient destination: Home Follow Up Recommendations: Home health OT Equipment Recommended: None recommended by OT Equipment Details: pt has TTB and elevated toilet seat   Skilled Therapeutic Intervention OT evaluation completed. Discussed role of therapy, possible ELOS, safety  plan, DME, and goals. OT also began discharge planning with pt reporting she will have a friend (retried Therapist, sports) to stay with her 24/7. Pt completed bathing and dressing from w/c at sink requiring min A for standing balance. She was on 2L O2 throughout session. Pt required frequent rest breaks with SpO2 dropping to 88-91% following stands and UB dressing. At end of session pt left sitting in w/c with all needs in reach.  OT Evaluation Precautions/Restrictions  Precautions Precautions: Fall Precaution Comments: watch sats Restrictions Weight Bearing Restrictions: No General   Vital Signs  Pain Pain Assessment Pain Assessment: No/denies pain Pain Score: 0-No pain Home Living/Prior Functioning Home Living Available Help at Discharge: Family, Available PRN/intermittently, Friend(s) Type of Home: Apartment Home Access: Level entry Home Layout: One level Bathroom Shower/Tub: [employer] social research officer Accessibility: Yes Additional Comments: Pt reports a friend will stay with her 24/7  Lives With: Alone Prior Function Level of Independence: Requires assistive device for independence, Independent with basic ADLs  Able to Take Stairs?: Yes Driving: Yes Vocation: Retired Comments: Uses SPC for ambulation; has cleaning lady. Drives. Per chart, pt with h/o falls, but she denies this during eval  ADL   Vision/Perception  Vision- History Baseline Vision/History: No visual deficits Patient Visual Report: Peripheral vision impairment Vision- Assessment Vision Assessment?: Vision impaired- to be further tested in functional context Additional Comments: Left field cut; pt able to compensate  Cognition Overall Cognitive Status: Within Functional Limits for tasks assessed Arousal/Alertness: Awake/alert Orientation Level: Person;Place;Situation Person: Oriented Place: Oriented Situation: Oriented Year: 2018 Month: January Day of Week: Correct Memory:  Impaired Memory Impairment: Decreased recall of new information (mild impairments) Immediate Memory Recall: Blue;Sock;Bed Memory Recall: Sock;Blue;Bed Memory Recall Sock: Without Cue Memory Recall Blue: Without Cue Memory Recall Bed: Without Cue Attention: Sustained Sustained Attention: Appears intact Awareness: Appears intact Problem Solving: Appears intact Safety/Judgment: Appears intact Sensation Sensation Light Touch: Appears Intact Proprioception: Appears Intact Coordination Gross Motor Movements are Fluid and Coordinated: Yes Fine Motor Movements are  Fluid and Coordinated: Yes Finger Nose Finger Test: [facility]. delayed speed bilaterally Motor  Motor Motor: Other (comment) Motor - Skilled Clinical Observations: Generalized weakness Mobility  Transfers Transfers: Sit to Stand;Stand to Sit Sit to Stand: 4: Min assist Stand to Sit: 4: Min assist  Trunk/Postural Assessment  Cervical Assessment Cervical Assessment: Exceptions to [facility] (forward head) Thoracic Assessment Thoracic Assessment: Exceptions to [facility] (rounded shoulders) Lumbar Assessment Lumbar Assessment: Exceptions to [facility] (Posterior pelvic tilt) Postural Control Postural Control: Within Functional Limits  Balance Balance Balance Assessed: Yes Static Sitting Balance Static Sitting - Level of Assistance: 6: Modified independent (Device/Increase time) Dynamic Sitting Balance Dynamic Sitting - Level of Assistance: 5: Stand by assistance Static Standing Balance Static Standing - Balance Support: No upper extremity supported Static Standing - Level of Assistance: 4: Min assist Dynamic Standing Balance Dynamic Standing - Balance Support: No upper extremity supported Dynamic Standing - Level of Assistance: 4: Min assist Dynamic Standing - Balance Activities: Forward lean/weight shifting Extremity/Trunk Assessment RUE Assessment RUE Assessment: Within Functional Limits (4/5) LUE Assessment LUE Assessment: Within  Functional Limits (4/5 overall)   See Function Navigator for Current Functional Status.   Refer to Care Plan for Long Term Goals  Recommendations for other services: None    Discharge Criteria: Patient will be discharged from OT if patient refuses treatment 3 consecutive times without medical reason, if treatment goals not met, if there is a change in medical status, if patient makes no progress towards goals or if patient is discharged from hospital.  The above assessment, treatment plan, treatment alternatives and goals were discussed and mutually agreed upon: by patient  Duayne Cal 08/29/2016, 11:17 AM

## 2016-08-29 NOTE — Progress Notes (Addendum)
Christine Pugh is a 83 y.o. female 1922/04/22 [id number]  Subjective: No new complaints. No new problems. Slept well. Feeling OK.  Objective: Vital signs in last 24 hours: Temp:  [98.1 F (36.7 C)-98.5 F (36.9 C)] 98.5 F (36.9 C) (01/27 1420) Pulse Rate:  [64-71] 64 (01/27 1420) Resp:  [18] 18 (01/27 1420) BP: (122-127)/(41-75) 122/55 (01/27 1420) SpO2:  [81 %-99 %] 99 % (01/27 1420) Weight:  [168 lb (76.2 kg)] 168 lb (76.2 kg) (01/26 1517) Weight change:  Last BM Date: 08/29/16  Intake/Output from previous day: 01/26 0701 - 01/27 0700 In: 560 [P.O.:560] Out: 300 [Urine:300] Last cbgs: CBG (last 3)  No results for input(s): GLUCAP in the last 72 hours.   Physical Exam General: No apparent distress   HEENT: not dry Lungs: Normal effort. Lungs w/decr BS at bases. On O2 Cardiovascular: irreg rhythm, no edema Abdomen: S/NT/ND; BS(+) Musculoskeletal:  unchanged Neurological: No new neurological deficits Wounds: N/A    Skin: clear  Aging changes Mental state: Alert, oriented, cooperative    Lab Results: BMET    Component Value Date/Time   NA 135 08/29/2016 0516   K 3.9 08/29/2016 0516   CL 100 (L) 08/29/2016 0516   CO2 27 08/29/2016 0516   GLUCOSE 131 (H) 08/29/2016 0516   BUN 8 08/29/2016 0516   CREATININE 0.64 08/29/2016 0516   CALCIUM 7.9 (L) 08/29/2016 0516   GFRNONAA >60 08/29/2016 0516   GFRAA >60 08/29/2016 0516   CBC    Component Value Date/Time   WBC 9.6 08/29/2016 0516   RBC 3.11 (L) 08/29/2016 0516   HGB 9.8 (L) 08/29/2016 0516   HCT 29.8 (L) 08/29/2016 0516   PLT 235 08/29/2016 0516   MCV 95.8 08/29/2016 0516   MCH 31.5 08/29/2016 0516   MCHC 32.9 08/29/2016 0516   RDW 14.1 08/29/2016 0516   LYMPHSABS 1.1 08/29/2016 0516   MONOABS 1.3 (H) 08/29/2016 0516   EOSABS 0.3 08/29/2016 0516   BASOSABS 0.0 08/29/2016 0516    Studies/Results: Dg Chest 2 View  Result Date: 08/28/2016 CLINICAL DATA:  Shortness of Breath EXAM: CHEST  2 VIEW  COMPARISON:  08/11/2015. FINDINGS: Cardiomegaly is noted. Mild interstitial prominence bilateral upper lobes suspicious for mild interstitial edema. There is superimposed airspace disease in right upper lobe suspicious for infiltrate/pneumonia. Small bilateral pleural effusion with bilateral basilar atelectasis or infiltrate best seen on lateral view. Osteopenia and degenerative changes thoracic spine. IMPRESSION: Mild interstitial prominence bilateral upper lobes suspicious for mild interstitial edema. There is superimposed airspace disease in right upper lobe suspicious for infiltrate/pneumonia. Small bilateral pleural effusion with bilateral basilar atelectasis or infiltrate best seen on lateral view. Electronically Signed   By: Lahoma Crocker M.D.   On: 08/28/2016 17:15    Medications: I have reviewed the patient's current medications.  Assessment/Plan: 1.  R MCA CVA - in Rehab            2.  DVT proph - Eliquis 3. Pain Management: tylenol for mild pain and tramadol for more severe pain 4. Mood: team to provide ego supports             -monitor while here. Pt seems to be in reasonable spirits             -SW to screen patient 5. Neuropsych: This patient is capable of making decisions on her own behalf. 6. Skin/Wound Care: loca care to wounds, elevate LUE for edema control             -  encourage adequate nutrition 7. Fluids/Electrolytes/Nutrition: Encourage PO             -Labs during admission to follow up fluids/elecrolytes 8.A fib: Eliquis, Amiodarone 9. HTN - on Dyazide 10. LUE embolectomy: limb warm, still edematous    Length of stay, days: 1  Walker Kehr , MD 08/29/2016, 3:10 PM

## 2016-08-29 NOTE — Evaluation (Signed)
Physical Therapy Assessment and Plan  Patient Details  Name: Christine Pugh MRN: [id number] Date of Birth: 22-Jun-1922  PT Diagnosis: Abnormality of gait, Difficulty walking and Muscle spasms Rehab Potential: Excellent  ELOS: 10-14 days    Today's Date: 08/29/2016 PT Individual Time: 1005-1102 PT Individual Time Calculation (min): 57 min    Problem List:  Patient Active Problem List   Diagnosis Date Noted  . Embolic stroke involving middle cerebral artery ([place]) 08/28/2016  . Embolism of right middle cerebral artery 08/24/2016  . Thromboembolic stroke ([place]) 23/30/0762  . Ischemia of extremity 08/23/2016  . Syncope 01/06/2016  . Atypical atrial flutter ([place]) 10/28/2015  . Essential hypertension 10/28/2015  . Aortic valve disorder 05/19/2009  . SVT/ PSVT/ PAT 05/19/2009    Past Medical History:  Past Medical History:  Diagnosis Date  . Aortic insufficiency    Mild  . Aortic stenosis    Moderate to severe  . Atypical atrial flutter ([place])   . Benign essential HTN   . Embolic stroke involving right cerebellar artery ([place])   . Hypothyroidism   . Ischemia of extremity    Past Surgical History:  Past Surgical History:  Procedure Laterality Date  . APPENDECTOMY    . BREAST LUMPECTOMY    . dental implant    . EP IMPLANTABLE DEVICE N/A 01/10/2016   Procedure: Loop Recorder Insertion;  Surgeon: [name], MD;  Location: [facility];  Service: Cardiovascular;  Laterality: N/A;  . IR GENERIC HISTORICAL  08/23/2016   IR US GUIDE VASC ACCESS RIGHT 08/23/2016 Corrie Mckusick, DO [facility]  . IR GENERIC HISTORICAL  08/23/2016   IR PERCUTANEOUS ART THROMBECTOMY/INFUSION INTRACRANIAL INC DIAG ANGIO 08/23/2016 Corrie Mckusick, DO [facility]  . IR GENERIC HISTORICAL  08/23/2016   IR ANGIO INTRA EXTRACRAN SEL COM CAROTID INNOMINATE UNI L MOD SED 08/23/2016 Corrie Mckusick, DO [facility]  . RADIOLOGY WITH ANESTHESIA Bilateral 08/23/2016   Procedure: RADIOLOGY WITH ANESTHESIA;  Surgeon:  Medication Radiologist, MD;  Location: [place];  Service: Radiology;  Laterality: Bilateral;  . THROMBECTOMY BRACHIAL ARTERY Left 08/23/2016   Procedure: Left Radial, Ulnar, Brachial and Axillary Embolectomy;  Surgeon: Elam Dutch, MD;  Location: [place];  Service: Vascular;  Laterality: Left;  . THYROIDECTOMY    . TONSILLECTOMY    . TOTAL ABDOMINAL HYSTERECTOMY    . VITRECTOMY AND CATARACT      Assessment & Plan Clinical Impression: Patient is a 83 y.o.femalewith a h/o A fib/A flutter--refused anticoagulation, aortic stenosis, HTN, piror CVA who was admitted on 08/23/16 with acute onset of LUE paralysis. CT at [facility] revealed subacute L-cerebellar stroke and CTA LUE showed occlusion of distal left axillary artery approximately 5 cm with distal reconstitution of brachial artery at mid proximal humerus. Dr. Oneida Alar consulted and patient taken to OR for Left brachial, radial, ulnar, axillary embolectomy the same day. Post procedure patient noted to have left facial droop, rightward gaze, slurred speech and left hemiparesis with CTA revealing R-MCA embolus. She underwent thrombectomy with revascularization of R-MCA with restoration of TICI flow. She was extubated without difficulty on 1/22 and maintained on IV heparin. 2D echo with EF 65-70% with mild concentric LVH and vigorous systolic function. Dr Leonie Man felt stroke embolic due to A fib and patient continues on low dose ASA. Dr. Rayann Heman consulted for input and recommended Eliquis once cleared by neurology. Patient transferred to CIR on 08/28/2016 .   Patient currently requires min with mobility secondary to muscle weakness, decreased cardiorespiratoy endurance  and decreased oxygen support and decreased standing balance and decreased balance strategies.  Prior to hospitalization, patient was modified independent  with mobility and lived with Alone in a [place] home.  Home access is  Level entry.  Patient will benefit from skilled PT intervention to  maximize safe functional mobility, minimize fall risk and decrease caregiver burden for planned discharge home with intermittent assist.  Anticipate patient will benefit from follow up [facility] at discharge.  PT - End of Session Activity Tolerance: Tolerates < 10 min activity with changes in vital signs Endurance Deficit: Yes Endurance Deficit Description: frequent rest breaks; monitor SpO2 PT Assessment Rehab Potential (ACUTE/IP ONLY): Excellent Barriers to Discharge: Decreased caregiver support Barriers to Discharge Comments: Lives alone but plans to hire caregiver PT Patient demonstrates impairments in the following area(s): Balance;Endurance;Motor;Safety;Edema;Nutrition;Pain;Skin Integrity PT Transfers Functional Problem(s): Bed Mobility;Bed to Chair;Car;Furniture;Floor PT Locomotion Functional Problem(s): Ambulation;Stairs;Wheelchair Mobility PT Plan PT Intensity: Minimum of 1-2 x/day ,45 to 90 minutes PT Frequency: 5 out of 7 days PT Duration Estimated Length of Stay: 10-14 days  PT Treatment/Interventions: Ambulation/gait training;Balance/vestibular training;Cognitive remediation/compensation;Community reintegration;Discharge planning;Disease management/prevention;DME/adaptive equipment instruction;Functional mobility training;Neuromuscular re-education;Pain management;Patient/family education;Psychosocial support;Skin care/wound management;Stair training;Therapeutic Activities;Therapeutic Exercise;UE/LE Strength taining/ROM;UE/LE Coordination activities;Visual/perceptual remediation/compensation;Wheelchair propulsion/positioning PT Transfers Anticipated Outcome(s): Mod I- supervision assist  PT Locomotion Anticipated Outcome(s): supervision assist with LRAD  PT Recommendation Recommendations for Other Services: Neuropsych consult;Therapeutic Recreation consult Therapeutic Recreation Interventions: Kitchen group;Pet therapy Follow Up Recommendations: Home health PT Patient destination:  Home Equipment Recommended: Wheelchair (measurements);Wheelchair cushion (measurements);Rolling walker with 5" wheels;To be determined     Skilled Therapeutic Intervention Pt received sitting in WC and agreeable to PT. PT instructed patient in Evaluation and initiated treatment intervention; see below for results. PT educated patient in basic transfers, toilet transfer, car transfer, gait HHA and with RW, Overall Pt required min assist with significant improvement in safety with RW. SpO2 decreased to <90% with each activity, but increased to WNL in less than 30sec rest. PT also educated patient in [place], Discharge recommendations, and goals for rehab. Patient returned to room and left sitting in [facility] with call bell in reach and all needs met.    PT Evaluation Precautions/Restrictions Precautions Precautions: Fall Precaution Comments: watch sats Restrictions Weight Bearing Restrictions: No General   Vital Signs Pain Pain Assessment Pain Assessment: No/denies pain Pain Score: 0-No pain Home Living/Prior Functioning Home Living Available Help at Discharge: Family;Available PRN/intermittently;Friend(s) Type of Home: Apartment Home Access: Level entry Home Layout: One level Bathroom Shower/Tub: Chiropodist: Standard Bathroom Accessibility: Yes Additional Comments: Pt reports a friend will stay with her 24/7  Lives With: Alone Prior Function Level of Independence: Requires assistive device for independence;Independent with basic ADLs  Able to Take Stairs?: Yes Driving: Yes Vocation: Retired Comments: Uses SPC for ambulation; has cleaning lady. Drives. Per chart, pt with h/o falls, but she denies this during eval  Vision/Perception  Vision - Assessment Additional Comments: Left field cut; pt able to compensate  Cognition Overall Cognitive Status: Within Functional Limits for tasks assessed Arousal/Alertness: Awake/alert Orientation Level: Oriented X4 Attention:  Selective Sustained Attention: Appears intact Selective Attention: Appears intact Memory: Appears intact Memory Impairment: Decreased recall of new information (mild impairments) Awareness: Appears intact Problem Solving: Appears intact Safety/Judgment: Appears intact Sensation Sensation Light Touch: Appears Intact Proprioception: Appears Intact Coordination Gross Motor Movements are Fluid and Coordinated: Yes Fine Motor Movements are Fluid and Coordinated: Yes Finger Nose Finger Test: WFL. delayed speed bilaterally Motor  Motor Motor: Other (comment) Motor -  Skilled Clinical Observations: Generalized weakness  Mobility Bed Mobility Bed Mobility: Rolling Right;Rolling Left;Sit to Supine;Supine to Sit Rolling Right: 5: Supervision Rolling Right Details: Verbal cues for technique;Verbal cues for precautions/safety Rolling Left: 5: Supervision Rolling Left Details: Verbal cues for technique;Verbal cues for precautions/safety Supine to Sit: 5: Supervision Supine to Sit Details: Verbal cues for technique;Verbal cues for precautions/safety Sit to Supine: 5: Supervision Sit to Supine - Details: Verbal cues for sequencing;Verbal cues for technique Transfers Transfers: Yes Sit to Stand: 4: Min assist Sit to Stand Details: Verbal cues for technique;Verbal cues for precautions/safety Stand Pivot Transfers: 4: Min assist Stand Pivot Transfer Details: Verbal cues for technique;Verbal cues for precautions/safety;Verbal cues for safe use of DME/AE   Locomotion  Ambulation Ambulation: Yes Ambulation/Gait Assistance: 4: Min assist Ambulation Distance (Feet): 20 Feet Assistive device: Rolling walker Gait Gait: Yes Gait Pattern: Impaired Gait Pattern: Step-to pattern Wheelchair Mobility Wheelchair Mobility: Yes Wheelchair Assistance: 5: Investment banker, operational Details: Verbal cues for Marketing executive: Both upper extremities Wheelchair Parts Management:  Needs assistance Distance: 37f   Trunk/Postural Assessment  Cervical Assessment Cervical Assessment: Exceptions to [facility](forward head) Thoracic Assessment Thoracic Assessment: Exceptions to [facility](rounded shoulders) Lumbar Assessment Lumbar Assessment: Exceptions to [facility](Posterior pelvic tilt) Postural Control Postural Control: Within Functional Limits  Balance Balance Balance Assessed: Yes Static Sitting Balance Static Sitting - Level of Assistance: 6: Modified independent (Device/Increase time) Dynamic Sitting Balance Dynamic Sitting - Level of Assistance: 5: Stand by assistance Static Standing Balance Static Standing - Balance Support: No upper extremity supported Static Standing - Level of Assistance: 4: Min assist Dynamic Standing Balance Dynamic Standing - Balance Support: No upper extremity supported Dynamic Standing - Level of Assistance: 4: Min assist Dynamic Standing - Balance Activities: Forward lean/weight shifting Extremity Assessment  RUE Assessment RUE Assessment: Within Functional Limits (4/5) LUE Assessment LUE Assessment: Within Functional Limits (4/5 overall) RLE Assessment RLE Assessment: Exceptions to WL[facility]oscopy CenterRLE AROM (degrees) RLE Overall AROM Comments: WFL RLE Strength RLE Overall Strength Comments: Hip flexion 4-/5. all others tested 4/5.  LLE Assessment LLE Assessment: Exceptions to WFL LLE AROM (degrees) LLE Overall AROM Comments: WFL LLE Strength LLE Overall Strength Comments: Hip flexion 4-/5. all others tested 4/5.    See Function Navigator for Current Functional Status.   Refer to Care Plan for Long Term Goals  Recommendations for other services: Neuropsych and Therapeutic Recreation  Pet therapy and Kitchen group  Discharge Criteria: Patient will be discharged from PT if patient refuses treatment 3 consecutive times without medical reason, if treatment goals not met, if there is a change in medical status, if patient makes no progress  towards goals or if patient is discharged from hospital.  The above assessment, treatment plan, treatment alternatives and goals were discussed and mutually agreed upon: by patient  ALorie Phenix1/27/2018, 12:59 PM

## 2016-08-29 NOTE — Evaluation (Signed)
Speech Language Pathology Assessment and Plan  Patient Details  Name: Christine Pugh MRN: [id number] Date of Birth: [birth year]  Evaluation Only   Today's Date: 08/29/2016 SLP Individual Time: 1100-1200 SLP Individual Time Calculation (min): 60 min   Problem List:  Patient Active Problem List   Diagnosis Date Noted  . Embolic stroke involving middle cerebral artery ([place]) 08/28/2016  . Embolism of right middle cerebral artery 08/24/2016  . Thromboembolic stroke ([place]) 33/82/5053  . Ischemia of extremity 08/23/2016  . Syncope 01/06/2016  . Atypical atrial flutter ([place]) 10/28/2015  . Essential hypertension 10/28/2015  . Aortic valve disorder 05/19/2009  . SVT/ PSVT/ PAT 05/19/2009   Past Medical History:  Past Medical History:  Diagnosis Date  . Aortic insufficiency    Mild  . Aortic stenosis    Moderate to severe  . Atypical atrial flutter ([place])   . Benign essential HTN   . Embolic stroke involving right cerebellar artery ([place])   . Hypothyroidism   . Ischemia of extremity    Past Surgical History:  Past Surgical History:  Procedure Laterality Date  . APPENDECTOMY    . BREAST LUMPECTOMY    . dental implant    . EP IMPLANTABLE DEVICE N/A 01/10/2016   Procedure: Loop Recorder Insertion;  Surgeon: [name], MD;  Location: [place] [facility];  Service: Cardiovascular;  Laterality: N/A;  . IR GENERIC HISTORICAL  08/23/2016   IR US GUIDE VASC ACCESS RIGHT 08/23/2016 Corrie Mckusick, DO [facility]  . IR GENERIC HISTORICAL  08/23/2016   IR PERCUTANEOUS ART THROMBECTOMY/INFUSION INTRACRANIAL INC DIAG ANGIO 08/23/2016 Corrie Mckusick, DO [facility]  . IR GENERIC HISTORICAL  08/23/2016   IR ANGIO INTRA EXTRACRAN SEL COM CAROTID INNOMINATE UNI L MOD SED 08/23/2016 Corrie Mckusick, DO [facility]  . RADIOLOGY WITH ANESTHESIA Bilateral 08/23/2016   Procedure: RADIOLOGY WITH ANESTHESIA;  Surgeon: Medication Radiologist, MD;  Location: [place];  Service: Radiology;  Laterality: Bilateral;  .  THROMBECTOMY BRACHIAL ARTERY Left 08/23/2016   Procedure: Left Radial, Ulnar, Brachial and Axillary Embolectomy;  Surgeon: Elam Dutch, MD;  Location: [place];  Service: Vascular;  Laterality: Left;  . THYROIDECTOMY    . TONSILLECTOMY    . TOTAL ABDOMINAL HYSTERECTOMY    . VITRECTOMY AND CATARACT      Assessment / Plan / Recommendation Clinical Impression [name] a 83 y.o.femalewith a h/o A fib/A flutter--refused anticoagulation, aortic stenosis, HTN, piror CVA who was admitted on 08/23/16 with acute onset of LUE paralysis. [facility] revealed subacute L-cerebellar stroke and CTA LUE showed occlusion of distal left axillary artery approximately 5 cm with distal reconstitution of brachial artery at mid proximal humerus. Dr. Oneida Alar consulted and patient taken to OR for Left brachial, radial, ulnar, axillary embolectomy the same day. Post procedure patient noted to have left facial droop, rightward gaze, slurred speech and left hemiparesis with CTA revealing R-MCA embolus. She underwent thrombectomy with revascularization of R-MCA with restoration of TICI flow. She was extubated without difficulty on 1/22 and maintained on IV heparin. 2D echo with EF 65-70% with mild concentric LVH and vigorous systolic function. Dr Leonie Man felt stroke embolic due to A fib and patient continues on low dose ASA. Dr. Rayann Heman consulted for input and recommended Eliquis once cleared by neurology. Pt received dysphagia therapy and was discharge from services on regular diet with thin liquids. Patient with resultant balance deficits, dyspnea with activity and left visual field deficits. Therapy ongoing and CIR recommended for follow up  therapy.   Pt admitted to CIR on 08/28/16 with cognitive linguistic evaluation completed on 08/29/16. Pt's cognitive linguistic abilities appear to be within the normal range as she scored a 22 out of 22 on the [place] with any score over 18 considered to be within the average  range. Pt continues to demonstrate left visual field deficits but independently compensates and is able to effectively navigate her environment, iPad and iphone without error. ST consulted with other disciplines and not deficts indicated that require skilled ST services.    Skilled Therapeutic Interventions          Cognitive linguistic evaluation completed, see above, Pt also able to complete complex problem solving tasks with appropriate attention and problem solving abilities. ST also consulted other disciplines with no deficits identified for ST to target. Of note, pt consuming regular snack with thin liquids without overt s/s of aspiration. No further services are indicated. Pt left upright in wheelchair with all needs within reach.     SLP Assessment  Patient does not need any further Speech [place] Pathology Services    Recommendations   Pain Patient destination: Home Follow up Recommendations: None Equipment Recommended: None recommended by SLP    Pain Assessment Pain Assessment: No/denies pain Pain Score: 0-No pain  Prior Functioning Cognitive/Linguistic Baseline: Within functional limits Type of Home: Apartment  Lives With: Alone Available Help at Discharge: Family;Available PRN/intermittently;Friend(s) Vocation: Retired  Function:  Eating Eating   Modified Consistency Diet: No Eating Assist Level: No help, No cues           Cognition Comprehension Comprehension assist level: Follows complex conversation/direction with no assist  Expression   Expression assist level: Expresses complex ideas: With no assist  Social Interaction Social Interaction assist level: Interacts appropriately with others - No medications needed.  Problem Solving Problem solving assist level: Solves complex problems: Recognizes & self-corrects  Memory Memory assist level: Complete Independence: No helper    Refer to Care Plan for Long Term Goals  Recommendations for other services:  None   Discharge Criteria: Patient will be discharged from SLP if patient refuses treatment 3 consecutive times without medical reason, if treatment goals not met, if there is a change in medical status, if patient makes no progress towards goals or if patient is discharged from hospital.  The above assessment, treatment plan, treatment alternatives and goals were discussed and mutually agreed upon: by patient   B. Rutherford Nail, M.S., CCC-SLP Speech-Language Pathologist    08/29/2016, 12:08 PM

## 2016-08-29 NOTE — Discharge Instructions (Addendum)
Inpatient Rehab Discharge Instructions  Christine Pugh Discharge date and time:  09/05/16   Activities/Precautions/ Functional Status: Activity: no lifting, driving, or strenuous exercise for till cleared by MD Diet: cardiac diet Low salt. Drink plenty of fluids.  Wound Care: none needed   Functional status:  ___ No restrictions     ___ Walk up steps independently _X__ 24/7 supervision/assistance   ___ Walk up steps with assistance ___ Intermittent supervision/assistance  ___ Bathe/dress independently ___ Walk with walker     _X__ Bathe/dress with assistance ___ Walk Independently    ___ Shower independently ___ Walk with assistance    ___ Shower with assistance _X__ No alcohol     ___ Return to work/school ________  Special Instructions: 1.  Contact MD for any signs of bleeding or worsening of hemorrhoids.   2. Need to increase fluid intake to at least 1500 cc daily.    COMMUNITY REFERRALS UPON DISCHARGE:    Home Health:   PT,OT,RN   [place]   Date of last service:09/05/2016  Medical Equipment/Items Ordered:HAS NEEDED EQUIPMENT     GENERAL COMMUNITY RESOURCES FOR PATIENT/FAMILY: Support Groups:[facility] SUPPORT GROUP EVERY SECOND Thursday @ 3:00-4:00 PM ON THE REHAB UNIT QUESTIONS CONTACT CAITLYN [id number]  STROKE/TIA DISCHARGE INSTRUCTIONS SMOKING Cigarette smoking nearly doubles your risk of having a stroke & is the single most alterable risk factor  If you smoke or have smoked in the last 12 months, you are advised to quit smoking for your health.  Most of the excess cardiovascular risk related to smoking disappears within a year of stopping.  Ask you doctor about anti-smoking medications  [place] Quit Line: 1-800-QUIT NOW  Free Smoking Cessation Classes [phone]  CHOLESTEROL Know your levels; limit fat & cholesterol in your diet  Lipid Panel     Component Value Date/Time   CHOL 164 08/25/2016 0331   TRIG 69 08/25/2016 0331   HDL 56 08/25/2016 0331   CHOLHDL 2.9 08/25/2016 0331   VLDL 14 08/25/2016 0331   LDLCALC 94 08/25/2016 0331      Many patients benefit from treatment even if their cholesterol is at goal.  Goal: Total Cholesterol (CHOL) less than 160  Goal:  Triglycerides (TRIG) less than 150  Goal:  HDL greater than 40  Goal:  LDL (LDLCALC) less than 100   BLOOD PRESSURE American Stroke Association blood pressure target is less that 120/80 mm/Hg  Your discharge blood pressure is:  BP: (!) 119/51  Monitor your blood pressure  Limit your salt and alcohol intake  Many individuals will require more than one medication for high blood pressure  DIABETES (A1c is a blood sugar average for last 3 months) Goal HGBA1c is under 7% (HBGA1c is blood sugar average for last 3 months)  Diabetes: No known diagnosis of diabetes    Lab Results  Component Value Date   HGBA1C 5.7 (H) 08/25/2016     Your HGBA1c can be lowered with medications, healthy diet, and exercise.  Check your blood sugar as directed by your physician  Call your physician if you experience unexplained or low blood sugars.  PHYSICAL ACTIVITY/REHABILITATION Goal is 30 minutes at least 4 days per week  Activity: No driving, Therapies: See above Return to work: N/A  Activity decreases your risk of heart attack and stroke and makes your heart stronger.  It helps control your weight and blood pressure; helps you relax and can improve your mood.  Participate in a regular exercise program.  Talk  with your doctor about the best form of exercise for you (dancing, walking, swimming, cycling).  DIET/WEIGHT Goal is to maintain a healthy weight  Your discharge diet is: Diet Heart Room service appropriate? Yes; Fluid consistency: Thin  liquids Your height is:    Your current weight is: Weight: 70.4 kg (155 lb 4.8 oz) Your Body Mass Index (BMI) is:     Following the type of diet specifically designed for you will help prevent another  stroke.  You are at goal weight.   Your goal Body Mass Index (BMI) is 19-24.  Healthy food habits can help reduce 3 risk factors for stroke:  High cholesterol, hypertension, and excess weight.  RESOURCES Stroke/Support Group:  Call [phone]   STROKE EDUCATION PROVIDED/REVIEWED AND GIVEN TO PATIENT Stroke warning signs and symptoms How to activate emergency medical system (call 911). Medications prescribed at discharge. Need for follow-up after discharge. Personal risk factors for stroke. Pneumonia vaccine given:  Flu vaccine given:  My questions have been answered, the writing is legible, and I understand these instructions.  I will adhere to these goals & educational materials that have been provided to me after my discharge from the hospital.     My questions have been answered and I understand these instructions. I will adhere to these goals and the provided educational materials after my discharge from the hospital.  Patient/Caregiver Signature _______________________________ Date __________  Clinician Signature _______________________________________ Date __________  Please bring this form and your medication list with you to all your follow-up doctor's appointments.   Information on my medicine - ELIQUIS (apixaban)  This medication education was reviewed with me or my healthcare representative as part of my discharge preparation.  The pharmacist that spoke with me during my hospital stay was:  Einar Grad, [facility]  Why was Eliquis prescribed for you? Eliquis was prescribed for you to reduce the risk of a blood clot forming that can cause a stroke if you have a medical condition called atrial fibrillation (a type of irregular heartbeat).  What do You need to know about Eliquis ? Take your Eliquis TWICE DAILY - one tablet in the morning and one tablet in the evening with or without food. If you have difficulty swallowing the tablet whole please discuss with your  pharmacist how to take the medication safely.  Take Eliquis exactly as prescribed by your doctor and DO NOT stop taking Eliquis without talking to the doctor who prescribed the medication.  Stopping may increase your risk of developing a stroke.  Refill your prescription before you run out.  After discharge, you should have regular check-up appointments with your healthcare provider that is prescribing your Eliquis.  In the future your dose may need to be changed if your kidney function or weight changes by a significant amount or as you get older.  What do you do if you miss a dose? If you miss a dose, take it as soon as you remember on the same day and resume taking twice daily.  Do not take more than one dose of ELIQUIS at the same time to make up a missed dose.  Important Safety Information A possible side effect of Eliquis is bleeding. You should call your healthcare provider right away if you experience any of the following: ? Bleeding from an injury or your nose that does not stop. ? Unusual colored urine (red or dark brown) or unusual colored stools (red or black). ? Unusual bruising for unknown reasons. ? A serious  fall or if you hit your head (even if there is no bleeding).  Some medicines may interact with Eliquis and might increase your risk of bleeding or clotting while on Eliquis. To help avoid this, consult your healthcare provider or pharmacist prior to using any new prescription or non-prescription medications, including herbals, vitamins, non-steroidal anti-inflammatory drugs (NSAIDs) and supplements.  This website has more information on Eliquis (apixaban): http://www.eliquis.com/eliquis/home

## 2016-08-30 ENCOUNTER — Inpatient Hospital Stay (HOSPITAL_COMMUNITY): Payer: Medicare Other | Admitting: Speech Pathology

## 2016-08-30 ENCOUNTER — Inpatient Hospital Stay (HOSPITAL_COMMUNITY): Payer: Medicare Other | Admitting: *Deleted

## 2016-08-30 LAB — MAGNESIUM: MAGNESIUM: 1.7 mg/dL (ref 1.7–2.4)

## 2016-08-30 MED ORDER — FUROSEMIDE 20 MG PO TABS
20.0000 mg | ORAL_TABLET | Freq: Once | ORAL | Status: AC
  Administered 2016-08-30: 20 mg via ORAL
  Filled 2016-08-30: qty 1

## 2016-08-30 NOTE — Progress Notes (Signed)
incontinent of urine, before getting up to [facility], then voided continently. Wears a pad, which is usually saturated with each void. O2 at 2L/m via [place]. Expiratory wheeze to upper lobes. Congested, non productive cough. BLE with pitting edema. LUE discolored-2 plus radial pulse. Calling for assistance to [facility], no unsafe behaviors. Patrici Ranks A

## 2016-08-30 NOTE — Progress Notes (Signed)
Christine Pugh is a 83 y.o. female 1921-12-21 [id number]  Subjective: No new complaints. No new problems. SOB w/exertion  Objective: Vital signs in last 24 hours: Temp:  [98.5 F (36.9 C)-98.6 F (37 C)] 98.6 F (37 C) (01/28 0527) Pulse Rate:  [64-65] 65 (01/28 0527) Resp:  [18] 18 (01/28 0527) BP: (98-122)/(46-55) 98/46 (01/28 0527) SpO2:  [98 %-99 %] 98 % (01/28 0527) Weight change:  Last BM Date: 08/29/16  Intake/Output from previous day: 01/27 0701 - 01/28 0700 In: 480 [P.O.:480] Out: -  Last cbgs: CBG (last 3)  No results for input(s): GLUCAP in the last 72 hours.   Physical Exam General: No apparent distress   HEENT: not dry Lungs: Normal effort. Lungs w/decr BS at bases. O2 is on Cardiovascular: Irreg rate Abdomen: S/NT/ND; BS(+) Musculoskeletal:  unchanged Neurological: No new neurological deficits Wounds: N/A    Skin: clear  Aging changes Mental state: Alert, oriented, cooperative    Lab Results: BMET    Component Value Date/Time   NA 135 08/29/2016 0516   K 3.9 08/29/2016 0516   CL 100 (L) 08/29/2016 0516   CO2 27 08/29/2016 0516   GLUCOSE 131 (H) 08/29/2016 0516   BUN 8 08/29/2016 0516   CREATININE 0.64 08/29/2016 0516   CALCIUM 7.9 (L) 08/29/2016 0516   GFRNONAA >60 08/29/2016 0516   GFRAA >60 08/29/2016 0516   CBC    Component Value Date/Time   WBC 9.6 08/29/2016 0516   RBC 3.11 (L) 08/29/2016 0516   HGB 9.8 (L) 08/29/2016 0516   HCT 29.8 (L) 08/29/2016 0516   PLT 235 08/29/2016 0516   MCV 95.8 08/29/2016 0516   MCH 31.5 08/29/2016 0516   MCHC 32.9 08/29/2016 0516   RDW 14.1 08/29/2016 0516   LYMPHSABS 1.1 08/29/2016 0516   MONOABS 1.3 (H) 08/29/2016 0516   EOSABS 0.3 08/29/2016 0516   BASOSABS 0.0 08/29/2016 0516    Studies/Results: Dg Chest 2 View  Result Date: 08/28/2016 CLINICAL DATA:  Shortness of Breath EXAM: CHEST  2 VIEW COMPARISON:  08/11/2015. FINDINGS: Cardiomegaly is noted. Mild interstitial prominence bilateral  upper lobes suspicious for mild interstitial edema. There is superimposed airspace disease in right upper lobe suspicious for infiltrate/pneumonia. Small bilateral pleural effusion with bilateral basilar atelectasis or infiltrate best seen on lateral view. Osteopenia and degenerative changes thoracic spine. IMPRESSION: Mild interstitial prominence bilateral upper lobes suspicious for mild interstitial edema. There is superimposed airspace disease in right upper lobe suspicious for infiltrate/pneumonia. Small bilateral pleural effusion with bilateral basilar atelectasis or infiltrate best seen on lateral view. Electronically Signed   By: Lahoma Crocker M.D.   On: 08/28/2016 17:15    Medications: I have reviewed the patient's current medications.  A/P:  1. Right middle cerebral artery CVA. Continue with rehabilitation services.  2. DVT prophylaxis with Eliquis 3. Atrial fibrillation. Eliquis and Amiodarone 4. Hypertension. Continue with Dyazide. 5. Status post left upper extremity embolectomy 6. Congestive heart failure - will try to add a low dose of furosemide. Continue with oxygen   Length of stay, days: 2  Walker Kehr , MD 08/30/2016, 10:15 AM

## 2016-08-30 NOTE — Progress Notes (Signed)
Physical Therapy Session Note  Patient Details  Name: QUANA MCLANAHAN MRN: [id number] Date of Birth: 1922-04-12  Today's Date: 08/30/2016 PT Individual Time: 1130-1200 PT Individual Time Calculation (min): 30 min     Skilled Therapeutic Interventions/Progress Updates:  Patient in bed, no reports of pain or discomfort , agrees to therapy interventions, coming to sit EOB with supervision but increased time needed. Sit to stand x 4 with Supervision and cues for weight shift forward and erect position in initial stance.  Gait Training with RW 3 x 20 feet 1 x 50 feet with seated rest breaks between due to generalized fatigue and shortness of breath, O2 delivered via [place] at 2 L/min , Saturation maintained at 90 % through the session.  At the end of therapy intervention patient returned to room, left in w/c with all needs within reach.  Therapy Documentation Precautions:  Precautions Precautions: Fall Precaution Comments: watch sats Restrictions Weight Bearing Restrictions: No   See Function Navigator for Current Functional Status.   Therapy/Group: Individual Therapy  [place] Spanish 08/30/2016, 3:38 PM

## 2016-08-31 ENCOUNTER — Inpatient Hospital Stay (HOSPITAL_COMMUNITY): Payer: Medicare Other | Admitting: Speech Pathology

## 2016-08-31 ENCOUNTER — Inpatient Hospital Stay (HOSPITAL_COMMUNITY): Payer: Medicare Other | Admitting: Physical Therapy

## 2016-08-31 ENCOUNTER — Inpatient Hospital Stay (HOSPITAL_COMMUNITY): Payer: Medicare Other | Admitting: Occupational Therapy

## 2016-08-31 DIAGNOSIS — E8779 Other fluid overload: Secondary | ICD-10-CM

## 2016-08-31 LAB — MAGNESIUM: MAGNESIUM: 1.7 mg/dL (ref 1.7–2.4)

## 2016-08-31 MED ORDER — FUROSEMIDE 10 MG/ML IJ SOLN
40.0000 mg | Freq: Once | INTRAMUSCULAR | Status: AC
Start: 2016-08-31 — End: 2016-08-31
  Administered 2016-08-31: 40 mg via INTRAVENOUS
  Filled 2016-08-31: qty 4

## 2016-08-31 NOTE — Plan of Care (Signed)
Problem: RH BOWEL ELIMINATION Goal: RH STG MANAGE BOWEL WITH ASSISTANCE STG Manage Bowel with min. Assistance.   Outcome: Not Progressing Incontinent of stool on day shift, requiring total assist.

## 2016-08-31 NOTE — Plan of Care (Signed)
Problem: RH BLADDER ELIMINATION Goal: RH STG MANAGE BLADDER WITH ASSISTANCE STG Manage Bladder With min Assistance   Outcome: Not Progressing Incontinent of urine, requiring max assistance

## 2016-08-31 NOTE — Progress Notes (Signed)
Social Work Assessment and Plan Social Work Assessment and Plan  Patient Details  Name: Christine Pugh MRN: [id number] Date of Birth: [birth year]  Today's Date: 08/31/2016  Problem List:  Patient Active Problem List   Diagnosis Date Noted  . Embolic stroke involving middle cerebral artery ([place]) 08/28/2016  . Embolism of right middle cerebral artery 08/24/2016  . Thromboembolic stroke ([place]) [id number]  . Ischemia of extremity 08/23/2016  . Syncope 01/06/2016  . Atypical atrial flutter ([place]) 10/28/2015  . Essential hypertension 10/28/2015  . Aortic valve disorder 05/19/2009  . SVT/ PSVT/ PAT 05/19/2009   Past Medical History:  Past Medical History:  Diagnosis Date  . Aortic insufficiency    Mild  . Aortic stenosis    Moderate to severe  . Atypical atrial flutter ([place])   . Benign essential HTN   . Embolic stroke involving right cerebellar artery ([place])   . Hypothyroidism   . Ischemia of extremity    Past Surgical History:  Past Surgical History:  Procedure Laterality Date  . APPENDECTOMY    . BREAST LUMPECTOMY    . dental implant    . EP IMPLANTABLE DEVICE N/A 01/10/2016   Procedure: Loop Recorder Insertion;  Surgeon: [name], MD;  Location: [facility] [facility];  Service: Cardiovascular;  Laterality: N/A;  . IR GENERIC HISTORICAL  08/23/2016   IR US GUIDE VASC ACCESS RIGHT 08/23/2016 Corrie Mckusick, DO [facility]  . IR GENERIC HISTORICAL  08/23/2016   IR PERCUTANEOUS ART THROMBECTOMY/INFUSION INTRACRANIAL INC DIAG ANGIO 08/23/2016 Corrie Mckusick, DO [facility]  . IR GENERIC HISTORICAL  08/23/2016   IR ANGIO INTRA EXTRACRAN SEL COM CAROTID INNOMINATE UNI L MOD SED 08/23/2016 Corrie Mckusick, DO [facility]  . RADIOLOGY WITH ANESTHESIA Bilateral 08/23/2016   Procedure: RADIOLOGY WITH ANESTHESIA;  Surgeon: Medication Radiologist, MD;  Location: [facility];  Service: Radiology;  Laterality: Bilateral;  . THROMBECTOMY BRACHIAL ARTERY Left 08/23/2016   Procedure: Left Radial, Ulnar,  Brachial and Axillary Embolectomy;  Surgeon: Elam Dutch, MD;  Location: [facility] [place];  Service: Vascular;  Laterality: Left;  . THYROIDECTOMY    . TONSILLECTOMY    . TOTAL ABDOMINAL HYSTERECTOMY    . VITRECTOMY AND CATARACT     Social History:  reports that she has never smoked. She has never used smokeless tobacco. She reports that she does not drink alcohol or use drugs.  Family / Support Systems Marital Status: Widow/Widower Patient Roles: Parent, Other (Comment) (Sister) Children: Kerman Passey [phone]  Daughter in [place] Other Supports: Friends and church members Anticipated Caregiver: Pt planning to hire someone to assist her Ability/Limitations of Caregiver: Contacting to see if can hire her pastor's sister who is retired Careers adviser: Other ([place]) (Working on a plan for herself) Family Dynamics: Close with her son who is in [place] and daughter who is [place], she has numerous grandchildren and great grandchildren. All are spread out but call her and follow how she is doing.  Son is the caregiver for his wfie diagnosed with early alzhiemers  Social History Preferred language: English Religion: Christian Cultural Background: No issues Education: [employer] Read: Yes Write: Yes Employment Status: Retired Freight forwarder Issues: No issues Guardian/Conservator: None-according to MD pt is capable of making her own decisions while here.   Abuse/Neglect Physical Abuse: Denies Verbal Abuse: Denies Sexual Abuse: Denies Exploitation of patient/patient's resources: Denies Self-Neglect: Denies  Emotional Status Pt's affect, behavior adn adjustment status: Pt is motivated and not happy about having two strokes, after declining  to take blood thinner. She is on eliquois now and she is hopeful she will do well and not have anymore strokes. She has always taken care of herself and plans to once again. Recent Psychosocial Issues: relatively healthy  doing well and living alone and driving and traveling to see family members Pyschiatric History: No history deferred depression screen due to doing well and able to express herself and feelings regarding her strokes and recovery Substance Abuse History: No issues  Patient / Family Perceptions, Expectations & Goals Pt/Family understanding of illness & functional limitations: Pt has a good understanding of her strokes and deficits. She has made some progress which is encouraging to her. She talks with the MD and feels she has a clear understanding of her treatment plan. Premorbid pt/family roles/activities: Mother, grandmother, great grandmother, retiree, church member, etc Anticipated changes in roles/activities/participation: resume Pt/family expectations/goals: Pt states: " I want to get as good as I can, I know I will need some help at home when I go."  Son states: " We can make arrangements for her, she knows many people in [place]."  [employer]: None Premorbid Home Care/DME Agencies: None Transportation available at discharge: Son and friends Resource referrals recommended: Support group (specify)  Discharge Planning Living Arrangements: Alone Support Systems: Children, Water engineer, Social worker community Type of Residence: Private residence Insurance underwriter Resources: [employer], Multimedia programmer (specify) Archivist) Museum/gallery curator Resources: Radio broadcast assistant Screen Referred: No Living Expenses: Own Money Management: Patient Does the patient have any problems obtaining your medications?: No Home Management: Hired a cleaning woman once a month for the heavy cleaning. Pt was doing small tasks and meal prepartion Patient/Family Preliminary Plans: Return home with hired assistance, pt is working on this already. Son can check on but provides care to his wife. Will work with on caregiver for home. Once team conference will discuss plans and pt's  levels with both son and pt. Pt aware will need someone to provide supervision to pt. Social Work Anticipated Follow Up Needs: HH/OP, Support Group  Clinical Impression Pleasant independent female who is wanting to get back to her independent level. She has always taken care of herself and traveled to see family members. She realizes she will need someone to assist with her Care at home, she is hoping just home management and maybe bathing and dressing. Son supportive and willing to assist but is a caregiver for his wife also. Will work on safe discharge plan.  Elease Hashimoto 08/31/2016, 11:16 AM

## 2016-08-31 NOTE — Progress Notes (Signed)
Occupational Therapy Session Note  Patient Details  Name: Christine Pugh MRN: [id number] Date of Birth: [birth year]  Today's Date: 08/31/2016 OT Individual Time: [id number] OT Individual Time Calculation (min): 28 min    Skilled Therapeutic Interventions/Progress Updates:    Pt transferred from supine to sit EOB with supervision and then to wheelchair with use of the RW with min guard assist to begin session.  Had her sit in the wheelchair and work on BUE strengthening exercises using BUEs and 2 lb dowel rod.  Completed 1 set each of shoulder flexion and shoulder press for 10 repetitions as well as 2 sets of 20 repetitions for elbow flexion.  Mod instructional cueing needed for shoulder flexion technique.  Also had pt attempt to hit beach ball back with the dowel rod, while holding with both UEs but this was quite difficult and she was only successful about 50% with her reaction time.  Pt still with limited endurance and asking X 3 if we had to do more work in the short 28 min session.  Returned her to the room via wheelchair where she was left in the chair with call button and phone in reach.    Therapy Documentation Precautions:  Precautions Precautions: Fall Precaution Comments: watch sats Restrictions Weight Bearing Restrictions: No  Pain: Pain Assessment Pain Assessment: No/denies pain ADL: See Function Navigator for Current Functional Status.   Therapy/Group: Individual Therapy  Lenn Volker OTR/L 08/31/2016, 4:26 PM

## 2016-08-31 NOTE — IPOC Note (Signed)
Overall Plan of Care [facility]) Patient Details Name: Christine Pugh MRN: [id number] DOB: 1922/08/01  Admitting Diagnosis: CVA  Hospital Problems: Principal Problem:   Embolic stroke involving middle cerebral artery ([place]) Active Problems:   Atypical atrial flutter ([place])   Essential hypertension     Functional Problem List: Nursing Edema, Endurance, Medication Management, Pain, Skin Integrity, Bladder, Bowel  PT Balance, Endurance, Motor, Safety, Edema, Nutrition, Pain, Skin Integrity  OT Balance, Cognition, Vision, Endurance, Motor, Safety  SLP    TR         Basic ADL's: OT Grooming, Bathing, Dressing, Toileting     Advanced  ADL's: OT       Transfers: PT Bed Mobility, Bed to Chair, Car, Furniture, Futures trader, Metallurgist: PT Ambulation, Stairs, Wheelchair Mobility     Additional Impairments: OT None  SLP None      TR      Anticipated Outcomes Item Anticipated Outcome  Self Feeding Set up  Swallowing      Basic self-care  Supervision  Toileting  Supervision   Bathroom Transfers Supervision  Bowel/Bladder  patient will remain continent of bowel and bladder with min assist  Transfers  Mod I- supervision assist   Locomotion  supervision assist with LRAD   Communication     Cognition     Pain  pain will be equal to or less than 4/10 with min assist  Safety/Judgment  patient will be free from new skin injury and displaying sound safety judgement with min assist   Therapy Plan: PT Intensity: Minimum of 1-2 x/day ,45 to 90 minutes PT Frequency: 5 out of 7 days PT Duration Estimated Length of Stay: 10-14 days  OT Intensity: Minimum of 1-2 x/day, 45 to 90 minutes OT Frequency: 5 out of 7 days OT Duration/Estimated Length of Stay: 10-14 days         Team Interventions: Nursing Interventions Patient/Family Education, Pain Management, Bladder Management, Bowel Management, Medication Management, Skin Care/Wound Management, Discharge  Planning  PT interventions Ambulation/gait training, Balance/vestibular training, Cognitive remediation/compensation, Community reintegration, Discharge planning, Disease management/prevention, DME/adaptive equipment instruction, Functional mobility training, Neuromuscular re-education, Pain management, Patient/family education, Psychosocial support, Skin care/wound management, Stair training, Therapeutic Activities, Therapeutic Exercise, UE/LE Strength taining/ROM, UE/LE Coordination activities, Visual/perceptual remediation/compensation, Wheelchair propulsion/positioning  OT Interventions Training and development officer, Cognitive remediation/compensation, Community reintegration, Discharge planning, Functional mobility training, Neuromuscular re-education, Pain management, DME/adaptive equipment instruction, Patient/family education, Therapeutic Activities, Self Care/advanced ADL retraining, Splinting/orthotics, UE/LE Strength taining/ROM, Therapeutic Exercise, Wheelchair propulsion/positioning, UE/LE Coordination activities, Visual/perceptual remediation/compensation  SLP Interventions    TR Interventions    SW/CM Interventions Discharge Planning, Psychosocial Support, Patient/Family Education    Team Discharge Planning: Destination: PT-Home ,OT- Home , SLP-Home Projected Follow-up: PT-Home health PT, OT-  Home health OT, SLP-None Projected Equipment Needs: PT-Wheelchair (measurements), Wheelchair cushion (measurements), Rolling walker with 5" wheels, To be determined, OT- None recommended by OT, SLP-None recommended by SLP Equipment Details: PT- , OT-pt has TTB and elevated toilet seat Patient/family involved in discharge planning: PT- Patient,  OT-Patient, SLP-Patient  MD ELOS: 13-17d Medical Rehab Prognosis:  Excellent Assessment:  83 y.o.femalewith a h/o A fib/A flutter--refused anticoagulation, aortic stenosis, HTN, piror CVA who was admitted on 08/23/16 with acute onset of LUE paralysis. CT  at [facility] revealed subacute L-cerebellar stroke and CTA LUE showed occlusion of distal left axillary artery approximately 5 cm with distal reconstitution of brachial artery at mid proximal humerus. Dr. Oneida Alar consulted and patient taken to OR  for Left brachial, radial, ulnar, axillary embolectomy the same day. Post procedure patient noted to have left facial droop, rightward gaze, slurred speech and left hemiparesis with CTA revealing R-MCA embolus. She underwent thrombectomy with revascularization of R-MCA with restoration of TICI flow. She was extubated without difficulty on 1/22 and maintained on IV heparin. 2D echo with EF 65-70% with mild concentric LVH and vigorous systolic function. Dr Leonie Man felt stroke embolic due to A fib and patient continues on low dose ASA. Dr. Rayann Heman consulted for input and recommended Eliquis once cleared by neurology  Now requiring 24/7 Rehab RN,MD, as well as CIR level PT, OT and SLP.  Treatment team will focus on ADLs and mobility with goals set at Mod I Sup  See Team Conference Notes for weekly updates to the plan of care

## 2016-08-31 NOTE — Plan of Care (Signed)
Problem: RH BOWEL ELIMINATION Goal: RH STG MANAGE BOWEL WITH ASSISTANCE STG Manage Bowel with min. Assistance.   Outcome: Not Progressing Incont of bowel PTA using pads in underwear

## 2016-08-31 NOTE — Progress Notes (Signed)
Occupational Therapy Session Note  Patient Details  Name: Christine Pugh MRN: [id number] Date of Birth: 07-Jan-1922  Today's Date: 08/31/2016 OT Individual Time: 1000-1100 OT Individual Time Calculation (min): 60 min    Short Term Goals: Week 1:  OT Short Term Goal 1 (Week 1): STGs=LTGs  Skilled Therapeutic Interventions/Progress Updates:    Pt worked on tub/shower transfers using the [place] and tub bench like she has at home.  Min assist to complete with mod instructional cueing to stay closer to the walker and to turn the walker around so she's squared up to the surface she is going to sit down on.  Began education on kitchen safety using the RW as well with recommendation for walker bag or walker tray, (pt shown examples online).  Pt extremely fatigued during education on kitchen tasks, stating from the wheelchair she was just too fatigued to continue and needed to lay down.  Re-directed pt to continue working.  Utilized RW for transporting items up and down the counter.  She finally stated she needed to toilet and completed ambulation to the bathroom for BM.  Min guard assist for all toilet hygiene and for washing hands at end of session.  Transferred back to the wheelchair to complete session with return to the room and transfer to the bed with min guard assist.  Call button in reach.   Therapy Documentation Precautions:  Precautions Precautions: Fall Precaution Comments: watch sats Restrictions Weight Bearing Restrictions: No Vital Signs: Therapy Vitals Pulse Rate: 66 Oxygen Therapy SpO2: 96 % O2 Device: Not Delivered Pain: Pain Assessment Pain Assessment: No/denies pain  See Function Navigator for Current Functional Status.   Therapy/Group: Individual Therapy  Joelynn Dust OTR/L 08/31/2016, 11:03 AM

## 2016-08-31 NOTE — Progress Notes (Signed)
Occupational Therapy Session Note  Patient Details  Name: Christine Pugh MRN: [id number] Date of Birth: 26-Nov-1921  Today's Date: 08/31/2016 OT Individual Time: 4888-9169 OT Individual Time Calculation (min): 56 min    Short Term Goals: Week 1:  OT Short Term Goal 1 (Week 1): STGs=LTGs  Skilled Therapeutic Interventions/Progress Updates:    1:1 OT session focused on modified bathing/dressing, improved sit<>stand, and activity tolerance. SpO2 97% at rest on room air.. Bathing/dressing completed with Min A at sink level. Min cues for sequencing. Pt desat to 84% on room air during standing ADL- recovered quickly to >90 with rest and deep breathing technique. Pt completed 2 more sit<>stands, tolerating ~ 3 minutes in standing at longest bout. Pt was then educated on modified strategy to don TED, hose, but had difficulty reaching + fatigue, requiring Max A to don TED hose and non-skid socks. Pt left seated in wc at end of session with needs met and case manager present.   Therapy Documentation Precautions:  Precautions Precautions: Fall Precaution Comments: watch sats Restrictions Weight Bearing Restrictions: No  Pain: Pain Assessment Pain Assessment: No/denies pain  See Function Navigator for Current Functional Status.   Therapy/Group: Individual Therapy  Valma Cava 08/31/2016, 9:59 AM

## 2016-08-31 NOTE — Care Management Note (Signed)
[facility] Individual Statement of Services  Patient Name:  Christine Pugh  Date:  08/31/2016  Welcome to the [facility].  Our goal is to provide you with an individualized program based on your diagnosis and situation, designed to meet your specific needs.  With this comprehensive rehabilitation program, you will be expected to participate in at least 3 hours of rehabilitation therapies Monday-Friday, with modified therapy programming on the weekends.  Your rehabilitation program will include the following services:  Physical Therapy (PT), Occupational Therapy (OT), Speech Therapy (ST), 24 hour per day rehabilitation nursing, Therapeutic Recreaction (TR), Case Management (Social Worker), Rehabilitation Medicine, Nutrition Services and Pharmacy Services  Weekly team conferences will be held on Wednesday to discuss your progress.  Your Social Worker will talk with you frequently to get your input and to update you on team discussions.  Team conferences with you and your family in attendance may also be held.  Expected length of stay: 10-14 days  Overall anticipated outcome: supervision with cueing  Depending on your progress and recovery, your program may change. Your Social Worker will coordinate services and will keep you informed of any changes. Your Social Worker's name and contact numbers are listed  below.  The following services may also be recommended but are not provided by the [facility] will be made to provide these services after discharge if needed.  Arrangements include referral to agencies that provide these services.  Your insurance has been verified to be:  Medicare & Commerical Your primary doctor is:    Pertinent information will be shared with your doctor and your insurance company.  Social  Worker:  Ovidio Kin, [place] or ([phone]  Information discussed with and copy given to patient by: Elease Hashimoto, 08/31/2016, 10:03 AM

## 2016-08-31 NOTE — Progress Notes (Signed)
Subjective/Complaints: Patient placed on oxygen over the weekend. Denies shortness of breath currently. However, in therapy has some drops in O2 saturation. Review of systems denies chest pain, denies lower extremity swelling, no abdominal pain, no nausea, vomiting, diarrhea Objective: Vital Signs: Blood pressure (!) 128/58, pulse 65, temperature 98.4 F (36.9 C), temperature source Oral, resp. rate 18, weight 76.2 kg (168 lb), SpO2 95 %. No results found. Results for orders placed or performed during the hospital encounter of 08/28/16 (from the past 72 hour(s))  CBC WITH DIFFERENTIAL     Status: Abnormal   Collection Time: 08/29/16  5:16 AM  Result Value Ref Range   WBC 9.6 4.0 - 10.5 K/uL   RBC 3.11 (L) 3.87 - 5.11 MIL/uL   Hemoglobin 9.8 (L) 12.0 - 15.0 g/dL   HCT 29.8 (L) 36.0 - 46.0 %   MCV 95.8 78.0 - 100.0 fL   MCH 31.5 26.0 - 34.0 pg   MCHC 32.9 30.0 - 36.0 g/dL   RDW 14.1 11.5 - 15.5 %   Platelets 235 150 - 400 K/uL   Neutrophils Relative % 73 %   Neutro Abs 6.9 1.7 - 7.7 K/uL   Lymphocytes Relative 11 %   Lymphs Abs 1.1 0.7 - 4.0 K/uL   Monocytes Relative 13 %   Monocytes Absolute 1.3 (H) 0.1 - 1.0 K/uL   Eosinophils Relative 3 %   Eosinophils Absolute 0.3 0.0 - 0.7 K/uL   Basophils Relative 0 %   Basophils Absolute 0.0 0.0 - 0.1 K/uL  Comprehensive metabolic panel     Status: Abnormal   Collection Time: 08/29/16  5:16 AM  Result Value Ref Range   Sodium 135 135 - 145 mmol/L   Potassium 3.9 3.5 - 5.1 mmol/L   Chloride 100 (L) 101 - 111 mmol/L   CO2 27 22 - 32 mmol/L   Glucose, Bld 131 (H) 65 - 99 mg/dL   BUN 8 6 - 20 mg/dL   Creatinine, Ser 0.64 0.44 - 1.00 mg/dL   Calcium 7.9 (L) 8.9 - 10.3 mg/dL   Total Protein 4.9 (L) 6.5 - 8.1 g/dL   Albumin 2.1 (L) 3.5 - 5.0 g/dL   AST 39 15 - 41 U/L   ALT 22 14 - 54 U/L   Alkaline Phosphatase 85 38 - 126 U/L   Total Bilirubin 0.8 0.3 - 1.2 mg/dL   GFR calc non Af Amer >60 >60 mL/min   GFR calc Af Amer >60 >60 mL/min     Comment: (NOTE) The eGFR has been calculated using the CKD EPI equation. This calculation has not been validated in all clinical situations. eGFR's persistently <60 mL/min signify possible Chronic Kidney Disease.    Anion gap 8 5 - 15  Magnesium     Status: Abnormal   Collection Time: 08/29/16  5:16 AM  Result Value Ref Range   Magnesium 1.6 (L) 1.7 - 2.4 mg/dL  Magnesium     Status: None   Collection Time: 08/30/16  6:35 AM  Result Value Ref Range   Magnesium 1.7 1.7 - 2.4 mg/dL  Magnesium     Status: None   Collection Time: 08/31/16  5:43 AM  Result Value Ref Range   Magnesium 1.7 1.7 - 2.4 mg/dL     HEENT: normal, negative JVD Cardio: RRR and No murmur Resp: CTA B/L and Unlabored breathing, decreased breath sounds at both bases GI: BS positive and Nontender, nondistended Extremity:  Pulses positive and No Edema Skin:   Intact  and Other No evidence of bruising, erythema or drainage at IV site Neuro: Alert/Oriented, Normal Sensory, Abnormal Motor Motor strength is 3 plus left deltoid, biceps, triceps, grip, hip flexor, knee extensor, ankle dorsiflexor5/5 on the right side. and Abnormal FMC Ataxic/ dec [facility] Musc/Skel:  Other No pain with active range of motion of both upper and lower limbs Gen. no acute distress   Assessment/Plan: 1. 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 Physiatrist is providing close team supervision and 24 hour management of active medical problems listed below. Physiatrist and rehab team continue to assess barriers to discharge/monitor patient progress toward functional and medical goals. FIM: Function - Bathing Body parts bathed by patient: Right arm, Left arm, Chest, Abdomen, Front perineal area Body parts bathed by helper: Buttocks, Right upper leg, Left upper leg, Right lower leg, Left lower leg,  Back Assist Level:  (mod A)  Function- Upper Body Dressing/Undressing What is the patient wearing?: Pull over shirt/dress Pull over shirt/dress - Perfomed by patient: Thread/unthread right sleeve, Thread/unthread left sleeve, Pull shirt over trunk Pull over shirt/dress - Perfomed by helper: Put head through opening Assist Level: Touching or steadying assistance(Pt > 75%) Function - Lower Body Dressing/Undressing What is the patient wearing?: Non-skid slipper socks, Underwear Underwear - Performed by patient: Pull underwear up/down Non-skid slipper socks- Performed by helper: Don/doff right sock, Don/doff left sock Assist for footwear: Maximal assist Assist for lower body dressing:  (mod assist)  Function - Toileting Toileting steps completed by patient: Performs perineal hygiene, Adjust clothing prior to toileting Toileting steps completed by helper: Adjust clothing after toileting Toileting Assistive Devices: Grab bar or rail Assist level: Touching or steadying assistance (Pt.75%)  Function - Air cabin crew transfer assistive device: Bedside commode Assist level to toilet: Touching or steadying assistance (Pt > 75%) Assist level from toilet: Touching or steadying assistance (Pt > 75%) Assist level to bedside commode (at bedside): Touching or steadying assistance (Pt > 75%) Assist level from bedside commode (at bedside): Touching or steadying assistance (Pt > 75%)  Function - Chair/bed transfer Chair/bed transfer method: Stand pivot Chair/bed transfer assist level: Touching or steadying assistance (Pt > 75%) Chair/bed transfer assistive device: Armrests, Bedrails  Function - Locomotion: Wheelchair Will patient use wheelchair at discharge?: Yes Type: Manual Max wheelchair distance: 41f  Assist Level: Touching or steadying assistance (Pt > 75%) Assist Level: Touching or steadying assistance (Pt > 75%) Wheel 150 feet activity did not occur: Safety/medical concerns Turns  around,maneuvers to table,bed, and toilet,negotiates 3% grade,maneuvers on rugs and over doorsills: No Function - Locomotion: Ambulation Assistive device: No device, Hand held assist Max distance: 15f Assist level: Touching or steadying assistance (Pt > 75%) Assist level: Touching or steadying assistance (Pt > 75%) Walk 50 feet with 2 turns activity did not occur: Safety/medical concerns Walk 150 feet activity did not occur: Safety/medical concerns Walk 10 feet on uneven surfaces activity did not occur: Safety/medical concerns  Function - Comprehension Comprehension: Auditory Comprehension assist level: Follows complex conversation/direction with no assist  Function - Expression Expression: Verbal Expression assist level: Expresses complex ideas: With no assist  Function - Social Interaction Social Interaction assist level: Interacts appropriately with others - No medications needed.  Function - Problem Solving Problem solving assist level: Solves complex problems: Recognizes & self-corrects  Function - Memory Memory assist level: Complete Independence: No helper Patient normally able to recall (first  3 days only): Current season, Location of own room, Staff names and faces, That he or she is in a hospital  Medical Problem List and Plan: 1. Functional and mobility deficitssecondary to embolic infarcts involving right MCA and inferior right cerebellum -CIR PT, OT, speech  2. DVT Prophylaxis/Anticoagulation: Mechanical: Sequential compression devices, below knee Bilateral lower extremities, eliquis 3. Pain Management: tylenol for mild pain and tramadol for more severe pain 4. Mood: team to provide ego supports -monitor while here. Pt seems to be in reasonable spirits -SW to screen patient 5. Neuropsych: This patient iscapable of making decisions on herown behalf. 6. Skin/Wound Care: loca care to wounds, elevate LUE for edema  control -encourage adequate nutrition 7. Fluids/Electrolytes/Nutrition: Encourage PO -Labs during admission to follow up fluids/elecrolytes 8. A Fib: rate controlled at present. Monitor as we begin mobilizing patient more. -amiodarone for rate control -eliquis for anticoagulation -wean oxygen as able, did not use home O2 9. HTN: Monitor bp's with increasing mobility. Diastolic elevated today -continue dyazide daily- 10. LUE embolectomy: limb warm, still edematous -continue to elevate -ACE wrap if needed -eliquis 11. Hypoxemia, chest x-ray with some evidence of fluid overload, echocardiogram showed aortic stenosis but normal systolic and diastolic function. No evidence of infection. We'll repeat Lasix and monitor electrolytes as well as clinically. Her weights have been stable at 76 kg of last couple days LOS (Days) 3 A FACE TO FACE EVALUATION WAS PERFORMED  KIRSTEINS,ANDREW E 08/31/2016, 8:34 AM

## 2016-08-31 NOTE — Progress Notes (Signed)
Physical Therapy Note  Patient Details  Name: Christine Pugh MRN: [id number] Date of Birth: 17-Jan-1922 Today's Date: 08/31/2016    Time: 1115-1130 15 minutes  1:1 No c/o pain. Pt c/o fatigue from previous sessions.  Pt asks to stay in bed, willing to perform sitting balance and sit to stands with encouragement. Pt performed bed mobility, sitting balance and sit to stand x 10 with RW with supervision.  SpO2 > 95% throughout on room air. Pt unable to complete full session due to fatigue.    DONAWERTH,KAREN 08/31/2016, 11:31 AM

## 2016-08-31 NOTE — Progress Notes (Signed)
Resting without complaint. Using BSC, dribbles urine when ambulating to BR. Wearing pad. Congested NP cough. Pitting edema to BLE's. LUE discolored with 2 + radial pulse. O2 @ 2L/M. SOB with minimal activity. Christine Pugh

## 2016-08-31 NOTE — Progress Notes (Signed)
Patient information reviewed and entered into eRehab system by Melanny Wire, RN, CRRN, PPS Coordinator.  Information including medical coding and functional independence measure will be reviewed and updated through discharge.     Per nursing patient was given "Data Collection Information Summary for Patients in Inpatient Rehabilitation Facilities with attached "Privacy Act Statement-Health Care Records" upon admission.  

## 2016-09-01 ENCOUNTER — Inpatient Hospital Stay (HOSPITAL_COMMUNITY): Payer: Medicare Other | Admitting: Occupational Therapy

## 2016-09-01 ENCOUNTER — Encounter: Payer: Self-pay | Admitting: Internal Medicine

## 2016-09-01 ENCOUNTER — Inpatient Hospital Stay (HOSPITAL_COMMUNITY): Payer: Medicare Other | Admitting: Physical Therapy

## 2016-09-01 LAB — MAGNESIUM: Magnesium: 1.7 mg/dL (ref 1.7–2.4)

## 2016-09-01 NOTE — Progress Notes (Signed)
Physical Therapy Session Note  Patient Details  Name: Christine Pugh MRN: [id number] Date of Birth: [birth year]  Today's Date: 09/01/2016 PT Individual Time: 1000-1040 PT Individual Time Calculation (min): 40 min   Short Term Goals: Week 1:     Skilled Therapeutic Interventions/Progress Updates:    no c/o pain, but c/o fatigue following ADL session this AM.   Session focus on activity tolerance via nustep x10 minutes with 4 extremities at level 1, gait training x30' with RW and steady assist>close supervision, and stair negotiation x8 steps with 2 rails and steady assist. One short rest break at 5 minutes on nustep to assess O2/HR (97%/87bpm) before continuing.  Pt requires min cues during ambulation and stair negotiation for upright posture.  Pt requires several extended seated rest breaks throughout session and requesting to terminate session early 2/2 fatigue.  Discussed possibility of having shorter sessions throughout the day and pt would like to try this.  Pt also states "I've been active my whole life. I'm 89, I don't really care about being active anymore."  Pt returned to room at end of session and positioned upright in w/c with call bell in reach and needs met.  Missed 20 minutes of skilled PT due to fatigue.  Therapy Documentation Precautions:  Precautions Precautions: Fall Precaution Comments: watch sats Restrictions Weight Bearing Restrictions: No General: PT Amount of Missed Time (min): 20 Minutes PT Missed Treatment Reason: Patient fatigue   See Function Navigator for Current Functional Status.   Therapy/Group: Individual Therapy  [name] E Penven-Crew 09/01/2016, 10:42 AM

## 2016-09-01 NOTE — Progress Notes (Signed)
Occupational Therapy Session Note  Patient Details  Name: Christine Pugh MRN: [id number] Date of Birth: 22-Mar-1922  Today's Date: 09/01/2016 OT Individual Time: 1333-1500 OT Individual Time Calculation (min): 87 min    Short Term Goals: Week 1:  OT Short Term Goal 1 (Week 1): STGs=LTGs  Skilled Therapeutic Interventions/Progress Updates:    Pt worked on wheelchair mobility to the [employer] with min assist to start session in order to work on UE endurance and use.  Once in the therapy gym had pt focus on standing balance at the high/low table while engaged in completing a jig saw puzzle.  She was able to stand for intervals of 13 mins, 7 mins, 8 mins, 10 mins and 7 mins while completing puzzle.  Rest breaks of 5 mins were needed in between intervals of standing.  Discussed expectations of progress and discharge plan.  Pt states she needs to get in contact with the nurse that is going to assist her at discharge to find out how long she will be available during the day.  Pt expects to be alone at night and will likely need to get up for toileting.  Discussed possible use of a 3:1 at bedside instead of walking to the bathroom.  Pt taken back to the room via wheelchair with therapist assist to conclude session.  Pt left up in chair with call button and phone in reach.     Therapy Documentation Precautions:  Precautions Precautions: Fall Precaution Comments: watch sats Restrictions Weight Bearing Restrictions: No   Vital Signs: Therapy Vitals Pulse Rate: 68 Oxygen Therapy SpO2: 90 % O2 Device: Not Delivered Pain: Pain Assessment Pain Assessment: No/denies pain ADL: See Function Navigator for Current Functional Status.   Therapy/Group: Individual Therapy  Adaleena Mooers OTR/L 09/01/2016, 3:34 PM

## 2016-09-01 NOTE — Progress Notes (Signed)
Occupational Therapy Session Note  Patient Details  Name: Christine Pugh MRN: [id number] Date of Birth: [birth year]  Today's Date: 09/01/2016 OT Individual Time: 0902-1001 OT Individual Time Calculation (min): 59 min    Short Term Goals: Week 1:  OT Short Term Goal 1 (Week 1): STGs=LTGs  Skilled Therapeutic Interventions/Progress Updates:    Pt completed shower, toileting, and dressing during session.  Mod instructional cueing for use of the RW for safety.  She was able to gather her clothing with the RW prior to transfer to the shower.  She completed shower with supervision and min instructional cueing for thoroughness.  Dressing sit to stand on the shower seat with supervision as well.  She was able to stand at the sink briefly for less than 2 mins to complete brushing her hair with supervision.  Sat in wheelchair for therapist to assist with donning TEDs and then she was able to donn her gripper socks.  Finished session with ambulation to the elevated toilet with supervision and use of the RW for support.  Pt able to complete all aspects of toileting with supervision.  She transferred out to her wheelchair after washing her hands at the sink and was left with call button and phone in reach in preparation for next session.    Therapy Documentation Precautions:  Precautions Precautions: Fall Precaution Comments: watch sats Restrictions Weight Bearing Restrictions: No  Pain: Pain Assessment Pain Assessment: No/denies pain ADL: See Function Navigator for Current Functional Status.   Therapy/Group: Individual Therapy  Leotis Isham OTR/L 09/01/2016, 12:12 PM

## 2016-09-01 NOTE — Progress Notes (Signed)
Subjective/Complaints:   Patient is no longer on oxygen. He received IV Lasix times 1 yesterday  Asking whether cardiology is aware that she is here. Of note, that cardiology did perform consult on acute care on 08/25/2016  Patient states she's led a good life, does not wish to be resuscitated in the event of cardiopulmonary arrest.  She has concerns about numerous family members including son and daughter both of whom are fighting cancer Review of systems denies chest pain, denies lower extremity swelling, no abdominal pain, no nausea, vomiting, diarrhea Objective: Vital Signs: Blood pressure (!) 129/56, pulse 71, temperature 97.8 F (36.6 C), temperature source Oral, resp. rate 18, weight 76.2 kg (168 lb), SpO2 91 %. No results found. Results for orders placed or performed during the hospital encounter of 08/28/16 (from the past 72 hour(s))  Magnesium     Status: None   Collection Time: 08/30/16  6:35 AM  Result Value Ref Range   Magnesium 1.7 1.7 - 2.4 mg/dL  Magnesium     Status: None   Collection Time: 08/31/16  5:43 AM  Result Value Ref Range   Magnesium 1.7 1.7 - 2.4 mg/dL  Magnesium     Status: None   Collection Time: 09/01/16  7:19 AM  Result Value Ref Range   Magnesium 1.7 1.7 - 2.4 mg/dL     HEENT: normal, negative JVD Cardio: RRR and No murmur Resp: CTA B/L and Unlabored breathing, decreased breath sounds at both bases GI: BS positive and Nontender, nondistended Extremity:  Pulses positive and No Edema Skin:   Intact and Other No evidence of bruising, erythema or drainage at IV site Neuro: Alert/Oriented x3, Normal Sensory, Abnormal Motor Motor strength is 3 plus left deltoid, biceps, triceps, grip, hip flexor, knee extensor, ankle dorsiflexor5/5 on the right side. and Abnormal FMC Ataxic/ dec [facility] Musc/Skel:  Other No pain with active range of motion of both upper and lower limbs Gen. no acute distress   Assessment/Plan: 1. 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 Physiatrist is providing close team supervision and 24 hour management of active medical problems listed below. Physiatrist and rehab team continue to assess barriers to discharge/monitor patient progress toward functional and medical goals. FIM: Function - Bathing Position: Wheelchair/chair at sink Body parts bathed by patient: Right arm, Left arm, Abdomen, Chest, Front perineal area, Buttocks, Right upper leg, Left upper leg Body parts bathed by helper: Left lower leg, Right lower leg Assist Level: Touching or steadying assistance(Pt > 75%)  Function- Upper Body Dressing/Undressing What is the patient wearing?: Pull over shirt/dress Pull over shirt/dress - Perfomed by patient: Thread/unthread right sleeve, Thread/unthread left sleeve, Put head through opening Pull over shirt/dress - Perfomed by helper: Pull shirt over trunk Assist Level: Touching or steadying assistance(Pt > 75%) Function - Lower Body Dressing/Undressing What is the patient wearing?: Non-skid slipper socks, Ted Hose Underwear - Performed by patient: Pull underwear up/down Non-skid slipper socks- Performed by helper: Don/doff left sock, Don/doff right sock TED Hose - Performed by helper: Don/doff right TED hose, Don/doff left TED hose Assist for footwear: Maximal assist Assist for lower body dressing: Touching or steadying assistance (Pt > 75%)  Function - Toileting Toileting steps completed by patient: Adjust clothing prior to toileting, Performs perineal hygiene, Adjust clothing after toileting Toileting steps completed by helper: Adjust clothing after toileting Toileting Assistive Devices: Grab bar or rail Assist  level: Touching or steadying assistance (Pt.75%)  Function - Toilet Transfers Toilet transfer assistive device: Elevated toilet seat/BSC over toilet,  Walker, Grab bar Assist level to toilet: Touching or steadying assistance (Pt > 75%) Assist level from toilet: Touching or steadying assistance (Pt > 75%) Assist level to bedside commode (at bedside): Touching or steadying assistance (Pt > 75%) Assist level from bedside commode (at bedside): Touching or steadying assistance (Pt > 75%)  Function - Chair/bed transfer Chair/bed transfer method: Stand pivot Chair/bed transfer assist level: Touching or steadying assistance (Pt > 75%) Chair/bed transfer assistive device: Bedrails  Function - Locomotion: Wheelchair Will patient use wheelchair at discharge?: Yes Type: Manual Max wheelchair distance: 51ft  Assist Level: Touching or steadying assistance (Pt > 75%) Assist Level: Touching or steadying assistance (Pt > 75%) Wheel 150 feet activity did not occur: Safety/medical concerns Turns around,maneuvers to table,bed, and toilet,negotiates 3% grade,maneuvers on rugs and over doorsills: No Function - Locomotion: Ambulation Assistive device: No device, Hand held assist Max distance: 65ft  Assist level: Touching or steadying assistance (Pt > 75%) Assist level: Touching or steadying assistance (Pt > 75%) Walk 50 feet with 2 turns activity did not occur: Safety/medical concerns Walk 150 feet activity did not occur: Safety/medical concerns Walk 10 feet on uneven surfaces activity did not occur: Safety/medical concerns  Function - Comprehension Comprehension: Auditory Comprehension assist level: Follows complex conversation/direction with extra time/assistive device  Function - Expression Expression: Verbal Expression assist level: Expresses complex ideas: With extra time/assistive device  Function - Social Interaction Social Interaction assist level: Interacts appropriately with others with medication or extra time (anti-anxiety, antidepressant).  Function - Problem Solving Problem solving assist level: Solves complex 90% of the time/cues <  10% of the time  Function - Memory Memory assist level: Requires cues to use assistive device Patient normally able to recall (first 3 days only): Current season, Location of own room, Staff names and faces, That he or she is in a hospital  Medical Problem List and Plan: 1. Functional and mobility deficitssecondary to embolic infarcts involving right MCA and inferior right cerebellum -CIR PT, OT, speech, team conference in a.m.  2. DVT Prophylaxis/Anticoagulation: Mechanical: Sequential compression devices, below knee Bilateral lower extremities, eliquis 3. Pain Management: tylenol for mild pain and tramadol for more severe pain 4. Mood: team to provide ego supports -monitor while here. Pt seems to be in reasonable spirits -SW to screen patient 5. Neuropsych: This patient iscapable of making decisions on herown behalf. 6. Skin/Wound Care: loca care to wounds, elevate LUE for edema control -encourage adequate nutrition 7. Fluids/Electrolytes/Nutrition: Encourage PO -Labs during admission to follow up fluids/elecrolytes 8. A Fib: rate controlled at present. Monitor as we begin mobilizing patient more. -amiodarone for rate control -eliquis for anticoagulation, having small amount of blood with bowel movements. Will monitor CBC - 9. HTN: Monitor bp's with increasing mobility. Diastolic elevated today -continue dyazide daily- 10. LUE embolectomy: limb warm, still edematous but improving -continue to elevate -ACE wrap if needed -eliquis 11. Hypoxemia, improved 12. Advance directives, patient  has a DO NOT RESUSCITATE directive at home and would like to maintain this in the hospital setting. There has been written LOS (Days) 4 A FACE TO FACE EVALUATION WAS PERFORMED  Jamari Diana E 09/01/2016, 9:23 AM

## 2016-09-02 ENCOUNTER — Inpatient Hospital Stay (HOSPITAL_COMMUNITY): Payer: Medicare Other | Admitting: Physical Therapy

## 2016-09-02 ENCOUNTER — Inpatient Hospital Stay (HOSPITAL_COMMUNITY): Payer: Medicare Other | Admitting: Occupational Therapy

## 2016-09-02 DIAGNOSIS — G8194 Hemiplegia, unspecified affecting left nondominant side: Secondary | ICD-10-CM

## 2016-09-02 NOTE — Progress Notes (Signed)
Subjective/Complaints:  No issues overnight. Breathing okay maintaining sats of 95% during exercise Review of systems denies chest pain, denies lower extremity swelling, no abdominal pain, no nausea, vomiting, diarrhea Objective: Vital Signs: Blood pressure (!) 134/58, pulse 70, temperature 98 F (36.7 C), temperature source Oral, resp. rate 18, weight 70.4 kg (155 lb 4.8 oz), SpO2 98 %. No results found. Results for orders placed or performed during the hospital encounter of 08/28/16 (from the past 72 hour(s))  Magnesium     Status: None   Collection Time: 08/31/16  5:43 AM  Result Value Ref Range   Magnesium 1.7 1.7 - 2.4 mg/dL  Magnesium     Status: None   Collection Time: 09/01/16  7:19 AM  Result Value Ref Range   Magnesium 1.7 1.7 - 2.4 mg/dL     HEENT: normal, negative JVD Cardio: RRR and No murmur Resp: CTA B/L and Unlabored breathing, decreased breath sounds at both bases GI: BS positive and Nontender, nondistended Extremity:  Pulses positive and No Edema Skin:   Intact and Other No evidence of bruising, erythema or drainage at IV site, ecchymosis, left arm and forearm resolving Neuro: Alert/Oriented x3, Normal Sensory, Abnormal Motor Motor strength is 3 plus left deltoid, biceps, triceps, grip, hip flexor, knee extensor, ankle dorsiflexor5/5 on the right side. and Abnormal FMC Ataxic/ dec [facility] Musc/Skel:  Other No pain with active range of motion of both upper and lower limbs Gen. no acute distress   Assessment/Plan: 1. 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 Physiatrist is providing close team supervision and 24 hour management of active medical problems listed below. Physiatrist and rehab team continue to assess barriers to discharge/monitor patient progress toward functional and medical goals. FIM: Function -  Bathing Position: Shower Body parts bathed by patient: Right arm, Left arm, Front perineal area, Buttocks, Right upper leg Body parts bathed by helper: Back Bathing not applicable: Chest, Abdomen, Left upper leg, Right lower leg, Left lower leg, Back (Pt did not want to attempt this session.) Assist Level: Supervision or verbal cues  Function- Upper Body Dressing/Undressing What is the patient wearing?: Button up shirt Pull over shirt/dress - Perfomed by patient: Thread/unthread right sleeve, Thread/unthread left sleeve, Put head through opening (gown) Pull over shirt/dress - Perfomed by helper: Pull shirt over trunk Button up shirt - Perfomed by patient: Thread/unthread right sleeve, Thread/unthread left sleeve, Pull shirt around back, Button/unbutton shirt Assist Level: Supervision or verbal cues Function - Lower Body Dressing/Undressing What is the patient wearing?: Pants, Non-skid slipper socks, Ted Hose Position: Wheelchair/chair at [employer] - Performed by patient: Thread/unthread right underwear leg, Thread/unthread left underwear leg, Pull underwear up/down Pants- Performed by patient: Thread/unthread right pants leg, Thread/unthread left pants leg, Pull pants up/down Non-skid slipper socks- Performed by patient: Don/doff right sock, Don/doff left sock Non-skid slipper socks- Performed by helper: Don/doff left sock, Don/doff right sock TED Hose - Performed by helper: Don/doff right TED hose, Don/doff left TED hose Assist for footwear: Supervision/touching assist Assist for lower body dressing: Touching or steadying assistance (Pt > 75%)  Function - Toileting Toileting steps completed by patient: Adjust clothing prior to toileting, Adjust clothing after toileting, Performs perineal hygiene Toileting steps completed by helper: Performs perineal hygiene Toileting Assistive Devices: Grab bar or rail Assist level: Supervision or verbal cues  Function - Air cabin crew  transfer assistive device: Walker, Elevated toilet  seat/BSC over toilet, Bedside commode Assist level to toilet: Supervision or verbal cues Assist level from toilet: Supervision or verbal cues Assist level to bedside commode (at bedside): Touching or steadying assistance (Pt > 75%) Assist level from bedside commode (at bedside): Touching or steadying assistance (Pt > 75%)  Function - Chair/bed transfer Chair/bed transfer method: Stand pivot Chair/bed transfer assist level: Supervision or verbal cues Chair/bed transfer assistive device: Armrests, Walker  Function - Locomotion: Wheelchair Will patient use wheelchair at discharge?: Yes Type: Manual Max wheelchair distance: 74f  Assist Level: Touching or steadying assistance (Pt > 75%) Assist Level: Touching or steadying assistance (Pt > 75%) Wheel 150 feet activity did not occur: Safety/medical concerns Turns around,maneuvers to table,bed, and toilet,negotiates 3% grade,maneuvers on rugs and over doorsills: No Function - Locomotion: Ambulation Assistive device: Walker-rolling Max distance: 30 Assist level: Touching or steadying assistance (Pt > 75%) Assist level: Touching or steadying assistance (Pt > 75%) Walk 50 feet with 2 turns activity did not occur: Safety/medical concerns Walk 150 feet activity did not occur: Safety/medical concerns Walk 10 feet on uneven surfaces activity did not occur: Safety/medical concerns  Function - Comprehension Comprehension: Auditory Comprehension assist level: Follows complex conversation/direction with extra time/assistive device  Function - Expression Expression: Verbal Expression assist level: Expresses complex ideas: With extra time/assistive device  Function - Social Interaction Social Interaction assist level: Interacts appropriately with others with medication or extra time (anti-anxiety, antidepressant).  Function - Problem Solving Problem solving assist level: Solves complex problems:  With extra time  Function - Memory Memory assist level: More than reasonable amount of time Patient normally able to recall (first 3 days only): Current season, Location of own room, Staff names and faces, That he or she is in a hospital  Medical Problem List and Plan: 1. Functional and mobility deficitssecondary to embolic infarcts involving right MCA and inferior right cerebellum -CIR PT, OT, speech, Team conference today please see physician documentation under team conference tab, met with team face-to-face to discuss problems,progress, and goals. Formulized individual treatment plan based on medical history, underlying problem and comorbidities.  2. DVT Prophylaxis/Anticoagulation: Mechanical: Sequential compression devices, below knee Bilateral lower extremities, eliquis 3. Pain Management: tylenol for mild pain and tramadol for more severe pain 4. Mood: team to provide ego supports -monitor while here. Pt seems to be in reasonable spirits -SW to screen patient 5. Neuropsych: This patient iscapable of making decisions on herown behalf. 6. Skin/Wound Care: loca care to wounds, elevate LUE for edema control -encourage adequate nutrition 7. Fluids/Electrolytes/Nutrition: Encourage PO -Labs during admission to follow up fluids/elecrolytes 8. A Fib: rate controlled at present. Monitor as we begin mobilizing patient more. -amiodarone for rate control -eliquis for anticoagulation, having small amount of blood with bowel movements. Will monitor CBC, 1/27 Hgb 9.8, repeat today - 9. HTN: Monitor bp's with increasing mobility. Diastolic elevated today -continue dyazide daily- 10. LUE embolectomy: limb warm,  less ecchymosis, using both upper limbs for exercise today on NuStep -continue to elevate -ACE wrap if needed -eliquis 11. Hypoxemia,  improved 12. Advance directives, patient  has a DO NOT RESUSCITATE directive at home and would like to maintain this in the hospital setting.  LOS (Days) 5 A FACE TO FACE EVALUATION WAS PERFORMED  KIRSTEINS,ANDREW E 09/02/2016, 9:24 AM

## 2016-09-02 NOTE — Progress Notes (Signed)
Occupational Therapy Session Note  Patient Details  Name: HIBO FETTE MRN: [id number] Date of Birth: 1922-06-04  Today's Date: 09/02/2016 OT Individual Time: [id number] OT Individual Time Calculation (min): 44 min    Short Term Goals: Week 1:  OT Short Term Goal 1 (Week 1): STGs=LTGs  Skilled Therapeutic Interventions/Progress Updates:    Pt completed toileting, grooming, and dressing tasks during session.  She was able to transfer to the EOB and then to the toilet with use of the RW and supervision.  She was able to gather her clothing, towel, and washcloth with use of the RW and supervision as well.  Supervision for all aspects of toileting with transfer to the wheelchair at the sink once she finished.  She completed grooming tasks and washing of peri area and arms before dressing secondary to not wanting to take a complete bath.  Min instructional cueing for hand placement with transition stand to sit and for allowing for enough room to stand in front of the sink.  Pt left in wheelchair at end of session.   Therapy Documentation Precautions:  Precautions Precautions: Fall Precaution Comments: watch sats Restrictions Weight Bearing Restrictions: No  Pain: Pain Assessment Pain Assessment: No/denies pain ADL: See Function Navigator for Current Functional Status.   Therapy/Group: Individual Therapy  Wynter Isaacs OTR/L 09/02/2016, 8:44 AM

## 2016-09-02 NOTE — Progress Notes (Signed)
Physical Therapy Session Note  Patient Details  Name: Christine Pugh MRN: [id number] Date of Birth: [birth year]  Today's Date: 09/02/2016 PT Concurrent Time: 0905-0945 PT Concurrent Time Calculation (min): 40 min  AND PT Individual Time: 4037-0964 PT Individual Time Calculation: 45 min   Short Term Goals: Week 1:    STG =LTG due ELOS   Skilled Therapeutic Interventions/Progress Updates:      Pt received sitting in WC and agreeable to PT. Pt transferred patient to rehab gym in [facility].  Gait in rehab gym x 22f with RW and supervision assist for safety. Prolonged rest break    Nustep endurance training x 8 min with one rest break To be assessed by MD. Pt rates Borg RPE 15/20 at end of endurance activity   Variable gait training With RW including gait over uneven surface x 10 ft and to step over 2, 1 inch canes. Min assist overall with min cues for AD management.   [facility] and all needs met.    Throughout treatment pt's SpO2 remained >94% on room air and HR remained <85bpm  Session2 Pt received supine in bed and agreeable to PT. Supine>sit transfer without assist or cues    Gait in room to BR for bladder evacuation. Distant supervision assist from PT for toilet transfer. Standing balance at sink with distant supervision from PT to wash hands.   Gait in rehab gym x 665fwith RW and supervision assist from PT or safety prolonged rest break following gait.   Car transfer with supervision assist from PT to small SUV height with BUE support on RW. Cues for technique and UE placement for safety as well as proper technique to sit first for safety   WC mobility in hall of hospital x 12558fith supervision assist from PT with encouragement for increased distance.    Patient returned too room and left semirecumbent in bed with call bell in reach and all needs met.     Therapy Documentation Precautions:   Precautions Precautions: Fall Precaution Comments: watch sats Restrictions Weight Bearing Restrictions: No  Pain: Pain Assessment Pain Assessment: No/denies pain   See Function Navigator for Current Functional Status.   Therapy/Group: Individual Therapy  AusLorie Phenix31/2018, 10:46 AM

## 2016-09-02 NOTE — Progress Notes (Signed)
Physical Therapy Session Note  Patient Details  Name: Christine Pugh MRN: [id number] Date of Birth: Apr 26, 1922  Today's Date: 09/02/2016 PT Individual Time: 1130-1200 PT Individual Time Calculation (min): 30 min   Short Term Goals: Week 1:     Skilled Therapeutic Interventions/Progress Updates:    no c/o pain.  Session focus on activity tolerance via nustep x10 minutes with 4 extremities.  Pt lethargic throughout session, requiring cues to stay alert on nustep.  Pt returned to room at end of session and returned to bed with close supervision.  Positioned to comfort with call bell in reach and needs met.    Therapy Documentation Precautions:  Precautions Precautions: Fall Precaution Comments: watch sats Restrictions Weight Bearing Restrictions: No   See Function Navigator for Current Functional Status.   Therapy/Group: Individual Therapy  Earnest Conroy Penven-Crew 09/02/2016, 12:35 PM

## 2016-09-02 NOTE — Progress Notes (Signed)
Social Work Patient ID: Christine Pugh, female   DOB: [birth year], 83 y.o.   MRN: [id number]  Met with pt and son who was here to discuss team conference goals supervision level and discharge 2/3. Pt has hired someone to be with during the day and will be alone at night time, team feels she is safe to do this. Son will be checking on her frequently at home also. Agreeable to equipment and follow up Therapies. Will work toward discharge Sat.

## 2016-09-02 NOTE — Progress Notes (Signed)
Occupational Therapy Session Note  Patient Details  Name: Christine Pugh MRN: [id number] Date of Birth: 1922-02-08  Today's Date: 09/02/2016 OT Individual Time: 1500-1530 OT Individual Time Calculation (min): 30 min    Short Term Goals: Week 1:  OT Short Term Goal 1 (Week 1): STGs=LTGs  Skilled Therapeutic Interventions/Progress Updates:    Pt began session with transfer to the toilet with use of the RW and supervision.  She was able to manage clothing and complete hygiene with supervision sit to stand.  Once she completed toileting she washed her hands at the sink and transferred to the wheelchair.  Therapist took pt to the therapy gym and up to the UE ergonometer for endurance building and UE strengthening.  She was able to complete 3 sets of 3 mins on level 8 resistance.  RPMs maintained at level 24-25 with 2 sets peddling posteriorly and 1 set anteriorly.  Pt needed 2-3 min rest break between sets with oxygen sats at 96% and HR around 70-74.  Pt returned to room and left up in chair to complete session.    Therapy Documentation Precautions:  Precautions Precautions: Fall Precaution Comments: watch sats Restrictions Weight Bearing Restrictions: No  Pain: Pain Assessment Pain Assessment: No/denies pain ADL: See Function Navigator for Current Functional Status.   Therapy/Group: Individual Therapy  London Tarnowski OTR/L 09/02/2016, 4:46 PM

## 2016-09-03 ENCOUNTER — Inpatient Hospital Stay (HOSPITAL_COMMUNITY): Payer: Medicare Other | Admitting: Occupational Therapy

## 2016-09-03 ENCOUNTER — Inpatient Hospital Stay (HOSPITAL_COMMUNITY): Payer: Medicare Other

## 2016-09-03 ENCOUNTER — Inpatient Hospital Stay (HOSPITAL_COMMUNITY): Payer: Medicare Other | Admitting: Physical Therapy

## 2016-09-03 DIAGNOSIS — K649 Unspecified hemorrhoids: Secondary | ICD-10-CM | POA: Diagnosis present

## 2016-09-03 LAB — CBC
HEMATOCRIT: 35.4 % — AB (ref 36.0–46.0)
Hemoglobin: 11.6 g/dL — ABNORMAL LOW (ref 12.0–15.0)
MCH: 31.6 pg (ref 26.0–34.0)
MCHC: 32.8 g/dL (ref 30.0–36.0)
MCV: 96.5 fL (ref 78.0–100.0)
PLATELETS: 416 10*3/uL — AB (ref 150–400)
RBC: 3.67 MIL/uL — ABNORMAL LOW (ref 3.87–5.11)
RDW: 13.9 % (ref 11.5–15.5)
WBC: 9 10*3/uL (ref 4.0–10.5)

## 2016-09-03 MED ORDER — HYDROCORTISONE ACETATE 25 MG RE SUPP
25.0000 mg | Freq: Two times a day (BID) | RECTAL | Status: DC
  Administered 2016-09-03 – 2016-09-05 (×3): 25 mg via RECTAL
  Filled 2016-09-03 (×4): qty 1

## 2016-09-03 NOTE — Progress Notes (Signed)
Occupational Therapy Note  Patient Details  Name: Christine Pugh MRN: [id number] Date of Birth: [birth year]  Today's Date: 09/03/2016 OT Individual Time: 1100-1130 OT Individual Time Calculation (min): 30 min   Pt denied pain Individual Therapy  Pt resting in w/c upon arrival.  Pt transitioned to gym and engaged in NuStep therex (3 X 4 mins at workload 4) with rest breaks in between sets.  Pt noted with SOB but O2 sats>95% on RA.  Upon return to room, pt requested to use toilet.  Pt amb with RW and completed all tasks at supervision level before returning to w/c/. Pt remained in w/c with all needs within reach.    Leotis Shames [facility] 09/03/2016, 12:24 PM

## 2016-09-03 NOTE — Progress Notes (Signed)
Physical Therapy Session Note  Patient Details  Name: Christine Pugh MRN: [id number] Date of Birth: 09/03/1921  Today's Date: 09/03/2016 PT Individual Time: 1000-1045 AND 7125-2712 PT Individual Time Calculation (min): 45 min AND 72mn   Short Term Goals: Week 1:  PT Short Term Goal 1 (Week 1): STG=LTG due to ELOS  Skilled Therapeutic Interventions/Progress Updates:      Pt received supine in bed and agreeable to PT. Supine>sit transfer without assist or cues   Gait in room with RW and supervision [facility]. Distant supervision assist from PT for safety for toilet transfer  Standing balance with distant supervision from Pt to wash hands   Gait with RW x 915fwith supervision assist from PT as well as min cues for AD management. SpO2 assessed following Gait at 97%  PT instructed patient in stair negotiation for ascent and descent of x 12 steps (3"+6") with rest break after ascent. Min cues for posture. Patient self selected step to gait pattern on 6 inch step and step over step on 3 inch step.   Pt instructed in Level A [place] HEP with hand out provided.   Session 2:   Gait in room to BR for urination with RW and distant supervision assist .   Standing balance training with foot tap on 4 inch step 2 UE progressing to no UE support with min assist throughout. Standing on wedge while performing fine motor task of Clothes pin tree. Min assist from PT and no UE support, as well as Min cues for improved anterior weight shift to prevent posterior LOB.   Patient returned to room and left supine in bed with call bell in reach and all needs met. Transfer to bed completed without cues or assist from PT        Therapy Documentation Precautions:  Precautions Precautions: Fall Precaution Comments: watch sats Restrictions Weight Bearing Restrictions: No Vital Signs: Therapy Vitals BP: 104/66 Patient Position (if appropriate):  Sitting Pain: Pain Assessment Pain Assessment: No/denies pain   See Function Navigator for Current Functional Status.   Therapy/Group: Individual Therapy  AuLorie Phenix/08/2016, 10:41 AM

## 2016-09-03 NOTE — Progress Notes (Signed)
Occupational Therapy Session Note  Patient Details  Name: JANETT HAMEISTER MRN: [id number] Date of Birth: [birth year]  Today's Date: 09/03/2016 OT Individual Time: [id number] OT Individual Time Calculation (min): 44 min    Short Term Goals: Week 1:  OT Short Term Goal 1 (Week 1): STGs=LTGs  Skilled Therapeutic Interventions/Progress Updates:    Pt completed short distance mobility without assistive device but with hand held assist with min assist overall.  Pt only able to complete approximately 20 steps before needing to rest secondary to her stating her LEs were feeling weak.  Pt's son present at start of session and was inquiring how long she may need to use the walker and if she would be able to get back to her PLOF.  Based on pt's current age, balance reactions, history of arthritic knees, and new CVA, feel pt will likely be dependent on use of RW for a while.  He voiced understanding.  Therapist rolled pt down to the ADL kitchen for continued education on walker usage for meal/snack prep.  She was able to gather items in the kitchen and transport them back to their appropriate location using the counter tops and a walker bag that was issued.  She completed this in 2 intervals.  Once with therapist supervision and the other with modified independence.  Returned to room at end of session with pt left in wheelchair with call button and phone in reach.    Therapy Documentation Precautions:  Precautions Precautions: Fall Precaution Comments: watch sats Restrictions Weight Bearing Restrictions: No  Pain: Pain Assessment Pain Assessment: No/denies pain ADL: See Function Navigator for Current Functional Status.   Therapy/Group: Individual Therapy  Abcde Oneil OTR/L 09/03/2016, 3:28 PM

## 2016-09-03 NOTE — Patient Care Conference (Signed)
Inpatient RehabilitationTeam Conference and Plan of Care Update Date: 09/02/2016   Time: 10:55 AM    Patient Name: Christine Pugh      Medical Record Number: [id number]  Date of Birth: 07-06-1922 Sex: Female         Room/Bed: 4W01C/4W01C-01 Payor Info: Payor: MEDICARE / Plan: MEDICARE PART A AND B / Product Type: *No Product type* /    Admitting Diagnosis: CVA  Admit Date/Time:  08/28/2016  3:13 PM Admission Comments: No comment available   Primary Diagnosis:  Embolic stroke involving middle cerebral artery (HCC) Principal Problem: Embolic stroke involving middle cerebral artery [facility])  Patient Active Problem List   Diagnosis Date Noted  . Embolic stroke involving middle cerebral artery ([place]) 08/28/2016  . Embolism of right middle cerebral artery 08/24/2016  . Thromboembolic stroke ([place]) [id number]  . Ischemia of extremity 08/23/2016  . Syncope 01/06/2016  . Atypical atrial flutter ([place]) 10/28/2015  . Essential hypertension 10/28/2015  . Aortic valve disorder 05/19/2009  . SVT/ PSVT/ PAT 05/19/2009    Expected Discharge Date: Expected Discharge Date: 09/05/16  Team Members Present: Physician leading conference: Dr. Alysia Penna Social Worker Present: Ovidio Kin, LCSW Nurse Present: Dorien Chihuahua, RN PT Present: Barrie Folk, PT OT Present: Clyda Greener, OT SLP Present: Gunnar Fusi, SLP PPS Coordinator present : Daiva Nakayama, RN, CRRN     Current Status/Progress Goal Weekly Team Focus  Medical   urinary incont, some cchronic, supervision ADLs, no DNR  avoid pulmonary edema, fluid management  manage fluid overload   Bowel/Bladder   pt mostly continent of b/b with "dribbling" of urine on way to BR and/or stool urgency. last bm 1/29, colace daily (wears depends)  continent b/b with min assist   monitor b/b status q shift and prn   Swallow/Nutrition/ Hydration             ADL's   supervision to min guard assist for all selfcare tasks, toileting, shower transfers  with use of DME.  Decreased endurance, needs multiple rest breaks during session.  supervision overall  selfcare retraining, balance retraining, transfer retraining, neuromuscular re-education, pt/family education   Mobility   Supervision assist with gait and transfers. Patient able to ambulate up to 5ft due to Cardioreaspiratory deficits.   Mod I transfers and bed mobility. Supervision assist with at household distances  Endurance. strengthening. safter with gait and transfers.    Communication             Safety/Cognition/ Behavioral Observations            Pain   pt does not c/o pain, denies pain  maintain pain 0  monitor q shift and prn.   Skin   pt has had c/o "itching" and states the brief she wears causes her itching.  Hydrocortisone cream ordered. also left arm echymosis from embolectemy, swelling is improved, 1+ now. bruises easily,pt was on heparin drip, now eliquis.  maintain skin integrity, no breakdown while on rehab  monitor q shift and prn, admin comfort measures as needed,       *See Care Plan and progress notes for long and short-term goals.  Barriers to Discharge: poor endurance    Possible Resolutions to Barriers:  Cont rehab, D/C planning    Discharge Planning/Teaching Needs:  Home plans to hire caregiver during the day does not feel she will need someone at night to watch her sleep. See if she will reach this goal and will be safe for her at home.  Team Discussion:  Goals of supervision level-doing well. Cont of B & B-wore depends at home due to urgency. Arm less swollen from clot removal. Some cueing at times for safety. Team feels will be fine at night have caregiver during the daytime. SP signed off.  Revisions to Treatment Plan:  DC 2/3   Continued Need for Acute Rehabilitation Level of Care: The patient requires daily medical management by a physician with specialized training in physical medicine and rehabilitation for the following conditions: Daily  direction of a multidisciplinary physical rehabilitation program to ensure safe treatment while eliciting the highest outcome that is of practical value to the patient.: Yes Daily medical management of patient stability for increased activity during participation in an intensive rehabilitation regime.: Yes Daily analysis of laboratory values and/or radiology reports with any subsequent need for medication adjustment of medical intervention for : Neurological problems;Mood/behavior problems  Tron Flythe, [place]yme 09/03/2016, 11:12 AM

## 2016-09-03 NOTE — Progress Notes (Signed)
Social Work Christine Hashimoto, LCSW Social Worker Signed   Patient Care Conference Date of Service: 09/03/2016 10:00 AM      Hide copied text Hover for attribution information Inpatient RehabilitationTeam Conference and Plan of Care Update Date: 09/02/2016   Time: 10:55 AM      Patient Name: Christine Pugh      Medical Record Number: [id number]  Date of Birth: Jul 28, 1922 Sex: Female         Room/Bed: 4W01C/4W01C-01 Payor Info: Payor: MEDICARE / Plan: MEDICARE PART A AND B / Product Type: *No Product type* /     Admitting Diagnosis: CVA  Admit Date/Time:  08/28/2016  3:13 PM Admission Comments: No comment available    Primary Diagnosis:  Embolic stroke involving middle cerebral artery ([place]) Principal Problem: Embolic stroke involving middle cerebral artery [facility])       Patient Active Problem List    Diagnosis Date Noted  . Embolic stroke involving middle cerebral artery ([place]) 08/28/2016  . Embolism of right middle cerebral artery 08/24/2016  . Thromboembolic stroke ([place]) [id number]  . Ischemia of extremity 08/23/2016  . Syncope 01/06/2016  . Atypical atrial flutter ([place]) 10/28/2015  . Essential hypertension 10/28/2015  . Aortic valve disorder 05/19/2009  . SVT/ PSVT/ PAT 05/19/2009      Expected Discharge Date: Expected Discharge Date: 09/05/16   Team Members Present: Physician leading conference: Dr. Alysia Penna Social Worker Present: Ovidio Kin, LCSW Nurse Present: Dorien Chihuahua, RN PT Present: Barrie Folk, PT OT Present: Clyda Greener, OT SLP Present: Gunnar Fusi, SLP PPS Coordinator present : Daiva Nakayama, RN, CRRN       Current Status/Progress Goal Weekly Team Focus  Medical     urinary incont, some cchronic, supervision ADLs, no DNR  avoid pulmonary edema, fluid management  manage fluid overload   Bowel/Bladder     pt mostly continent of b/b with "dribbling" of urine on way to BR and/or stool urgency. last bm 1/29, colace daily (wears depends)  continent  b/b with min assist   monitor b/b status q shift and prn   Swallow/Nutrition/ Hydration               ADL's     supervision to min guard assist for all selfcare tasks, toileting, shower transfers with use of DME.  Decreased endurance, needs multiple rest breaks during session.  supervision overall  selfcare retraining, balance retraining, transfer retraining, neuromuscular re-education, pt/family education   Mobility     Supervision assist with gait and transfers. Patient able to ambulate up to 39ft due to Cardioreaspiratory deficits.   Mod I transfers and bed mobility. Supervision assist with at household distances  Endurance. strengthening. safter with gait and transfers.    Communication               Safety/Cognition/ Behavioral Observations             Pain     pt does not c/o pain, denies pain  maintain pain 0  monitor q shift and prn.   Skin     pt has had c/o "itching" and states the brief she wears causes her itching.  Hydrocortisone cream ordered. also left arm echymosis from embolectemy, swelling is improved, 1+ now. bruises easily,pt was on heparin drip, now eliquis.  maintain skin integrity, no breakdown while on rehab  monitor q shift and prn, admin comfort measures as needed,      *See Care Plan and progress notes for long and short-term goals.  Barriers to Discharge: poor endurance     Possible Resolutions to Barriers:  Cont rehab, D/C planning     Discharge Planning/Teaching Needs:  Home plans to hire caregiver during the day does not feel she will need someone at night to watch her sleep. See if she will reach this goal and will be safe for her at home.      Team Discussion:  Goals of supervision level-doing well. Cont of B & B-wore depends at home due to urgency. Arm less swollen from clot removal. Some cueing at times for safety. Team feels will be fine at night have caregiver during the daytime. SP signed off.  Revisions to Treatment Plan:  DC 2/3      Continued Need for Acute Rehabilitation Level of Care: The patient requires daily medical management by a physician with specialized training in physical medicine and rehabilitation for the following conditions: Daily direction of a multidisciplinary physical rehabilitation program to ensure safe treatment while eliciting the highest outcome that is of practical value to the patient.: Yes Daily medical management of patient stability for increased activity during participation in an intensive rehabilitation regime.: Yes Daily analysis of laboratory values and/or radiology reports with any subsequent need for medication adjustment of medical intervention for : Neurological problems;Mood/behavior problems   Christine Pugh 09/03/2016, 11:12 AM       Patient ID: Christine Pugh, female   DOB: 1922-02-20, 83 y.o.   MRN: [id number]

## 2016-09-03 NOTE — Progress Notes (Signed)
Occupational Therapy Session Note  Patient Details  Name: Christine Pugh MRN: [id number] Date of Birth: Jul 13, 1922  Today's Date: 09/03/2016 OT Individual Time: [id number] OT Individual Time Calculation (min): 40 min    Short Term Goals: Week 1:  OT Short Term Goal 1 (Week 1): STGs=LTGs  Skilled Therapeutic Interventions/Progress Updates:    Pt worked on self care skills to simulate the same set up she will be using for home.  Pt sat up from flat bed, no rails independently. Ambulated with RW to toilet and shower with S. Cues needed to scoot back fully on bench as she was sitting too close to edge.  S bathing, after shower pt sitting on edge of bench to don slipper on L foot leaning to L. Close LOB but demonstrated good reaction time to correct balance.  Ambulated to bed to don clothing with set up/ supervision. Stood at sink to cleanse dentures. Pt fatigues after standing just a minute or two, breathing more heavily. Pt discussed that she has a faulty heart valve that is causing this. Pt resting in supine with IV team with pt.   Therapy Documentation Precautions:  Precautions Precautions: Fall Precaution Comments: watch sats Restrictions Weight Bearing Restrictions: No Therapy Vitals BP: 104/66 Patient Position (if appropriate): Sitting Pain: Pain Assessment Pain Assessment: No/denies pain ADL:    See Function Navigator for Current Functional Status.   Therapy/Group: Individual Therapy  [place] 09/03/2016, 9:15 AM

## 2016-09-03 NOTE — Discharge Summary (Signed)
Physician Discharge Summary  Patient ID: Christine Pugh MRN: [id number] DOB/AGE: 81-Oct-1923 81 y.o.  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 [facility])   Discharged Condition: stable   Significant Diagnostic Studies: N/A   Labs:  Basic Metabolic Panel:  Recent Labs Lab 09/01/16 0719 09/04/16 0523  NA  --  137  K  --  3.6  CL  --  100*  CO2  --  26  GLUCOSE  --  91  BUN  --  23*  CREATININE  --  0.89  CALCIUM  --  8.8*  MG 1.7  --     CBC: CBC Latest Ref Rng & Units 09/03/2016 08/29/2016 08/28/2016  WBC 4.0 - 10.5 K/uL 9.0 9.6 10.2  Hemoglobin 12.0 - 15.0 g/dL 11.6(L) 9.8(L) 9.5(L)  Hematocrit 36.0 - 46.0 % 35.4(L) 29.8(L) 29.3(L)  Platelets 150 - 400 K/uL 416(H) 235 195    CBG: No results for input(s): GLUCAP in the last 168 hours.   Brief HPI:   Christine Pugh a 83 y.o.femalewith a h/o A fib/A flutter--refused anticoagulation, aortic stenosis, HTN, piror CVA who was admitted on 08/23/16 with acute onset of LUE paralysis. CT at [facility] revealed subacute L-cerebellar stroke and CTA LUE showed occlusion of distal left axillary artery approximately 5 cm with distal reconstitution of brachial artery at mid proximal humerus. Dr. Oneida Alar consulted and patient taken to OR for Left brachial, radial, ulnar, axillary embolectomy the same day. Post procedure patient noted to have left facial droop, rightward gaze, slurred speech and left hemiparesis with CTA revealing R-MCA embolus. She underwent thrombectomy with revascularization of R-MCA with restoration of TICI flow. She was extubated without difficulty on 1/22 and maintained on IV heparin. 2D echo with EF 65-70% with mild concentric LVH and  vigorous systolic function. Dr Leonie Man felt stroke embolic due to A fib and patient continues on low dose ASA. Dr. Rayann Heman consulted for input and recommended Eliquis once cleared by neurology. Patient with resultant balance deficits, poor awareness of deficits, dyspnea with activity and left visual field deficits. Therapy ongoing and CIR recommended for follow up therapy.    Hospital Course: Christine Pugh was admitted to rehab 08/28/2016 for inpatient therapies to consist of PT and OT at least three hours five days a week. Past admission physiatrist, therapy team and rehab RN have worked together to provide customized collaborative inpatient rehab. LUE edema has resolved with minimal ecchymosis. She continue on Eliquis and serial H/H is showing improvement in Hgb. She has had some rectal bleeding due to hemorrhoidal irritation and Anusol suppositories and colace was added to help with symptoms. Heart rate has been controlled and respiratory status has gradually improved. Follow up check of lytes shows evidence of dehydration and patient was advaised to increase fluid intake to help maintain adequate hydration.  Blood pressure and heart rate have been controlled and no symptoms reported with increase in activity level.   She has progressed to supervision level and will continue to receive follow up [place] [place] [facility] [place] [facility] [place] after discharge.    Rehab course: During patient's stay in rehab weekly team conferences were held to monitor patient's progress, set goals and discuss barriers to discharge. At admission, patient  required min assist with mobility and basic self care tasks.  She has had improvement in activity tolerance, balance, postural control, as well as ability to compensate for deficits. She is has had improvement in functional use LUE  and LLE as well as improved awareness. She is able to complete ADL tasks with supervision. She is modified independent for transfers and is  ambulating 90 feet with supervision and use of RW. She requires min cues for management of AD. Family education not completed as family unable to provide assistance. Patient has hired experienced caregiver who will assist after discharge.     Disposition: Home   Diet: Heart Healthy. Low salt.   Special Instructions: 1.  HHRN to draw BMET/CBC 09/08/16 with results to Dr. Olena Heckle.  2. Need to increase fluid intake to help improve renal status.    Discharge Instructions    Ambulatory referral to Physical Medicine Rehab    Complete by:  As directed    1-2 week transitional care      Allergies as of 09/05/2016   No Known Allergies     Medication List    STOP taking these medications   diphenhydrAMINE 25 mg capsule Commonly known as:  BENADRYL   traMADol 50 MG tablet Commonly known as:  ULTRAM     TAKE these medications   amiodarone 100 MG tablet Commonly known as:  PACERONE Take 0.5 tablets (50 mg total) by mouth daily.   apixaban 5 MG Tabs tablet Commonly known as:  ELIQUIS Take 1 tablet (5 mg total) by mouth 2 (two) times daily.   atorvastatin 20 MG tablet Commonly known as:  LIPITOR Take 1 tablet (20 mg total) by mouth daily at 6 PM.   cyproheptadine 4 MG tablet Commonly known as:  PERIACTIN Take 4 mg by mouth 3 (three) times daily as needed for allergies. Reported on 08/27/2015   docusate sodium 100 MG capsule Commonly known as:  COLACE Take 1 capsule (100 mg total) by mouth daily.   hydrocortisone 25 MG suppository Commonly known as:  ANUSOL-HC Place 1 suppository (25 mg total) rectally 2 (two) times daily.   levothyroxine 112 MCG tablet Commonly known as:  SYNTHROID, LEVOTHROID Take 1 tablet (112 mcg total) by mouth daily before breakfast. What changed:  medication strength   Lutein-Zeaxanthin 25-5 MG Caps Take 1 capsule by mouth 2 (two) times daily.   multivitamin tablet Take 1 tablet by mouth daily.   pantoprazole 40 MG tablet Commonly known as:   PROTONIX Take 1 tablet (40 mg total) by mouth daily.   triamcinolone cream 0.1 % Commonly known as:  KENALOG Apply 1 application topically daily as needed (irritated skin).   triamterene-hydrochlorothiazide 37.5-25 MG capsule Commonly known as:  DYAZIDE Take 1 each (1 capsule total) by mouth daily.      Follow-up Information    Gar Ponto, MD Follow up on 09/11/2016.   Specialty:  Family Medicine Why:  Appointment @ 12:15 pm Contact information: [place] [address] [phone]        [name], MD Follow up.   Specialty:  Physical Medicine and Rehabilitation Why:  office will call you with follow up appointment Contact information: [place] [address] [phone]        Antony Contras, MD. Call.   Specialties:  Neurology, Radiology Why:  for follow up appointment in 3-4 weeks Contact information: [address] [place] [address] [phone]  SignedBary Leriche 09/07/2016, 8:29 AM

## 2016-09-03 NOTE — Plan of Care (Signed)
Problem: RH Toileting Goal: LTG Patient will perform toileting w/assist, cues/equip (OT) LTG: Patient will perform toiletiing (clothes management/hygiene) with assist, with/without cues using equipment (OT)  Goals upgraded based on progress.

## 2016-09-04 ENCOUNTER — Inpatient Hospital Stay (HOSPITAL_COMMUNITY): Payer: Medicare Other | Admitting: Occupational Therapy

## 2016-09-04 ENCOUNTER — Telehealth: Payer: Self-pay | Admitting: Cardiology

## 2016-09-04 ENCOUNTER — Inpatient Hospital Stay (HOSPITAL_COMMUNITY): Payer: Medicare Other | Admitting: Physical Therapy

## 2016-09-04 DIAGNOSIS — R269 Unspecified abnormalities of gait and mobility: Secondary | ICD-10-CM

## 2016-09-04 DIAGNOSIS — I359 Nonrheumatic aortic valve disorder, unspecified: Secondary | ICD-10-CM

## 2016-09-04 DIAGNOSIS — I69398 Other sequelae of cerebral infarction: Secondary | ICD-10-CM

## 2016-09-04 LAB — BASIC METABOLIC PANEL
Anion gap: 11 (ref 5–15)
BUN: 23 mg/dL — AB (ref 6–20)
CHLORIDE: 100 mmol/L — AB (ref 101–111)
CO2: 26 mmol/L (ref 22–32)
CREATININE: 0.89 mg/dL (ref 0.44–1.00)
Calcium: 8.8 mg/dL — ABNORMAL LOW (ref 8.9–10.3)
GFR calc Af Amer: >60 mL/min (ref >60)
GFR calc non Af Amer: 54 mL/min — ABNORMAL LOW (ref >60)
GLUCOSE: 91 mg/dL (ref 65–99)
Potassium: 3.6 mmol/L (ref 3.5–5.1)
Sodium: 137 mmol/L (ref 135–145)

## 2016-09-04 MED ORDER — ATORVASTATIN CALCIUM 20 MG PO TABS: 20.0000 mg | ORAL_TABLET | Freq: Every day | ORAL | 0 refills | Status: DC

## 2016-09-04 MED ORDER — PANTOPRAZOLE SODIUM 40 MG PO TBEC: 40.0000 mg | DELAYED_RELEASE_TABLET | Freq: Every day | ORAL | 0 refills | Status: DC

## 2016-09-04 MED ORDER — AMIODARONE HCL 100 MG PO TABS: 50.0000 mg | ORAL_TABLET | Freq: Every day | ORAL | 0 refills | Status: DC

## 2016-09-04 MED ORDER — LEVOTHYROXINE SODIUM 112 MCG PO TABS: 112.0000 ug | ORAL_TABLET | Freq: Every day | ORAL | 0 refills | Status: DC

## 2016-09-04 MED ORDER — HYDROCORTISONE ACETATE 25 MG RE SUPP: 25.0000 mg | Freq: Two times a day (BID) | RECTAL | 0 refills | Status: DC

## 2016-09-04 MED ORDER — DOCUSATE SODIUM 100 MG PO CAPS: 100.0000 mg | ORAL_CAPSULE | Freq: Every day | ORAL | 0 refills | Status: DC

## 2016-09-04 MED ORDER — TRIAMTERENE-HCTZ 37.5-25 MG PO CAPS: 1.0000 | ORAL_CAPSULE | Freq: Every day | ORAL | 0 refills | Status: DC

## 2016-09-04 MED ORDER — APIXABAN 5 MG PO TABS: 5.0000 mg | ORAL_TABLET | Freq: Two times a day (BID) | ORAL | 0 refills | Status: DC

## 2016-09-04 MED ORDER — LEVOTHYROXINE SODIUM 137 MCG PO TABS: 112.0000 ug | ORAL_TABLET | Freq: Every day | ORAL | 0 refills | Status: DC

## 2016-09-04 NOTE — Progress Notes (Signed)
Occupational Therapy Session Note  Patient Details  Name: Christine Pugh MRN: [id number] Date of Birth: [birth year]  Today's Date: 09/04/2016 OT Individual Time: 0800-0830 OT Individual Time Calculation (min): 30 min    Short Term Goals: Week 1:  OT Short Term Goal 1 (Week 1): STGs=LTGs  Skilled Therapeutic Interventions/Progress Updates: 1:1 self care retraining at sink leve (pt's choice). Pt ambulated with RW with supervision to bathroom to perform toileting and toiiet transfers - also with supervision. Pt able to demonstrate dynamic standing balance with supervision (distant) while perfroming washing buttocks and peri area. PT completed bathing and dressing at sink with setup. Pt does required extra time to organize herself but able complete all tasks without  Cuing. Left in w/c writing a family email on the [place].     Therapy Documentation Precautions:  Precautions Precautions: Fall Precaution Comments: watch sats Restrictions Weight Bearing Restrictions: No General:   Vital Signs: Therapy Vitals Temp: 98.2 F (36.8 C) Temp Source: Oral Pulse Rate: 77 Resp: 18 BP: (!) 112/59 Patient Position (if appropriate): Lying Oxygen Therapy SpO2: 97 % O2 Device: Not Delivered Pain:     See Function Navigator for Current Functional Status.   Therapy/Group: Individual Therapy  Willeen Cass [facility] 09/04/2016, 8:14 AM

## 2016-09-04 NOTE — Telephone Encounter (Signed)
Pt requesting we send in 90 day supply of Eliquis 5 mg bid to [facility] (says she is getting 30 day supply from rehab after admission to [facility]) pt was not previously on Eliquis per LOV - pt says she is being released from rehab tomorrow - f/u appt with Dr. Domenic Polite in March - will route if ok to send Eliquis

## 2016-09-04 NOTE — Progress Notes (Signed)
Physical Therapy Discharge Summary  Patient Details  Name: Christine Pugh MRN: [id number] Date of Birth: 1922/06/26  Today's Date: 09/04/2016 PT Individual Time: 0901-1000 PT Individual Time Calculation (min): 59 min    Patient has met 11 of 11 long term goals due to improved activity tolerance, improved balance, improved postural control and increased strength.  Patient to discharge at an ambulatory level Supervision.   Patient's care partner is independent to provide the necessary physical assistance at discharge.  Reasons goals not met: All treatment goals met.   Recommendation:  Patient will benefit from ongoing skilled PT services in home health setting to continue to advance safe functional mobility, address ongoing impairments in endurance, balance, strength, gait and minimize fall risk.  Equipment: RW  Reasons for discharge: treatment goals met and discharge from hospital  Patient/family agrees with progress made and goals achieved: Yes   PT Treatment:  PT instructed patient in grad day assessment to assess progress towards goals; see below for results. Pt educated in basic transfers, gait across various surfaces, car transfer, bed mobility, stairs , and discharge recommendations for supervision assist throughout day and use of RW for all mobility. Patient returned to room and left sitting in [facility] with call bell in reach and all needs met.      PT Discharge Precautions/Restrictions Precautions Precautions: Fall Restrictions Weight Bearing Restrictions: No Vital Signs  Pain Pain Assessment Pain Assessment: No/denies pain Pain Score: 0-No pain Vision/Perception    WFL Cognition Orientation Level: Oriented X4 Sustained Attention: Appears intact Selective Attention: Appears intact Memory: Appears intact Awareness: Appears intact Problem Solving: Appears intact Safety/Judgment: Appears intact Sensation Sensation Light Touch: Appears Intact Proprioception: Appears  Intact Coordination Gross Motor Movements are Fluid and Coordinated: Yes Fine Motor Movements are Fluid and Coordinated: Yes Finger Nose Finger Test: WFL.  Motor  Motor Motor: Other (comment) Motor - Discharge Observations: Generalized deconditioning  Mobility Bed Mobility Bed Mobility: Rolling Right;Rolling Left;Supine to Sit;Sit to Supine Rolling Right: 6: Modified independent (Device/Increase time) Rolling Left: 6: Modified independent (Device/Increase time) Supine to Sit: 6: Modified independent (Device/Increase time) Sit to Supine: 6: Modified independent (Device/Increase time) Transfers Transfers: Yes Sit to Stand: 6: Modified independent (Device/Increase time) (with RW) Stand to Sit: 6: Modified independent (Device/Increase time) (with RW) Stand Pivot Transfers: 6: Modified independent (Device/Increase time) (with RW) Locomotion  Ambulation Ambulation: Yes Ambulation/Gait Assistance: 5: Supervision Ambulation Distance (Feet): 100 Feet Assistive device: Rolling walker Ambulation/Gait Assistance Details: Verbal cues for precautions/safety Gait Gait: Yes Gait Pattern: Impaired Gait Pattern: Step-through pattern;Decreased stance time - left Stairs / Additional Locomotion Stairs: Yes Stairs Assistance: 5: Supervision Stairs Assistance Details: Verbal cues for precautions/safety;Verbal cues for gait pattern Stair Management Technique: Two rails Number of Stairs: 12 Height of Stairs: 6 (and 3) Product manager Mobility: Yes Wheelchair Assistance: 5: Investment banker, operational Details: Verbal cues for Marketing executive: Both upper extremities Wheelchair Parts Management: Supervision/cueing Distance: 169f  Trunk/Postural Assessment  Cervical Assessment Cervical Assessment: Exceptions to [facility](forward head) Thoracic Assessment Thoracic Assessment: Exceptions to [facility]) Lumbar Assessment Lumbar Assessment: Exceptions  to [facility](Posterior pelvic tilt) Postural Control Postural Control: Within Functional Limits  Balance Balance Balance Assessed: Yes Static Sitting Balance Static Sitting - Level of Assistance: 6: Modified independent (Device/Increase time) Dynamic Sitting Balance Dynamic Sitting - Level of Assistance: 6: Modified independent (Device/Increase time) Static Standing Balance Static Standing - Balance Support: No upper extremity supported Static Standing - Level of Assistance: 6: Modified independent (Device/Increase time)  Dynamic Standing Balance Dynamic Standing - Level of Assistance: 5: Stand by assistance Dynamic Standing - Balance Activities: Forward lean/weight shifting Extremity Assessment      RLE Assessment RLE Assessment: Exceptions to [facility] RLE AROM (degrees) RLE Overall AROM Comments: WFL RLE Strength RLE Overall Strength Comments: Hip flexion 4/5. all others tested 4+/5.  LLE Assessment LLE Assessment: Exceptions to WFL LLE AROM (degrees) LLE Overall AROM Comments: WFL LLE Strength LLE Overall Strength Comments: Hip flexion 4/5. all others tested 4+/5.    See Function Navigator for Current Functional Status.  Lorie Phenix 09/04/2016, 10:02 AM

## 2016-09-04 NOTE — Progress Notes (Signed)
Occupational Therapy Session Note  Patient Details  Name: Christine Pugh MRN: [id number] Date of Birth: 1921-10-30  Today's Date: 09/04/2016 OT Individual Time: 1115-1155 OT Individual Time Calculation (min): 40 min    Short Term Goals: Week 1:  OT Short Term Goal 1 (Week 1): STGs=LTGs      Skilled Therapeutic Interventions/Progress Updates:    Pt seen this session to facilitate functional mobility for tub transfers and general activity tolerance. Pt received in w/c stating that she felt very fatigued. She was agreeable to working on tub transfers in tub room that is set up like her home. Pt taken via w/c to tub room and then she used RW 10 ft to walk into bathroom. With no cues whatsoever, she demonstrated safe mechanics sitting down, transferring legs in and out and sit to stand to get out of tub. Pt agreeable to try to walk 50 ft towards gym. Pt stood up slowly, ambulated into hallway. Therapist gave pt instructions to turn to left 5x and pt was turning R ignoring instructions. Pt had a glazed look and another therapist brought her chair up for her to sit down in.  Pt stated " I totally spaced out". Checked O2 levels WNL. Pt may have had a drop in blood pressure as it runs very low. Used this as an Engineer, water for her to let her family/ caregivers know that this can happen at times.  Pt taken back to her room and transferred into bed. Worked on AROM arm exercises for the last 10 min of the session. Pt in room with all needs met.  Therapy Documentation Precautions:  Precautions Precautions: Fall Precaution Comments: watch sats Restrictions Weight Bearing Restrictions: No  Pain: Pain Assessment Pain Assessment: No/denies pain Pain Score: 0-No pain ADL:  See Function Navigator for Current Functional Status.   Therapy/Group: Individual Therapy  SAGUIER,JULIA 09/04/2016, 11:08 AM

## 2016-09-04 NOTE — Telephone Encounter (Signed)
Going to be released from rehab today  Has questions about her medications that they are sending her home with

## 2016-09-04 NOTE — Progress Notes (Signed)
Subjective/Complaints:  Remains off O2. Has questions regarding discharge medications, which we have discussed.  Review of systems denies chest pain, denies lower extremity swelling, no abdominal pain, no nausea, vomiting, diarrhea Objective: Vital Signs: Blood pressure (!) 112/59, pulse 77, temperature 98.2 F (36.8 C), temperature source Oral, resp. rate 18, height '5\' 3"'$  (1.6 m), weight 70.4 kg (155 lb 4.8 oz), SpO2 97 %. No results found. Results for orders placed or performed during the hospital encounter of 08/28/16 (from the past 72 hour(s))  CBC     Status: Abnormal   Collection Time: 09/03/16  9:27 AM  Result Value Ref Range   WBC 9.0 4.0 - 10.5 K/uL   RBC 3.67 (L) 3.87 - 5.11 MIL/uL   Hemoglobin 11.6 (L) 12.0 - 15.0 g/dL   HCT 35.4 (L) 36.0 - 46.0 %   MCV 96.5 78.0 - 100.0 fL   MCH 31.6 26.0 - 34.0 pg   MCHC 32.8 30.0 - 36.0 g/dL   RDW 13.9 11.5 - 15.5 %   Platelets 416 (H) 150 - 400 K/uL  Basic metabolic panel     Status: Abnormal   Collection Time: 09/04/16  5:23 AM  Result Value Ref Range   Sodium 137 135 - 145 mmol/L   Potassium 3.6 3.5 - 5.1 mmol/L   Chloride 100 (L) 101 - 111 mmol/L   CO2 26 22 - 32 mmol/L   Glucose, Bld 91 65 - 99 mg/dL   BUN 23 (H) 6 - 20 mg/dL   Creatinine, Ser 0.89 0.44 - 1.00 mg/dL   Calcium 8.8 (L) 8.9 - 10.3 mg/dL   GFR calc non Af Amer 54 (L) >60 mL/min   GFR calc Af Amer >60 >60 mL/min    Comment: (NOTE) The eGFR has been calculated using the CKD EPI equation. This calculation has not been validated in all clinical situations. eGFR's persistently <60 mL/min signify possible Chronic Kidney Disease.    Anion gap 11 5 - 15     HEENT: normal, negative JVD Cardio: RRR and No murmur Resp: CTA B/L and Unlabored breathing, decreased breath sounds at both bases GI: BS positive and Nontender, nondistended Extremity:  Pulses positive and No Edema Skin:   Intact and Other No evidence of bruising, erythema or drainage at IV site,  ecchymosis, left arm and forearm resolving Neuro: Alert/Oriented x3, Normal Sensory, Abnormal Motor Motor strength is 3 plus left deltoid, biceps, triceps, grip, hip flexor, knee extensor, ankle dorsiflexor5/5 on the right side. and Abnormal [facility] Ataxic/ dec [facility] Musc/Skel:  Other No pain with active range of motion of both upper and lower limbs Gen. no acute distress   Assessment/Plan: 1. 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 Physiatrist is providing close team supervision and 24 hour management of active medical problems listed below. Physiatrist and rehab team continue to assess barriers to discharge/monitor patient progress toward functional and medical goals. FIM: Function - Bathing Position: Wheelchair/chair at sink Body parts bathed by patient: Right arm, Left arm, Front perineal area, Buttocks, Right upper leg, Left upper leg, Right lower leg, Left lower leg, Chest, Abdomen Body parts bathed by helper: Back Bathing not applicable: Chest, Abdomen, Left upper leg, Right lower leg, Left lower leg, Back (Pt did not want to attempt this session.) Assist Level: Supervision or verbal cues  Function- Upper Body Dressing/Undressing What is the patient wearing?: Pull over shirt/dress Pull  over shirt/dress - Perfomed by patient: Thread/unthread right sleeve, Thread/unthread left sleeve, Put head through opening, Pull shirt over trunk Pull over shirt/dress - Perfomed by helper: Pull shirt over trunk Button up shirt - Perfomed by patient: Thread/unthread right sleeve, Thread/unthread left sleeve, Pull shirt around back, Button/unbutton shirt (zipper) Assist Level: More than reasonable time Function - Lower Body Dressing/Undressing What is the patient wearing?: Underwear, Socks, Shoes Position: Sitting EOB Underwear - Performed by patient:  Thread/unthread right underwear leg, Thread/unthread left underwear leg, Pull underwear up/down Pants- Performed by patient: Thread/unthread right pants leg, Thread/unthread left pants leg, Pull pants up/down Non-skid slipper socks- Performed by patient: Don/doff right sock, Don/doff left sock Non-skid slipper socks- Performed by helper: Don/doff left sock, Don/doff right sock Socks - Performed by patient: Don/doff right sock, Don/doff left sock Shoes - Performed by patient: Don/doff right shoe, Don/doff left shoe, Fasten left, Fasten right TED Hose - Performed by helper:  (pt stated TEDs were giving her a rash, no MD order for them) Assist for footwear: Independent Assist for lower body dressing: More than reasonable time  Function - Toileting Toileting steps completed by patient: Adjust clothing prior to toileting, Adjust clothing after toileting, Performs perineal hygiene Toileting steps completed by helper: Performs perineal hygiene Toileting Assistive Devices: Grab bar or rail Assist level: More than reasonable time  Function - Air cabin crew transfer assistive device: Grab bar, Walker Assist level to toilet: No Help, no cues, assistive device, takes more than a reasonable amount of time Assist level from toilet: No Help, no cues, assistive device, takes more than a reasonable amount of time Assist level to bedside commode (at bedside): Touching or steadying assistance (Pt > 75%) Assist level from bedside commode (at bedside): Touching or steadying assistance (Pt > 75%)  Function - Chair/bed transfer Chair/bed transfer method: Stand pivot Chair/bed transfer assist level: Supervision or verbal cues Chair/bed transfer assistive device: Printmaker - Locomotion: Wheelchair Will patient use wheelchair at discharge?: Yes Type: Manual Max wheelchair distance: 17f  Assist Level: Supervision or verbal cues Assist Level: Supervision or verbal cues Wheel 150 feet activity  did not occur: Safety/medical concerns Turns around,maneuvers to table,bed, and toilet,negotiates 3% grade,maneuvers on rugs and over doorsills: No Function - Locomotion: Ambulation Assistive device: Walker-rolling Max distance: 910f Assist level: Supervision or verbal cues Assist level: Supervision or verbal cues Walk 50 feet with 2 turns activity did not occur: Safety/medical concerns Assist level: Supervision or verbal cues Walk 150 feet activity did not occur: Safety/medical concerns Walk 10 feet on uneven surfaces activity did not occur: Safety/medical concerns  Function - Comprehension Comprehension: Auditory Comprehension assist level: Follows complex conversation/direction with extra time/assistive device  Function - Expression Expression: Verbal Expression assist level: Expresses complex ideas: With extra time/assistive device  Function - Social Interaction Social Interaction assist level: Interacts appropriately with others with medication or extra time (anti-anxiety, antidepressant).  Function - Problem Solving Problem solving assist level: Solves complex problems: With extra time  Function - Memory Memory assist level: More than reasonable amount of time Patient normally able to recall (first 3 days only): Current season, Location of own room, Staff names and faces, That he or she is in a hospital  Medical Problem List and Plan: 1. Functional and mobility deficitssecondary to embolic infarcts involving right MCA and inferior right cerebellum -CIR PT, OT, speech, plan discharge for tomorrow 2. DVT Prophylaxis/Anticoagulation: Mechanical: Sequential compression devices, below knee Bilateral lower extremities, eliquis 3. Pain Management: tylenol for  mild pain and tramadol for more severe pain 4. Mood: team to provide ego supports -monitor while here. Pt seems to be in reasonable spirits -SW to screen patient 5. Neuropsych: This  patient iscapable of making decisions on herown behalf. 6. Skin/Wound Care: , elevate LUE for edema control -encourage adequate nutrition 7. Fluids/Electrolytes/Nutrition: Encourage PO -Labs during admission to follow up fluids/elecrolytes 8. A Fib: rate controlled at present. Monitor as we begin mobilizing patient more. -amiodarone for rate control -eliquis for anticoagulation, Hgb up at 11.6- 9. HTN: Monitor bp's with increasing mobility. Diastolic elevated today -continue dyazide daily- 10. LUE embolectomy: limb warm,  -continue to elevate -ACE wrap if needed -eliquis 11. Hypoxemia, resolved 12. Advance directives, patient  has a DO NOT RESUSCITATE directive at home and would like to maintain this in the hospital setting.  LOS (Days) 7 A FACE TO FACE EVALUATION WAS PERFORMED  Christine Pugh E 09/04/2016, 8:48 AM

## 2016-09-04 NOTE — Progress Notes (Signed)
Occupational Therapy Discharge Summary  Patient Details  Name: Christine Pugh MRN: [id number] Date of Birth: [birth year]  Today's Date: 09/04/2016 OT Individual Time: 1300-1355 OT Individual Time Calculation (min): 55 min   Session Note:  Pt ambulated to the toilet with the RW and modified independence.  She was able to ambulate short distances up the hallway X 2 with use of the RW and modified independence as well to further work on endurance.  Had her push her wheelchair with BUEs for part of session with modified independence and rest breaks X1 before reaching the therapy gym.  Once in the gym worked on UE exercises and strengthening activities using the 2Lb dowel rod.  Pt completed 1 set of 15 repetitions for shoulder flexion, forward chest press in sitting, and elbow flexion, all with rest breaks of 2-3 mins in between.  Completed task of hitting beach ball back to therapist using the dowel rod as well for 6-10 repetitions before needing to sit and rest.  Completed session with transfer back to the wheelchair and return to the room.  Pt left in wheelchair with call button and phone in reach.    Patient has met 8 of 8 long term goals due to improved activity tolerance, improved balance and ability to compensate for deficits.  Patient to discharge at overall Supervision level.  Patient's care partner unavailable to provide the necessary physical assistance at discharge.  No family education was completed, but pt will have assist during the day from hired caregiver who is experienced in nursing care and has helped her out in the past  Reasons goals not met: NA  Recommendation:  Patient will benefit from ongoing skilled OT services in home health setting to continue to advance functional skills in the area of BADL.  Pt is supervision for selfcare tasks sit to stand and modified independent for toileting tasks.  Still with limited endurance during performance of ADLS, requiring frequent rest breaks.   She also continues to demonstrate decreased dynamic standing balance as well requiring use of the RW for support.  Feel she will benefit from continued home health OT to further progress ADL function to possible modified independent level for everything.   Equipment: No equipment provided  Reasons for discharge: treatment goals met and discharge from hospital  Patient/family agrees with progress made and goals achieved: Yes  OT Discharge Precautions/Restrictions  Precautions Precautions: Fall Precaution Comments: watch sats Restrictions Weight Bearing Restrictions: No  Pain Pain Assessment Pain Assessment: No/denies pain ADL  See Function Section of chart for details  Vision/Perception  Vision- History Baseline Vision/History: No visual deficits Patient Visual Report: No change from baseline Vision- Assessment Vision Assessment?: No apparent visual deficits  Cognition Overall Cognitive Status: Within Functional Limits for tasks assessed Arousal/Alertness: Awake/alert Orientation Level: Oriented X4 Attention: Selective Sustained Attention: Appears intact Selective Attention: Appears intact Memory: Impaired Memory Impairment: Decreased recall of new information;Decreased short term memory Awareness: Appears intact Problem Solving: Appears intact Safety/Judgment: Appears intact Sensation Sensation Light Touch: Appears Intact Stereognosis: Appears Intact Hot/Cold: Appears Intact Proprioception: Appears Intact Coordination Gross Motor Movements are Fluid and Coordinated: Yes Fine Motor Movements are Fluid and Coordinated: Yes Finger Nose Finger Test: WFL.  Motor  Motor Motor - Discharge Observations: Generalized deconditioning, flexed posture in standing Mobility  Bed Mobility Rolling Right: 6: Modified independent (Device/Increase time) Rolling Left: 6: Modified independent (Device/Increase time) Supine to Sit: 6: Modified independent (Device/Increase time) Sit  to Supine: 6: Modified independent (Device/Increase time) Transfers Transfers:  Sit to Stand;Stand to Sit Sit to Stand: 6: Modified independent (Device/Increase time);Without upper extremity assist;From chair/3-in-1;With upper extremity assist Stand to Sit: 6: Modified independent (Device/Increase time);With armrests;With upper extremity assist;To chair/3-in-1  Trunk/Postural Assessment  Cervical Assessment Cervical Assessment: Exceptions to [facility] (cervical protraction) Thoracic Assessment Thoracic Assessment: Exceptions to [facility] (thoracic kyphosis) Lumbar Assessment Lumbar Assessment: Exceptions to [facility] (increased lumbar flexion in sitting and standing)  Balance Balance Balance Assessed: Yes Static Sitting Balance Static Sitting - Balance Support: No upper extremity supported Static Sitting - Level of Assistance: 6: Modified independent (Device/Increase time) Dynamic Sitting Balance Dynamic Sitting - Balance Support: During functional activity Dynamic Sitting - Level of Assistance: 6: Modified independent (Device/Increase time) Static Standing Balance Static Standing - Balance Support: No upper extremity supported Static Standing - Level of Assistance: 6: Modified independent (Device/Increase time) Dynamic Standing Balance Dynamic Standing - Balance Support: During functional activity;Right upper extremity supported Dynamic Standing - Level of Assistance: 5: Stand by assistance Extremity/Trunk Assessment RUE Assessment RUE Assessment: Within Functional Limits LUE Assessment LUE Assessment: Within Functional Limits   See Function Navigator for Current Functional Status.  Christine Pugh OTR/L 09/04/2016, 4:17 PM

## 2016-09-04 NOTE — Progress Notes (Signed)
Social Work  Discharge Note  The overall goal for the admission was met for:   Discharge location: Yes-HOME WITH HIRED CAREGIVER DURING THE DAY. SON WILL ALSO CHECK ON DAILY  Length of Stay: Yes-8 DAYS  Discharge activity level: Yes-MOD/I-SOME SUPERVISION-HOME MANAGEMENT AND TUB TRANSFERS  Home/community participation: Yes  Services provided included: MD, RD, PT, OT, SLP, RN, CM, Pharmacy and SW  Financial Services: Medicare and Private Insurance: [employer]  Follow-up services arranged: Home Health: [facility]-PT,OT, RN and Patient/Family has no preference for HH/DME agencies  Comments (or additional information):PT HAS HIRED A CAREGIVER DURING THE DAY FOR HER CARE. TEAM FEELS SHE WILL BE SAFE AT NIGHT ALONE. DID VERY WELL HERE AND SON WAS HERE TO SEE IN THERAPIES. READY TO GO HOME SAT. HAS ALL NEEDED EQUIPMENT  Patient/Family verbalized understanding of follow-up arrangements: Yes  Individual responsible for coordination of the follow-up plan: SELF & WALTER-SON  Confirmed correct DME delivered: Elease Hashimoto 09/04/2016    Elease Hashimoto

## 2016-09-05 NOTE — Progress Notes (Signed)
Has discharge instructions.  Denies having any questions.  Morning medications given and reviewed.  Denies having any questions at this time.  Discharged to s/o via wheelchair with all personal belongings.  Escorted to private car and assisted into car without incident by NT.

## 2016-09-05 NOTE — Progress Notes (Signed)
Subjective/Complaints:  No issues overnight, family coming in around noon  Review of systems denies chest pain, denies lower extremity swelling, no abdominal pain, no nausea, vomiting, diarrhea Objective: Vital Signs: Blood pressure 134/71, pulse 70, temperature 97.9 F (36.6 C), temperature source Oral, resp. rate 18, height '5\' 3"'$  (1.6 m), weight 70.4 kg (155 lb 4.8 oz), SpO2 94 %. No results found. Results for orders placed or performed during the hospital encounter of 08/28/16 (from the past 72 hour(s))  CBC     Status: Abnormal   Collection Time: 09/03/16  9:27 AM  Result Value Ref Range   WBC 9.0 4.0 - 10.5 K/uL   RBC 3.67 (L) 3.87 - 5.11 MIL/uL   Hemoglobin 11.6 (L) 12.0 - 15.0 g/dL   HCT 35.4 (L) 36.0 - 46.0 %   MCV 96.5 78.0 - 100.0 fL   MCH 31.6 26.0 - 34.0 pg   MCHC 32.8 30.0 - 36.0 g/dL   RDW 13.9 11.5 - 15.5 %   Platelets 416 (H) 150 - 400 K/uL  Basic metabolic panel     Status: Abnormal   Collection Time: 09/04/16  5:23 AM  Result Value Ref Range   Sodium 137 135 - 145 mmol/L   Potassium 3.6 3.5 - 5.1 mmol/L   Chloride 100 (L) 101 - 111 mmol/L   CO2 26 22 - 32 mmol/L   Glucose, Bld 91 65 - 99 mg/dL   BUN 23 (H) 6 - 20 mg/dL   Creatinine, Ser 0.89 0.44 - 1.00 mg/dL   Calcium 8.8 (L) 8.9 - 10.3 mg/dL   GFR calc non Af Amer 54 (L) >60 mL/min   GFR calc Af Amer >60 >60 mL/min    Comment: (NOTE) The eGFR has been calculated using the CKD EPI equation. This calculation has not been validated in all clinical situations. eGFR's persistently <60 mL/min signify possible Chronic Kidney Disease.    Anion gap 11 5 - 15     HEENT: normal, negative JVD Cardio: RRR and No murmur Resp: CTA B/L and Unlabored breathing, decreased breath sounds at both bases GI: BS positive and Nontender, nondistended Extremity:  Pulses positive and No Edema Skin:   Intact and Other No evidence of bruising, erythema or drainage at IV site, ecchymosis, left arm and forearm resolving Neuro:  Alert/Oriented x3, Normal Sensory, Abnormal Motor Motor strength is 4/5 left deltoid, biceps, triceps, grip, hip flexor, knee extensor, ankle dorsiflexor5/5 on the right side. and Abnormal [facility] Ataxic/ dec [facility] Musc/Skel:  Other No pain with active range of motion of both upper and lower limbs Gen. no acute distress   Assessment/Plan: 1. 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  Stable for D/C today F/u PCP in 3-4 weeks F/u PM&R 2 weeks See D/C summary See D/C instructions FIM: Function - Bathing Position: Wheelchair/chair at sink Body parts bathed by patient: Right arm, Left arm, Front perineal area, Buttocks, Right upper leg, Left upper leg, Right lower leg, Left lower leg, Chest, Abdomen Body parts bathed by helper: Back Bathing not applicable: Chest, Abdomen, Left upper leg, Right lower leg, Left lower leg, Back (Pt did not want to attempt this session.) Assist Level: Supervision or verbal cues  Function- Upper Body Dressing/Undressing What is the patient wearing?: Pull over shirt/dress Pull over shirt/dress - Perfomed by patient: Thread/unthread right sleeve, Thread/unthread left sleeve, Put head through opening, Pull shirt over trunk Pull over shirt/dress - Perfomed by helper: Pull shirt over trunk Button up shirt -  Perfomed by patient: Thread/unthread right sleeve, Thread/unthread left sleeve, Pull shirt around back, Button/unbutton shirt (zipper) Assist Level: More than reasonable time Function - Lower Body Dressing/Undressing What is the patient wearing?: Underwear, Socks, Shoes Position: Sitting EOB Underwear - Performed by patient: Thread/unthread right underwear leg, Thread/unthread left underwear leg, Pull underwear up/down Pants- Performed by patient: Thread/unthread right pants leg, Thread/unthread left pants leg, Pull pants up/down Non-skid slipper socks- Performed by patient: Don/doff right sock, Don/doff left sock Non-skid  slipper socks- Performed by helper: Don/doff left sock, Don/doff right sock Socks - Performed by patient: Don/doff right sock, Don/doff left sock Shoes - Performed by patient: Don/doff right shoe, Don/doff left shoe, Fasten left, Fasten right TED Hose - Performed by helper:  (pt stated TEDs were giving her a rash, no MD order for them) Assist for footwear: Independent Assist for lower body dressing: More than reasonable time  Function - Toileting Toileting steps completed by patient: Adjust clothing prior to toileting, Adjust clothing after toileting, Performs perineal hygiene Toileting steps completed by helper: Performs perineal hygiene Toileting Assistive Devices: Grab bar or rail Assist level: More than reasonable time  Function - Air cabin crew transfer assistive device: Grab bar, Walker Assist level to toilet: No Help, no cues, assistive device, takes more than a reasonable amount of time Assist level from toilet: No Help, no cues, assistive device, takes more than a reasonable amount of time Assist level to bedside commode (at bedside): Touching or steadying assistance (Pt > 75%) Assist level from bedside commode (at bedside): Touching or steadying assistance (Pt > 75%)  Function - Chair/bed transfer Chair/bed transfer method: Squat pivot Chair/bed transfer assist level: No Help, no cues, assistive device, takes more than a reasonable amount of time Chair/bed transfer assistive device: Armrests, Walker  Function - Locomotion: Wheelchair Will patient use wheelchair at discharge?: No Type: Manual Max wheelchair distance: 147f Assist Level: Supervision or verbal cues Assist Level: Supervision or verbal cues Wheel 150 feet activity did not occur: Safety/medical concerns Assist Level: Supervision or verbal cues Turns around,maneuvers to table,bed, and toilet,negotiates 3% grade,maneuvers on rugs and over doorsills: No Function - Locomotion: Ambulation Assistive device:  Walker-rolling Max distance: 1075f Assist level: Supervision or verbal cues Assist level: Supervision or verbal cues Walk 50 feet with 2 turns activity did not occur: Safety/medical concerns Assist level: Supervision or verbal cues Walk 150 feet activity did not occur: Safety/medical concerns Walk 10 feet on uneven surfaces activity did not occur: Safety/medical concerns Assist level: Supervision or verbal cues  Function - Comprehension Comprehension: Auditory Comprehension assist level: Follows complex conversation/direction with extra time/assistive device  Function - Expression Expression: Verbal Expression assist level: Expresses complex ideas: With extra time/assistive device  Function - Social Interaction Social Interaction assist level: Interacts appropriately with others with medication or extra time (anti-anxiety, antidepressant).  Function - Problem Solving Problem solving assist level: Solves complex problems: With extra time  Function - Memory Memory assist level: More than reasonable amount of time Patient normally able to recall (first 3 days only): Current season, Location of own room, Staff names and faces, That he or she is in a hospital  Medical Problem List and Plan: 1. Functional and mobility deficitssecondary to embolic infarcts involving right MCA and inferior right cerebellum -CIR PT, OT, speech, plan discharge today 2. DVT Prophylaxis/Anticoagulation: Mechanical: Sequential compression devices, below knee Bilateral lower extremities, eliquis 3. Pain Management: tylenol for mild pain and tramadol for more severe pain 4. Mood: team to provide ego  supports -monitor while here. Pt seems to be in reasonable spirits -SW to screen patient 5. Neuropsych: This patient iscapable of making decisions on herown behalf. 6. Skin/Wound Care: , elevate LUE for edema control -encourage adequate nutrition 7.  Fluids/Electrolytes/Nutrition: Encourage PO -Labs during admission to follow up fluids/elecrolytes 8. A Fib: rate controlled at present. Monitor as we begin mobilizing patient more. -amiodarone for rate control -eliquis for anticoagulation, Hgb up at 11.6- 9. HTN: Monitor bp's with increasing mobility. Diastolic elevated today -continue dyazide daily- 10. LUE embolectomy: limb warm,  -continue to elevate -ACE wrap if needed -eliquis 11. Hypoxemia, resolved 12. Advance directives, patient  has a DO NOT RESUSCITATE directive at home and would like to maintain this in the hospital setting.  LOS (Days) 8 A FACE TO FACE EVALUATION WAS PERFORMED  Christine Pugh 09/05/2016, 10:22 AM

## 2016-09-05 NOTE — Telephone Encounter (Signed)
Yes, OK to fill Eliquis. This is a new medication per D/C summary.

## 2016-09-06 DIAGNOSIS — I742 Embolism and thrombosis of arteries of the upper extremities: Secondary | ICD-10-CM | POA: Diagnosis not present

## 2016-09-06 DIAGNOSIS — I350 Nonrheumatic aortic (valve) stenosis: Secondary | ICD-10-CM | POA: Diagnosis not present

## 2016-09-06 DIAGNOSIS — Z48812 Encounter for surgical aftercare following surgery on the circulatory system: Secondary | ICD-10-CM | POA: Diagnosis not present

## 2016-09-06 DIAGNOSIS — Z7982 Long term (current) use of aspirin: Secondary | ICD-10-CM | POA: Diagnosis not present

## 2016-09-06 DIAGNOSIS — I484 Atypical atrial flutter: Secondary | ICD-10-CM | POA: Diagnosis not present

## 2016-09-06 DIAGNOSIS — I69354 Hemiplegia and hemiparesis following cerebral infarction affecting left non-dominant side: Secondary | ICD-10-CM | POA: Diagnosis not present

## 2016-09-06 DIAGNOSIS — Z79891 Long term (current) use of opiate analgesic: Secondary | ICD-10-CM | POA: Diagnosis not present

## 2016-09-06 DIAGNOSIS — Z7901 Long term (current) use of anticoagulants: Secondary | ICD-10-CM | POA: Diagnosis not present

## 2016-09-06 DIAGNOSIS — I10 Essential (primary) hypertension: Secondary | ICD-10-CM | POA: Diagnosis not present

## 2016-09-06 DIAGNOSIS — I4891 Unspecified atrial fibrillation: Secondary | ICD-10-CM | POA: Diagnosis not present

## 2016-09-07 ENCOUNTER — Ambulatory Visit (INDEPENDENT_AMBULATORY_CARE_PROVIDER_SITE_OTHER): Payer: Medicare Other | Admitting: *Deleted

## 2016-09-07 DIAGNOSIS — I639 Cerebral infarction, unspecified: Secondary | ICD-10-CM | POA: Diagnosis not present

## 2016-09-07 DIAGNOSIS — Z48812 Encounter for surgical aftercare following surgery on the circulatory system: Secondary | ICD-10-CM | POA: Diagnosis not present

## 2016-09-07 DIAGNOSIS — I10 Essential (primary) hypertension: Secondary | ICD-10-CM | POA: Diagnosis not present

## 2016-09-07 DIAGNOSIS — I484 Atypical atrial flutter: Secondary | ICD-10-CM | POA: Diagnosis not present

## 2016-09-07 DIAGNOSIS — I69354 Hemiplegia and hemiparesis following cerebral infarction affecting left non-dominant side: Secondary | ICD-10-CM | POA: Diagnosis not present

## 2016-09-07 DIAGNOSIS — I749 Embolism and thrombosis of unspecified artery: Secondary | ICD-10-CM

## 2016-09-07 DIAGNOSIS — I350 Nonrheumatic aortic (valve) stenosis: Secondary | ICD-10-CM | POA: Diagnosis not present

## 2016-09-07 DIAGNOSIS — I742 Embolism and thrombosis of arteries of the upper extremities: Secondary | ICD-10-CM | POA: Diagnosis not present

## 2016-09-07 MED ORDER — APIXABAN 5 MG PO TABS: 5.0000 mg | ORAL_TABLET | Freq: Two times a day (BID) | ORAL | 3 refills | Status: DC

## 2016-09-07 NOTE — Telephone Encounter (Signed)
Medication sent to pharmacy  

## 2016-09-08 DIAGNOSIS — I742 Embolism and thrombosis of arteries of the upper extremities: Secondary | ICD-10-CM | POA: Diagnosis not present

## 2016-09-08 DIAGNOSIS — Z48812 Encounter for surgical aftercare following surgery on the circulatory system: Secondary | ICD-10-CM | POA: Diagnosis not present

## 2016-09-08 DIAGNOSIS — I350 Nonrheumatic aortic (valve) stenosis: Secondary | ICD-10-CM | POA: Diagnosis not present

## 2016-09-08 DIAGNOSIS — I69354 Hemiplegia and hemiparesis following cerebral infarction affecting left non-dominant side: Secondary | ICD-10-CM | POA: Diagnosis not present

## 2016-09-08 DIAGNOSIS — I10 Essential (primary) hypertension: Secondary | ICD-10-CM | POA: Diagnosis not present

## 2016-09-08 DIAGNOSIS — I484 Atypical atrial flutter: Secondary | ICD-10-CM | POA: Diagnosis not present

## 2016-09-08 NOTE — Progress Notes (Signed)
Carelink Summary Report / Loop Recorder 

## 2016-09-09 DIAGNOSIS — Z48812 Encounter for surgical aftercare following surgery on the circulatory system: Secondary | ICD-10-CM | POA: Diagnosis not present

## 2016-09-09 DIAGNOSIS — I69354 Hemiplegia and hemiparesis following cerebral infarction affecting left non-dominant side: Secondary | ICD-10-CM | POA: Diagnosis not present

## 2016-09-09 DIAGNOSIS — I742 Embolism and thrombosis of arteries of the upper extremities: Secondary | ICD-10-CM | POA: Diagnosis not present

## 2016-09-09 DIAGNOSIS — I484 Atypical atrial flutter: Secondary | ICD-10-CM | POA: Diagnosis not present

## 2016-09-09 DIAGNOSIS — I10 Essential (primary) hypertension: Secondary | ICD-10-CM | POA: Diagnosis not present

## 2016-09-09 DIAGNOSIS — I350 Nonrheumatic aortic (valve) stenosis: Secondary | ICD-10-CM | POA: Diagnosis not present

## 2016-09-10 ENCOUNTER — Encounter: Payer: Self-pay | Admitting: Vascular Surgery

## 2016-09-10 DIAGNOSIS — I742 Embolism and thrombosis of arteries of the upper extremities: Secondary | ICD-10-CM | POA: Diagnosis not present

## 2016-09-10 DIAGNOSIS — I350 Nonrheumatic aortic (valve) stenosis: Secondary | ICD-10-CM | POA: Diagnosis not present

## 2016-09-10 DIAGNOSIS — I484 Atypical atrial flutter: Secondary | ICD-10-CM | POA: Diagnosis not present

## 2016-09-10 DIAGNOSIS — I10 Essential (primary) hypertension: Secondary | ICD-10-CM | POA: Diagnosis not present

## 2016-09-10 DIAGNOSIS — I69354 Hemiplegia and hemiparesis following cerebral infarction affecting left non-dominant side: Secondary | ICD-10-CM | POA: Diagnosis not present

## 2016-09-10 DIAGNOSIS — Z48812 Encounter for surgical aftercare following surgery on the circulatory system: Secondary | ICD-10-CM | POA: Diagnosis not present

## 2016-09-11 DIAGNOSIS — Z48812 Encounter for surgical aftercare following surgery on the circulatory system: Secondary | ICD-10-CM | POA: Diagnosis not present

## 2016-09-11 DIAGNOSIS — I742 Embolism and thrombosis of arteries of the upper extremities: Secondary | ICD-10-CM | POA: Diagnosis not present

## 2016-09-11 DIAGNOSIS — I484 Atypical atrial flutter: Secondary | ICD-10-CM | POA: Diagnosis not present

## 2016-09-11 DIAGNOSIS — I69354 Hemiplegia and hemiparesis following cerebral infarction affecting left non-dominant side: Secondary | ICD-10-CM | POA: Diagnosis not present

## 2016-09-11 DIAGNOSIS — I350 Nonrheumatic aortic (valve) stenosis: Secondary | ICD-10-CM | POA: Diagnosis not present

## 2016-09-11 DIAGNOSIS — I10 Essential (primary) hypertension: Secondary | ICD-10-CM | POA: Diagnosis not present

## 2016-09-15 DIAGNOSIS — I69354 Hemiplegia and hemiparesis following cerebral infarction affecting left non-dominant side: Secondary | ICD-10-CM | POA: Diagnosis not present

## 2016-09-15 DIAGNOSIS — I742 Embolism and thrombosis of arteries of the upper extremities: Secondary | ICD-10-CM | POA: Diagnosis not present

## 2016-09-15 DIAGNOSIS — I10 Essential (primary) hypertension: Secondary | ICD-10-CM | POA: Diagnosis not present

## 2016-09-15 DIAGNOSIS — Z48812 Encounter for surgical aftercare following surgery on the circulatory system: Secondary | ICD-10-CM | POA: Diagnosis not present

## 2016-09-15 DIAGNOSIS — I350 Nonrheumatic aortic (valve) stenosis: Secondary | ICD-10-CM | POA: Diagnosis not present

## 2016-09-15 DIAGNOSIS — I484 Atypical atrial flutter: Secondary | ICD-10-CM | POA: Diagnosis not present

## 2016-09-17 ENCOUNTER — Encounter: Payer: Self-pay | Admitting: Vascular Surgery

## 2016-09-17 ENCOUNTER — Ambulatory Visit (INDEPENDENT_AMBULATORY_CARE_PROVIDER_SITE_OTHER): Payer: Self-pay | Admitting: Vascular Surgery

## 2016-09-17 VITALS — BP 102/63 | HR 62 | Temp 97.0°F | Resp 18 | Ht 63.0 in | Wt 150.0 lb

## 2016-09-17 DIAGNOSIS — I742 Embolism and thrombosis of arteries of the upper extremities: Secondary | ICD-10-CM

## 2016-09-17 NOTE — Progress Notes (Signed)
Patient is a 83 year old female who is status post left brachial embolectomy. She also had an embolic stroke during her hospital stay. This was treated by interventional radiology and her symptoms essentially resolved. She states she is still tired but overall feels well. She is on Eliquis.   Physical exam:  Vitals:   09/17/16 1408  BP: 102/63  Pulse: 62  Resp: 18  Temp: 97 F (36.1 C)  SpO2: 96%  Weight: 150 lb (68 kg)  Height: 5\' 3"  (1.6 m)    Extremities: 2+ brachial 2+ radial pulse left side healing incision small hematoma 3 x 2 cm  Neuro: Symmetric upper and lower extremity motor strength 5 over 5  Assessment: Doing well status post left brachial embolectomy. The patient did have some blood per rectum recently but states she does have a hemorrhoid. I told her that if she continues to have chronic bleeding with my would need to address her anticoagulation. However, she is at very high risk for recurrent embolic events and would avoid stopping this at all costs unless she has life-threatening bleeding.  She will follow with me on an as-needed basis.  Ruta Hinds, MD [facility] Office: [phone] Pager: [phone]

## 2016-09-18 DIAGNOSIS — Z48812 Encounter for surgical aftercare following surgery on the circulatory system: Secondary | ICD-10-CM | POA: Diagnosis not present

## 2016-09-18 DIAGNOSIS — I69354 Hemiplegia and hemiparesis following cerebral infarction affecting left non-dominant side: Secondary | ICD-10-CM | POA: Diagnosis not present

## 2016-09-18 DIAGNOSIS — I484 Atypical atrial flutter: Secondary | ICD-10-CM | POA: Diagnosis not present

## 2016-09-18 DIAGNOSIS — I10 Essential (primary) hypertension: Secondary | ICD-10-CM | POA: Diagnosis not present

## 2016-09-18 DIAGNOSIS — I742 Embolism and thrombosis of arteries of the upper extremities: Secondary | ICD-10-CM | POA: Diagnosis not present

## 2016-09-18 DIAGNOSIS — I350 Nonrheumatic aortic (valve) stenosis: Secondary | ICD-10-CM | POA: Diagnosis not present

## 2016-09-21 DIAGNOSIS — I350 Nonrheumatic aortic (valve) stenosis: Secondary | ICD-10-CM | POA: Diagnosis not present

## 2016-09-21 DIAGNOSIS — I484 Atypical atrial flutter: Secondary | ICD-10-CM | POA: Diagnosis not present

## 2016-09-21 DIAGNOSIS — I742 Embolism and thrombosis of arteries of the upper extremities: Secondary | ICD-10-CM | POA: Diagnosis not present

## 2016-09-21 DIAGNOSIS — I10 Essential (primary) hypertension: Secondary | ICD-10-CM | POA: Diagnosis not present

## 2016-09-21 DIAGNOSIS — Z48812 Encounter for surgical aftercare following surgery on the circulatory system: Secondary | ICD-10-CM | POA: Diagnosis not present

## 2016-09-21 DIAGNOSIS — I69354 Hemiplegia and hemiparesis following cerebral infarction affecting left non-dominant side: Secondary | ICD-10-CM | POA: Diagnosis not present

## 2016-09-21 LAB — CUP PACEART REMOTE DEVICE CHECK
Date Time Interrogation Session: 20180105133620
MDC IDC PG IMPLANT DT: 20170609

## 2016-09-21 NOTE — Progress Notes (Signed)
Carelink summary report received. Battery status OK. Normal device function. No new symptom episodes, tachy episodes, brady, or pause episodes. 2 AF 90.2% +Eliquis +Amiodarone Monthly summary reports and ROV/PRN

## 2016-09-22 ENCOUNTER — Encounter: Payer: Self-pay | Admitting: Internal Medicine

## 2016-09-23 DIAGNOSIS — I484 Atypical atrial flutter: Secondary | ICD-10-CM | POA: Diagnosis not present

## 2016-09-23 DIAGNOSIS — Z48812 Encounter for surgical aftercare following surgery on the circulatory system: Secondary | ICD-10-CM | POA: Diagnosis not present

## 2016-09-23 DIAGNOSIS — I742 Embolism and thrombosis of arteries of the upper extremities: Secondary | ICD-10-CM | POA: Diagnosis not present

## 2016-09-23 DIAGNOSIS — I10 Essential (primary) hypertension: Secondary | ICD-10-CM | POA: Diagnosis not present

## 2016-09-23 DIAGNOSIS — I69354 Hemiplegia and hemiparesis following cerebral infarction affecting left non-dominant side: Secondary | ICD-10-CM | POA: Diagnosis not present

## 2016-09-23 DIAGNOSIS — I350 Nonrheumatic aortic (valve) stenosis: Secondary | ICD-10-CM | POA: Diagnosis not present

## 2016-09-23 NOTE — Telephone Encounter (Signed)
I reviewed the chart. Status post left brachial embolectomy by Dr. Oneida Alar, on anticoagulation therapy in the setting of concurrent atrial arrhythmias. Not clear that the Eliquis in and of itself would make her feel fatigued, but would be concerned about potential bleeding. Last hemoglobin was 11.6. Please have her get a CBC.

## 2016-09-24 ENCOUNTER — Other Ambulatory Visit: Payer: Self-pay | Admitting: *Deleted

## 2016-09-24 ENCOUNTER — Telehealth: Payer: Self-pay | Admitting: *Deleted

## 2016-09-24 DIAGNOSIS — Z7901 Long term (current) use of anticoagulants: Secondary | ICD-10-CM

## 2016-09-24 DIAGNOSIS — R5383 Other fatigue: Secondary | ICD-10-CM

## 2016-09-24 NOTE — Telephone Encounter (Signed)
Patient notified and verbalized understanding.  Will fax lab order over to Dr. Arcola Jansky office as she prefers to have lab done there.

## 2016-09-24 NOTE — Telephone Encounter (Signed)
Christine Sark, MD at 09/23/2016 11:37 AM   Status: Signed    I reviewed the chart. Status post left brachial embolectomy by Dr. Oneida Alar, on anticoagulation therapy in the setting of concurrent atrial arrhythmias. Not clear that the Eliquis in and of itself would make her feel fatigued, but would be concerned about potential bleeding. Last hemoglobin was 11.6. Please have her get a CBC.

## 2016-09-24 NOTE — Telephone Encounter (Signed)
Just making sure you got this  ----- Message -----  From: Satira Sark, MD  Sent: 09/23/2016 11:39 AM  To: Dionicio Stall, RN, Laurine Blazer, LPN  Subject: Visit Follow-Up Question               ----- Message from Satira Sark, MD sent at 09/23/2016 11:39 AM EST -----      ----- Message from [name] to [name], MD sent at 09/22/2016 7:44 PM -----   While at [facility], they' prescribed Elequis   New for me.  A few days ago, I have experienced extreme lethargy. It takes a lot of effort to do anything ...is the mew med interacting with metropolol or Amneodorone?  Thanks. Johnny Bridge. I need some energy. My appetite has also gone [place].

## 2016-09-25 DIAGNOSIS — Z7901 Long term (current) use of anticoagulants: Secondary | ICD-10-CM | POA: Diagnosis not present

## 2016-09-25 DIAGNOSIS — R5383 Other fatigue: Secondary | ICD-10-CM | POA: Diagnosis not present

## 2016-09-28 DIAGNOSIS — I484 Atypical atrial flutter: Secondary | ICD-10-CM | POA: Diagnosis not present

## 2016-09-28 DIAGNOSIS — Z48812 Encounter for surgical aftercare following surgery on the circulatory system: Secondary | ICD-10-CM | POA: Diagnosis not present

## 2016-09-28 DIAGNOSIS — I742 Embolism and thrombosis of arteries of the upper extremities: Secondary | ICD-10-CM | POA: Diagnosis not present

## 2016-09-28 DIAGNOSIS — I350 Nonrheumatic aortic (valve) stenosis: Secondary | ICD-10-CM | POA: Diagnosis not present

## 2016-09-28 DIAGNOSIS — I69354 Hemiplegia and hemiparesis following cerebral infarction affecting left non-dominant side: Secondary | ICD-10-CM | POA: Diagnosis not present

## 2016-09-28 DIAGNOSIS — I10 Essential (primary) hypertension: Secondary | ICD-10-CM | POA: Diagnosis not present

## 2016-09-29 ENCOUNTER — Encounter: Payer: Self-pay | Admitting: *Deleted

## 2016-09-29 ENCOUNTER — Inpatient Hospital Stay: Payer: Medicare Other | Admitting: Physical Medicine & Rehabilitation

## 2016-10-02 ENCOUNTER — Encounter: Payer: Self-pay | Admitting: *Deleted

## 2016-10-03 LAB — CUP PACEART REMOTE DEVICE CHECK
MDC IDC PG IMPLANT DT: 20170609
MDC IDC SESS DTM: 20180204134307

## 2016-10-03 NOTE — Progress Notes (Signed)
Carelink summary report received. Battery status OK. Normal device function. No new symptom episodes, tachy episodes, brady, or pause episodes. No new AF episodes. AF burden 30.3% +eliquis.  Monthly summary reports and ROV/PRN

## 2016-10-04 DIAGNOSIS — Z6826 Body mass index (BMI) 26.0-26.9, adult: Secondary | ICD-10-CM | POA: Diagnosis not present

## 2016-10-04 DIAGNOSIS — I63131 Cerebral infarction due to embolism of right carotid artery: Secondary | ICD-10-CM | POA: Diagnosis not present

## 2016-10-04 DIAGNOSIS — I350 Nonrheumatic aortic (valve) stenosis: Secondary | ICD-10-CM | POA: Diagnosis not present

## 2016-10-05 DIAGNOSIS — I484 Atypical atrial flutter: Secondary | ICD-10-CM | POA: Diagnosis not present

## 2016-10-05 DIAGNOSIS — I350 Nonrheumatic aortic (valve) stenosis: Secondary | ICD-10-CM | POA: Diagnosis not present

## 2016-10-05 DIAGNOSIS — I742 Embolism and thrombosis of arteries of the upper extremities: Secondary | ICD-10-CM | POA: Diagnosis not present

## 2016-10-05 DIAGNOSIS — I69354 Hemiplegia and hemiparesis following cerebral infarction affecting left non-dominant side: Secondary | ICD-10-CM | POA: Diagnosis not present

## 2016-10-05 DIAGNOSIS — I10 Essential (primary) hypertension: Secondary | ICD-10-CM | POA: Diagnosis not present

## 2016-10-05 DIAGNOSIS — Z48812 Encounter for surgical aftercare following surgery on the circulatory system: Secondary | ICD-10-CM | POA: Diagnosis not present

## 2016-10-06 ENCOUNTER — Ambulatory Visit (INDEPENDENT_AMBULATORY_CARE_PROVIDER_SITE_OTHER): Payer: Medicare Other | Admitting: *Deleted

## 2016-10-06 DIAGNOSIS — R55 Syncope and collapse: Secondary | ICD-10-CM

## 2016-10-06 NOTE — Progress Notes (Signed)
Carelink Summary Report / Loop Recorder 

## 2016-10-07 ENCOUNTER — Encounter: Payer: Self-pay | Admitting: Physician Assistant

## 2016-10-07 DIAGNOSIS — I480 Paroxysmal atrial fibrillation: Secondary | ICD-10-CM | POA: Insufficient documentation

## 2016-10-07 NOTE — Progress Notes (Signed)
Cardiology Office Note    Date:  10/08/2016  ID:  Christine Pugh, DOB 25-Jun-1922, MRN [id number] PCP:  Gar Ponto, MD  Cardiologist:  Dr. McDowell/EP - [place]   Chief Complaint: f/u stroke  History of Present Illness:  Christine Pugh is a 83 y.o. female with history of longstanding atypical atrial flutter (previously refused anticoagulation), paroxysmal atrial fib (identified on event monitoring), syncope s/p ILR, HTN, stroke, moderate-severe aortic stenosis, hypothyroidism who presents for post-hospital follow-up with recent LUE embolism and stroke.  She has h/o ILR placed for syncope. She's previously been found to have atrial flutter as well as possible atrial tach and atrial fib. She has been on amiodarone for this. She previously declined anticoagulation. She was recently admitted 08/2016 with actue onset of LUE paralysis. She was found to have subacute L-cerebellar stroke and CTA LUE showed occlusion of distal left axillary artery approximately 5 cm with distal reconstitution of brachial artery at mid proximal humerus. Dr. Oneida Alar consulted and patient taken to OR for Left brachial, radial, ulnar, axillary embolectomy the same day. Post procedure patient noted to have left facial droop, rightward gaze, slurred speech and left hemiparesis with CTA revealing R-MCA embolus. She underwent thrombectomy with revascularization of R-MCA. It was felt her stroke was embolic in nature. She was transitioned to Eliquis once more stable. 2D echo 08/24/16 showed EF 65-70%, grade 2DD, moderate AS, mild AI/MR/TR. Of note prior echoes had shown severe AS prompting discussion of TAVR for which she had remained undecided about pursuing. Most recent labs showed Hgb 13.5, Plt 229 (09/25/16), K 3.6, Cr 0.89 (09/04/16).  She returns for follow-up with her son who was an Clinical biochemist. She reports feeling fatigued with minimal motivation since discharge. No specific symptoms of chest pain, dyspnea, palpitations, edema,  dizziness or syncope. No bleeding. She does report constipation despite taking stool softeners. Her appetite has been poor. She's been started on Zoloft for possible depression by her PCP. She reports she is back to taking Toprol 25mg  BID. This was not on her recent DC med list; apparently held during admission due to borderline HR and BP.   Past Medical History:  Diagnosis Date  . Aortic insufficiency    Mild  . Aortic stenosis    Moderate to severe  . 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  s/p Left brachial, radial, ulnar, axillary embolectomy 08/2016.  Marland Kitchen Atypical atrial flutter ([place])   . Embolic stroke involving right cerebellar artery ([place])   . Essential hypertension   . Hypothyroidism   . Ischemia of extremity   . Moderate to severe aortic stenosis   . PAF (paroxysmal atrial fibrillation) ([place])   . Paroxysmal atrial tachycardia ([place])   . Stroke ([place]) 08/2016  . Syncope    a. s/p ILR 01/2016.    Past Surgical History:  Procedure Laterality Date  . APPENDECTOMY    . BREAST LUMPECTOMY    . dental implant    . EP IMPLANTABLE DEVICE N/A 01/10/2016   Procedure: Loop Recorder Insertion;  Surgeon: [name], MD;  Location: [facility];  Service: Cardiovascular;  Laterality: N/A;  . IR GENERIC HISTORICAL  08/23/2016   IR US GUIDE VASC ACCESS RIGHT 08/23/2016 Corrie Mckusick, DO [facility]  . IR GENERIC HISTORICAL  08/23/2016   IR PERCUTANEOUS ART THROMBECTOMY/INFUSION INTRACRANIAL INC DIAG ANGIO 08/23/2016 Corrie Mckusick, DO [facility]  . IR GENERIC HISTORICAL  08/23/2016   IR ANGIO INTRA EXTRACRAN SEL COM CAROTID INNOMINATE  UNI L MOD SED 08/23/2016 Corrie Mckusick, DO [facility]  . RADIOLOGY WITH ANESTHESIA Bilateral 08/23/2016   Procedure: RADIOLOGY WITH ANESTHESIA;  Surgeon: Medication Radiologist, MD;  Location: [facility];  Service: Radiology;  Laterality: Bilateral;  . THROMBECTOMY BRACHIAL ARTERY Left 08/23/2016   Procedure: Left Radial, Ulnar, Brachial and  Axillary Embolectomy;  Surgeon: Elam Dutch, MD;  Location: [facility];  Service: Vascular;  Laterality: Left;  . THYROIDECTOMY    . TONSILLECTOMY    . TOTAL ABDOMINAL HYSTERECTOMY    . VITRECTOMY AND CATARACT      Current Medications: Current Outpatient Prescriptions  Medication Sig Dispense Refill  . amiodarone (PACERONE) 100 MG tablet Take 0.5 tablets (50 mg total) by mouth daily. 30 tablet 0  . apixaban (ELIQUIS) 5 MG TABS tablet Take 1 tablet (5 mg total) by mouth 2 (two) times daily. 180 tablet 3  . atorvastatin (LIPITOR) 20 MG tablet Take 1 tablet (20 mg total) by mouth daily at 6 PM. 30 tablet 0  . cyproheptadine (PERIACTIN) 4 MG tablet Take 4 mg by mouth 3 (three) times daily as needed for allergies. Reported on 08/27/2015    . docusate sodium (COLACE) 100 MG capsule Take 1 capsule (100 mg total) by mouth daily. 60 capsule 0  . hydrocortisone (ANUSOL-HC) 25 MG suppository Place 1 suppository (25 mg total) rectally 2 (two) times daily. 6 suppository 0  . levothyroxine (SYNTHROID, LEVOTHROID) 112 MCG tablet Take 1 tablet (112 mcg total) by mouth daily before breakfast. 30 tablet 0  . Lutein-Zeaxanthin 25-5 MG CAPS Take 1 capsule by mouth 2 (two) times daily.    . metoprolol succinate (TOPROL-XL) 25 MG 24 hr tablet     . Multiple Vitamin (MULTIVITAMIN) tablet Take 1 tablet by mouth daily.      . pantoprazole (PROTONIX) 40 MG tablet Take 1 tablet (40 mg total) by mouth daily. 30 tablet 0  . sertraline (ZOLOFT) 50 MG tablet Take 50 mg by mouth daily.    Marland Kitchen triamcinolone cream (KENALOG) 0.1 % Apply 1 application topically daily as needed (irritated skin).     Marland Kitchen triamterene-hydrochlorothiazide (DYAZIDE) 37.5-25 MG capsule Take 1 each (1 capsule total) by mouth daily. 30 capsule 0   No current facility-administered medications for this visit.      Allergies:   Patient has no known allergies.   Social History   Social History  . Marital status: Widowed    Spouse name: N/A  . Number  of children: N/A  . Years of education: N/A   Occupational History  . Retired    Social History Main Topics  . Smoking status: Never Smoker  . Smokeless tobacco: Never Used  . Alcohol use No  . Drug use: No  . Sexual activity: Not Asked   Other Topics Concern  . None   Social History Narrative   Pt lives in [place] alone.   Widowed   Retired Network engineer     Family History:  Family History  Problem Relation Age of Onset  . Coronary artery disease     ROS:   Please see the history of present illness. Some weight loss from lack of appetite All other systems are reviewed and otherwise negative.    PHYSICAL EXAM:   VS:  BP 112/68   Pulse 70   Ht 5\' 3"  (1.6 m)   Wt 143 lb (64.9 kg)   SpO2 98%   BMI 25.33 kg/m   BMI: Body mass index is 25.33 kg/m. GEN: Well  nourished thin WF, in no acute distress  HEENT: normocephalic, atraumatic Neck: no JVD, carotid bruits, or masses Cardiac: RRR; 3/6 SEM at RUSB, diminished S2, no rubs gallops, no edema  Respiratory:  clear to auscultation bilaterally, normal work of breathing GI: soft, nontender, nondistended, + BS MS: no deformity or atrophy  Skin: warm and dry, no rash Neuro:  Alert and Oriented x 3, Strength and sensation are intact, follows commands Psych: euthymic mood, full affect  Wt Readings from Last 3 Encounters:  10/08/16 143 lb (64.9 kg)  09/17/16 150 lb (68 kg)  09/02/16 155 lb 4.8 oz (70.4 kg)      Studies/Labs Reviewed:   EKG:  EKG was not ordered today  Recent Labs: 08/29/2016: ALT 22 09/01/2016: Magnesium 1.7 09/03/2016: Hemoglobin 11.6; Platelets 416 09/04/2016: BUN 23; Creatinine, Ser 0.89; Potassium 3.6; Sodium 137   Lipid Panel    Component Value Date/Time   CHOL 164 08/25/2016 0331   TRIG 69 08/25/2016 0331   HDL 56 08/25/2016 0331   CHOLHDL 2.9 08/25/2016 0331   VLDL 14 08/25/2016 0331   LDLCALC 94 08/25/2016 0331    Additional studies/ records that were reviewed today include: Summarized  above.    ASSESSMENT & PLAN:   1. Paroxysmal atrial flutter/atrial fib - now on Eliquis. In the past she declined anticoagulation (and was not a great candidate to begin with given prior falls). However, now that she's had an embolic event, it is reasonable to continue this with caution. She has had some weight loss due to poor appetite. I have asked her to monitor her weight and call if she falls to 137 or less - the point at which she'd reach 60kg - as we would need to dose-adjust Eliquis downward at that time. Hopefully that won't be the case. We discussed importance of nutrition. She said she doesn't even want to look at another Ensure or Boost. She is maintaining NSR by exam today. 2. Aortic stenosis - I wonder if some of the fatigue and fogginess she is feeling is related to her borderline BP. Her Toprol was held in the hospital for this reason. She did not realize it was not on her home med list and is back on 25mg  BID. Will reduce this to 12.5mg  BID. Ultimately may need to re-visit discussion of TAVR but with recent stroke would allow her to recover from this first. Additionally she is not particularly thrilled about the idea of this. 3. History of stroke and arterial occlusion of extremity - already followed up with vascular. Will need episodic surveillance BMET/CBC at the discretion of her primary cardiologist while on Eliquis. 4. Essential HTN - follow with decrease of metoprolol. 5. Constipation - I advised she may try Miralax and if no relief within a day to try a Dulcolax suppository. If constipation still not improved she should call her PCP. I advised she continue stool softeners daily.  Disposition: F/u with Dr. Domenic Polite as planned 11/2016.   Medication Adjustments/Labs and Tests Ordered: Current medicines are reviewed at length with the patient today.  Concerns regarding medicines are outlined above. Medication changes, Labs and Tests ordered today are summarized above and listed in  the Patient Instructions accessible in Encounters.   Raechel Ache PA-C  10/08/2016 4:00 PM    [facility] Location in [place], [place] [address] Ph: [phone]; Fax [phone]

## 2016-10-08 ENCOUNTER — Encounter: Payer: Self-pay | Admitting: Physician Assistant

## 2016-10-08 ENCOUNTER — Ambulatory Visit (INDEPENDENT_AMBULATORY_CARE_PROVIDER_SITE_OTHER): Payer: Medicare Other | Admitting: Physician Assistant

## 2016-10-08 VITALS — BP 112/68 | HR 70 | Ht 63.0 in | Wt 143.0 lb

## 2016-10-08 DIAGNOSIS — Z8673 Personal history of transient ischemic attack (TIA), and cerebral infarction without residual deficits: Secondary | ICD-10-CM

## 2016-10-08 DIAGNOSIS — I743 Embolism and thrombosis of arteries of the lower extremities: Secondary | ICD-10-CM

## 2016-10-08 DIAGNOSIS — I350 Nonrheumatic aortic (valve) stenosis: Secondary | ICD-10-CM

## 2016-10-08 DIAGNOSIS — I749 Embolism and thrombosis of unspecified artery: Secondary | ICD-10-CM

## 2016-10-08 DIAGNOSIS — I480 Paroxysmal atrial fibrillation: Secondary | ICD-10-CM

## 2016-10-08 DIAGNOSIS — I10 Essential (primary) hypertension: Secondary | ICD-10-CM

## 2016-10-08 DIAGNOSIS — I484 Atypical atrial flutter: Secondary | ICD-10-CM

## 2016-10-08 DIAGNOSIS — K5901 Slow transit constipation: Secondary | ICD-10-CM | POA: Diagnosis not present

## 2016-10-08 MED ORDER — METOPROLOL SUCCINATE ER 25 MG PO TB24: 12.5000 mg | ORAL_TABLET | Freq: Two times a day (BID) | ORAL | 6 refills | Status: DC

## 2016-10-08 NOTE — Patient Instructions (Signed)
Your physician recommends that you schedule a follow-up appointment in April  Your physician recommends that you continue on your current medications as directed. Please refer to the Current Medication list given to you today.  You may try Miralax or Dulcolax suppositories for constipation.   If your weight falls to 137 lbs or less, please call.   If you need a refill on your cardiac medications before your next appointment, please call your pharmacy.  Thank you for choosing [facility]!

## 2016-10-13 ENCOUNTER — Ambulatory Visit: Payer: Medicare Other | Admitting: Cardiology

## 2016-10-14 DIAGNOSIS — I350 Nonrheumatic aortic (valve) stenosis: Secondary | ICD-10-CM | POA: Diagnosis not present

## 2016-10-14 DIAGNOSIS — I484 Atypical atrial flutter: Secondary | ICD-10-CM | POA: Diagnosis not present

## 2016-10-14 DIAGNOSIS — I69354 Hemiplegia and hemiparesis following cerebral infarction affecting left non-dominant side: Secondary | ICD-10-CM | POA: Diagnosis not present

## 2016-10-14 DIAGNOSIS — I10 Essential (primary) hypertension: Secondary | ICD-10-CM | POA: Diagnosis not present

## 2016-10-14 DIAGNOSIS — I742 Embolism and thrombosis of arteries of the upper extremities: Secondary | ICD-10-CM | POA: Diagnosis not present

## 2016-10-14 DIAGNOSIS — Z48812 Encounter for surgical aftercare following surgery on the circulatory system: Secondary | ICD-10-CM | POA: Diagnosis not present

## 2016-10-15 ENCOUNTER — Ambulatory Visit: Payer: Medicare Other | Admitting: Cardiology

## 2016-10-19 DIAGNOSIS — I10 Essential (primary) hypertension: Secondary | ICD-10-CM | POA: Diagnosis not present

## 2016-10-19 DIAGNOSIS — I742 Embolism and thrombosis of arteries of the upper extremities: Secondary | ICD-10-CM | POA: Diagnosis not present

## 2016-10-19 DIAGNOSIS — Z48812 Encounter for surgical aftercare following surgery on the circulatory system: Secondary | ICD-10-CM | POA: Diagnosis not present

## 2016-10-19 DIAGNOSIS — I69354 Hemiplegia and hemiparesis following cerebral infarction affecting left non-dominant side: Secondary | ICD-10-CM | POA: Diagnosis not present

## 2016-10-19 DIAGNOSIS — I350 Nonrheumatic aortic (valve) stenosis: Secondary | ICD-10-CM | POA: Diagnosis not present

## 2016-10-19 DIAGNOSIS — I484 Atypical atrial flutter: Secondary | ICD-10-CM | POA: Diagnosis not present

## 2016-10-23 DIAGNOSIS — E039 Hypothyroidism, unspecified: Secondary | ICD-10-CM | POA: Diagnosis not present

## 2016-10-23 DIAGNOSIS — Z48812 Encounter for surgical aftercare following surgery on the circulatory system: Secondary | ICD-10-CM | POA: Diagnosis not present

## 2016-10-23 DIAGNOSIS — I69354 Hemiplegia and hemiparesis following cerebral infarction affecting left non-dominant side: Secondary | ICD-10-CM | POA: Diagnosis not present

## 2016-10-23 DIAGNOSIS — I482 Chronic atrial fibrillation: Secondary | ICD-10-CM | POA: Diagnosis not present

## 2016-10-23 DIAGNOSIS — E876 Hypokalemia: Secondary | ICD-10-CM | POA: Diagnosis not present

## 2016-10-23 DIAGNOSIS — I742 Embolism and thrombosis of arteries of the upper extremities: Secondary | ICD-10-CM | POA: Diagnosis not present

## 2016-10-23 DIAGNOSIS — I10 Essential (primary) hypertension: Secondary | ICD-10-CM | POA: Diagnosis not present

## 2016-10-23 DIAGNOSIS — Z9189 Other specified personal risk factors, not elsewhere classified: Secondary | ICD-10-CM | POA: Diagnosis not present

## 2016-10-23 DIAGNOSIS — I350 Nonrheumatic aortic (valve) stenosis: Secondary | ICD-10-CM | POA: Diagnosis not present

## 2016-10-23 DIAGNOSIS — E782 Mixed hyperlipidemia: Secondary | ICD-10-CM | POA: Diagnosis not present

## 2016-10-26 DIAGNOSIS — I69354 Hemiplegia and hemiparesis following cerebral infarction affecting left non-dominant side: Secondary | ICD-10-CM | POA: Diagnosis not present

## 2016-10-26 DIAGNOSIS — Z48812 Encounter for surgical aftercare following surgery on the circulatory system: Secondary | ICD-10-CM | POA: Diagnosis not present

## 2016-10-26 DIAGNOSIS — I10 Essential (primary) hypertension: Secondary | ICD-10-CM | POA: Diagnosis not present

## 2016-10-26 DIAGNOSIS — I484 Atypical atrial flutter: Secondary | ICD-10-CM | POA: Diagnosis not present

## 2016-10-26 DIAGNOSIS — I742 Embolism and thrombosis of arteries of the upper extremities: Secondary | ICD-10-CM | POA: Diagnosis not present

## 2016-10-26 DIAGNOSIS — I350 Nonrheumatic aortic (valve) stenosis: Secondary | ICD-10-CM | POA: Diagnosis not present

## 2016-10-28 ENCOUNTER — Encounter: Payer: Medicare Other | Admitting: Internal Medicine

## 2016-10-28 DIAGNOSIS — I742 Embolism and thrombosis of arteries of the upper extremities: Secondary | ICD-10-CM | POA: Diagnosis not present

## 2016-10-28 DIAGNOSIS — I484 Atypical atrial flutter: Secondary | ICD-10-CM | POA: Diagnosis not present

## 2016-10-28 DIAGNOSIS — I350 Nonrheumatic aortic (valve) stenosis: Secondary | ICD-10-CM | POA: Diagnosis not present

## 2016-10-28 DIAGNOSIS — E039 Hypothyroidism, unspecified: Secondary | ICD-10-CM | POA: Diagnosis not present

## 2016-10-28 DIAGNOSIS — I482 Chronic atrial fibrillation: Secondary | ICD-10-CM | POA: Diagnosis not present

## 2016-10-28 DIAGNOSIS — E782 Mixed hyperlipidemia: Secondary | ICD-10-CM | POA: Diagnosis not present

## 2016-10-28 DIAGNOSIS — Z48812 Encounter for surgical aftercare following surgery on the circulatory system: Secondary | ICD-10-CM | POA: Diagnosis not present

## 2016-10-28 DIAGNOSIS — M545 Low back pain: Secondary | ICD-10-CM | POA: Diagnosis not present

## 2016-10-28 DIAGNOSIS — I8391 Asymptomatic varicose veins of right lower extremity: Secondary | ICD-10-CM | POA: Diagnosis not present

## 2016-10-28 DIAGNOSIS — I10 Essential (primary) hypertension: Secondary | ICD-10-CM | POA: Diagnosis not present

## 2016-10-28 DIAGNOSIS — Z23 Encounter for immunization: Secondary | ICD-10-CM | POA: Diagnosis not present

## 2016-10-28 DIAGNOSIS — I69354 Hemiplegia and hemiparesis following cerebral infarction affecting left non-dominant side: Secondary | ICD-10-CM | POA: Diagnosis not present

## 2016-11-03 DIAGNOSIS — I350 Nonrheumatic aortic (valve) stenosis: Secondary | ICD-10-CM | POA: Diagnosis not present

## 2016-11-03 DIAGNOSIS — I69354 Hemiplegia and hemiparesis following cerebral infarction affecting left non-dominant side: Secondary | ICD-10-CM | POA: Diagnosis not present

## 2016-11-03 DIAGNOSIS — I10 Essential (primary) hypertension: Secondary | ICD-10-CM | POA: Diagnosis not present

## 2016-11-03 DIAGNOSIS — I484 Atypical atrial flutter: Secondary | ICD-10-CM | POA: Diagnosis not present

## 2016-11-03 DIAGNOSIS — Z48812 Encounter for surgical aftercare following surgery on the circulatory system: Secondary | ICD-10-CM | POA: Diagnosis not present

## 2016-11-03 DIAGNOSIS — I742 Embolism and thrombosis of arteries of the upper extremities: Secondary | ICD-10-CM | POA: Diagnosis not present

## 2016-11-05 ENCOUNTER — Ambulatory Visit (INDEPENDENT_AMBULATORY_CARE_PROVIDER_SITE_OTHER): Payer: Medicare Other | Admitting: *Deleted

## 2016-11-05 DIAGNOSIS — R55 Syncope and collapse: Secondary | ICD-10-CM

## 2016-11-06 NOTE — Progress Notes (Signed)
Carelink Summary Report / Loop Recorder 

## 2016-11-07 LAB — CUP PACEART REMOTE DEVICE CHECK
Date Time Interrogation Session: 20180306143928
MDC IDC PG IMPLANT DT: 20170609

## 2016-11-11 DIAGNOSIS — K641 Second degree hemorrhoids: Secondary | ICD-10-CM | POA: Diagnosis not present

## 2016-11-12 ENCOUNTER — Ambulatory Visit (INDEPENDENT_AMBULATORY_CARE_PROVIDER_SITE_OTHER): Payer: Medicare Other | Admitting: Cardiology

## 2016-11-12 ENCOUNTER — Encounter: Payer: Self-pay | Admitting: Cardiology

## 2016-11-12 VITALS — BP 130/64 | HR 64 | Ht 63.0 in | Wt 145.0 lb

## 2016-11-12 DIAGNOSIS — Z87898 Personal history of other specified conditions: Secondary | ICD-10-CM

## 2016-11-12 DIAGNOSIS — I10 Essential (primary) hypertension: Secondary | ICD-10-CM | POA: Diagnosis not present

## 2016-11-12 DIAGNOSIS — I350 Nonrheumatic aortic (valve) stenosis: Secondary | ICD-10-CM | POA: Diagnosis not present

## 2016-11-12 DIAGNOSIS — I484 Atypical atrial flutter: Secondary | ICD-10-CM | POA: Diagnosis not present

## 2016-11-12 DIAGNOSIS — I749 Embolism and thrombosis of unspecified artery: Secondary | ICD-10-CM

## 2016-11-12 NOTE — Patient Instructions (Signed)
Medication Instructions:  Your physician recommends that you continue on your current medications as directed. Please refer to the Current Medication list given to you today.  Labwork: NONE  Testing/Procedures: NONE  Follow-Up: Your physician recommends that you schedule a follow-up appointment in: 3 MONTHS WITH DR. MCDOWELL  Any Other Special Instructions Will Be Listed Below (If Applicable).  If you need a refill on your cardiac medications before your next appointment, please call your pharmacy. [place]!

## 2016-11-12 NOTE — Progress Notes (Signed)
Cardiology Office Note  Date: 11/12/2016   ID: [name], DOB 17-Feb-1922, MRN [id number]  PCP: Gar Ponto, MD  Primary Cardiologist: Rozann Lesches, MD   Chief Complaint  Patient presents with  . Aortic Stenosis  . Atrial arrhythmias    History of Present Illness: Christine Pugh is a 83 y.o. female last seen in the office by Ms. Dunn PA-C in March. I reviewed extensive interval records since my last encounter with her with hospitalization summarized in the previous note from March. She is here today with a health care assistant and also her son. Despite her prior decline secondary to stroke and left arm arterial thrombosis with embolectomy back in January, she has made significant improvement and is feeling nearly back to prior baseline. She still has dyspnea on exertion which is relatively mild with basic activities, feels more energy. She does not report any palpitations and has had no bleeding problems on Eliquis. She has not had any syncope.  Recent implantable loop recorder interrogation showed no specific arrhythmias or pauses. She will follow-up with Dr. Rayann Heman later this month.  I reviewed her current medications. Cardiac regimen includes amiodarone, Eliquis, Dyazide, and Toprol-XL. She is on relatively low doses and her blood pressure has been stable.  I reviewed her echocardiogram from January which is outlined below.  Past Medical History:  Diagnosis Date  . Aortic insufficiency    Mild  . Aortic stenosis    Moderate to severe  . 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  s/p Left brachial, radial, ulnar, axillary embolectomy 08/2016.  Marland Kitchen Atypical atrial flutter ([place])   . Embolic stroke involving right cerebellar artery ([place])   . Essential hypertension   . Hypothyroidism   . Ischemia of extremity   . Moderate to severe aortic stenosis   . PAF (paroxysmal atrial fibrillation) ([place])   . Paroxysmal atrial tachycardia ([place])   . Stroke ([place])  08/2016  . Syncope    a. s/p ILR 01/2016.    Past Surgical History:  Procedure Laterality Date  . APPENDECTOMY    . BREAST LUMPECTOMY    . dental implant    . EP IMPLANTABLE DEVICE N/A 01/10/2016   Procedure: Loop Recorder Insertion;  Surgeon: [name], MD;  Location: [facility];  Service: Cardiovascular;  Laterality: N/A;  . IR GENERIC HISTORICAL  08/23/2016   IR US GUIDE VASC ACCESS RIGHT 08/23/2016 Christine Mckusick, DO [facility]  . IR GENERIC HISTORICAL  08/23/2016   IR PERCUTANEOUS ART THROMBECTOMY/INFUSION INTRACRANIAL INC DIAG ANGIO 08/23/2016 Christine Mckusick, DO [facility]  . IR GENERIC HISTORICAL  08/23/2016   IR ANGIO INTRA EXTRACRAN SEL COM CAROTID INNOMINATE UNI L MOD SED 08/23/2016 Christine Mckusick, DO [facility]  . RADIOLOGY WITH ANESTHESIA Bilateral 08/23/2016   Procedure: RADIOLOGY WITH ANESTHESIA;  Surgeon: Medication Radiologist, MD;  Location: [facility];  Service: Radiology;  Laterality: Bilateral;  . THROMBECTOMY BRACHIAL ARTERY Left 08/23/2016   Procedure: Left Radial, Ulnar, Brachial and Axillary Embolectomy;  Surgeon: Elam Dutch, MD;  Location: [facility];  Service: Vascular;  Laterality: Left;  . THYROIDECTOMY    . TONSILLECTOMY    . TOTAL ABDOMINAL HYSTERECTOMY    . VITRECTOMY AND CATARACT      Current Outpatient Prescriptions  Medication Sig Dispense Refill  . amiodarone (PACERONE) 100 MG tablet Take 0.5 tablets (50 mg total) by mouth daily. 30 tablet 0  . apixaban (ELIQUIS) 5 MG TABS tablet Take 1 tablet (  5 mg total) by mouth 2 (two) times daily. 180 tablet 3  . hydrocortisone (ANUSOL-HC) 25 MG suppository Place 1 suppository (25 mg total) rectally 2 (two) times daily. 6 suppository 0  . levothyroxine (SYNTHROID, LEVOTHROID) 112 MCG tablet Take 1 tablet (112 mcg total) by mouth daily before breakfast. 30 tablet 0  . Lutein-Zeaxanthin 25-5 MG CAPS Take 1 capsule by mouth 2 (two) times daily.    . metoprolol succinate (TOPROL XL) 25 MG 24 hr tablet Take 0.5  tablets (12.5 mg total) by mouth 2 (two) times daily. 60 tablet 6  . Multiple Vitamin (MULTIVITAMIN) tablet Take 1 tablet by mouth daily.      Marland Kitchen triamcinolone cream (KENALOG) 0.1 % Apply 1 application topically daily as needed (irritated skin).     Marland Kitchen triamterene-hydrochlorothiazide (DYAZIDE) 37.5-25 MG capsule Take 1 each (1 capsule total) by mouth daily. 30 capsule 0   No current facility-administered medications for this visit.    Allergies:  Patient has no known allergies.   Social History: The patient  reports that she has never smoked. She has never used smokeless tobacco. She reports that she does not drink alcohol or use drugs.   ROS:  Please see the history of present illness. Otherwise, complete review of systems is positive for hearing loss. Appetite is fair. All other systems are reviewed and negative.   Physical Exam: VS:  BP 130/64   Pulse 64   Ht 5\' 3"  (1.6 m)   Wt 145 lb (65.8 kg)   SpO2 99%   BMI 25.69 kg/m , BMI Body mass index is 25.69 kg/m.  Wt Readings from Last 3 Encounters:  11/12/16 145 lb (65.8 kg)  10/08/16 143 lb (64.9 kg)  09/17/16 150 lb (68 kg)    Elderly woman, appears comfortable at rest. HEENT: Conjunctiva and lids normal, oropharynx clear.  Neck: Supple, no carotid bruits, no thyromegaly.  Lungs: Clear to auscultation, nonlabored.  Cardiac: RRR today, 3/6 systolic murmur heard best at the base, no loud diastolic murmur, no S3.  Abdomen: Soft, nontender.  Extremities: No leg edema, distal pulses 2+.  Neuropsychiatric: Alert and oriented 3, affect appropriate. Skin: Warm and dry. Musculoskeletal: Kyphosis. Neuropsychiatric: Alert and oriented 3, affect appropriate.  ECG: I personally reviewed the tracing from 08/23/2016 which showed sinus rhythm with prolonged PR interval, left anterior fascicular block, nonspecific T-wave abnormalities.  Recent Labwork: 08/29/2016: ALT 22; AST 39 09/01/2016: Magnesium 1.7 09/03/2016: Hemoglobin 11.6;  Platelets 416 09/04/2016: BUN 23; Creatinine, Ser 0.89; Potassium 3.6; Sodium 137     Component Value Date/Time   CHOL 164 08/25/2016 0331   TRIG 69 08/25/2016 0331   HDL 56 08/25/2016 0331   CHOLHDL 2.9 08/25/2016 0331   VLDL 14 08/25/2016 0331   LDLCALC 94 08/25/2016 0331    Other Studies Reviewed Today:  Echocardiogram 08/24/2016: Study Conclusions  - Left ventricle: The cavity size was normal. There was mild   concentric hypertrophy. Systolic function was vigorous. The   estimated ejection fraction was in the range of 65% to 70%. Wall   motion was normal; there were no regional wall motion   abnormalities. Features are consistent with a pseudonormal left   ventricular filling pattern, with concomitant abnormal relaxation   and increased filling pressure (grade 2 diastolic dysfunction).   Doppler parameters are consistent with elevated ventricular   end-diastolic filling pressure. - Aortic valve: There was moderate stenosis. There was mild   regurgitation. Mean gradient (S): 44 mm Hg. Peak gradient (S): 74  mm Hg. Valve area (VTI): 0.64 cm^2. Valve area (Vmax): 0.59 cm^2.   Valve area (Vmean): 0.65 cm^2. - Aortic root: The aortic root was normal in size. - Mitral valve: There was mild regurgitation. - Left atrium: The atrium was moderately dilated. - Right ventricle: Systolic function was normal. - Tricuspid valve: There was mild regurgitation. - Pulmonary arteries: Systolic pressure was mildly increased. PA   peak pressure: 33 mm Hg (S). - Inferior vena cava: The vessel was normal in size. - Pericardium, extracardiac: There was no pericardial effusion.  Impressions:  - Severe aortic stenosis.  Assessment and Plan:  1. Severe low gradient aortic stenosis and mild aortic regurgitation. She is relatively stable from a symptom perspective and continues to prefer overall conservative approach. She has been seen by Dr. Burt Knack in the past for TAVR evaluation, she is not  inclined to pursue intervention at this point.  2. Paroxysmal atrial flutter and fibrillation. She had declined anticoagulation for quite some time, however is now on Eliquis following stroke and left arm arterial embolic event. She is doing well without any bleeding problems. I reviewed her most recent lab work. Heart rate is regular today. She remains on low-dose amiodarone and Toprol-XL.  3. History of syncope, etiology not certain but the possibility of conduction system disease is considered. She does have an implantable loop recorder in place and follows with Dr. Rayann Heman.  4. Essential hypertension, blood pressure is stable today.  Current medicines were reviewed with the patient today.  Disposition: Follow-up in 3 months.  Signed, Satira Sark, MD, [facility] 11/12/2016 11:51 AM    [facility] [place], [place], [place] [address] Phone: [phone]; Fax: [phone]

## 2016-11-14 LAB — CUP PACEART REMOTE DEVICE CHECK
Date Time Interrogation Session: 20180405143901
Implantable Pulse Generator Implant Date: 20170609

## 2016-11-14 NOTE — Progress Notes (Signed)
Carelink summary report received. Battery status OK. Normal device function. No new symptom episodes, tachy episodes, brady, or pause episodes. No new AF episodes. Monthly summary reports and ROV/PRN 

## 2016-11-23 DIAGNOSIS — H353131 Nonexudative age-related macular degeneration, bilateral, early dry stage: Secondary | ICD-10-CM | POA: Diagnosis not present

## 2016-11-23 DIAGNOSIS — H35362 Drusen (degenerative) of macula, left eye: Secondary | ICD-10-CM | POA: Diagnosis not present

## 2016-11-23 DIAGNOSIS — H353222 Exudative age-related macular degeneration, left eye, with inactive choroidal neovascularization: Secondary | ICD-10-CM | POA: Diagnosis not present

## 2016-11-23 DIAGNOSIS — G453 Amaurosis fugax: Secondary | ICD-10-CM | POA: Diagnosis not present

## 2016-11-24 ENCOUNTER — Ambulatory Visit (INDEPENDENT_AMBULATORY_CARE_PROVIDER_SITE_OTHER): Payer: Medicare Other | Admitting: Internal Medicine

## 2016-11-24 ENCOUNTER — Encounter: Payer: Self-pay | Admitting: Internal Medicine

## 2016-11-24 VITALS — BP 128/83 | HR 60 | Ht 64.0 in | Wt 143.0 lb

## 2016-11-24 DIAGNOSIS — I484 Atypical atrial flutter: Secondary | ICD-10-CM | POA: Diagnosis not present

## 2016-11-24 DIAGNOSIS — Z87898 Personal history of other specified conditions: Secondary | ICD-10-CM | POA: Diagnosis not present

## 2016-11-24 DIAGNOSIS — I480 Paroxysmal atrial fibrillation: Secondary | ICD-10-CM

## 2016-11-24 DIAGNOSIS — I10 Essential (primary) hypertension: Secondary | ICD-10-CM | POA: Diagnosis not present

## 2016-11-24 DIAGNOSIS — I7090 Unspecified atherosclerosis: Secondary | ICD-10-CM

## 2016-11-24 NOTE — Patient Instructions (Signed)
Medication Instructions:  Continue all current medications.  Labwork: none  Testing/Procedures: none  Follow-Up: Your physician wants you to follow up in:  1 year.  You will receive a reminder letter in the mail one-two months in advance.  If you don't receive a letter, please call our office to schedule the follow up appointment - Dr. Allred.   Any Other Special Instructions Will Be Listed Below (If Applicable). Remote monitoring is used to monitor your Pacemaker of ICD from home. This monitoring reduces the number of office visits required to check your device to one time per year. It allows us to keep an eye on the functioning of your device to ensure it is working properly. You are scheduled for a device check from home on 02/23/2017.  You may send your transmission at any time that day. If you have a wireless device, the transmission will be sent automatically. After your physician reviews your transmission, you will receive a postcard with your next transmission date.  If you need a refill on your cardiac medications before your next appointment, please call your pharmacy.  

## 2016-11-24 NOTE — Progress Notes (Signed)
PCP: Gar Ponto, MD Primary Cardiologist:  Dr Jolinda Croak is a 83 y.o. female who presents today for routine electrophysiology followup.  Since last being seen in our clinic, the patient had a large embolic event off of anticoagulation (which she had previously declined).  She is now on eliquis and doing well.  Her AF is controlled.  She is making slow progress from her stroke.  She has had no further syncope. Today, she denies symptoms of palpitations, chest pain, shortness of breath,  lower extremity edema, dizziness, presyncope, or syncope.  The patient is otherwise without complaint today.   Past Medical History:  Diagnosis Date  . Aortic insufficiency    Mild  . Aortic stenosis    Moderate to severe  . 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  s/p Left brachial, radial, ulnar, axillary embolectomy 08/2016.  Marland Kitchen Atypical atrial flutter ([place])   . Embolic stroke involving right cerebellar artery ([place])   . Essential hypertension   . Hypothyroidism   . Ischemia of extremity   . Moderate to severe aortic stenosis   . PAF (paroxysmal atrial fibrillation) ([place])   . Paroxysmal atrial tachycardia ([place])   . Stroke ([place]) 08/2016  . Syncope    a. s/p ILR 01/2016.   Past Surgical History:  Procedure Laterality Date  . APPENDECTOMY    . BREAST LUMPECTOMY    . dental implant    . EP IMPLANTABLE DEVICE N/A 01/10/2016   Procedure: Loop Recorder Insertion;  Surgeon: [name], MD;  Location: [facility];  Service: Cardiovascular;  Laterality: N/A;  . IR GENERIC HISTORICAL  08/23/2016   IR US GUIDE VASC ACCESS RIGHT 08/23/2016 Corrie Mckusick, DO [facility]  . IR GENERIC HISTORICAL  08/23/2016   IR PERCUTANEOUS ART THROMBECTOMY/INFUSION INTRACRANIAL INC DIAG ANGIO 08/23/2016 Corrie Mckusick, DO [facility]  . IR GENERIC HISTORICAL  08/23/2016   IR ANGIO INTRA EXTRACRAN SEL COM CAROTID INNOMINATE UNI L MOD SED 08/23/2016 Corrie Mckusick, DO [facility]  .  RADIOLOGY WITH ANESTHESIA Bilateral 08/23/2016   Procedure: RADIOLOGY WITH ANESTHESIA;  Surgeon: Medication Radiologist, MD;  Location: [facility];  Service: Radiology;  Laterality: Bilateral;  . THROMBECTOMY BRACHIAL ARTERY Left 08/23/2016   Procedure: Left Radial, Ulnar, Brachial and Axillary Embolectomy;  Surgeon: Elam Dutch, MD;  Location: [facility];  Service: Vascular;  Laterality: Left;  . THYROIDECTOMY    . TONSILLECTOMY    . TOTAL ABDOMINAL HYSTERECTOMY    . VITRECTOMY AND CATARACT      ROS- all systems are reviewed and negatives except as per HPI above  Current Outpatient Prescriptions  Medication Sig Dispense Refill  . amiodarone (PACERONE) 100 MG tablet Take 0.5 tablets (50 mg total) by mouth daily. 30 tablet 0  . apixaban (ELIQUIS) 5 MG TABS tablet Take 1 tablet (5 mg total) by mouth 2 (two) times daily. 180 tablet 3  . hydrocortisone (ANUSOL-HC) 25 MG suppository Place 1 suppository (25 mg total) rectally 2 (two) times daily. 6 suppository 0  . levothyroxine (SYNTHROID, LEVOTHROID) 112 MCG tablet Take 1 tablet (112 mcg total) by mouth daily before breakfast. 30 tablet 0  . Lutein-Zeaxanthin 25-5 MG CAPS Take 1 capsule by mouth 2 (two) times daily.    . metoprolol succinate (TOPROL XL) 25 MG 24 hr tablet Take 0.5 tablets (12.5 mg total) by mouth 2 (two) times daily. 60 tablet 6  . Multiple Vitamin (MULTIVITAMIN) tablet Take 1 tablet by mouth daily.      Marland Kitchen  triamcinolone cream (KENALOG) 0.1 % Apply 1 application topically daily as needed (irritated skin).     Marland Kitchen triamterene-hydrochlorothiazide (DYAZIDE) 37.5-25 MG capsule Take 1 each (1 capsule total) by mouth daily. 30 capsule 0   No current facility-administered medications for this visit.     Physical Exam: Vitals:   11/24/16 1126  Height: 5\' 4"  (1.626 m)    GEN- The patient is elderly appearing, alert and oriented x 3 today.   Head- normocephalic, atraumatic Eyes-  Sclera clear, conjunctiva pink Ears- hearing  intact Oropharynx- clear Lungs- Clear to ausculation bilaterally, normal work of breathing Heart- Regular rate and rhythm, no murmurs, rubs or gallops, PMI not laterally displaced GI- soft, NT, ND, + BS Extremities- no clubbing, cyanosis, or edema  ILR interrogation is personally reviewed today and does not reveal any brady events  Assessment and Plan:  1. Syncope No further episodes Continue ILR monitoring No changes today  2. Afib/ atypical flutter Continue low dose amiodarone  On eliquis for stroke prevention  3. htn Stable No change required today  4. s/p stroke She also reports recent visual changes.  I have advised that she follow-up with Dr Leonie Man (stroke neurology)  [facility] Return to see Dr Domenic Polite as scheduled I will see in 12 months  [name] MD, [facility] 11/24/2016 11:28 AM

## 2016-11-26 ENCOUNTER — Ambulatory Visit (INDEPENDENT_AMBULATORY_CARE_PROVIDER_SITE_OTHER): Payer: Medicare Other | Admitting: Neurology

## 2016-11-26 ENCOUNTER — Encounter: Payer: Self-pay | Admitting: Neurology

## 2016-11-26 VITALS — BP 108/68 | HR 60 | Ht 63.0 in | Wt 143.0 lb

## 2016-11-26 DIAGNOSIS — I63411 Cerebral infarction due to embolism of right middle cerebral artery: Secondary | ICD-10-CM

## 2016-11-26 DIAGNOSIS — I7090 Unspecified atherosclerosis: Secondary | ICD-10-CM

## 2016-11-26 DIAGNOSIS — R413 Other amnesia: Secondary | ICD-10-CM | POA: Diagnosis not present

## 2016-11-26 HISTORY — DX: Other amnesia: R41.3

## 2016-11-26 LAB — CUP PACEART INCLINIC DEVICE CHECK
Date Time Interrogation Session: 20180426135326
MDC IDC PG IMPLANT DT: 20170609

## 2016-11-26 NOTE — Progress Notes (Signed)
[facility] [address] [place]. [place] [address] [phone]       OFFICE FOLLOW-UP NOTE  Christine. Christine Pugh Date of Birth:  27-Nov-1921 Medical Record Number:  [id number]   HPI: Christine Pugh is a 13 year pleasant Caucasian lady seen today for the first office follow-up visit following hospital admission for stroke in January 2018. She is accompanied by her son and caregiver. History is up 10 from the patient, family and review of electronic medical records. I have personally reviewed imaging films.Christine Pugh a 83 y.o.female. transferred from outside hospital due to subacute right cerebellar infarct on CT early this am and decreased vascular perfusion of the left arm due to brachial artery emboli. She has paroxysmal a. Fib. She underwent uneventful embolectomy   and was last seen normal at 1 pm on 08/23/2016. At 2 pm, RN saw patient with mental status changes, right gaze deviation, and left hemiparesis. CT Brain showed no hemorrhage, but old left cerebellar infarct and subacute right cerebellar infarct and right MCA clot visible. . CTA Brain and Neck with perfusion studies showed embolic occlusion of the right M1 segment. Cerebral blood flow less than 30% volume of 67 cc. T-max greater than 6 seconds 294 cc. Mismatch volume 227 cc.She is not an ideal candidate for IV tPA given recent surgery. She underwent emergent mechanical embolectomy using stent retrieval technology and local aspiration with complete revascularization the TICI3 by Dr. Earleen Newport. She was kept in the intensive care unit where blood pressure was tightly controlled. Follow-up CT scan evolutionary changes in the previously noted right cerebellar infarct and edema in the right MCA distribution mostly in insula. She was started on anticoagulation with eliquis and transferred to inpatient rehabilitation where she did well and is currently living at home. She has a caregiver who comes for 5 hours a day but  the patient is mostly independent. Her son lives nearby and keeps a close watch on her. She walks with a cane. She still has some diminished fine motor skills in the left side. She's noticed memory difficulties since her stroke. She has mainly short-term memory difficulties. She has trouble details of hospitalization. She is tolerating liquids well with only minor bruising and no major bleeding episodes.   ROS:   14 system review of systems is positive for  weight loss, murmur, blurred vision, shortness of breath, easy bruising, cold, memory loss and all other systems negative  PMH:  Past Medical History:  Diagnosis Date  . Aortic insufficiency    Mild  . Aortic stenosis    Moderate to severe  . 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  s/p Left brachial, radial, ulnar, axillary embolectomy 08/2016.  Marland Kitchen Atypical atrial flutter ([place])   . Embolic stroke involving right cerebellar artery ([place])   . Essential hypertension   . Hypothyroidism   . Ischemia of extremity   . Memory loss 11/26/2016  . Moderate to severe aortic stenosis   . PAF (paroxysmal atrial fibrillation) ([place])   . Paroxysmal atrial tachycardia ([place])   . Stroke ([place]) 08/2016  . Syncope    a. s/p ILR 01/2016.    Social History:  Social History   Social History  . Marital status: Widowed    Spouse name: N/A  . Number of children: N/A  . Years of education: N/A   Occupational History  . Retired    Social History Main Topics  . Smoking status: Never Smoker  . Smokeless tobacco:  Never Used  . Alcohol use No  . Drug use: No  . Sexual activity: Not on file   Other Topics Concern  . Not on file   Social History Narrative   Pt lives in [place] alone.   Widowed   Retired Network engineer    Medications:   Current Outpatient Prescriptions on File Prior to Visit  Medication Sig Dispense Refill  . amiodarone (PACERONE) 100 MG tablet Take 0.5 tablets (50 mg total) by mouth daily. 30 tablet 0  . apixaban  (ELIQUIS) 5 MG TABS tablet Take 1 tablet (5 mg total) by mouth 2 (two) times daily. 180 tablet 3  . hydrocortisone (ANUSOL-HC) 25 MG suppository Place 1 suppository (25 mg total) rectally 2 (two) times daily. 6 suppository 0  . levothyroxine (SYNTHROID, LEVOTHROID) 112 MCG tablet Take 1 tablet (112 mcg total) by mouth daily before breakfast. 30 tablet 0  . Lutein-Zeaxanthin 25-5 MG CAPS Take 1 capsule by mouth 2 (two) times daily.    . metoprolol succinate (TOPROL XL) 25 MG 24 hr tablet Take 0.5 tablets (12.5 mg total) by mouth 2 (two) times daily. 60 tablet 6  . Multiple Vitamin (MULTIVITAMIN) tablet Take 1 tablet by mouth daily.      Marland Kitchen triamcinolone cream (KENALOG) 0.1 % Apply 1 application topically daily as needed (irritated skin).     Marland Kitchen triamterene-hydrochlorothiazide (DYAZIDE) 37.5-25 MG capsule Take 1 each (1 capsule total) by mouth daily. 30 capsule 0   No current facility-administered medications on file prior to visit.     Allergies:  No Known Allergies  Physical Exam General: Frail elderly Caucasian, seated, in no evident distress Head: head normocephalic and atraumatic.  Neck: supple with no carotid or supraclavicular bruits Cardiovascular: regular rate and rhythm, no murmurs Musculoskeletal: no deformity Skin:  no rash/petichiae Vascular:  Normal pulses all extremities Vitals:   11/26/16 0916  BP: 108/68  Pulse: 60   Neurologic Exam Mental Status: Awake and fully alert. Oriented to place and time. Recent and remote memory poor but   Recall 3/3.Marland Kitchen Attention span, concentration and fund of knowledge diminished. Mood and affect appropriate.  Cranial Nerves: Fundoscopic exam reveals sharp disc margins. Pupils equal, briskly reactive to light. Extraocular movements full without nystagmus. Visual fields full to confrontation. Hearing intact. Facial sensation intact. Face, tongue, palate moves normally and symmetrically.  Motor: Normal bulk and tone. Normal strength in all tested  extremity muscles. Diminished fine finger movements on the left. Mild weakness of left grip. Orbits right over left upper extremity. Mild weakness of left ankle dorsiflexors and hip flexors. Sensory.: intact to touch ,pinprick .position and vibratory sensation.  Coordination: Rapid alternating movements normal in all extremities. Finger-to-nose and heel-to-shin performed accurately bilaterally. Gait and Station: Arises from chair without difficulty. Stance is stooped. Gait demonstrates  short strides  Slight imbalance and uses cane ] left leg slightly . Not able to heel, toe and tandem walk without difficulty.  Reflexes: 1+ and symmetric. Toes downgoing.   NIHSS  2 Modified Rankin  3   ASSESSMENT: 83 year old Caucasian lady with embolic right MCA infarct in January 2018 from atrial fibrillation status post mechanical embolectomy with recanalization was doing extremely well.   PLAN: I had a long d/w patient, son and caregiver about her recent  embolic stroke, atrial fibrillation, poststroke memory loss, risk for recurrent stroke/TIAs, personally independently reviewed imaging studies and stroke evaluation results and answered questions.Continue Eliquis (apixaban) daily  for secondary stroke prevention and maintain strict control of hypertension with  blood pressure goal below 130/90, diabetes with hemoglobin A1c goal below 6.5% and lipids with LDL cholesterol goal below 70 mg/dL. I also advised the patient to eat a healthy diet with plenty of whole grains, cereals, fruits and vegetables, exercise regularly and maintain ideal body weight .check MRI scan of the brain, EEG and memory panel labs. The patient wants to drive I would recommend referral to drive and rehabilitation services she is safe to drive.she also asked about doing some dental work on implants. I think it is not safe to hold eliquis for the procedure as risk for recurrent stroke with maximum the first 6 months .We also discussed memory  compensation strategies Followup in the future with me in 3 months or call earlier if necessary Greater than 50% of time during this 35 minute visit was spent on counseling,explanation of diagnosis, planning of further management, discussion with patient and family and coordination of care Antony Contras, MD  [facility] [address] [place], [place] [address]  Phone [phone] Fax [phone] Note: This document was prepared with digital dictation and possible smart phrase technology. Any transcriptional errors that result from this process are unintentional

## 2016-11-26 NOTE — Patient Instructions (Addendum)
I had a long d/w patient, son and caregiver about her recent  Embolic stroke, atrial fibrillation, poststroke memory loss, risk for recurrent stroke/TIAs, personally independently reviewed imaging studies and stroke evaluation results and answered questions.Continue Eliquis (apixaban) daily  for secondary stroke prevention and maintain strict control of hypertension with blood pressure goal below 130/90, diabetes with hemoglobin A1c goal below 6.5% and lipids with LDL cholesterol goal below 70 mg/dL. I also advised the patient to eat a healthy diet with plenty of whole grains, cereals, fruits and vegetables, exercise regularly and maintain ideal body weight .check MRI scan of the brain, EEG and memory panel labs. The patient wants to drive I would recommend referral to drive and rehabilitation services she is safe to drive.she also asked about doing some dental work on implants. I think it is not safe to hold eliquis for the procedure as risk for recurrent stroke with maximum the first 6 months .We also discussed memory compensation strategies Followup in the future with me in 3 months or call earlier if necessary  Memory Compensation Strategies  1. Use "WARM" strategy.  W= write it down  A= associate it  R= repeat it  M= make a mental note  2.   You can keep a Social worker.  Use a 3-ring notebook with sections for the following: calendar, important names and phone numbers,  medications, doctors' names/phone numbers, lists/reminders, and a section to journal what you did  each day.   3.    Use a calendar to write appointments down.  4.    Write yourself a schedule for the day.  This can be placed on the calendar or in a separate section of the Memory Notebook.  Keeping a  regular schedule can help memory.  5.    Use medication organizer with sections for each day or morning/evening pills.  You may need help loading it  6.    Keep a basket, or pegboard by the door.  Place items that you need  to take out with you in the basket or on the pegboard.  You may also want to  include a message board for reminders.  7.    Use sticky notes.  Place sticky notes with reminders in a place where the task is performed.  For example: " turn off the  stove" placed by the stove, "lock the door" placed on the door at eye level, " take your medications" on  the bathroom mirror or by the place where you normally take your medications.  8.    Use alarms/timers.  Use while cooking to remind yourself to check on food or as a reminder to take your medicine, or as a  reminder to make a call, or as a reminder to perform another task, etc.

## 2016-11-27 ENCOUNTER — Encounter: Payer: Self-pay | Admitting: Neurology

## 2016-11-27 LAB — DEMENTIA PANEL
HOMOCYSTEINE: 14.8 umol/L (ref 0.0–15.0)
RPR: NONREACTIVE
TSH: 1.47 u[IU]/mL (ref 0.450–4.500)
Vitamin B-12: 290 pg/mL (ref 232–1245)

## 2016-11-30 ENCOUNTER — Encounter: Payer: Self-pay | Admitting: Neurology

## 2016-11-30 ENCOUNTER — Telehealth: Payer: Self-pay

## 2016-11-30 NOTE — Telephone Encounter (Signed)
-----   Message from Garvin Fila, MD sent at 11/28/2016  6:13 PM EDT ----- Christine Pugh inform patient that memory panel lab work was normal

## 2016-11-30 NOTE — Telephone Encounter (Signed)
Notes recorded by Marval Regal, RN on 11/30/2016 at 8:54 AM EDT Rn call patients son that his moms dementia panel was normal. PTs son verbalized understanding. ------

## 2016-12-02 NOTE — Patient Outreach (Signed)
[facility]) Care Management  12/02/2016  Christine Pugh [birth year] [id number]   mRS successfully obtained by Dr. Leonie Man on 11/26/2016. mRS = 3

## 2016-12-03 ENCOUNTER — Ambulatory Visit (INDEPENDENT_AMBULATORY_CARE_PROVIDER_SITE_OTHER): Payer: Medicare Other

## 2016-12-03 DIAGNOSIS — R413 Other amnesia: Secondary | ICD-10-CM

## 2016-12-03 DIAGNOSIS — R410 Disorientation, unspecified: Secondary | ICD-10-CM

## 2016-12-04 ENCOUNTER — Encounter: Payer: Medicare Other | Admitting: Internal Medicine

## 2016-12-07 ENCOUNTER — Ambulatory Visit (INDEPENDENT_AMBULATORY_CARE_PROVIDER_SITE_OTHER): Payer: Medicare Other | Admitting: *Deleted

## 2016-12-07 DIAGNOSIS — R55 Syncope and collapse: Secondary | ICD-10-CM | POA: Diagnosis not present

## 2016-12-08 NOTE — Progress Notes (Signed)
Carelink Summary Report / Loop Recorder 

## 2016-12-10 ENCOUNTER — Other Ambulatory Visit: Payer: Medicare Other

## 2016-12-14 ENCOUNTER — Encounter: Payer: Self-pay | Admitting: Neurology

## 2016-12-15 ENCOUNTER — Telehealth: Payer: Self-pay

## 2016-12-15 NOTE — Telephone Encounter (Signed)
LEft vm for patients son on dpr. Rn  Left  vm to call back for eeg results.

## 2016-12-15 NOTE — Telephone Encounter (Signed)
-----   Message from Garvin Fila, MD sent at 12/14/2016  6:29 PM EDT ----- Christine Pugh inform the patient that EEG study showed mild generalized slowing of brain activity which may be age-related. No definite seizure activity or worrisome findings

## 2016-12-17 DIAGNOSIS — I8391 Asymptomatic varicose veins of right lower extremity: Secondary | ICD-10-CM | POA: Diagnosis not present

## 2016-12-17 DIAGNOSIS — I10 Essential (primary) hypertension: Secondary | ICD-10-CM | POA: Diagnosis not present

## 2016-12-17 DIAGNOSIS — Z1389 Encounter for screening for other disorder: Secondary | ICD-10-CM | POA: Diagnosis not present

## 2016-12-17 DIAGNOSIS — I482 Chronic atrial fibrillation: Secondary | ICD-10-CM | POA: Diagnosis not present

## 2016-12-17 DIAGNOSIS — I350 Nonrheumatic aortic (valve) stenosis: Secondary | ICD-10-CM | POA: Diagnosis not present

## 2016-12-17 DIAGNOSIS — E039 Hypothyroidism, unspecified: Secondary | ICD-10-CM | POA: Diagnosis not present

## 2016-12-17 DIAGNOSIS — E782 Mixed hyperlipidemia: Secondary | ICD-10-CM | POA: Diagnosis not present

## 2016-12-17 DIAGNOSIS — K640 First degree hemorrhoids: Secondary | ICD-10-CM | POA: Diagnosis not present

## 2016-12-18 ENCOUNTER — Inpatient Hospital Stay: Admission: RE | Admit: 2016-12-18 | Payer: Medicare Other | Source: Ambulatory Visit

## 2016-12-21 LAB — CUP PACEART REMOTE DEVICE CHECK
MDC IDC PG IMPLANT DT: 20170609
MDC IDC SESS DTM: 20180505153801

## 2016-12-22 NOTE — Telephone Encounter (Signed)
Rn call patients son on dpr that the EEG showed mild generalized slowing of the brain activity, which may be age related. No definite seizure or worrisome findings. Pts son verbalized understanding. Pts son stated his mom is doing her own mychart messages.

## 2016-12-23 DIAGNOSIS — K641 Second degree hemorrhoids: Secondary | ICD-10-CM | POA: Diagnosis not present

## 2017-01-04 ENCOUNTER — Ambulatory Visit (INDEPENDENT_AMBULATORY_CARE_PROVIDER_SITE_OTHER): Payer: Medicare Other | Admitting: *Deleted

## 2017-01-04 DIAGNOSIS — R55 Syncope and collapse: Secondary | ICD-10-CM | POA: Diagnosis not present

## 2017-01-05 NOTE — Progress Notes (Signed)
Carelink Summary Report / Loop Recorder 

## 2017-01-06 LAB — CUP PACEART REMOTE DEVICE CHECK
Date Time Interrogation Session: 20180604164545
Implantable Pulse Generator Implant Date: 20170609

## 2017-01-09 DIAGNOSIS — Z6825 Body mass index (BMI) 25.0-25.9, adult: Secondary | ICD-10-CM | POA: Diagnosis not present

## 2017-01-09 DIAGNOSIS — L03115 Cellulitis of right lower limb: Secondary | ICD-10-CM | POA: Diagnosis not present

## 2017-02-04 ENCOUNTER — Ambulatory Visit (INDEPENDENT_AMBULATORY_CARE_PROVIDER_SITE_OTHER): Payer: Medicare Other | Admitting: *Deleted

## 2017-02-04 DIAGNOSIS — R55 Syncope and collapse: Secondary | ICD-10-CM

## 2017-02-05 NOTE — Progress Notes (Signed)
Carelink Summary Report / Loop Recorder 

## 2017-02-08 LAB — CUP PACEART REMOTE DEVICE CHECK
Date Time Interrogation Session: 20180704160929
MDC IDC PG IMPLANT DT: 20170609

## 2017-02-11 ENCOUNTER — Encounter: Payer: Self-pay | Admitting: *Deleted

## 2017-02-11 NOTE — Progress Notes (Signed)
Cardiology Office Note  Date: 02/12/2017   ID: Christine Pugh, DOB 1921/10/28, MRN [id number]  PCP: Caryl Bis, MD  Primary Cardiologist: Rozann Lesches, MD   Chief Complaint  Patient presents with  . Aortic Stenosis  . Atrial fibrillation/flutter    History of Present Illness: Christine Pugh is a 83 y.o. female last seen in April. She is here today with an assistant for a routine follow-up visit. She does not report any palpitations, no dizziness or frank syncope. She has been sizing 3 days a week, in the pool for about 20 minutes at the [facility] She seems to be satisfied with her progress. Leg weakness is improving.  She continues to follow in the device clinic with Dr. Rayann Heman, ILR in place. Recent interrogation showed normal device function with no tachycardia or bradycardia episodes.  She continues on Eliquis with history of stroke and left arm arterial embolic event. She does not report any spontaneous bleeding problems.  I reviewed her medications which are outlined below. She continues on low-dose amiodarone and had TSH and LFTs within the last 6 months as detailed below.  Past Medical History:  Diagnosis Date  . Aortic insufficiency    Mild  . Aortic stenosis    Moderate to severe  . 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  s/p Left brachial, radial, ulnar, axillary embolectomy 08/2016.  Marland Kitchen Atypical atrial flutter ([place])   . Embolic stroke involving right cerebellar artery ([place])   . Essential hypertension   . Hypothyroidism   . Ischemia of extremity   . Memory loss 11/26/2016  . Moderate to severe aortic stenosis   . PAF (paroxysmal atrial fibrillation) ([place])   . Paroxysmal atrial tachycardia ([place])   . Stroke ([place]) 08/2016  . Syncope    a. s/p ILR 01/2016.    Past Surgical History:  Procedure Laterality Date  . APPENDECTOMY    . BREAST LUMPECTOMY    . dental implant    . EP IMPLANTABLE DEVICE N/A 01/10/2016   Procedure: Loop Recorder  Insertion;  Surgeon: [name], MD;  Location: [place] [facility];  Service: Cardiovascular;  Laterality: N/A;  . IR GENERIC HISTORICAL  08/23/2016   IR US GUIDE VASC ACCESS RIGHT 08/23/2016 Corrie Mckusick, DO [facility]  . IR GENERIC HISTORICAL  08/23/2016   IR PERCUTANEOUS ART THROMBECTOMY/INFUSION INTRACRANIAL INC DIAG ANGIO 08/23/2016 Corrie Mckusick, DO [facility]  . IR GENERIC HISTORICAL  08/23/2016   IR ANGIO INTRA EXTRACRAN SEL COM CAROTID INNOMINATE UNI L MOD SED 08/23/2016 Corrie Mckusick, DO [facility]  . RADIOLOGY WITH ANESTHESIA Bilateral 08/23/2016   Procedure: RADIOLOGY WITH ANESTHESIA;  Surgeon: Medication Radiologist, MD;  Location: [place];  Service: Radiology;  Laterality: Bilateral;  . THROMBECTOMY BRACHIAL ARTERY Left 08/23/2016   Procedure: Left Radial, Ulnar, Brachial and Axillary Embolectomy;  Surgeon: Elam Dutch, MD;  Location: [place];  Service: Vascular;  Laterality: Left;  . THYROIDECTOMY    . TONSILLECTOMY    . TOTAL ABDOMINAL HYSTERECTOMY    . VITRECTOMY AND CATARACT      Current Outpatient Prescriptions  Medication Sig Dispense Refill  . amiodarone (PACERONE) 100 MG tablet Take 0.5 tablets (50 mg total) by mouth daily. 30 tablet 0  . apixaban (ELIQUIS) 5 MG TABS tablet Take 1 tablet (5 mg total) by mouth 2 (two) times daily. 180 tablet 3  . hydrocortisone (ANUSOL-HC) 25 MG suppository Place 1 suppository (25 mg total) rectally 2 (two) times  daily. 6 suppository 0  . levothyroxine (SYNTHROID, LEVOTHROID) 112 MCG tablet Take 1 tablet (112 mcg total) by mouth daily before breakfast. 30 tablet 0  . Lutein-Zeaxanthin 25-5 MG CAPS Take 1 capsule by mouth 2 (two) times daily.    . metoprolol succinate (TOPROL XL) 25 MG 24 hr tablet Take 0.5 tablets (12.5 mg total) by mouth 2 (two) times daily. 60 tablet 6  . Multiple Vitamin (MULTIVITAMIN) tablet Take 1 tablet by mouth daily.      Marland Kitchen triamcinolone cream (KENALOG) 0.1 % Apply 1 application topically daily as needed  (irritated skin).     Marland Kitchen triamterene-hydrochlorothiazide (DYAZIDE) 37.5-25 MG capsule Take 1 each (1 capsule total) by mouth daily. 30 capsule 0   No current facility-administered medications for this visit.    Allergies:  Patient has no known allergies.   Social History: The patient  reports that she has never smoked. She has never used smokeless tobacco. She reports that she does not drink alcohol or use drugs.   ROS:  Please see the history of present illness. Otherwise, complete review of systems is positive for arthritic stiffness, hearing loss.  All other systems are reviewed and negative.   Physical Exam: VS:  BP 138/64   Pulse (!) 57   Ht 5\' 3"  (1.6 m)   Wt 143 lb (64.9 kg)   SpO2 93%   BMI 25.33 kg/m , BMI Body mass index is 25.33 kg/m.  Wt Readings from Last 3 Encounters:  02/12/17 143 lb (64.9 kg)  11/26/16 143 lb (64.9 kg)  11/24/16 143 lb (64.9 kg)    Elderly woman, appears comfortable at rest. HEENT: Conjunctiva and lids normal, oropharynx clear.  Neck: Supple, no carotid bruits, no thyromegaly.  Lungs: Clear to auscultation, nonlabored.  Cardiac: RRR today, 3/6 systolic murmur heard best at the base, no loud diastolic murmur, no S3.  Abdomen: Soft, nontender.  Extremities: Lower leg stasis, distal pulses 2+.  Skin: Few ecchymoses. Musculoskeletal: Kyphosis. Neuropsychiatric: Alert and oriented 3, affect appropriate.  ECG: I personally reviewed the tracing from 11/24/2016 which showed probable sinus rhythm with lead artifact and nonspecific ST-T changes.  Recent Labwork: 08/29/2016: ALT 22; AST 39 09/01/2016: Magnesium 1.7 09/03/2016: Hemoglobin 11.6; Platelets 416 09/04/2016: BUN 23; Creatinine, Ser 0.89; Potassium 3.6; Sodium 137 11/26/2016: TSH 1.470     Component Value Date/Time   CHOL 164 08/25/2016 0331   TRIG 69 08/25/2016 0331   HDL 56 08/25/2016 0331   CHOLHDL 2.9 08/25/2016 0331   VLDL 14 08/25/2016 0331   LDLCALC 94 08/25/2016 0331     Other Studies Reviewed Today:  Echocardiogram 08/24/2016: Study Conclusions  - Left ventricle: The cavity size was normal. There was mild   concentric hypertrophy. Systolic function was vigorous. The   estimated ejection fraction was in the range of 65% to 70%. Wall   motion was normal; there were no regional wall motion   abnormalities. Features are consistent with a pseudonormal left   ventricular filling pattern, with concomitant abnormal relaxation   and increased filling pressure (grade 2 diastolic dysfunction).   Doppler parameters are consistent with elevated ventricular   end-diastolic filling pressure. - Aortic valve: There was moderate stenosis. There was mild   regurgitation. Mean gradient (S): 44 mm Hg. Peak gradient (S): 74   mm Hg. Valve area (VTI): 0.64 cm^2. Valve area (Vmax): 0.59 cm^2.   Valve area (Vmean): 0.65 cm^2. - Aortic root: The aortic root was normal in size. - Mitral valve: There was  mild regurgitation. - Left atrium: The atrium was moderately dilated. - Right ventricle: Systolic function was normal. - Tricuspid valve: There was mild regurgitation. - Pulmonary arteries: Systolic pressure was mildly increased. PA   peak pressure: 33 mm Hg (S). - Inferior vena cava: The vessel was normal in size. - Pericardium, extracardiac: There was no pericardial effusion.  Impressions:  - Severe aortic stenosis.  Assessment and Plan:  1. Severe low gradient aortic stenosis and mild aortic regurgitation. This is being followed conservatively at this point at patient preference. She was seen for TAVR evaluation and declines further intervention.  2. Paroxysmal atrial fibrillation/flutter. She has been in sinus rhythm most recently, denies any palpitations. She remains on very low-dose amiodarone along with Toprol-XL. Continues to tolerate Eliquis as well.  3. History of syncope, ILR in place without any obvious pauses. She continues to follow with Dr.  Rayann Heman.  4. Essential hypertension, blood pressure control is adequate today.  Current medicines were reviewed with the patient today.  Disposition: Follow-up in 4 months.  Signed, Satira Sark, MD, [facility] 02/12/2017 9:30 AM    [facility] [place], [place], [place] [address] Phone: [phone]; Fax: [phone]

## 2017-02-12 ENCOUNTER — Encounter: Payer: Self-pay | Admitting: Cardiology

## 2017-02-12 ENCOUNTER — Ambulatory Visit (INDEPENDENT_AMBULATORY_CARE_PROVIDER_SITE_OTHER): Payer: Medicare Other | Admitting: Cardiology

## 2017-02-12 VITALS — BP 138/64 | HR 57 | Ht 63.0 in | Wt 143.0 lb

## 2017-02-12 DIAGNOSIS — I10 Essential (primary) hypertension: Secondary | ICD-10-CM | POA: Diagnosis not present

## 2017-02-12 DIAGNOSIS — I350 Nonrheumatic aortic (valve) stenosis: Secondary | ICD-10-CM | POA: Diagnosis not present

## 2017-02-12 DIAGNOSIS — Z87898 Personal history of other specified conditions: Secondary | ICD-10-CM

## 2017-02-12 DIAGNOSIS — I480 Paroxysmal atrial fibrillation: Secondary | ICD-10-CM | POA: Diagnosis not present

## 2017-02-12 DIAGNOSIS — I7090 Unspecified atherosclerosis: Secondary | ICD-10-CM

## 2017-02-12 NOTE — Patient Instructions (Addendum)
Medication Instructions:   Your physician recommends that you continue on your current medications as directed. Please refer to the Current Medication list given to you today.  Labwork:  NONE  Testing/Procedures:  NONE  Follow-Up:  Your physician recommends that you schedule a follow-up appointment in: 4 months.  Any Other Special Instructions Will Be Listed Below (If Applicable).  If you need a refill on your cardiac medications before your next appointment, please call your pharmacy. 

## 2017-02-17 ENCOUNTER — Ambulatory Visit: Payer: Medicare Other | Admitting: Neurology

## 2017-02-18 ENCOUNTER — Ambulatory Visit: Payer: Medicare Other | Admitting: Neurology

## 2017-02-18 DIAGNOSIS — L608 Other nail disorders: Secondary | ICD-10-CM | POA: Diagnosis not present

## 2017-02-18 DIAGNOSIS — Z6825 Body mass index (BMI) 25.0-25.9, adult: Secondary | ICD-10-CM | POA: Diagnosis not present

## 2017-02-18 DIAGNOSIS — L299 Pruritus, unspecified: Secondary | ICD-10-CM | POA: Diagnosis not present

## 2017-02-28 ENCOUNTER — Encounter: Payer: Self-pay | Admitting: Cardiology

## 2017-03-05 ENCOUNTER — Ambulatory Visit (INDEPENDENT_AMBULATORY_CARE_PROVIDER_SITE_OTHER): Payer: Medicare Other | Admitting: *Deleted

## 2017-03-05 DIAGNOSIS — R55 Syncope and collapse: Secondary | ICD-10-CM | POA: Diagnosis not present

## 2017-03-09 NOTE — Progress Notes (Signed)
Carelink Summary Report / Loop Recorder 

## 2017-03-11 DIAGNOSIS — Z23 Encounter for immunization: Secondary | ICD-10-CM | POA: Diagnosis not present

## 2017-03-16 LAB — CUP PACEART REMOTE DEVICE CHECK
Date Time Interrogation Session: 20180803164037
MDC IDC PG IMPLANT DT: 20170609

## 2017-03-22 ENCOUNTER — Encounter: Payer: Self-pay | Admitting: Internal Medicine

## 2017-03-24 ENCOUNTER — Other Ambulatory Visit: Payer: Self-pay | Admitting: Cardiology

## 2017-03-24 MED ORDER — AMIODARONE HCL 100 MG PO TABS: 50.0000 mg | ORAL_TABLET | Freq: Every day | ORAL | 1 refills | Status: DC

## 2017-03-24 MED ORDER — APIXABAN 5 MG PO TABS: 5.0000 mg | ORAL_TABLET | Freq: Two times a day (BID) | ORAL | 0 refills | Status: DC

## 2017-03-24 NOTE — Telephone Encounter (Signed)
apixaban (ELIQUIS) 5 MG TABS tablet  Patient is in the dough whole now with her medication.  She would like to know if she can get eliquis samples to get her through the rest of the year

## 2017-03-24 NOTE — Telephone Encounter (Signed)
Pt requesting samples of Eliquis and will pick up tomorrow - explained we could provide some samples for now and she could call back before running out to see if we had additional to provide - also requesting #90 on amiodarone refills since it would be cheaper for her

## 2017-03-26 DIAGNOSIS — E039 Hypothyroidism, unspecified: Secondary | ICD-10-CM | POA: Diagnosis not present

## 2017-03-26 DIAGNOSIS — I10 Essential (primary) hypertension: Secondary | ICD-10-CM | POA: Diagnosis not present

## 2017-03-26 DIAGNOSIS — Z9189 Other specified personal risk factors, not elsewhere classified: Secondary | ICD-10-CM | POA: Diagnosis not present

## 2017-03-26 DIAGNOSIS — I482 Chronic atrial fibrillation: Secondary | ICD-10-CM | POA: Diagnosis not present

## 2017-03-26 DIAGNOSIS — E876 Hypokalemia: Secondary | ICD-10-CM | POA: Diagnosis not present

## 2017-03-26 DIAGNOSIS — I352 Nonrheumatic aortic (valve) stenosis with insufficiency: Secondary | ICD-10-CM | POA: Diagnosis not present

## 2017-03-26 DIAGNOSIS — E782 Mixed hyperlipidemia: Secondary | ICD-10-CM | POA: Diagnosis not present

## 2017-03-30 DIAGNOSIS — I482 Chronic atrial fibrillation: Secondary | ICD-10-CM | POA: Diagnosis not present

## 2017-03-30 DIAGNOSIS — I10 Essential (primary) hypertension: Secondary | ICD-10-CM | POA: Diagnosis not present

## 2017-03-30 DIAGNOSIS — Z0001 Encounter for general adult medical examination with abnormal findings: Secondary | ICD-10-CM | POA: Diagnosis not present

## 2017-03-30 DIAGNOSIS — E039 Hypothyroidism, unspecified: Secondary | ICD-10-CM | POA: Diagnosis not present

## 2017-03-30 DIAGNOSIS — E782 Mixed hyperlipidemia: Secondary | ICD-10-CM | POA: Diagnosis not present

## 2017-03-30 DIAGNOSIS — I350 Nonrheumatic aortic (valve) stenosis: Secondary | ICD-10-CM | POA: Diagnosis not present

## 2017-03-30 DIAGNOSIS — I8391 Asymptomatic varicose veins of right lower extremity: Secondary | ICD-10-CM | POA: Diagnosis not present

## 2017-03-30 DIAGNOSIS — Z6824 Body mass index (BMI) 24.0-24.9, adult: Secondary | ICD-10-CM | POA: Diagnosis not present

## 2017-04-03 ENCOUNTER — Encounter: Payer: Self-pay | Admitting: Internal Medicine

## 2017-04-06 ENCOUNTER — Telehealth: Payer: Self-pay

## 2017-04-06 ENCOUNTER — Ambulatory Visit (INDEPENDENT_AMBULATORY_CARE_PROVIDER_SITE_OTHER): Payer: Medicare Other | Admitting: *Deleted

## 2017-04-06 DIAGNOSIS — R55 Syncope and collapse: Secondary | ICD-10-CM

## 2017-04-06 NOTE — Telephone Encounter (Signed)
Patient notified and verbalized understanding. 

## 2017-04-06 NOTE — Telephone Encounter (Signed)
Patient contacted office stating she just realized she is only supposed to be taking 50 mg of amiodarone daily. Patient states she has been taking 100 mg daily and wants to know if it is still ok to do 50 mg. Patient states she has been doing fine on the 100 mg

## 2017-04-06 NOTE — Telephone Encounter (Signed)
Yes, she can take the amiodarone at 50 mg daily since she has been stable overall.

## 2017-04-07 ENCOUNTER — Encounter: Payer: Self-pay | Admitting: Internal Medicine

## 2017-04-08 NOTE — Progress Notes (Signed)
Carelink Summary Report / Loop Recorder 

## 2017-04-11 LAB — CUP PACEART REMOTE DEVICE CHECK
Date Time Interrogation Session: 20180902174141
MDC IDC PG IMPLANT DT: 20170609

## 2017-04-20 DIAGNOSIS — Z23 Encounter for immunization: Secondary | ICD-10-CM | POA: Diagnosis not present

## 2017-05-04 ENCOUNTER — Ambulatory Visit (INDEPENDENT_AMBULATORY_CARE_PROVIDER_SITE_OTHER): Payer: Medicare Other | Admitting: *Deleted

## 2017-05-04 DIAGNOSIS — R55 Syncope and collapse: Secondary | ICD-10-CM

## 2017-05-05 NOTE — Progress Notes (Signed)
Carelink Summary Report / Loop Recorder 

## 2017-05-06 LAB — CUP PACEART REMOTE DEVICE CHECK
Date Time Interrogation Session: 20181002181037
MDC IDC PG IMPLANT DT: 20170609

## 2017-06-03 ENCOUNTER — Ambulatory Visit (INDEPENDENT_AMBULATORY_CARE_PROVIDER_SITE_OTHER): Payer: Medicare Other | Admitting: *Deleted

## 2017-06-03 DIAGNOSIS — R55 Syncope and collapse: Secondary | ICD-10-CM

## 2017-06-04 NOTE — Progress Notes (Signed)
Carelink Summary Report / Loop Recorder 

## 2017-06-07 LAB — CUP PACEART REMOTE DEVICE CHECK
Implantable Pulse Generator Implant Date: 20170609
MDC IDC SESS DTM: 20181101183927

## 2017-06-22 ENCOUNTER — Ambulatory Visit: Payer: Medicare Other | Admitting: Cardiology

## 2017-06-24 ENCOUNTER — Encounter: Payer: Self-pay | Admitting: Internal Medicine

## 2017-07-01 NOTE — Telephone Encounter (Signed)
Noted.  Monitor is up to date as of 07/01/17.

## 2017-07-05 ENCOUNTER — Ambulatory Visit (INDEPENDENT_AMBULATORY_CARE_PROVIDER_SITE_OTHER): Payer: Medicare Other | Admitting: *Deleted

## 2017-07-05 DIAGNOSIS — R55 Syncope and collapse: Secondary | ICD-10-CM

## 2017-07-05 NOTE — Progress Notes (Signed)
Carelink Summary Report / Loop Recorder 

## 2017-07-13 LAB — CUP PACEART REMOTE DEVICE CHECK
Date Time Interrogation Session: 20181201193847
MDC IDC PG IMPLANT DT: 20170609

## 2017-08-02 ENCOUNTER — Ambulatory Visit (INDEPENDENT_AMBULATORY_CARE_PROVIDER_SITE_OTHER): Payer: Medicare Other | Admitting: *Deleted

## 2017-08-02 DIAGNOSIS — R55 Syncope and collapse: Secondary | ICD-10-CM

## 2017-08-04 ENCOUNTER — Other Ambulatory Visit: Payer: Self-pay

## 2017-08-04 ENCOUNTER — Ambulatory Visit (INDEPENDENT_AMBULATORY_CARE_PROVIDER_SITE_OTHER): Payer: Medicare Other | Admitting: Cardiology

## 2017-08-04 ENCOUNTER — Encounter: Payer: Self-pay | Admitting: *Deleted

## 2017-08-04 VITALS — BP 119/84 | HR 67 | Ht 63.0 in | Wt 143.2 lb

## 2017-08-04 DIAGNOSIS — Z87898 Personal history of other specified conditions: Secondary | ICD-10-CM | POA: Diagnosis not present

## 2017-08-04 DIAGNOSIS — I10 Essential (primary) hypertension: Secondary | ICD-10-CM

## 2017-08-04 DIAGNOSIS — I480 Paroxysmal atrial fibrillation: Secondary | ICD-10-CM

## 2017-08-04 DIAGNOSIS — I350 Nonrheumatic aortic (valve) stenosis: Secondary | ICD-10-CM | POA: Diagnosis not present

## 2017-08-04 MED ORDER — APIXABAN 5 MG PO TABS: 5.0000 mg | ORAL_TABLET | Freq: Two times a day (BID) | ORAL | 3 refills | Status: DC

## 2017-08-04 MED ORDER — METOPROLOL SUCCINATE ER 25 MG PO TB24: 12.5000 mg | ORAL_TABLET | Freq: Two times a day (BID) | ORAL | 3 refills | Status: DC

## 2017-08-04 MED ORDER — AMIODARONE HCL 100 MG PO TABS: 50.0000 mg | ORAL_TABLET | Freq: Every day | ORAL | 1 refills | Status: DC

## 2017-08-04 NOTE — Progress Notes (Signed)
Carelink Summary Report / Loop Recorder 

## 2017-08-04 NOTE — Progress Notes (Signed)
Cardiology Office Note  Date: 08/04/2017   ID: Christine Pugh, DOB 1922-04-05, MRN [id number]  PCP: Caryl Bis, MD  Primary Cardiologist: Rozann Lesches, MD   Chief Complaint  Patient presents with  . Aortic Stenosis    History of Present Illness: Christine Pugh is a 84 y.o. female last seen in July. She is here for a routine follow-up visit. States that she has had no episodes of syncope or unusual lightheadedness. She swims 3 days a week at the [facility], only does a few laps at this point. She also tells me that she eventually plans to move in with her son in his home.   She continues to follow in the device clinic with Dr. Rayann Heman, ILR in place. Most recent interrogation reviewed with no new episodes, specifically no pauses, tachycardia, or bradycardia.  I reviewed her medications. She has had no changes from a cardiac perspective. She is due for follow-up lab work with Dr. Quillian Quince later this month. No reported bleeding episodes.  Past Medical History:  Diagnosis Date  . Aortic insufficiency    Mild  . Aortic stenosis    Moderate to severe  . 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  s/p Left brachial, radial, ulnar, axillary embolectomy 08/2016.  Marland Kitchen Atypical atrial flutter ([place])   . Embolic stroke involving right cerebellar artery ([place])   . Essential hypertension   . Hypothyroidism   . Ischemia of extremity   . Memory loss 11/26/2016  . Moderate to severe aortic stenosis   . PAF (paroxysmal atrial fibrillation) ([place])   . Paroxysmal atrial tachycardia ([place])   . Stroke ([place]) 08/2016  . Syncope    a. s/p ILR 01/2016.    Past Surgical History:  Procedure Laterality Date  . APPENDECTOMY    . BREAST LUMPECTOMY    . dental implant    . EP IMPLANTABLE DEVICE N/A 01/10/2016   Procedure: Loop Recorder Insertion;  Surgeon: [name], MD;  Location: [place] [facility];  Service: Cardiovascular;  Laterality: N/A;  . IR GENERIC HISTORICAL  08/23/2016   IR  US GUIDE VASC ACCESS RIGHT 08/23/2016 Corrie Mckusick, DO [facility]  . IR GENERIC HISTORICAL  08/23/2016   IR PERCUTANEOUS ART THROMBECTOMY/INFUSION INTRACRANIAL INC DIAG ANGIO 08/23/2016 Corrie Mckusick, DO [facility]  . IR GENERIC HISTORICAL  08/23/2016   IR ANGIO INTRA EXTRACRAN SEL COM CAROTID INNOMINATE UNI L MOD SED 08/23/2016 Corrie Mckusick, DO [facility]  . RADIOLOGY WITH ANESTHESIA Bilateral 08/23/2016   Procedure: RADIOLOGY WITH ANESTHESIA;  Surgeon: Medication Radiologist, MD;  Location: [place];  Service: Radiology;  Laterality: Bilateral;  . THROMBECTOMY BRACHIAL ARTERY Left 08/23/2016   Procedure: Left Radial, Ulnar, Brachial and Axillary Embolectomy;  Surgeon: Elam Dutch, MD;  Location: [place];  Service: Vascular;  Laterality: Left;  . THYROIDECTOMY    . TONSILLECTOMY    . TOTAL ABDOMINAL HYSTERECTOMY    . VITRECTOMY AND CATARACT      Current Outpatient Medications  Medication Sig Dispense Refill  . hydrocortisone (ANUSOL-HC) 25 MG suppository Place 1 suppository (25 mg total) rectally 2 (two) times daily. 6 suppository 0  . levothyroxine (SYNTHROID, LEVOTHROID) 112 MCG tablet Take 1 tablet (112 mcg total) by mouth daily before breakfast. 30 tablet 0  . Lutein-Zeaxanthin 25-5 MG CAPS Take 1 capsule by mouth 2 (two) times daily.    . Multiple Vitamin (MULTIVITAMIN) tablet Take 1 tablet by mouth daily.      Marland Kitchen  triamcinolone cream (KENALOG) 0.1 % Apply 1 application topically daily as needed (irritated skin).     Marland Kitchen triamterene-hydrochlorothiazide (DYAZIDE) 37.5-25 MG capsule Take 1 each (1 capsule total) by mouth daily. 30 capsule 0  . amiodarone (PACERONE) 100 MG tablet Take 0.5 tablets (50 mg total) by mouth daily. 90 tablet 1  . apixaban (ELIQUIS) 5 MG TABS tablet Take 1 tablet (5 mg total) by mouth 2 (two) times daily. 60 tablet 3  . metoprolol succinate (TOPROL XL) 25 MG 24 hr tablet Take 0.5 tablets (12.5 mg total) by mouth 2 (two) times daily. 60 tablet 3   No current  facility-administered medications for this visit.    Allergies:  Patient has no known allergies.   Social History: The patient  reports that  has never smoked. she has never used smokeless tobacco. She reports that she does not drink alcohol or use drugs.   ROS:  Please see the history of present illness. Otherwise, complete review of systems is positive for arthritic stiffness.  All other systems are reviewed and negative.   Physical Exam: VS:  BP 119/84   Pulse 67   Ht 5\' 3"  (1.6 m)   Wt 143 lb 3.2 oz (65 kg)   BMI 25.37 kg/m , BMI Body mass index is 25.37 kg/m.  Wt Readings from Last 3 Encounters:  08/04/17 143 lb 3.2 oz (65 kg)  02/12/17 143 lb (64.9 kg)  11/26/16 143 lb (64.9 kg)    General: Elderly woman, appears comfortable at rest. Using a cane. HEENT: Conjunctiva and lids normal, oropharynx clear. Neck: Supple, no elevated JVP or carotid bruits, no thyromegaly. Lungs: Clear to auscultation, nonlabored breathing at rest. Cardiac: Regular rate and rhythm, no S3, 3/6 systolic murmur. Abdomen: Soft, nontender, bowel sounds present. Extremities: Venous stasis, distal pulses 2+. Skin: Warm and dry. Musculoskeletal: Kyphosis. Neuropsychiatric: Alert and oriented x3, affect grossly appropriate.  ECG: I personally reviewed the tracing from 11/24/2016 which showed sinus rhythm with prolonged PR interval and nonspecific T-wave changes.  Recent Labwork: 08/29/2016: ALT 22; AST 39 09/01/2016: Magnesium 1.7 09/03/2016: Hemoglobin 11.6; Platelets 416 09/04/2016: BUN 23; Creatinine, Ser 0.89; Potassium 3.6; Sodium 137 11/26/2016: TSH 1.470     Component Value Date/Time   CHOL 164 08/25/2016 0331   TRIG 69 08/25/2016 0331   HDL 56 08/25/2016 0331   CHOLHDL 2.9 08/25/2016 0331   VLDL 14 08/25/2016 0331   LDLCALC 94 08/25/2016 0331    Other Studies Reviewed Today:  Echocardiogram 08/24/2016: Study Conclusions  - Left ventricle: The cavity size was normal. There was  mild concentric hypertrophy. Systolic function was vigorous. The estimated ejection fraction was in the range of 65% to 70%. Wall motion was normal; there were no regional wall motion abnormalities. Features are consistent with a pseudonormal left ventricular filling pattern, with concomitant abnormal relaxation and increased filling pressure (grade 2 diastolic dysfunction). Doppler parameters are consistent with elevated ventricular end-diastolic filling pressure. - Aortic valve: There was moderate stenosis. There was mild regurgitation. Mean gradient (S): 44 mm Hg. Peak gradient (S): 74 mm Hg. Valve area (VTI): 0.64 cm^2. Valve area (Vmax): 0.59 cm^2. Valve area (Vmean): 0.65 cm^2. - Aortic root: The aortic root was normal in size. - Mitral valve: There was mild regurgitation. - Left atrium: The atrium was moderately dilated. - Right ventricle: Systolic function was normal. - Tricuspid valve: There was mild regurgitation. - Pulmonary arteries: Systolic pressure was mildly increased. PA peak pressure: 33 mm Hg (S). - Inferior vena cava:  The vessel was normal in size. - Pericardium, extracardiac: There was no pericardial effusion.  Impressions:  - Severe aortic stenosis.  Assessment and Plan:  1. Severe low gradient aortic stenosis with mild aortic regurgitation. She remains symptomatically stable and we will continue with conservative management at patient preference. She has declined TAVR.  2. Paroxysmal atrial fibrillation/flutter. She remains stable without palpitations on low-dose amiodarone and Toprol-XL. Tolerating Eliquis without bleeding problems. She is due for lab work with Dr. Quillian Quince later this month.  3. History of syncope without recurrence. ILR in place, most recent interrogation showing no events.  4. Essential hypertension, blood pressure is well controlled today.  Current medicines were reviewed with the patient today.  Disposition:  Follow-up in 4 months.  Signed, Satira Sark, MD, [facility] 08/04/2017 11:17 AM    [facility] [place], [place], [place] [address] Phone: [phone]; Fax: [phone]

## 2017-08-04 NOTE — Patient Instructions (Signed)
Medication Instructions:  Your physician recommends that you continue on your current medications as directed. Please refer to the Current Medication list given to you today.  Labwork: NONE  Testing/Procedures: NONE  Follow-Up: Your physician wants you to follow-up in: 4 MONTHS WITH DR. MCDOWELL. You will receive a reminder letter in the mail two months in advance. If you don't receive a letter, please call our office to schedule the follow-up appointment.  Any Other Special Instructions Will Be Listed Below (If Applicable).  If you need a refill on your cardiac medications before your next appointment, please call your pharmacy. 

## 2017-08-12 ENCOUNTER — Other Ambulatory Visit: Payer: Self-pay | Admitting: Cardiology

## 2017-08-12 LAB — CUP PACEART REMOTE DEVICE CHECK
Implantable Pulse Generator Implant Date: 20170609
MDC IDC SESS DTM: 20181231193718

## 2017-08-12 MED ORDER — APIXABAN 5 MG PO TABS: 5.0000 mg | ORAL_TABLET | Freq: Two times a day (BID) | ORAL | 3 refills | Status: DC

## 2017-08-12 NOTE — Telephone Encounter (Signed)
Refill sent to [facility].  

## 2017-08-12 NOTE — Telephone Encounter (Signed)
°  1. Which medications need to be refilled? (please list name of each medication and dose if known) apixaban (ELIQUIS) 5 MG TABS    2. Which pharmacy/location (including street and city if local pharmacy) is medication to be sent to? [facility]  3. Do they need a 30 day or 90 day supply?

## 2017-08-19 DIAGNOSIS — I482 Chronic atrial fibrillation: Secondary | ICD-10-CM | POA: Diagnosis not present

## 2017-08-19 DIAGNOSIS — E876 Hypokalemia: Secondary | ICD-10-CM | POA: Diagnosis not present

## 2017-08-19 DIAGNOSIS — E039 Hypothyroidism, unspecified: Secondary | ICD-10-CM | POA: Diagnosis not present

## 2017-08-19 DIAGNOSIS — I10 Essential (primary) hypertension: Secondary | ICD-10-CM | POA: Diagnosis not present

## 2017-08-19 DIAGNOSIS — Z7901 Long term (current) use of anticoagulants: Secondary | ICD-10-CM | POA: Diagnosis not present

## 2017-08-19 DIAGNOSIS — R5383 Other fatigue: Secondary | ICD-10-CM | POA: Diagnosis not present

## 2017-08-19 DIAGNOSIS — E782 Mixed hyperlipidemia: Secondary | ICD-10-CM | POA: Diagnosis not present

## 2017-08-19 DIAGNOSIS — Z9189 Other specified personal risk factors, not elsewhere classified: Secondary | ICD-10-CM | POA: Diagnosis not present

## 2017-08-23 DIAGNOSIS — H35361 Drusen (degenerative) of macula, right eye: Secondary | ICD-10-CM | POA: Diagnosis not present

## 2017-08-23 DIAGNOSIS — H35362 Drusen (degenerative) of macula, left eye: Secondary | ICD-10-CM | POA: Diagnosis not present

## 2017-08-23 DIAGNOSIS — H353222 Exudative age-related macular degeneration, left eye, with inactive choroidal neovascularization: Secondary | ICD-10-CM | POA: Diagnosis not present

## 2017-08-23 DIAGNOSIS — H353132 Nonexudative age-related macular degeneration, bilateral, intermediate dry stage: Secondary | ICD-10-CM | POA: Diagnosis not present

## 2017-08-24 DIAGNOSIS — Z6825 Body mass index (BMI) 25.0-25.9, adult: Secondary | ICD-10-CM | POA: Diagnosis not present

## 2017-08-24 DIAGNOSIS — I10 Essential (primary) hypertension: Secondary | ICD-10-CM | POA: Diagnosis not present

## 2017-08-24 DIAGNOSIS — I482 Chronic atrial fibrillation: Secondary | ICD-10-CM | POA: Diagnosis not present

## 2017-08-24 DIAGNOSIS — E782 Mixed hyperlipidemia: Secondary | ICD-10-CM | POA: Diagnosis not present

## 2017-08-24 DIAGNOSIS — E039 Hypothyroidism, unspecified: Secondary | ICD-10-CM | POA: Diagnosis not present

## 2017-08-24 DIAGNOSIS — M545 Low back pain: Secondary | ICD-10-CM | POA: Diagnosis not present

## 2017-08-24 DIAGNOSIS — I8391 Asymptomatic varicose veins of right lower extremity: Secondary | ICD-10-CM | POA: Diagnosis not present

## 2017-08-24 DIAGNOSIS — I350 Nonrheumatic aortic (valve) stenosis: Secondary | ICD-10-CM | POA: Diagnosis not present

## 2017-09-01 ENCOUNTER — Ambulatory Visit (INDEPENDENT_AMBULATORY_CARE_PROVIDER_SITE_OTHER): Payer: Medicare Other | Admitting: *Deleted

## 2017-09-01 DIAGNOSIS — R55 Syncope and collapse: Secondary | ICD-10-CM

## 2017-09-02 NOTE — Progress Notes (Signed)
Carelink Summary Report / Loop Recorder 

## 2017-09-14 LAB — CUP PACEART REMOTE DEVICE CHECK
MDC IDC PG IMPLANT DT: 20170609
MDC IDC SESS DTM: 20190130203943

## 2017-10-04 ENCOUNTER — Ambulatory Visit (INDEPENDENT_AMBULATORY_CARE_PROVIDER_SITE_OTHER): Payer: Medicare Other | Admitting: *Deleted

## 2017-10-04 DIAGNOSIS — R55 Syncope and collapse: Secondary | ICD-10-CM

## 2017-10-05 NOTE — Progress Notes (Signed)
Carelink Summary Report / Loop Recorder 

## 2017-10-06 ENCOUNTER — Telehealth: Payer: Self-pay | Admitting: Cardiology

## 2017-10-06 NOTE — Telephone Encounter (Signed)
Spoke w/ pt and requested that she send a manual transmission b/c her home monitor has not updated in at least 14 days.   

## 2017-10-07 DIAGNOSIS — L570 Actinic keratosis: Secondary | ICD-10-CM | POA: Diagnosis not present

## 2017-10-11 ENCOUNTER — Encounter: Payer: Self-pay | Admitting: Internal Medicine

## 2017-11-05 ENCOUNTER — Encounter: Payer: Self-pay | Admitting: *Deleted

## 2017-11-05 ENCOUNTER — Ambulatory Visit (INDEPENDENT_AMBULATORY_CARE_PROVIDER_SITE_OTHER): Payer: Medicare Other | Admitting: Internal Medicine

## 2017-11-05 VITALS — BP 136/80 | HR 64 | Ht 64.0 in | Wt 146.8 lb

## 2017-11-05 DIAGNOSIS — I480 Paroxysmal atrial fibrillation: Secondary | ICD-10-CM | POA: Diagnosis not present

## 2017-11-05 DIAGNOSIS — I10 Essential (primary) hypertension: Secondary | ICD-10-CM | POA: Diagnosis not present

## 2017-11-05 DIAGNOSIS — R55 Syncope and collapse: Secondary | ICD-10-CM

## 2017-11-05 NOTE — Progress Notes (Signed)
PCP: Caryl Bis, MD Primary Cardiologist: Domenic Polite Primary EP: Dr Audelia Acton is a 84 y.o. female who presents today for routine electrophysiology followup.  Since last being seen in our clinic, the patient reports doing reasonably well.  She continues to swim and remains active.  Today, she denies symptoms of palpitations, chest pain, shortness of breath,  lower extremity edema, dizziness, presyncope, or syncope.  The patient is otherwise without complaint today.   Past Medical History:  Diagnosis Date  . Aortic insufficiency    Mild  . Aortic stenosis    Moderate to severe  . 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  s/p Left brachial, radial, ulnar, axillary embolectomy 08/2016.  Marland Kitchen Atypical atrial flutter ([place])   . Embolic stroke involving right cerebellar artery ([place])   . Essential hypertension   . Hypothyroidism   . Ischemia of extremity   . Memory loss 11/26/2016  . Moderate to severe aortic stenosis   . PAF (paroxysmal atrial fibrillation) ([place])   . Paroxysmal atrial tachycardia ([place])   . Stroke ([place]) 08/2016  . Syncope    a. s/p ILR 01/2016.   Past Surgical History:  Procedure Laterality Date  . APPENDECTOMY    . BREAST LUMPECTOMY    . dental implant    . EP IMPLANTABLE DEVICE N/A 01/10/2016   Procedure: Loop Recorder Insertion;  Surgeon: [name], MD;  Location: [facility];  Service: Cardiovascular;  Laterality: N/A;  . IR GENERIC HISTORICAL  08/23/2016   IR US GUIDE VASC ACCESS RIGHT 08/23/2016 Corrie Mckusick, DO [facility]  . IR GENERIC HISTORICAL  08/23/2016   IR PERCUTANEOUS ART THROMBECTOMY/INFUSION INTRACRANIAL INC DIAG ANGIO 08/23/2016 Corrie Mckusick, DO [facility]  . IR GENERIC HISTORICAL  08/23/2016   IR ANGIO INTRA EXTRACRAN SEL COM CAROTID INNOMINATE UNI L MOD SED 08/23/2016 Corrie Mckusick, DO [facility]  . RADIOLOGY WITH ANESTHESIA Bilateral 08/23/2016   Procedure: RADIOLOGY WITH ANESTHESIA;  Surgeon: Medication  Radiologist, MD;  Location: [facility];  Service: Radiology;  Laterality: Bilateral;  . THROMBECTOMY BRACHIAL ARTERY Left 08/23/2016   Procedure: Left Radial, Ulnar, Brachial and Axillary Embolectomy;  Surgeon: Elam Dutch, MD;  Location: [facility];  Service: Vascular;  Laterality: Left;  . THYROIDECTOMY    . TONSILLECTOMY    . TOTAL ABDOMINAL HYSTERECTOMY    . VITRECTOMY AND CATARACT      ROS- all systems are reviewed and negatives except as per HPI above  Current Outpatient Medications  Medication Sig Dispense Refill  . amiodarone (PACERONE) 100 MG tablet Take 0.5 tablets (50 mg total) by mouth daily. 90 tablet 1  . apixaban (ELIQUIS) 5 MG TABS tablet Take 1 tablet (5 mg total) by mouth 2 (two) times daily. 60 tablet 3  . hydrocortisone (ANUSOL-HC) 25 MG suppository Place 1 suppository (25 mg total) rectally 2 (two) times daily. 6 suppository 0  . levothyroxine (SYNTHROID, LEVOTHROID) 112 MCG tablet Take 1 tablet (112 mcg total) by mouth daily before breakfast. 30 tablet 0  . Lutein-Zeaxanthin 25-5 MG CAPS Take 1 capsule by mouth 2 (two) times daily.    . metoprolol succinate (TOPROL XL) 25 MG 24 hr tablet Take 0.5 tablets (12.5 mg total) by mouth 2 (two) times daily. 60 tablet 3  . Multiple Vitamin (MULTIVITAMIN) tablet Take 1 tablet by mouth daily.      Marland Kitchen triamcinolone cream (KENALOG) 0.1 % Apply 1 application topically daily as needed (irritated skin).     Marland Kitchen  triamterene-hydrochlorothiazide (DYAZIDE) 37.5-25 MG capsule Take 1 each (1 capsule total) by mouth daily. 30 capsule 0   No current facility-administered medications for this visit.     Physical Exam: Vitals:   11/05/17 0831  BP: 136/80  Pulse: 64  Weight: 146 lb 12.8 oz (66.6 kg)  Height: 5\' 4"  (1.626 m)    GEN- The patient is well appearing, alert and oriented x 3 today.   Head- normocephalic, atraumatic Eyes-  Sclera clear, conjunctiva pink Ears- hearing intact Oropharynx- clear Lungs- Clear to ausculation bilaterally,  normal work of breathing Heart- Regular rate and rhythm, 2/6 SEM LUSB which is late peaking GI- soft, NT, ND, + BS Extremities- no clubbing, cyanosis, or edema  EKG tracing ordered today is personally reviewed and shows sinus rhythm 58 bpm, PR 196 msec, LAHB  Assessment and Plan:  1. Syncope No further episodes Following with ILR  2. Afib/ atypical flutter On low dose amiodarone and eliquis afib burden by device interrogation is 0.1% PCP following LFTs, TFTs  3. HTN Stable No change required today  [facility] Return to see Dr Domenic Polite as scheduled I will see as needed  [name] MD, [facility] 11/05/2017 9:05 AM

## 2017-11-05 NOTE — Patient Instructions (Signed)
Medication Instructions:  Continue all current medications.  Labwork: none  Testing/Procedures: none  Follow-Up: As needed   Any Other Special Instructions Will Be Listed Below (If Applicable). Continue monthly automatic reports.   If you need a refill on your cardiac medications before your next appointment, please call your pharmacy.

## 2017-11-08 ENCOUNTER — Other Ambulatory Visit: Payer: Self-pay | Admitting: Cardiology

## 2017-11-08 ENCOUNTER — Ambulatory Visit (INDEPENDENT_AMBULATORY_CARE_PROVIDER_SITE_OTHER): Payer: Medicare Other | Admitting: *Deleted

## 2017-11-08 DIAGNOSIS — R55 Syncope and collapse: Secondary | ICD-10-CM

## 2017-11-08 LAB — CUP PACEART REMOTE DEVICE CHECK
Implantable Pulse Generator Implant Date: 20170609
MDC IDC SESS DTM: 20190304231021

## 2017-11-09 NOTE — Progress Notes (Signed)
Carelink Summary Report / Loop Recorder 

## 2017-11-18 LAB — CUP PACEART INCLINIC DEVICE CHECK
MDC IDC PG IMPLANT DT: 20170609
MDC IDC SESS DTM: 20190418130943

## 2017-11-23 ENCOUNTER — Other Ambulatory Visit: Payer: Self-pay | Admitting: Internal Medicine

## 2017-12-03 NOTE — Progress Notes (Signed)
Cardiology Office Note  Date: 12/06/2017   ID: Christine Pugh, DOB [birth year], MRN [id number]  PCP: Caryl Bis, MD  Primary Cardiologist: Rozann Lesches, MD   Chief Complaint  Patient presents with  . Aortic Stenosis    History of Present Illness: Christine Pugh is a 84 y.o. female last seen in January.  She is here for a follow-up visit.  She does not report any palpitations or chest pain, has stable dyspnea on exertion.  She exercises in the pool 3 days a week at the [facility].  Still able to do some backstroke.  She is unsteady on her feet, uses a walker and also a cane.  She denies any recent falls.  She continues to follow in the device clinic with Dr. Rayann Heman, ILR in place.  She has had no significant events to explain prior syncope.  Otherwise with low atrial fibrillation/flutter burden.  I reviewed her medications.  She remains on amiodarone, Eliquis, and Toprol-XL.  We discussed follow-up lab work.  She does not report any bleeding episodes, actually states that she has less bruising than when she was on aspirin.  Past Medical History:  Diagnosis Date  . Aortic insufficiency    Mild  . Aortic stenosis    Moderate to severe  . 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  s/p Left brachial, radial, ulnar, axillary embolectomy 08/2016.  Marland Kitchen Atypical atrial flutter ([place])   . Embolic stroke involving right cerebellar artery ([place])   . Essential hypertension   . Hypothyroidism   . Ischemia of extremity   . Memory loss 11/26/2016  . Moderate to severe aortic stenosis   . PAF (paroxysmal atrial fibrillation) ([place])   . Paroxysmal atrial tachycardia ([place])   . Stroke ([place]) 08/2016  . Syncope    a. s/p ILR 01/2016.    Past Surgical History:  Procedure Laterality Date  . APPENDECTOMY    . BREAST LUMPECTOMY    . dental implant    . EP IMPLANTABLE DEVICE N/A 01/10/2016   Procedure: Loop Recorder Insertion;  Surgeon: [name], MD;  Location: [facility];  Service: Cardiovascular;  Laterality: N/A;  . IR GENERIC HISTORICAL  08/23/2016   IR US GUIDE VASC ACCESS RIGHT 08/23/2016 Corrie Mckusick, DO [facility]  . IR GENERIC HISTORICAL  08/23/2016   IR PERCUTANEOUS ART THROMBECTOMY/INFUSION INTRACRANIAL INC DIAG ANGIO 08/23/2016 Corrie Mckusick, DO [facility]  . IR GENERIC HISTORICAL  08/23/2016   IR ANGIO INTRA EXTRACRAN SEL COM CAROTID INNOMINATE UNI L MOD SED 08/23/2016 Corrie Mckusick, DO [facility]  . RADIOLOGY WITH ANESTHESIA Bilateral 08/23/2016   Procedure: RADIOLOGY WITH ANESTHESIA;  Surgeon: Medication Radiologist, MD;  Location: [facility];  Service: Radiology;  Laterality: Bilateral;  . THROMBECTOMY BRACHIAL ARTERY Left 08/23/2016   Procedure: Left Radial, Ulnar, Brachial and Axillary Embolectomy;  Surgeon: Elam Dutch, MD;  Location: [facility];  Service: Vascular;  Laterality: Left;  . THYROIDECTOMY    . TONSILLECTOMY    . TOTAL ABDOMINAL HYSTERECTOMY    . VITRECTOMY AND CATARACT      Current Outpatient Medications  Medication Sig Dispense Refill  . amiodarone (PACERONE) 100 MG tablet Take 0.5 tablets (50 mg total) by mouth daily. 90 tablet 1  . apixaban (ELIQUIS) 5 MG TABS tablet Take 1 tablet (5 mg total) by mouth 2 (two) times daily. 180 tablet 1  . hydrocortisone (ANUSOL-HC) 25 MG suppository Place 1 suppository (25 mg total) rectally 2 (  two) times daily. 6 suppository 0  . levothyroxine (SYNTHROID, LEVOTHROID) 112 MCG tablet Take 1 tablet (112 mcg total) by mouth daily before breakfast. 30 tablet 0  . Lutein-Zeaxanthin 25-5 MG CAPS Take 1 capsule by mouth 2 (two) times daily.    . metoprolol succinate (TOPROL XL) 25 MG 24 hr tablet Take 0.5 tablets (12.5 mg total) by mouth 2 (two) times daily. 60 tablet 3  . Multiple Vitamin (MULTIVITAMIN) tablet Take 1 tablet by mouth daily.      Marland Kitchen triamcinolone cream (KENALOG) 0.1 % Apply 1 application topically daily as needed (irritated skin).     Marland Kitchen triamterene-hydrochlorothiazide (DYAZIDE)  37.5-25 MG capsule Take 1 each (1 capsule total) by mouth daily. 30 capsule 0   No current facility-administered medications for this visit.    Allergies:  Patient has no known allergies.   Social History: The patient  reports that she has never smoked. She has never used smokeless tobacco. She reports that she does not drink alcohol or use drugs.   ROS:  Please see the history of present illness. Otherwise, complete review of systems is positive for hearing loss.  All other systems are reviewed and negative.   Physical Exam: VS:  BP (!) 150/78   Pulse 62   Ht 5\' 3"  (1.6 m)   Wt 148 lb (67.1 kg)   BMI 26.22 kg/m , BMI Body mass index is 26.22 kg/m.  Wt Readings from Last 3 Encounters:  12/06/17 148 lb (67.1 kg)  11/05/17 146 lb 12.8 oz (66.6 kg)  08/04/17 143 lb 3.2 oz (65 kg)    General: Elderly woman, appears comfortable at rest.  Wearing a protective headband. HEENT: Conjunctiva and lids normal, oropharynx clear . Neck: Supple, no elevated JVP or carotid bruits, no thyromegaly. Lungs: Clear to auscultation, nonlabored breathing at rest. Cardiac: Regular rate and rhythm, no S3, 3/6 systolic murmur. Abdomen: Soft, nontender, bowel sounds present. Extremities: Chronic venous stasis, distal pulses 2+. Skin: Warm and dry. Musculoskeletal: Kyphosis. Neuropsychiatric: Alert and oriented x3, affect grossly appropriate.  ECG: I personally reviewed the tracing from 11/05/2017 which showed sinus bradycardia with decreased R wave progression and left anterior fascicular block.  Recent Labwork:    Component Value Date/Time   CHOL 164 08/25/2016 0331   TRIG 69 08/25/2016 0331   HDL 56 08/25/2016 0331   CHOLHDL 2.9 08/25/2016 0331   VLDL 14 08/25/2016 0331   LDLCALC 94 08/25/2016 0331  February 2018: Hemoglobin 13.5, platelets 229  Other Studies Reviewed Today:  Echocardiogram 08/24/2016: Study Conclusions  - Left ventricle: The cavity size was normal. There was  mild concentric hypertrophy. Systolic function was vigorous. The estimated ejection fraction was in the range of 65% to 70%. Wall motion was normal; there were no regional wall motion abnormalities. Features are consistent with a pseudonormal left ventricular filling pattern, with concomitant abnormal relaxation and increased filling pressure (grade 2 diastolic dysfunction). Doppler parameters are consistent with elevated ventricular end-diastolic filling pressure. - Aortic valve: There was moderate stenosis. There was mild regurgitation. Mean gradient (S): 44 mm Hg. Peak gradient (S): 74 mm Hg. Valve area (VTI): 0.64 cm^2. Valve area (Vmax): 0.59 cm^2. Valve area (Vmean): 0.65 cm^2. - Aortic root: The aortic root was normal in size. - Mitral valve: There was mild regurgitation. - Left atrium: The atrium was moderately dilated. - Right ventricle: Systolic function was normal. - Tricuspid valve: There was mild regurgitation. - Pulmonary arteries: Systolic pressure was mildly increased. PA peak pressure: 33 mm  Hg (S). - Inferior vena cava: The vessel was normal in size. - Pericardium, extracardiac: There was no pericardial effusion.  Impressions:  - Severe aortic stenosis.  Assessment and Plan:  1.  Severe low gradient aortic stenosis with mild aortic regurgitation.  She has undergone consultation for TAVR and declined.  Plan is to continue conservative management.  I do not plan repeat imaging studies at this point.  2.  Paroxysmal atrial fibrillation/flutter.  She continues to do very well with low arrhythmia burden.  Continue Toprol-XL, amiodarone, and Eliquis.  Follow-up CBC, CMET, and TSH.  3.  History of syncope, no recurrences.  No obvious arrhythmias to explain event with ILR in place.  4.  Essential hypertension, no changes made to current regimen, keep follow-up with Dr. Quillian Quince.  Current medicines were reviewed with the patient today.   Orders  Placed This Encounter  Procedures  . TSH  . Comprehensive Metabolic Panel (CMET)  . CBC    Disposition: Follow-up in 6 months.  Signed, Satira Sark, MD, [facility] 12/06/2017 10:06 AM    [facility] [place], [place], [place] [address] Phone: [phone]; Fax: [phone]

## 2017-12-06 ENCOUNTER — Encounter: Payer: Self-pay | Admitting: Cardiology

## 2017-12-06 ENCOUNTER — Ambulatory Visit (INDEPENDENT_AMBULATORY_CARE_PROVIDER_SITE_OTHER): Payer: Medicare Other | Admitting: Cardiology

## 2017-12-06 VITALS — BP 150/78 | HR 62 | Ht 63.0 in | Wt 148.0 lb

## 2017-12-06 DIAGNOSIS — I10 Essential (primary) hypertension: Secondary | ICD-10-CM

## 2017-12-06 DIAGNOSIS — Z87898 Personal history of other specified conditions: Secondary | ICD-10-CM | POA: Diagnosis not present

## 2017-12-06 DIAGNOSIS — I350 Nonrheumatic aortic (valve) stenosis: Secondary | ICD-10-CM

## 2017-12-06 DIAGNOSIS — I480 Paroxysmal atrial fibrillation: Secondary | ICD-10-CM | POA: Diagnosis not present

## 2017-12-06 MED ORDER — APIXABAN 5 MG PO TABS: 5.0000 mg | ORAL_TABLET | Freq: Two times a day (BID) | ORAL | 1 refills | Status: DC

## 2017-12-06 NOTE — Patient Instructions (Signed)
Medication Instructions:  Your physician recommends that you continue on your current medications as directed. Please refer to the Current Medication list given to you today.  Labwork: TSH/CMET/CBC Orders given today  Testing/Procedures: NONE  Follow-Up: Your physician wants you to follow-up in: 6 MONTHS WITH DR. Domenic Polite You will receive a reminder letter in the mail two months in advance. If you don't receive a letter, please call our office to schedule the follow-up appointment.  Any Other Special Instructions Will Be Listed Below (If Applicable).  If you need a refill on your cardiac medications before your next appointment, please call your pharmacy.

## 2017-12-07 DIAGNOSIS — I350 Nonrheumatic aortic (valve) stenosis: Secondary | ICD-10-CM | POA: Diagnosis not present

## 2017-12-07 DIAGNOSIS — I480 Paroxysmal atrial fibrillation: Secondary | ICD-10-CM | POA: Diagnosis not present

## 2017-12-09 ENCOUNTER — Ambulatory Visit (INDEPENDENT_AMBULATORY_CARE_PROVIDER_SITE_OTHER): Payer: Medicare Other | Admitting: *Deleted

## 2017-12-09 DIAGNOSIS — R55 Syncope and collapse: Secondary | ICD-10-CM

## 2017-12-09 LAB — CUP PACEART REMOTE DEVICE CHECK
Date Time Interrogation Session: 20190406234120
Implantable Pulse Generator Implant Date: 20170609

## 2017-12-13 NOTE — Progress Notes (Signed)
Carelink Summary Report / Loop Recorder 

## 2018-01-03 LAB — CUP PACEART REMOTE DEVICE CHECK
Implantable Pulse Generator Implant Date: 20170609
MDC IDC SESS DTM: 20190510000928

## 2018-01-10 ENCOUNTER — Encounter: Payer: Self-pay | Admitting: Internal Medicine

## 2018-01-10 IMAGING — CT CT HEAD W/O CM
3 of 4 series · 16 of 47 positions shown, 19 images · non-contrast
Comparison: Head CT/ CTA/ cerebral perfusion earlier today

CLINICAL DATA: Stroke. Right MCA occlusion status post
revascularization.

EXAM:
CT HEAD WITHOUT CONTRAST
TECHNIQUE: Contiguous axial images were obtained from the base of the skull
through the vertex without intravenous contrast.

[Series 201: head w/o, idose (1) · axial · non-contrast · 0.49mm/px · z∈[+119,+244]mm · 10 of 31 slices shown, 13 images]
[im 3/31  brain]
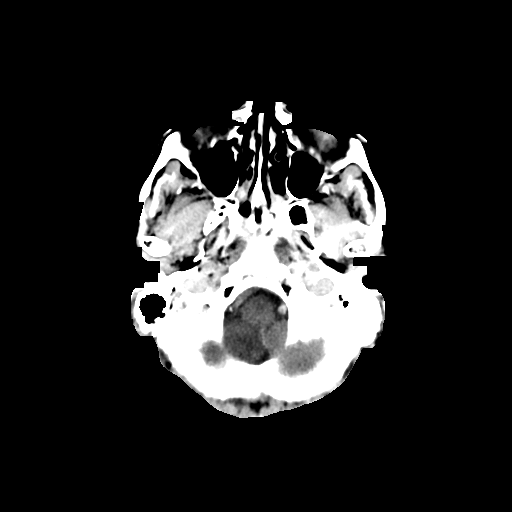
[im 3/31  bone]
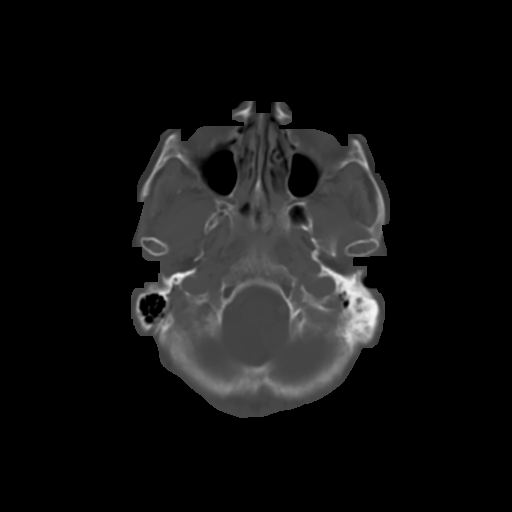
[im 5/31  brain]
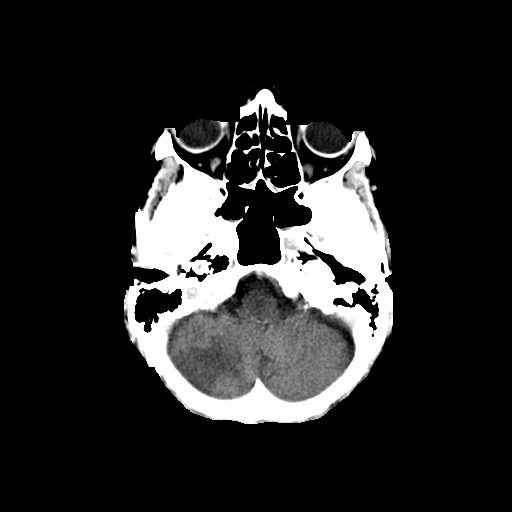
[im 9/31  brain]
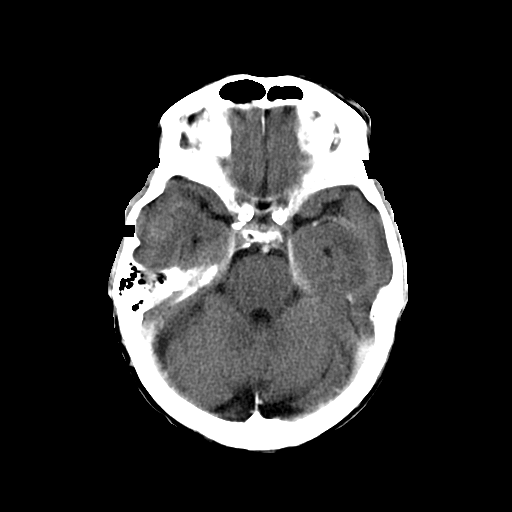
[im 11/31  brain]
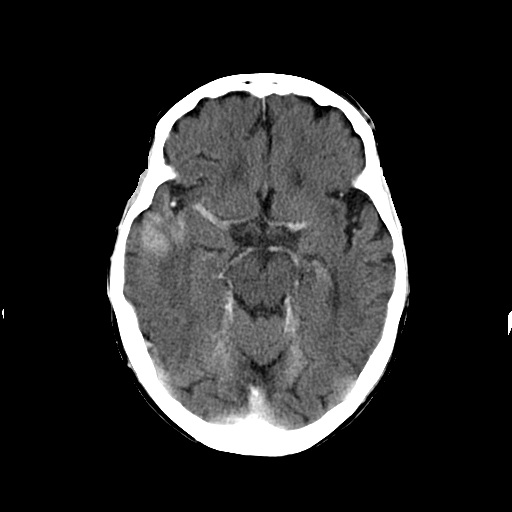
[im 13/31  brain]
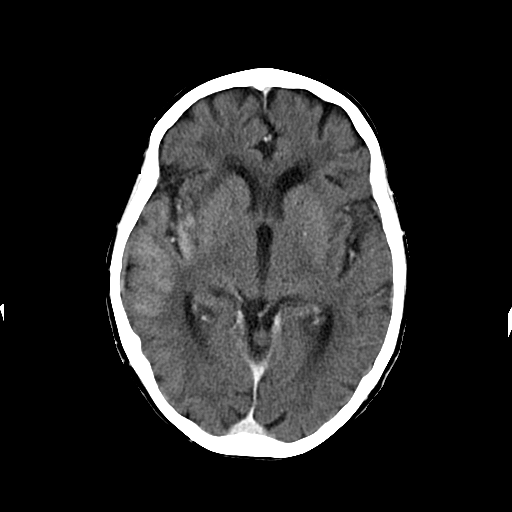
[im 13/31  bone]
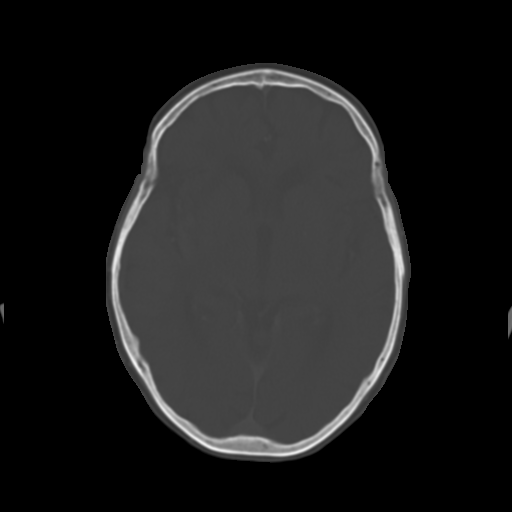
[im 18/31  brain]
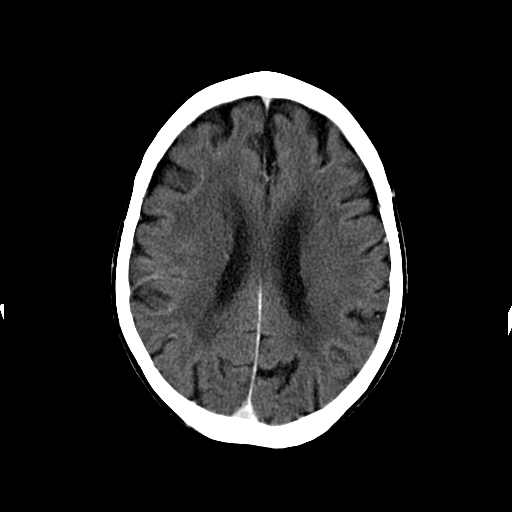
[im 20/31  brain]
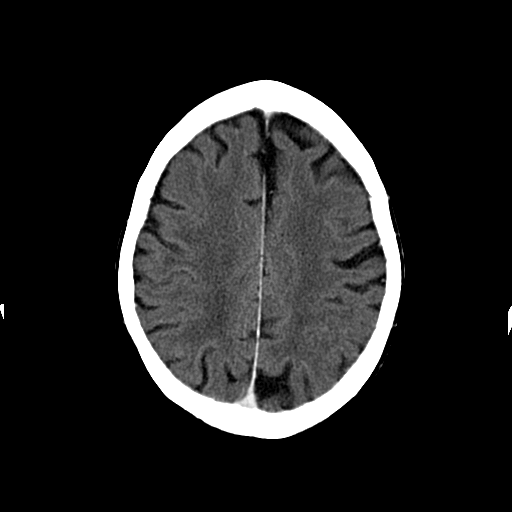
[im 22/31  brain]
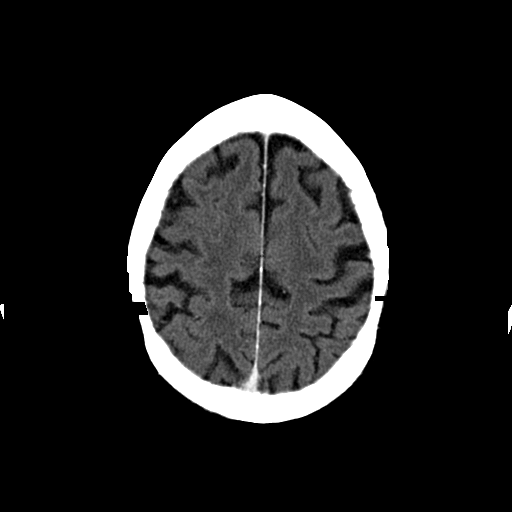
[im 26/31  brain]
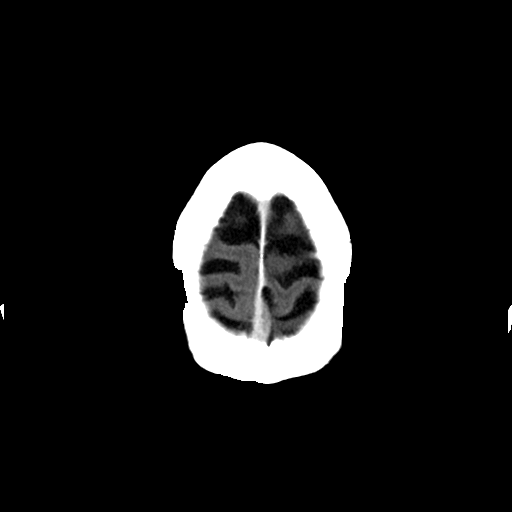
[im 26/31  bone]
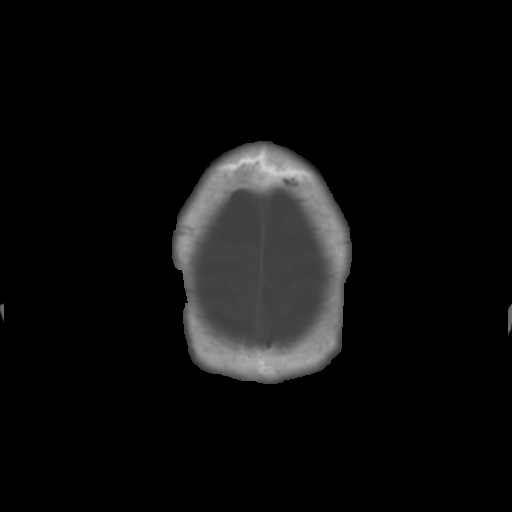
[im 28/31  brain]
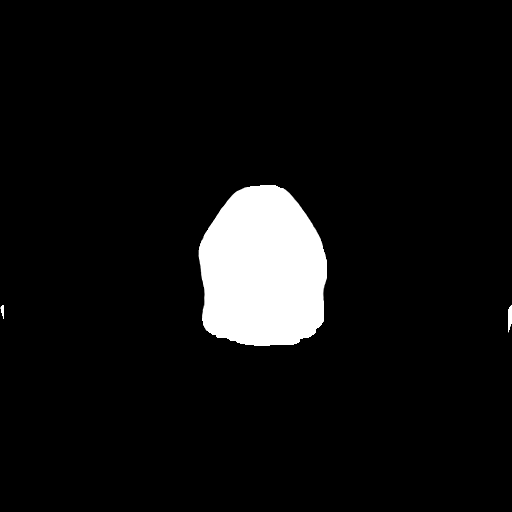

[Series 203: coronal st, idose (1) · coronal · 0.40mm/px · 3 of 74 slices shown]
[im 25/74  brain]
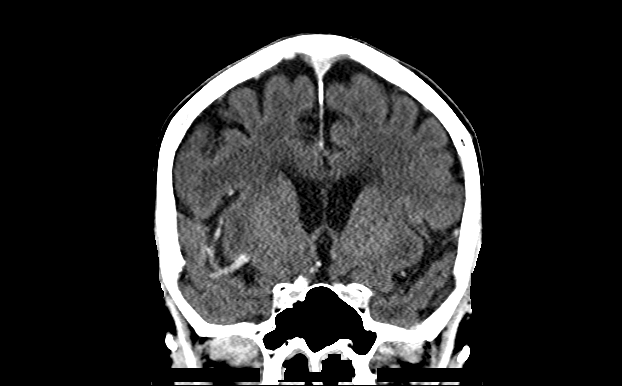
[im 33/74  brain]
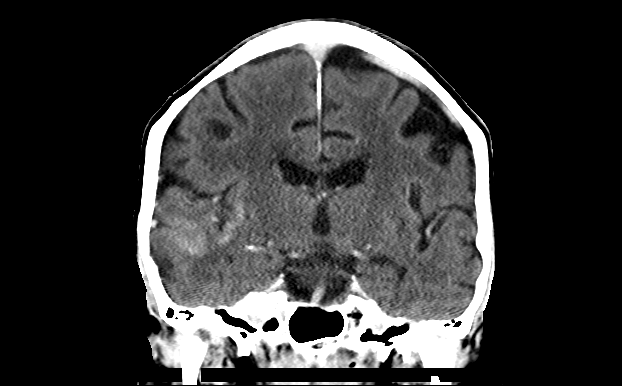
[im 41/74  brain]
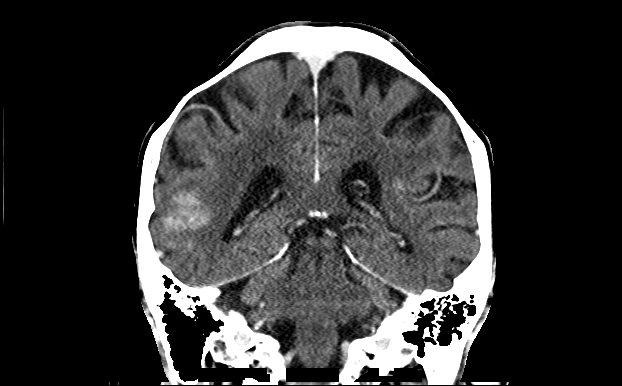

[Series 204: sagittal st, idose (1) · sagittal · 0.40mm/px · 3 of 83 slices shown]
[im 28/83  brain]
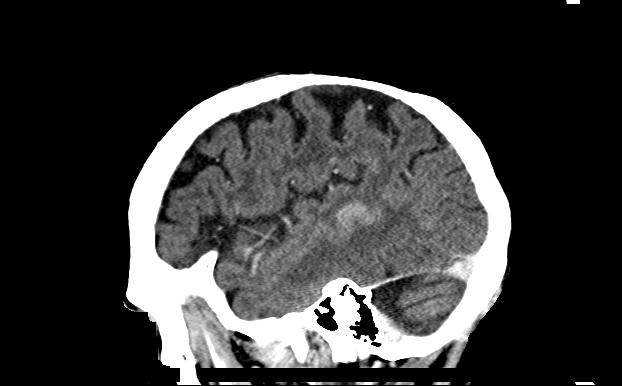
[im 42/83  brain]
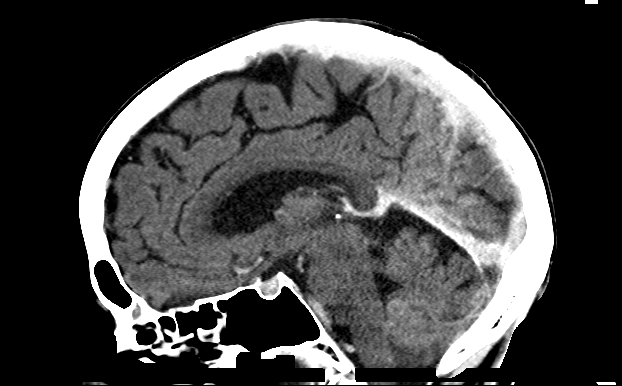
[im 55/83  brain]
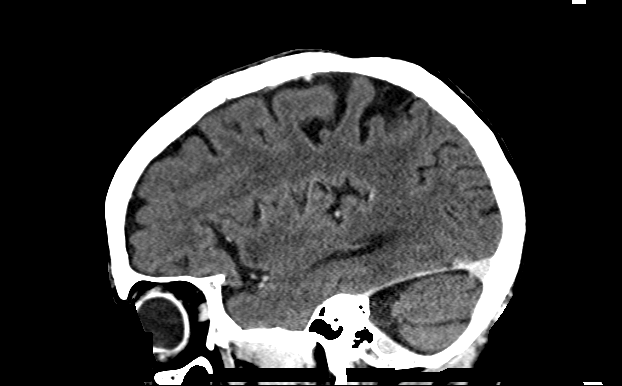

[16 of 47 positions shown; findings below may reference images not displayed]

FINDINGS: Brain: There is residual intravascular contrast from recent
angiography. Gyral hyper density in the right insula and right
temporal lobe suggests contrast staining from recent angiography
although a component of petechial hemorrhage is also possible. There
is mild gyral swelling in these regions. A subacute right cerebellar
infarct is unchanged. There is mild cerebral atrophy and moderate
chronic small vessel ischemic disease. Question infarction in the
right cerebral peduncle and thalamus on the earlier head CT is not
confirmed on this examination. There is no midline shift or
extra-axial fluid collection. No mass.

Vascular: Calcified atherosclerosis at the skullbase. Residual
intravascular contrast material.

Skull: No fracture or focal osseous lesion.

Sinuses/Orbits: Small volume fluid in the sphenoid sinuses. Small
left mastoid effusion, chronic in appearance. Prior bilateral
cataract extraction.

Other: None.
IMPRESSION: 1. Contrast staining and mild gyral swelling involving the right
insula and temporal lobe in the MCA territory. Superimposed
petechial hemorrhage also possible.
2. Subacute right cerebellar infarct.

## 2018-01-11 ENCOUNTER — Ambulatory Visit (INDEPENDENT_AMBULATORY_CARE_PROVIDER_SITE_OTHER): Payer: Medicare Other | Admitting: *Deleted

## 2018-01-11 DIAGNOSIS — R55 Syncope and collapse: Secondary | ICD-10-CM

## 2018-01-12 NOTE — Progress Notes (Signed)
Carelink Summary Report / Loop Recorder 

## 2018-01-15 IMAGING — DX DG CHEST 2V
3 series · 3 of 3 positions shown · non-contrast
Comparison: 08/11/2015.

CLINICAL DATA: Shortness of Breath

EXAM:
CHEST  2 VIEW

[w chest lat (1 of 2)]
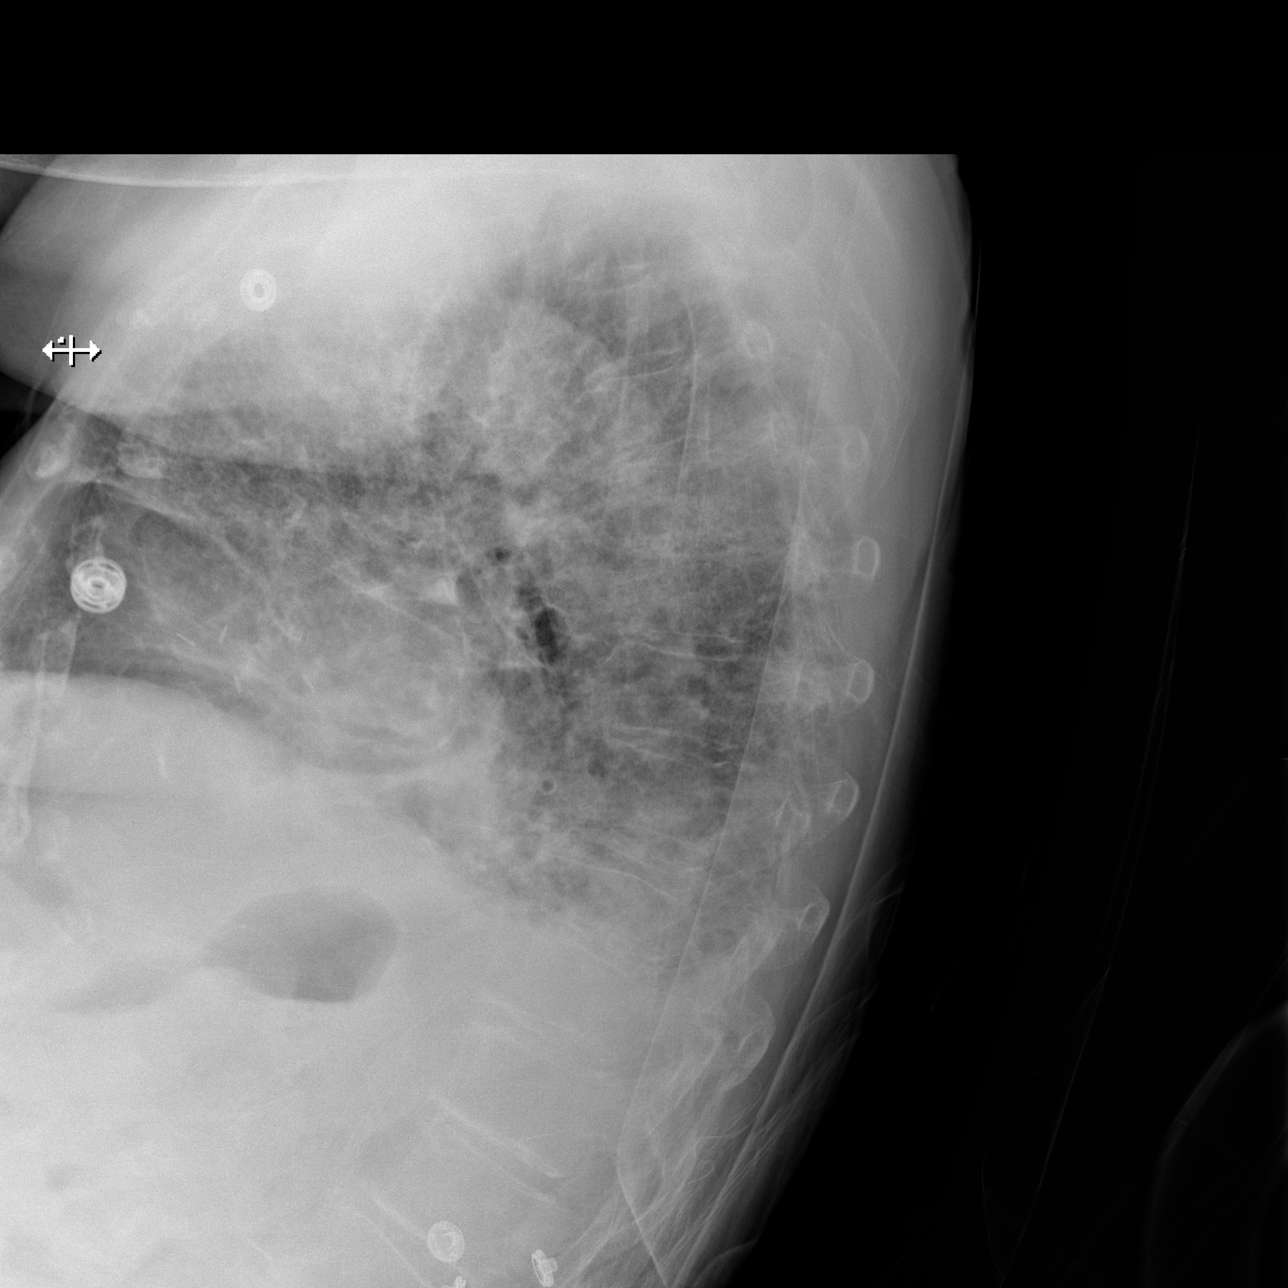

[w chest lat (2 of 2)]
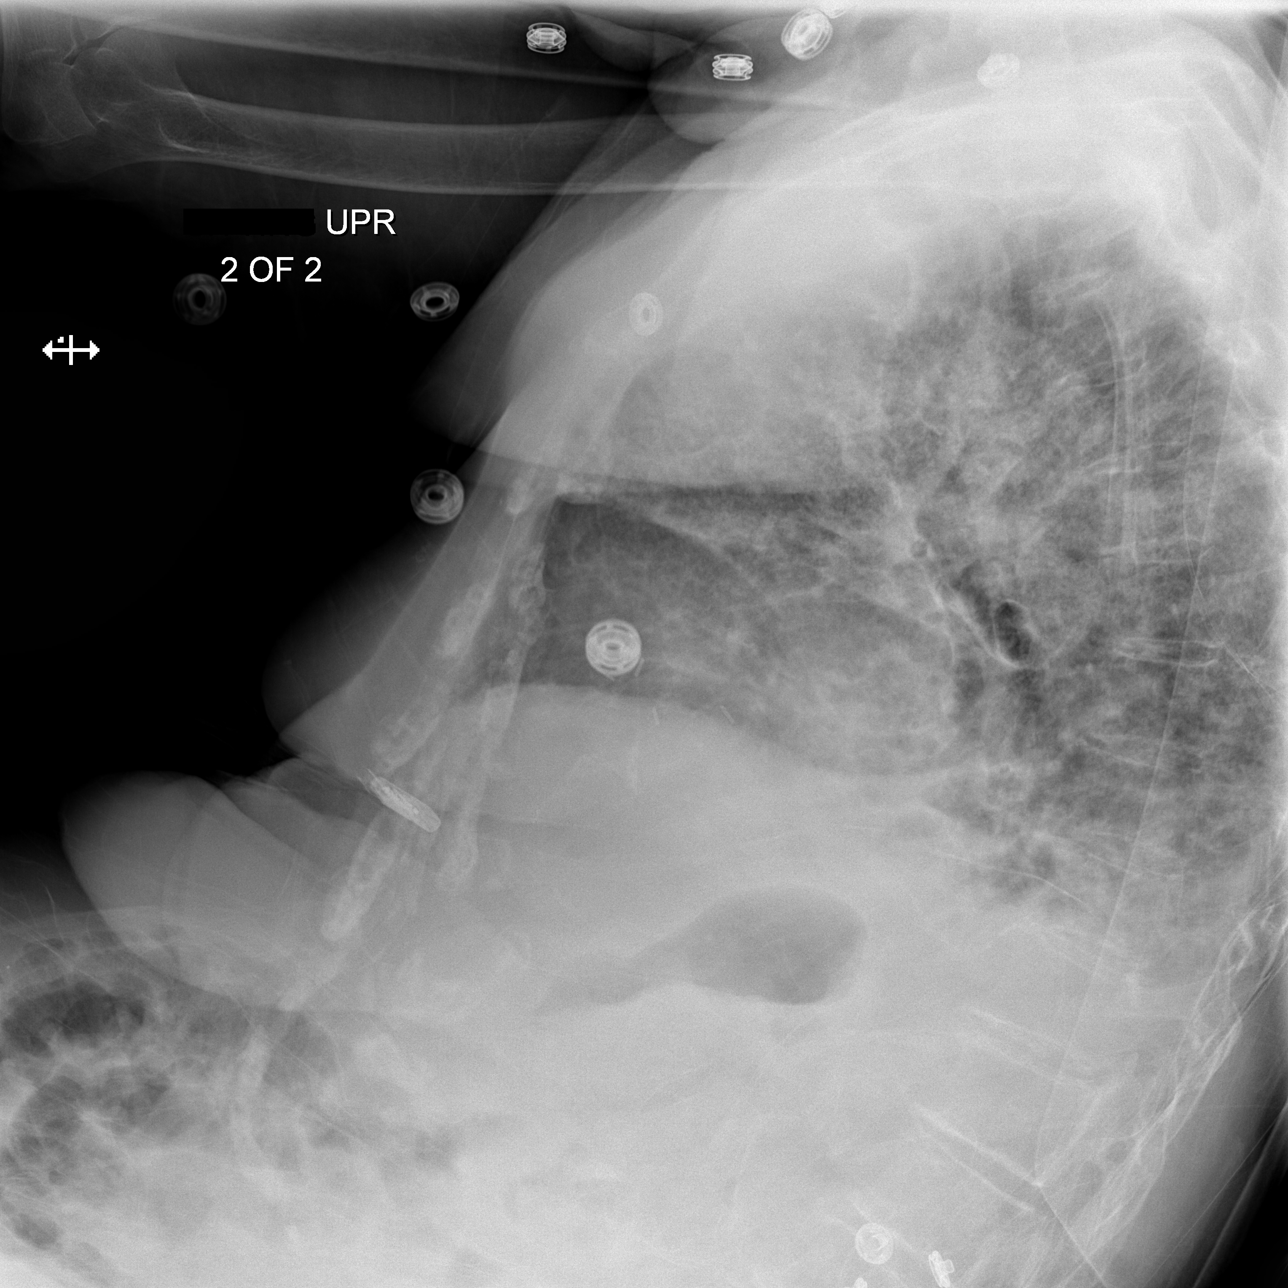

[x chest ap]
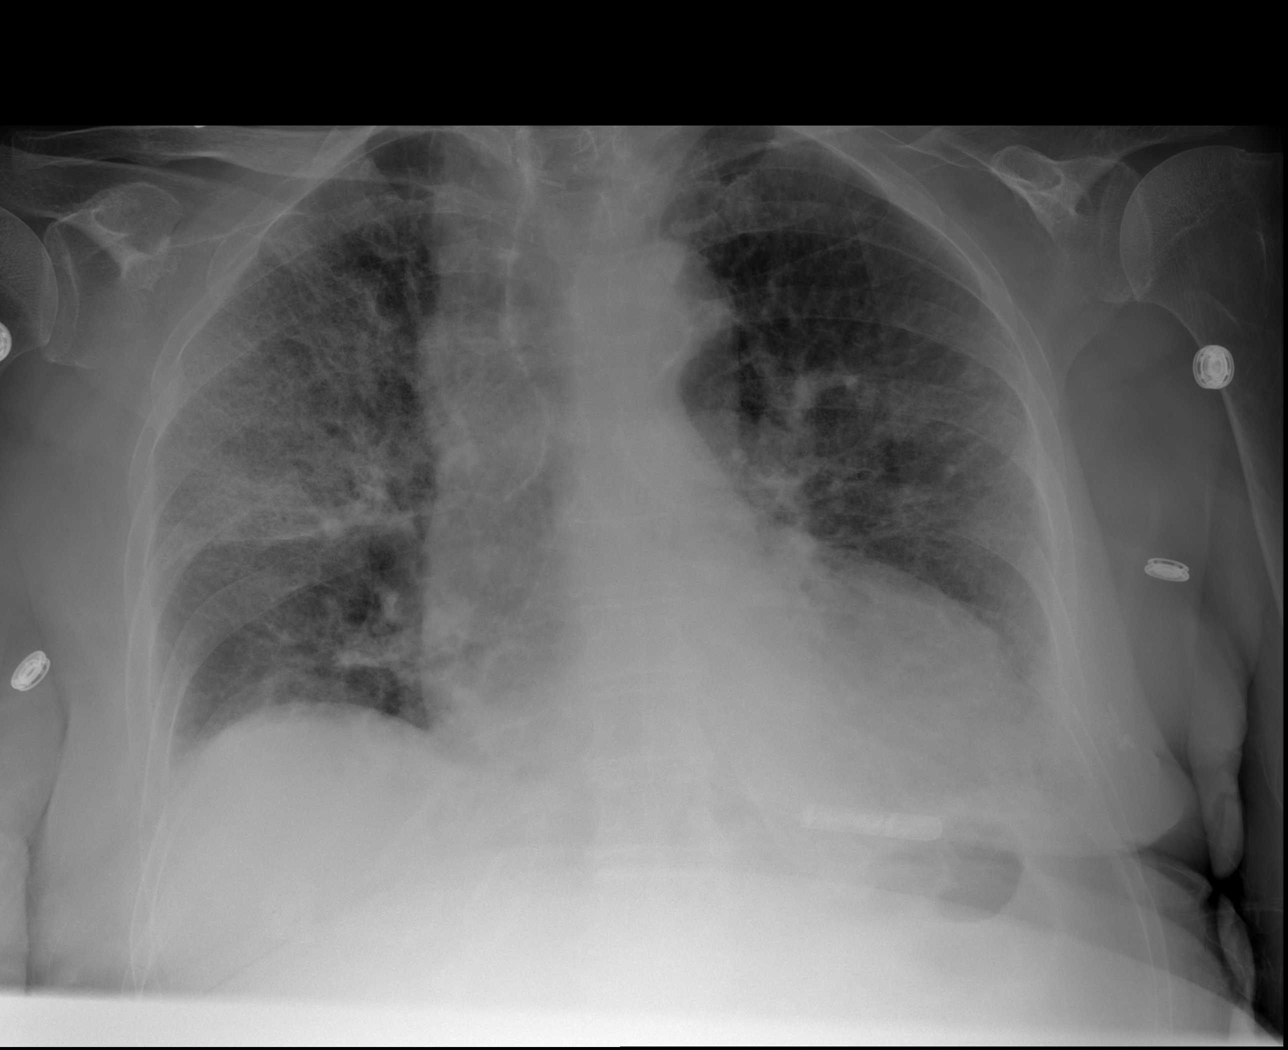

[3 of 3 positions shown; findings below may reference images not displayed]

FINDINGS: Cardiomegaly is noted. Mild interstitial prominence bilateral upper
lobes suspicious for mild interstitial edema. There is superimposed
airspace disease in right upper lobe suspicious for
infiltrate/pneumonia. Small bilateral pleural effusion with
bilateral basilar atelectasis or infiltrate best seen on lateral
view. Osteopenia and degenerative changes thoracic spine.
IMPRESSION: Mild interstitial prominence bilateral upper lobes suspicious for
mild interstitial edema. There is superimposed airspace disease in
right upper lobe suspicious for infiltrate/pneumonia. Small
bilateral pleural effusion with bilateral basilar atelectasis or
infiltrate best seen on lateral view.

## 2018-01-23 ENCOUNTER — Encounter: Payer: Self-pay | Admitting: Internal Medicine

## 2018-01-28 ENCOUNTER — Encounter: Payer: Self-pay | Admitting: Internal Medicine

## 2018-01-28 NOTE — Telephone Encounter (Signed)
Spoke to patient regarding her concerns about her increased HR. Patient states that she is asymptomatic but noticed that her HR is between 113-135bpm. She did not take her monitor with her on her trip, but she was in AF on her most recent ILR transmission (6/23). She asked if she should increase her Amio. I told her that I would forward the information to Dr.Allred, and we would call her back with recommendations. I instructed patient to keep taking her medications as prescribed until she hears back about the Amio. Patient verbalized understanding.

## 2018-01-31 ENCOUNTER — Other Ambulatory Visit: Payer: Self-pay | Admitting: Cardiology

## 2018-02-02 ENCOUNTER — Telehealth: Payer: Self-pay | Admitting: Cardiology

## 2018-02-02 NOTE — Telephone Encounter (Signed)
LMOVM requesting that pt send manual transmission b/c home monitor has not updated in at least 14 days.    

## 2018-02-07 ENCOUNTER — Encounter: Payer: Self-pay | Admitting: Internal Medicine

## 2018-02-07 DIAGNOSIS — J00 Acute nasopharyngitis [common cold]: Secondary | ICD-10-CM | POA: Diagnosis not present

## 2018-02-07 DIAGNOSIS — Z6825 Body mass index (BMI) 25.0-25.9, adult: Secondary | ICD-10-CM | POA: Diagnosis not present

## 2018-02-07 DIAGNOSIS — R05 Cough: Secondary | ICD-10-CM | POA: Diagnosis not present

## 2018-02-07 DIAGNOSIS — I482 Chronic atrial fibrillation: Secondary | ICD-10-CM | POA: Diagnosis not present

## 2018-02-10 ENCOUNTER — Encounter: Payer: Self-pay | Admitting: Internal Medicine

## 2018-02-10 NOTE — Telephone Encounter (Signed)
Called patient to let her know that her summary report will come through on 7/15 despite her being out of town. I explained to her that the report will include any information that was received over the previous month. Patient verbalized understanding and appreciation of information.  Patient states that she will return home on 02/17/18. Noted in [place].

## 2018-02-14 ENCOUNTER — Ambulatory Visit (INDEPENDENT_AMBULATORY_CARE_PROVIDER_SITE_OTHER): Payer: Medicare Other | Admitting: *Deleted

## 2018-02-14 DIAGNOSIS — R55 Syncope and collapse: Secondary | ICD-10-CM

## 2018-02-14 NOTE — Progress Notes (Signed)
Carelink Summary Report / Loop Recorder 

## 2018-02-17 LAB — CUP PACEART REMOTE DEVICE CHECK
Date Time Interrogation Session: 20190612003545
Implantable Pulse Generator Implant Date: 20170609

## 2018-03-03 ENCOUNTER — Telehealth: Payer: Self-pay | Admitting: Cardiology

## 2018-03-03 LAB — CUP PACEART REMOTE DEVICE CHECK
Implantable Pulse Generator Implant Date: 20170609
MDC IDC SESS DTM: 20190715003518

## 2018-03-03 MED ORDER — AMIODARONE HCL 100 MG PO TABS: 100.0000 mg | ORAL_TABLET | Freq: Every day | ORAL | 3 refills | Status: DC

## 2018-03-03 NOTE — Telephone Encounter (Signed)
Patient contacted office stating that she downloaded an app that states her HR was 140 last night. Patient stated she then checked it with her BP machine this morning. BP 111/65 HR 135. Patient states she feels fine. Patient denies any chest pain, shortness of breath and does not feel like her heart is racing. Med list reviewed and patient states she is compliant. Patient states she has only been aware of this last night and today. Will forward to provider.

## 2018-03-03 NOTE — Telephone Encounter (Signed)
The July Summary Report has been processed and it is now in her EMR. Average V rates have been elevated since early June. 4.6% AT/AF.

## 2018-03-03 NOTE — Telephone Encounter (Signed)
Increase amiodarone back to 100 mg daily.

## 2018-03-03 NOTE — Telephone Encounter (Signed)
She has known paroxysmal atrial arrhythmias, both fibrillation and flutter.  Last time her ILR was interrogated in June she had 6.4% recurrence.  I do not see a report on the interrogation from July.  I suspect the elevated heart rates correspond with a recurrent atrial event.  Please try and get her device interrogation from July interpreted, I suspect we need to increase her amiodarone back to 100 mg a day.

## 2018-03-03 NOTE — Telephone Encounter (Signed)
Patient called stating that she has an App on her phone in regards to her heart rate. States that her HR last evening at bedtime was 140. This Am HR was 135. Please call [phone].

## 2018-03-03 NOTE — Telephone Encounter (Signed)
Will send to device clinic

## 2018-03-03 NOTE — Telephone Encounter (Signed)
Patient notified and verbalized understanding. 

## 2018-03-05 NOTE — Telephone Encounter (Signed)
We will offer follow-up with EP team in next 1-2 weeks (either MD, APP, or AF clinic)

## 2018-03-07 NOTE — Telephone Encounter (Signed)
Spoke with patient.  She reports that her HR is still elevated, but she remains asymptomatic.  She has increased her amiodarone back to 100mg  daily and is taking Toprol-XL as prescribed.  Patient can arrange transportation to [place] on M/W/F.  She is agreeable to an appointment with Roderic Palau, NP, in the AF Clinic on 03/11/18 at 10:00am.  Patient is aware of contact info and parking instructions.  She agrees to call back in the interim with any questions, concerns, or worsening symptoms.

## 2018-03-07 NOTE — Telephone Encounter (Signed)
Discussed with Dr. Letta Kocher to reprogram tachy detection rate from 167bpm to 120pm so that we receive alerts for elevated V rates (better detection of A-fib/A-flutter).  Information forwarded to [employer] reps for review and any other recommendations.  Will also request rep's presence at upcoming appointment for ILR reprogramming.

## 2018-03-08 ENCOUNTER — Telehealth: Payer: Self-pay | Admitting: *Deleted

## 2018-03-08 NOTE — Telephone Encounter (Signed)
Noted. Will continue to monitor.

## 2018-03-08 NOTE — Telephone Encounter (Signed)
Spoke with patient regarding pause episode noted on LINQ 03/07/18 at 14:22. ECG appears to be sinus pauses upon termination of AF. Per telephone note 03/03/18, patient increased amiodarone back to 100mg  daily and is taking Toprol-XL as prescribed. Patient states she noticed yesterday afternoon her HR had decreased back into the 60's and she is feeling much better. She states no syncope or presyncope with episode. Advised patient we would review episode with Dr. Rayann Heman and Dr. Domenic Polite and reminded patient of appointment with Roderic Palau in the AFib Clinic 03/11/18 at 10:00am. Patient aware to call if she experiences syncope or presyncope symptoms.

## 2018-03-11 ENCOUNTER — Encounter (HOSPITAL_COMMUNITY): Payer: Self-pay | Admitting: Nurse Practitioner

## 2018-03-11 ENCOUNTER — Ambulatory Visit (HOSPITAL_COMMUNITY)
Admission: RE | Admit: 2018-03-11 | Discharge: 2018-03-11 | Disposition: A | Discharge status: Home / Self Care | Payer: Medicare Other | Source: Ambulatory Visit | Attending: Nurse Practitioner | Admitting: Nurse Practitioner

## 2018-03-11 VITALS — BP 124/68 | HR 58 | Ht 63.0 in | Wt 147.0 lb

## 2018-03-11 DIAGNOSIS — E039 Hypothyroidism, unspecified: Secondary | ICD-10-CM | POA: Insufficient documentation

## 2018-03-11 DIAGNOSIS — R55 Syncope and collapse: Secondary | ICD-10-CM | POA: Insufficient documentation

## 2018-03-11 DIAGNOSIS — Z90710 Acquired absence of both cervix and uterus: Secondary | ICD-10-CM | POA: Insufficient documentation

## 2018-03-11 DIAGNOSIS — I481 Persistent atrial fibrillation: Secondary | ICD-10-CM

## 2018-03-11 DIAGNOSIS — Z7901 Long term (current) use of anticoagulants: Secondary | ICD-10-CM | POA: Insufficient documentation

## 2018-03-11 DIAGNOSIS — Z8673 Personal history of transient ischemic attack (TIA), and cerebral infarction without residual deficits: Secondary | ICD-10-CM | POA: Diagnosis not present

## 2018-03-11 DIAGNOSIS — Z823 Family history of stroke: Secondary | ICD-10-CM | POA: Insufficient documentation

## 2018-03-11 DIAGNOSIS — Z79899 Other long term (current) drug therapy: Secondary | ICD-10-CM | POA: Diagnosis not present

## 2018-03-11 DIAGNOSIS — Z8249 Family history of ischemic heart disease and other diseases of the circulatory system: Secondary | ICD-10-CM | POA: Diagnosis not present

## 2018-03-11 DIAGNOSIS — I10 Essential (primary) hypertension: Secondary | ICD-10-CM | POA: Diagnosis not present

## 2018-03-11 DIAGNOSIS — I4819 Other persistent atrial fibrillation: Secondary | ICD-10-CM

## 2018-03-11 DIAGNOSIS — I480 Paroxysmal atrial fibrillation: Secondary | ICD-10-CM | POA: Insufficient documentation

## 2018-03-11 DIAGNOSIS — Z9889 Other specified postprocedural states: Secondary | ICD-10-CM | POA: Diagnosis not present

## 2018-03-11 DIAGNOSIS — I484 Atypical atrial flutter: Secondary | ICD-10-CM | POA: Diagnosis not present

## 2018-03-11 DIAGNOSIS — Z791 Long term (current) use of non-steroidal anti-inflammatories (NSAID): Secondary | ICD-10-CM | POA: Insufficient documentation

## 2018-03-11 NOTE — Progress Notes (Signed)
Primary Care Physician: Caryl Bis, MD Referring Physician: Dr. Audelia Acton is a 84 y.o. female with a h/o afib that noted elevated v rates since back in June.  Recently, she increased her amiodarone to 100 mg daily and BB was continued at current dose.She is in the afib clinic today and she has returned to [facility]. Dr. Rayann Heman requested that AF detection rate be lowered to 120 vrs her current 167. [employer] in to adjust. She is aware that she is back in rhythm  by her HR monitor app on her phone, but did not feel any different in afib than in SR.   Today, she denies symptoms of palpitations, chest pain, shortness of breath, orthopnea, PND, lower extremity edema, dizziness, presyncope, syncope, or neurologic sequela. The patient is tolerating medications without difficulties and is otherwise without complaint today.   Past Medical History:  Diagnosis Date  . Aortic insufficiency    Mild  . Aortic stenosis    Moderate to severe  . 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  s/p Left brachial, radial, ulnar, axillary embolectomy 08/2016.  Marland Kitchen Atypical atrial flutter ([place])   . Embolic stroke involving right cerebellar artery ([place])   . Essential hypertension   . Hypothyroidism   . Ischemia of extremity   . Memory loss 11/26/2016  . Moderate to severe aortic stenosis   . PAF (paroxysmal atrial fibrillation) ([place])   . Paroxysmal atrial tachycardia ([place])   . Stroke ([place]) 08/2016  . Syncope    a. s/p ILR 01/2016.   Past Surgical History:  Procedure Laterality Date  . APPENDECTOMY    . BREAST LUMPECTOMY    . dental implant    . EP IMPLANTABLE DEVICE N/A 01/10/2016   Procedure: Loop Recorder Insertion;  Surgeon: [name], MD;  Location: [facility];  Service: Cardiovascular;  Laterality: N/A;  . IR GENERIC HISTORICAL  08/23/2016   IR US GUIDE VASC ACCESS RIGHT 08/23/2016 Corrie Mckusick, DO [facility]  . IR GENERIC HISTORICAL  08/23/2016   IR  PERCUTANEOUS ART THROMBECTOMY/INFUSION INTRACRANIAL INC DIAG ANGIO 08/23/2016 Corrie Mckusick, DO [facility]  . IR GENERIC HISTORICAL  08/23/2016   IR ANGIO INTRA EXTRACRAN SEL COM CAROTID INNOMINATE UNI L MOD SED 08/23/2016 Corrie Mckusick, DO [facility]  . RADIOLOGY WITH ANESTHESIA Bilateral 08/23/2016   Procedure: RADIOLOGY WITH ANESTHESIA;  Surgeon: Medication Radiologist, MD;  Location: [facility];  Service: Radiology;  Laterality: Bilateral;  . THROMBECTOMY BRACHIAL ARTERY Left 08/23/2016   Procedure: Left Radial, Ulnar, Brachial and Axillary Embolectomy;  Surgeon: Elam Dutch, MD;  Location: [facility];  Service: Vascular;  Laterality: Left;  . THYROIDECTOMY    . TONSILLECTOMY    . TOTAL ABDOMINAL HYSTERECTOMY    . VITRECTOMY AND CATARACT      Current Outpatient Medications  Medication Sig Dispense Refill  . amiodarone (PACERONE) 100 MG tablet Take 1 tablet (100 mg total) by mouth daily. 30 tablet 3  . apixaban (ELIQUIS) 5 MG TABS tablet Take 1 tablet (5 mg total) by mouth 2 (two) times daily. 180 tablet 1  . hydrocortisone (ANUSOL-HC) 25 MG suppository Place 1 suppository (25 mg total) rectally 2 (two) times daily. 6 suppository 0  . levothyroxine (SYNTHROID, LEVOTHROID) 112 MCG tablet Take 1 tablet (112 mcg total) by mouth daily before breakfast. 30 tablet 0  . Lutein-Zeaxanthin 25-5 MG CAPS Take 1 capsule by mouth 2 (two) times daily.    . metoprolol  succinate (TOPROL-XL) 25 MG 24 hr tablet TAKE 0.5 TABLETS (12.5 MG TOTAL) BY MOUTH 2 (TWO) TIMES DAILY. 30 tablet 3  . Multiple Vitamin (MULTIVITAMIN) tablet Take 1 tablet by mouth daily.      Marland Kitchen triamcinolone cream (KENALOG) 0.1 % Apply 1 application topically daily as needed (irritated skin).     Marland Kitchen triamterene-hydrochlorothiazide (DYAZIDE) 37.5-25 MG capsule Take 1 each (1 capsule total) by mouth daily. 30 capsule 0   No current facility-administered medications for this encounter.     No Known Allergies  Social History   Socioeconomic  History  . Marital status: Widowed    Spouse name: Not on file  . Number of children: Not on file  . Years of education: Not on file  . Highest education level: Not on file  Occupational History  . Occupation: Retired  Scientific laboratory technician  . Financial resource strain: Not on file  . Food insecurity:    Worry: Not on file    Inability: Not on file  . Transportation needs:    Medical: Not on file    Non-medical: Not on file  Tobacco Use  . Smoking status: Never Smoker  . Smokeless tobacco: Never Used  Substance and Sexual Activity  . Alcohol use: No    Alcohol/week: 0.0 standard drinks  . Drug use: No  . Sexual activity: Not on file  Lifestyle  . Physical activity:    Days per week: Not on file    Minutes per session: Not on file  . Stress: Not on file  Relationships  . Social connections:    Talks on phone: Not on file    Gets together: Not on file    Attends religious service: Not on file    Active member of club or organization: Not on file    Attends meetings of clubs or organizations: Not on file    Relationship status: Not on file  . Intimate partner violence:    Fear of current or ex partner: Not on file    Emotionally abused: Not on file    Physically abused: Not on file    Forced sexual activity: Not on file  Other Topics Concern  . Not on file  Social History Narrative   Pt lives in [place] alone.   Widowed   Retired Network engineer    Family History  Problem Relation Age of Onset  . Coronary artery disease Unknown   . Stroke Mother   . Stroke Sister     ROS- All systems are reviewed and negative except as per the HPI above  Physical Exam: Vitals:   03/11/18 1008  BP: 124/68  Pulse: (!) 58  Weight: 66.7 kg  Height: 5\' 3"  (1.6 m)   Wt Readings from Last 3 Encounters:  03/11/18 66.7 kg  12/06/17 67.1 kg  11/05/17 66.6 kg    Labs: Lab Results  Component Value Date   NA 137 09/04/2016   K 3.6 09/04/2016   CL 100 (L) 09/04/2016   CO2 26 09/04/2016    GLUCOSE 91 09/04/2016   BUN 23 (H) 09/04/2016   CREATININE 0.89 09/04/2016   CALCIUM 8.8 (L) 09/04/2016   PHOS 1.9 (L) 08/28/2016   MG 1.7 09/01/2016   Lab Results  Component Value Date   INR 1.00 08/23/2016   Lab Results  Component Value Date   CHOL 164 08/25/2016   HDL 56 08/25/2016   LDLCALC 94 08/25/2016   TRIG 69 08/25/2016     GEN- The patient  is well appearing, alert and oriented x 3 today.   Head- normocephalic, atraumatic Eyes-  Sclera clear, conjunctiva pink Ears- hearing intact Oropharynx- clear Neck- supple, no JVP Lymph- no cervical lymphadenopathy Lungs- Clear to ausculation bilaterally, normal work of breathing Heart- Regular rate and rhythm, no murmurs, rubs or gallops, PMI not laterally displaced GI- soft, NT, ND, + BS Extremities- no clubbing, cyanosis, or edema MS- no significant deformity or atrophy Skin- no rash or lesion Psych- euthymic mood, full affect Neuro- strength and sensation are intact  EKG- sinus brady at 58 bpm, pr int 216 ms, qrs int 96 ms, qtc 429 ms Medtronic interrogation reviewed and showed 2 conversion pauses, will be sent to Dr. Rayann Heman for review Detection rate reprogrammed today to 120 for afib   Assessment and Plan: 1.Persisitent afib Noted on monitor in June Has returned to [place] with increase of amiodarone to 100 mg daily Continue this dose and continue BB at current dose Continue eliquis at 5 mg bid   2. Previous syncope Per linq No further episodes Did have 2 conversion pauses but pt was asymptomatic Reprogrammed by Tomi Bamberger, detection rate lowered to 120 from around 160 Report  sent to Dr. Rayann Heman for review  Remote check 8/16 Dr. Domenic Polite 11/11  Butch Penny C. Aliese Brannum, [facility] [address] [place], [place] [address] [phone]

## 2018-03-14 ENCOUNTER — Other Ambulatory Visit: Payer: Self-pay

## 2018-03-18 ENCOUNTER — Ambulatory Visit (INDEPENDENT_AMBULATORY_CARE_PROVIDER_SITE_OTHER): Payer: Medicare Other | Admitting: *Deleted

## 2018-03-18 DIAGNOSIS — R55 Syncope and collapse: Secondary | ICD-10-CM | POA: Diagnosis not present

## 2018-03-21 NOTE — Progress Notes (Signed)
Carelink Summary Report / Loop Recorder 

## 2018-03-31 DIAGNOSIS — Z1389 Encounter for screening for other disorder: Secondary | ICD-10-CM | POA: Diagnosis not present

## 2018-03-31 DIAGNOSIS — Z6825 Body mass index (BMI) 25.0-25.9, adult: Secondary | ICD-10-CM | POA: Diagnosis not present

## 2018-03-31 DIAGNOSIS — E039 Hypothyroidism, unspecified: Secondary | ICD-10-CM | POA: Diagnosis not present

## 2018-03-31 DIAGNOSIS — R5383 Other fatigue: Secondary | ICD-10-CM | POA: Diagnosis not present

## 2018-03-31 DIAGNOSIS — I480 Paroxysmal atrial fibrillation: Secondary | ICD-10-CM | POA: Diagnosis not present

## 2018-03-31 DIAGNOSIS — I10 Essential (primary) hypertension: Secondary | ICD-10-CM | POA: Diagnosis not present

## 2018-03-31 DIAGNOSIS — E782 Mixed hyperlipidemia: Secondary | ICD-10-CM | POA: Diagnosis not present

## 2018-03-31 DIAGNOSIS — Z0001 Encounter for general adult medical examination with abnormal findings: Secondary | ICD-10-CM | POA: Diagnosis not present

## 2018-03-31 DIAGNOSIS — I482 Chronic atrial fibrillation: Secondary | ICD-10-CM | POA: Diagnosis not present

## 2018-03-31 DIAGNOSIS — Z9189 Other specified personal risk factors, not elsewhere classified: Secondary | ICD-10-CM | POA: Diagnosis not present

## 2018-03-31 DIAGNOSIS — E876 Hypokalemia: Secondary | ICD-10-CM | POA: Diagnosis not present

## 2018-04-07 DIAGNOSIS — Z6825 Body mass index (BMI) 25.0-25.9, adult: Secondary | ICD-10-CM | POA: Diagnosis not present

## 2018-04-07 DIAGNOSIS — I8391 Asymptomatic varicose veins of right lower extremity: Secondary | ICD-10-CM | POA: Diagnosis not present

## 2018-04-07 DIAGNOSIS — E039 Hypothyroidism, unspecified: Secondary | ICD-10-CM | POA: Diagnosis not present

## 2018-04-07 DIAGNOSIS — Z23 Encounter for immunization: Secondary | ICD-10-CM | POA: Diagnosis not present

## 2018-04-07 DIAGNOSIS — I10 Essential (primary) hypertension: Secondary | ICD-10-CM | POA: Diagnosis not present

## 2018-04-07 DIAGNOSIS — N183 Chronic kidney disease, stage 3 (moderate): Secondary | ICD-10-CM | POA: Diagnosis not present

## 2018-04-07 DIAGNOSIS — I482 Chronic atrial fibrillation: Secondary | ICD-10-CM | POA: Diagnosis not present

## 2018-04-07 DIAGNOSIS — E782 Mixed hyperlipidemia: Secondary | ICD-10-CM | POA: Diagnosis not present

## 2018-04-19 ENCOUNTER — Other Ambulatory Visit: Payer: Self-pay | Admitting: Internal Medicine

## 2018-04-20 ENCOUNTER — Ambulatory Visit (INDEPENDENT_AMBULATORY_CARE_PROVIDER_SITE_OTHER): Payer: Medicare Other | Admitting: *Deleted

## 2018-04-20 DIAGNOSIS — R55 Syncope and collapse: Secondary | ICD-10-CM

## 2018-04-21 NOTE — Progress Notes (Signed)
Carelink Summary Report / Loop Recorder 

## 2018-04-25 LAB — CUP PACEART REMOTE DEVICE CHECK
Date Time Interrogation Session: 20190817003756
MDC IDC PG IMPLANT DT: 20170609

## 2018-05-02 LAB — CUP PACEART REMOTE DEVICE CHECK
Date Time Interrogation Session: 20190919013533
MDC IDC PG IMPLANT DT: 20170609

## 2018-05-04 DIAGNOSIS — H35362 Drusen (degenerative) of macula, left eye: Secondary | ICD-10-CM | POA: Diagnosis not present

## 2018-05-04 DIAGNOSIS — H353221 Exudative age-related macular degeneration, left eye, with active choroidal neovascularization: Secondary | ICD-10-CM | POA: Diagnosis not present

## 2018-05-04 DIAGNOSIS — G453 Amaurosis fugax: Secondary | ICD-10-CM | POA: Diagnosis not present

## 2018-05-04 DIAGNOSIS — H353132 Nonexudative age-related macular degeneration, bilateral, intermediate dry stage: Secondary | ICD-10-CM | POA: Diagnosis not present

## 2018-05-14 ENCOUNTER — Other Ambulatory Visit: Payer: Self-pay | Admitting: Cardiology

## 2018-05-23 ENCOUNTER — Ambulatory Visit (INDEPENDENT_AMBULATORY_CARE_PROVIDER_SITE_OTHER): Payer: Medicare Other | Admitting: *Deleted

## 2018-05-23 ENCOUNTER — Other Ambulatory Visit: Payer: Self-pay | Admitting: Cardiology

## 2018-05-23 DIAGNOSIS — R55 Syncope and collapse: Secondary | ICD-10-CM

## 2018-05-23 NOTE — Telephone Encounter (Signed)
This is a [facility] pt °

## 2018-05-24 NOTE — Progress Notes (Signed)
Carelink Summary Report / Loop Recorder 

## 2018-05-29 ENCOUNTER — Other Ambulatory Visit: Payer: Self-pay | Admitting: Cardiology

## 2018-06-08 DIAGNOSIS — G453 Amaurosis fugax: Secondary | ICD-10-CM | POA: Diagnosis not present

## 2018-06-08 DIAGNOSIS — H353221 Exudative age-related macular degeneration, left eye, with active choroidal neovascularization: Secondary | ICD-10-CM | POA: Diagnosis not present

## 2018-06-08 DIAGNOSIS — H353132 Nonexudative age-related macular degeneration, bilateral, intermediate dry stage: Secondary | ICD-10-CM | POA: Diagnosis not present

## 2018-06-08 DIAGNOSIS — H35362 Drusen (degenerative) of macula, left eye: Secondary | ICD-10-CM | POA: Diagnosis not present

## 2018-06-10 LAB — CUP PACEART REMOTE DEVICE CHECK
Date Time Interrogation Session: 20191022020939
MDC IDC PG IMPLANT DT: 20170609

## 2018-06-10 NOTE — Progress Notes (Signed)
Cardiology Office Note  Date: 06/13/2018   ID: Christine Pugh, DOB 1922-04-16, MRN [id number]  PCP: Caryl Bis, MD  Primary Cardiologist: Rozann Lesches, MD   Chief Complaint  Patient presents with  . Aortic Stenosis    History of Present Illness: Christine Pugh is a 84 y.o. female last seen in May.  Since then she had increasing heart rates and recurrent atrial arrhythmias resulting in up titration of amiodarone and ultimately a visit in the Atrial Fibrillation clinic at the direction of Dr. Rayann Heman.  By that time in August she had reverted to sinus rhythm.  She presents today for follow-up, reports no palpitations.  She has an app on her phone that she can use to check her heart rate, it has been normal recently.  She does not report any dizziness or syncope.  She states that she had follow-up lab work with Dr. Quillian Quince earlier this year, we will request for review.   I reviewed her medications, cardiac regimen includes Eliquis, amiodarone, and Toprol-XL as before.   She does tell me that her daughter passed away at age 11 from ovarian cancer in mid October.  Past Medical History:  Diagnosis Date  . Aortic insufficiency    Mild  . Aortic stenosis    Moderate to severe  . 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  s/p Left brachial, radial, ulnar, axillary embolectomy 08/2016.  Marland Kitchen Atypical atrial flutter ([place])   . Embolic stroke involving right cerebellar artery ([place])   . Essential hypertension   . Hypothyroidism   . Ischemia of extremity   . Memory loss 11/26/2016  . Moderate to severe aortic stenosis   . PAF (paroxysmal atrial fibrillation) ([place])   . Paroxysmal atrial tachycardia ([place])   . Stroke ([place]) 08/2016  . Syncope    a. s/p ILR 01/2016.    Past Surgical History:  Procedure Laterality Date  . APPENDECTOMY    . BREAST LUMPECTOMY    . dental implant    . EP IMPLANTABLE DEVICE N/A 01/10/2016   Procedure: Loop Recorder Insertion;   Surgeon: [name], MD;  Location: [facility];  Service: Cardiovascular;  Laterality: N/A;  . IR GENERIC HISTORICAL  08/23/2016   IR US GUIDE VASC ACCESS RIGHT 08/23/2016 Corrie Mckusick, DO [facility]  . IR GENERIC HISTORICAL  08/23/2016   IR PERCUTANEOUS ART THROMBECTOMY/INFUSION INTRACRANIAL INC DIAG ANGIO 08/23/2016 Corrie Mckusick, DO [facility]  . IR GENERIC HISTORICAL  08/23/2016   IR ANGIO INTRA EXTRACRAN SEL COM CAROTID INNOMINATE UNI L MOD SED 08/23/2016 Corrie Mckusick, DO [facility]  . RADIOLOGY WITH ANESTHESIA Bilateral 08/23/2016   Procedure: RADIOLOGY WITH ANESTHESIA;  Surgeon: Medication Radiologist, MD;  Location: [facility];  Service: Radiology;  Laterality: Bilateral;  . THROMBECTOMY BRACHIAL ARTERY Left 08/23/2016   Procedure: Left Radial, Ulnar, Brachial and Axillary Embolectomy;  Surgeon: Elam Dutch, MD;  Location: [facility];  Service: Vascular;  Laterality: Left;  . THYROIDECTOMY    . TONSILLECTOMY    . TOTAL ABDOMINAL HYSTERECTOMY    . VITRECTOMY AND CATARACT      Current Outpatient Medications  Medication Sig Dispense Refill  . amiodarone (PACERONE) 100 MG tablet TAKE 1 TABLET BY MOUTH EVERY DAY 90 tablet 1  . ELIQUIS 5 MG TABS tablet TAKE 1 TABLET BY MOUTH TWICE A DAY 180 tablet 1  . hydrocortisone (ANUSOL-HC) 25 MG suppository Place 1 suppository (25 mg total) rectally 2 (two) times daily.  6 suppository 0  . levothyroxine (SYNTHROID, LEVOTHROID) 112 MCG tablet Take 1 tablet (112 mcg total) by mouth daily before breakfast. 30 tablet 0  . Lutein-Zeaxanthin 25-5 MG CAPS Take 1 capsule by mouth 2 (two) times daily.    . metoprolol succinate (TOPROL-XL) 25 MG 24 hr tablet TAKE 0.5 TABLETS (12.5 MG TOTAL) BY MOUTH 2 (TWO) TIMES DAILY. 60 tablet 3  . Multiple Vitamin (MULTIVITAMIN) tablet Take 1 tablet by mouth daily.      Marland Kitchen triamcinolone cream (KENALOG) 0.1 % Apply 1 application topically daily as needed (irritated skin).     Marland Kitchen triamterene-hydrochlorothiazide (DYAZIDE)  37.5-25 MG capsule Take 1 each (1 capsule total) by mouth daily. 30 capsule 0   No current facility-administered medications for this visit.    Allergies:  Patient has no known allergies.   Social History: The patient  reports that she has never smoked. She has never used smokeless tobacco. She reports that she does not drink alcohol or use drugs.   ROS:  Please see the history of present illness. Otherwise, complete review of systems is positive for hearing loss.  All other systems are reviewed and negative.   Physical Exam: VS:  BP 124/68   Pulse (!) 55   Ht 5\' 3"  (1.6 m)   Wt 146 lb (66.2 kg)   SpO2 93%   BMI 25.86 kg/m , BMI Body mass index is 25.86 kg/m.  Wt Readings from Last 3 Encounters:  06/13/18 146 lb (66.2 kg)  03/11/18 147 lb (66.7 kg)  12/06/17 148 lb (67.1 kg)    General: Elderly woman, patient appears comfortable at rest. HEENT: Conjunctiva and lids normal, oropharynx clear. Neck: Supple, no elevated JVP or carotid bruits, no thyromegaly. Lungs: Clear to auscultation, nonlabored breathing at rest. Cardiac: Regular rate and rhythm, no S3, 3/6 systolic murmur consistent with aortic stenosis. Abdomen: Soft, nontender, bowel sounds present. Extremities: Chronic venous stasis, distal pulses 2+. Skin: Warm and dry. Musculoskeletal: Kyphosis. Neuropsychiatric: Alert and oriented x3, affect grossly appropriate.  ECG: I personally reviewed the tracing from 03/11/2018 which showed sinus arrhythmia with prolonged PR interval, left anterior fascicular block, nonspecific ST-T changes.  Recent Labwork:    Component Value Date/Time   CHOL 164 08/25/2016 0331   TRIG 69 08/25/2016 0331   HDL 56 08/25/2016 0331   CHOLHDL 2.9 08/25/2016 0331   VLDL 14 08/25/2016 0331   LDLCALC 94 08/25/2016 0331    Other Studies Reviewed Today:  Echocardiogram 08/24/2016: Study Conclusions  - Left ventricle: The cavity size was normal. There was mild   concentric hypertrophy.  Systolic function was vigorous. The   estimated ejection fraction was in the range of 65% to 70%. Wall   motion was normal; there were no regional wall motion   abnormalities. Features are consistent with a pseudonormal left   ventricular filling pattern, with concomitant abnormal relaxation   and increased filling pressure (grade 2 diastolic dysfunction).   Doppler parameters are consistent with elevated ventricular   end-diastolic filling pressure. - Aortic valve: There was moderate stenosis. There was mild   regurgitation. Mean gradient (S): 44 mm Hg. Peak gradient (S): 74   mm Hg. Valve area (VTI): 0.64 cm^2. Valve area (Vmax): 0.59 cm^2.   Valve area (Vmean): 0.65 cm^2. - Aortic root: The aortic root was normal in size. - Mitral valve: There was mild regurgitation. - Left atrium: The atrium was moderately dilated. - Right ventricle: Systolic function was normal. - Tricuspid valve: There was mild regurgitation. -  Pulmonary arteries: Systolic pressure was mildly increased. PA   peak pressure: 33 mm Hg (S). - Inferior vena cava: The vessel was normal in size. - Pericardium, extracardiac: There was no pericardial effusion.  Impressions:  - Severe aortic stenosis.  Assessment and Plan:  1.  Severe low gradient aortic stenosis and mild aortic regurgitation.  She has declined further evaluation having undergone assessment for TAVR.  Continue conservative management.  Follow-up imaging studies are not planned.  2.  Paroxysmal atrial fibrillation/flutter.  Heart rate is normal today and she reports no palpitations or breathlessness.  Continue with present medications.  Requesting lab work from Dr. Quillian Quince.  3.  History of syncope, no recurrences.  ILR in place with recent check in October.  No new events.  Current medicines were reviewed with the patient today.  Disposition: Follow-up in 6 months.  Signed, Satira Sark, MD, [facility] 06/13/2018 12:12 PM    [facility] [place], [place], [place] [address] Phone: [phone]; Fax: [phone]

## 2018-06-13 ENCOUNTER — Ambulatory Visit (INDEPENDENT_AMBULATORY_CARE_PROVIDER_SITE_OTHER): Payer: Medicare Other | Admitting: Cardiology

## 2018-06-13 ENCOUNTER — Encounter: Payer: Self-pay | Admitting: Cardiology

## 2018-06-13 ENCOUNTER — Encounter: Payer: Self-pay | Admitting: *Deleted

## 2018-06-13 VITALS — BP 124/68 | HR 55 | Ht 63.0 in | Wt 146.0 lb

## 2018-06-13 DIAGNOSIS — I350 Nonrheumatic aortic (valve) stenosis: Secondary | ICD-10-CM | POA: Diagnosis not present

## 2018-06-13 DIAGNOSIS — I4819 Other persistent atrial fibrillation: Secondary | ICD-10-CM | POA: Diagnosis not present

## 2018-06-13 DIAGNOSIS — Z87898 Personal history of other specified conditions: Secondary | ICD-10-CM | POA: Diagnosis not present

## 2018-06-13 NOTE — Patient Instructions (Addendum)

## 2018-06-14 ENCOUNTER — Telehealth: Payer: Self-pay | Admitting: *Deleted

## 2018-06-14 NOTE — Telephone Encounter (Signed)
-----   Message from Satira Sark, MD sent at 06/14/2018  8:18 AM EST ----- Results reviewed.  I reviewed her lab work from PCP.  LFTs and TSH were normal. A copy of this test should be forwarded to Caryl Bis, MD.

## 2018-06-14 NOTE — Telephone Encounter (Signed)
Patient informed. 

## 2018-06-27 ENCOUNTER — Ambulatory Visit (INDEPENDENT_AMBULATORY_CARE_PROVIDER_SITE_OTHER): Payer: Medicare Other

## 2018-06-27 DIAGNOSIS — R55 Syncope and collapse: Secondary | ICD-10-CM

## 2018-06-27 NOTE — Progress Notes (Signed)
Carelink Summary Report / Loop Recorder 

## 2018-07-06 DIAGNOSIS — H353110 Nonexudative age-related macular degeneration, right eye, stage unspecified: Secondary | ICD-10-CM | POA: Diagnosis not present

## 2018-07-11 DIAGNOSIS — H353221 Exudative age-related macular degeneration, left eye, with active choroidal neovascularization: Secondary | ICD-10-CM | POA: Diagnosis not present

## 2018-07-11 DIAGNOSIS — H35362 Drusen (degenerative) of macula, left eye: Secondary | ICD-10-CM | POA: Diagnosis not present

## 2018-07-11 DIAGNOSIS — G453 Amaurosis fugax: Secondary | ICD-10-CM | POA: Diagnosis not present

## 2018-07-11 DIAGNOSIS — H353132 Nonexudative age-related macular degeneration, bilateral, intermediate dry stage: Secondary | ICD-10-CM | POA: Diagnosis not present

## 2018-07-19 DIAGNOSIS — Z6824 Body mass index (BMI) 24.0-24.9, adult: Secondary | ICD-10-CM | POA: Diagnosis not present

## 2018-07-19 DIAGNOSIS — S81811A Laceration without foreign body, right lower leg, initial encounter: Secondary | ICD-10-CM | POA: Diagnosis not present

## 2018-07-29 ENCOUNTER — Ambulatory Visit (INDEPENDENT_AMBULATORY_CARE_PROVIDER_SITE_OTHER): Payer: Medicare Other

## 2018-07-29 DIAGNOSIS — R55 Syncope and collapse: Secondary | ICD-10-CM | POA: Diagnosis not present

## 2018-07-31 LAB — CUP PACEART REMOTE DEVICE CHECK
Implantable Pulse Generator Implant Date: 20170609
MDC IDC SESS DTM: 20191227031016

## 2018-08-01 NOTE — Progress Notes (Signed)
Carelink Summary Report / Loop Recorder 

## 2018-08-14 LAB — CUP PACEART REMOTE DEVICE CHECK
Date Time Interrogation Session: 20191124024137
MDC IDC PG IMPLANT DT: 20170609

## 2018-08-31 ENCOUNTER — Ambulatory Visit (INDEPENDENT_AMBULATORY_CARE_PROVIDER_SITE_OTHER): Payer: Medicare Other

## 2018-08-31 DIAGNOSIS — R55 Syncope and collapse: Secondary | ICD-10-CM

## 2018-09-01 NOTE — Progress Notes (Signed)
Carelink Summary Report / Loop Recorder 

## 2018-09-02 LAB — CUP PACEART REMOTE DEVICE CHECK
Date Time Interrogation Session: 20200129030543
Implantable Pulse Generator Implant Date: 20170609

## 2018-09-05 ENCOUNTER — Telehealth: Payer: Self-pay | Admitting: Cardiology

## 2018-09-05 DIAGNOSIS — H353221 Exudative age-related macular degeneration, left eye, with active choroidal neovascularization: Secondary | ICD-10-CM | POA: Diagnosis not present

## 2018-09-05 DIAGNOSIS — H43812 Vitreous degeneration, left eye: Secondary | ICD-10-CM | POA: Diagnosis not present

## 2018-09-05 DIAGNOSIS — H43811 Vitreous degeneration, right eye: Secondary | ICD-10-CM | POA: Diagnosis not present

## 2018-09-05 DIAGNOSIS — H353132 Nonexudative age-related macular degeneration, bilateral, intermediate dry stage: Secondary | ICD-10-CM | POA: Diagnosis not present

## 2018-09-05 MED ORDER — APIXABAN 5 MG PO TABS: 5.0000 mg | ORAL_TABLET | Freq: Two times a day (BID) | ORAL | 0 refills | Status: DC

## 2018-09-05 NOTE — Telephone Encounter (Signed)
Pt will come by office Wednesday to pick up

## 2018-09-05 NOTE — Telephone Encounter (Signed)
Patient calling the office for samples of medication:   1.  What medication and dosage are you requesting samples for? Eliquis   2.  Are you currently out of this medication? No     

## 2018-09-07 ENCOUNTER — Telehealth: Payer: Self-pay | Admitting: Cardiology

## 2018-09-07 MED ORDER — AMIODARONE HCL 200 MG PO TABS: 100.0000 mg | ORAL_TABLET | Freq: Every day | ORAL | 3 refills | Status: DC

## 2018-09-07 NOTE — Telephone Encounter (Signed)
amiodarone (PACERONE) 100 MG tablet   Patient walked in asking about changing dose to save money

## 2018-09-07 NOTE — Telephone Encounter (Signed)
Patient says she changed insurance carriers and the amiodarone 100 mg is now tier 4. Patient is asking that we send new prescription for amiodarone 200 mg which is on tier 2 and she would break them in half. Patient advised that a new prescription would be sent for a  90 day supply per her request  to [facility].

## 2018-10-03 ENCOUNTER — Ambulatory Visit (INDEPENDENT_AMBULATORY_CARE_PROVIDER_SITE_OTHER): Payer: Medicare Other | Admitting: *Deleted

## 2018-10-03 DIAGNOSIS — R5383 Other fatigue: Secondary | ICD-10-CM | POA: Diagnosis not present

## 2018-10-03 DIAGNOSIS — I10 Essential (primary) hypertension: Secondary | ICD-10-CM | POA: Diagnosis not present

## 2018-10-03 DIAGNOSIS — N183 Chronic kidney disease, stage 3 (moderate): Secondary | ICD-10-CM | POA: Diagnosis not present

## 2018-10-03 DIAGNOSIS — I480 Paroxysmal atrial fibrillation: Secondary | ICD-10-CM | POA: Diagnosis not present

## 2018-10-03 DIAGNOSIS — E782 Mixed hyperlipidemia: Secondary | ICD-10-CM | POA: Diagnosis not present

## 2018-10-03 DIAGNOSIS — E039 Hypothyroidism, unspecified: Secondary | ICD-10-CM | POA: Diagnosis not present

## 2018-10-03 DIAGNOSIS — R55 Syncope and collapse: Secondary | ICD-10-CM

## 2018-10-03 DIAGNOSIS — I4819 Other persistent atrial fibrillation: Secondary | ICD-10-CM

## 2018-10-03 DIAGNOSIS — E876 Hypokalemia: Secondary | ICD-10-CM | POA: Diagnosis not present

## 2018-10-03 LAB — CUP PACEART REMOTE DEVICE CHECK
Implantable Pulse Generator Implant Date: 20170609
MDC IDC SESS DTM: 20200302071236

## 2018-10-07 DIAGNOSIS — N183 Chronic kidney disease, stage 3 (moderate): Secondary | ICD-10-CM | POA: Diagnosis not present

## 2018-10-07 DIAGNOSIS — I4891 Unspecified atrial fibrillation: Secondary | ICD-10-CM | POA: Diagnosis not present

## 2018-10-07 DIAGNOSIS — I350 Nonrheumatic aortic (valve) stenosis: Secondary | ICD-10-CM | POA: Diagnosis not present

## 2018-10-07 DIAGNOSIS — I10 Essential (primary) hypertension: Secondary | ICD-10-CM | POA: Diagnosis not present

## 2018-10-07 DIAGNOSIS — I8391 Asymptomatic varicose veins of right lower extremity: Secondary | ICD-10-CM | POA: Diagnosis not present

## 2018-10-07 DIAGNOSIS — Z6825 Body mass index (BMI) 25.0-25.9, adult: Secondary | ICD-10-CM | POA: Diagnosis not present

## 2018-10-07 DIAGNOSIS — D692 Other nonthrombocytopenic purpura: Secondary | ICD-10-CM | POA: Diagnosis not present

## 2018-10-07 DIAGNOSIS — E782 Mixed hyperlipidemia: Secondary | ICD-10-CM | POA: Diagnosis not present

## 2018-10-10 NOTE — Progress Notes (Signed)
Carelink Summary Report / Loop Recorder 

## 2018-10-24 ENCOUNTER — Other Ambulatory Visit: Payer: Self-pay | Admitting: Cardiology

## 2018-10-24 DIAGNOSIS — H353221 Exudative age-related macular degeneration, left eye, with active choroidal neovascularization: Secondary | ICD-10-CM | POA: Diagnosis not present

## 2018-10-24 DIAGNOSIS — H43811 Vitreous degeneration, right eye: Secondary | ICD-10-CM | POA: Diagnosis not present

## 2018-10-24 DIAGNOSIS — H3562 Retinal hemorrhage, left eye: Secondary | ICD-10-CM | POA: Diagnosis not present

## 2018-10-24 DIAGNOSIS — H353132 Nonexudative age-related macular degeneration, bilateral, intermediate dry stage: Secondary | ICD-10-CM | POA: Diagnosis not present

## 2018-11-07 ENCOUNTER — Other Ambulatory Visit: Payer: Self-pay

## 2018-11-07 ENCOUNTER — Ambulatory Visit (INDEPENDENT_AMBULATORY_CARE_PROVIDER_SITE_OTHER): Payer: Medicare Other | Admitting: *Deleted

## 2018-11-07 DIAGNOSIS — R55 Syncope and collapse: Secondary | ICD-10-CM

## 2018-11-07 LAB — CUP PACEART REMOTE DEVICE CHECK
Date Time Interrogation Session: 20200404094254
Implantable Pulse Generator Implant Date: 20170609

## 2018-11-16 NOTE — Progress Notes (Signed)
Carelink Summary Report / Loop Recorder 

## 2018-12-05 DIAGNOSIS — H353132 Nonexudative age-related macular degeneration, bilateral, intermediate dry stage: Secondary | ICD-10-CM | POA: Diagnosis not present

## 2018-12-05 DIAGNOSIS — H353221 Exudative age-related macular degeneration, left eye, with active choroidal neovascularization: Secondary | ICD-10-CM | POA: Diagnosis not present

## 2018-12-08 ENCOUNTER — Ambulatory Visit (INDEPENDENT_AMBULATORY_CARE_PROVIDER_SITE_OTHER): Payer: Medicare Other | Admitting: *Deleted

## 2018-12-08 ENCOUNTER — Other Ambulatory Visit: Payer: Self-pay

## 2018-12-08 DIAGNOSIS — R55 Syncope and collapse: Secondary | ICD-10-CM

## 2018-12-08 LAB — CUP PACEART REMOTE DEVICE CHECK
Date Time Interrogation Session: 20200507100519
Implantable Pulse Generator Implant Date: 20170609

## 2018-12-13 ENCOUNTER — Encounter: Payer: Self-pay | Admitting: *Deleted

## 2018-12-13 NOTE — Progress Notes (Signed)
Carelink Summary Report / Loop Recorder 

## 2018-12-13 NOTE — Progress Notes (Signed)
Virtual Visit via Video Note   This visit type was conducted due to national recommendations for restrictions regarding the COVID-19 Pandemic (e.g. social distancing) in an effort to limit this patient's exposure and mitigate transmission in our community.  Due to her co-morbid illnesses, this patient is at least at moderate risk for complications without adequate follow up.  This format is felt to be most appropriate for this patient at this time.  All issues noted in this document were discussed and addressed.  A limited physical exam was performed with this format.  Please refer to the patient's chart for her consent to telehealth for [facility].   Date:  12/14/2018   ID:  Christine Pugh, DOB Oct 19, 1921, MRN [id number]  Patient Location: Home Provider Location: Office  PCP:  Caryl Bis, MD  Cardiologist:  Rozann Lesches, MD Electrophysiologist:  [name], MD  Evaluation Performed:  Follow-Up Visit  Chief Complaint:   Cardiac follow-up  History of Present Illness:    Christine Pugh is a 85 y.o. female was seen in November 2019.  We communicated via video conferencing today.  Remarkably, she continues to do reasonably well.  She has an Environmental consultant that checks on her 3 days a week at home.  That individual takes her out for shopping, she stays in the car and avoids crowds.  She does go to visit her son who lives nearby.  She uses a walker and cane in her house, denies any recent falls.  She is functional with basic ADLs.  Lives with her medications which are listed below and stable from a cardiac perspective.  No bleeding problems reported on Eliquis.  I did review her last lab work from Dr. Quillian Quince with whom she continues to follow regularly.  She has an implantable loop recorder in place, followed by Dr. Rayann Heman with history of syncope.  Device function was normal by recent check with no obvious arrhythmias.  She tells me that her grandmother died during the flu epidemic  back in 1917.  The patient does not have symptoms concerning for COVID-19 infection (fever, chills, cough, or new shortness of breath).    Past Medical History:  Diagnosis Date  . Aortic insufficiency    Mild  . Aortic stenosis    Moderate to severe  . 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  s/p Left brachial, radial, ulnar, axillary embolectomy 08/2016.  Marland Kitchen Atypical atrial flutter ([place])   . Embolic stroke involving right cerebellar artery ([place])   . Essential hypertension   . Hypothyroidism   . Ischemia of extremity   . Memory loss 11/26/2016  . Moderate to severe aortic stenosis   . PAF (paroxysmal atrial fibrillation) ([place])   . Paroxysmal atrial tachycardia ([place])   . Stroke ([place]) 08/2016  . Syncope    a. s/p ILR 01/2016.   Past Surgical History:  Procedure Laterality Date  . APPENDECTOMY    . BREAST LUMPECTOMY    . dental implant    . EP IMPLANTABLE DEVICE N/A 01/10/2016   Procedure: Loop Recorder Insertion;  Surgeon: [name], MD;  Location: [place] [facility];  Service: Cardiovascular;  Laterality: N/A;  . IR GENERIC HISTORICAL  08/23/2016   IR US GUIDE VASC ACCESS RIGHT 08/23/2016 Corrie Mckusick, DO [facility]  . IR GENERIC HISTORICAL  08/23/2016   IR PERCUTANEOUS ART THROMBECTOMY/INFUSION INTRACRANIAL INC DIAG ANGIO 08/23/2016 Corrie Mckusick, DO [facility]  . IR GENERIC HISTORICAL  08/23/2016  IR ANGIO INTRA EXTRACRAN SEL COM CAROTID INNOMINATE UNI L MOD SED 08/23/2016 Corrie Mckusick, DO [facility]  . RADIOLOGY WITH ANESTHESIA Bilateral 08/23/2016   Procedure: RADIOLOGY WITH ANESTHESIA;  Surgeon: Medication Radiologist, MD;  Location: [place];  Service: Radiology;  Laterality: Bilateral;  . THROMBECTOMY BRACHIAL ARTERY Left 08/23/2016   Procedure: Left Radial, Ulnar, Brachial and Axillary Embolectomy;  Surgeon: Elam Dutch, MD;  Location: [place];  Service: Vascular;  Laterality: Left;  . THYROIDECTOMY    . TONSILLECTOMY    . TOTAL ABDOMINAL  HYSTERECTOMY    . VITRECTOMY AND CATARACT       Current Meds  Medication Sig  . amiodarone (PACERONE) 200 MG tablet Take 0.5 tablets (100 mg total) by mouth daily.  Marland Kitchen apixaban (ELIQUIS) 5 MG TABS tablet Take 1 tablet (5 mg total) by mouth 2 (two) times daily.  . hydrocortisone (ANUSOL-HC) 25 MG suppository Place 1 suppository (25 mg total) rectally 2 (two) times daily.  Marland Kitchen levothyroxine (SYNTHROID, LEVOTHROID) 112 MCG tablet Take 1 tablet (112 mcg total) by mouth daily before breakfast.  . Lutein-Zeaxanthin 25-5 MG CAPS Take 1 capsule by mouth 2 (two) times daily.  . metoprolol succinate (TOPROL-XL) 25 MG 24 hr tablet TAKE 0.5 TABLETS (12.5 MG TOTAL) BY MOUTH 2 (TWO) TIMES DAILY.  . Multiple Vitamin (MULTIVITAMIN) tablet Take 1 tablet by mouth daily.    Marland Kitchen triamcinolone cream (KENALOG) 0.1 % Apply 1 application topically daily as needed (irritated skin).   Marland Kitchen triamterene-hydrochlorothiazide (DYAZIDE) 37.5-25 MG capsule Take 1 each (1 capsule total) by mouth daily.     Allergies:   Patient has no known allergies.   Social History   Tobacco Use  . Smoking status: Never Smoker  . Smokeless tobacco: Never Used  Substance Use Topics  . Alcohol use: No    Alcohol/week: 0.0 standard drinks  . Drug use: No     Family Hx: The patient's family history includes Coronary artery disease in an other family member; Stroke in her mother and sister.  ROS:   Please see the history of present illness.    All other systems reviewed and are negative.   Prior CV studies:   The following studies were reviewed today:  Echocardiogram 08/24/2016: Study Conclusions  - Left ventricle: The cavity size was normal. There was mild concentric hypertrophy. Systolic function was vigorous. The estimated ejection fraction was in the range of 65% to 70%. Wall motion was normal; there were no regional wall motion abnormalities. Features are consistent with a pseudonormal left ventricular filling  pattern, with concomitant abnormal relaxation and increased filling pressure (grade 2 diastolic dysfunction). Doppler parameters are consistent with elevated ventricular end-diastolic filling pressure. - Aortic valve: There was moderate stenosis. There was mild regurgitation. Mean gradient (S): 44 mm Hg. Peak gradient (S): 74 mm Hg. Valve area (VTI): 0.64 cm^2. Valve area (Vmax): 0.59 cm^2. Valve area (Vmean): 0.65 cm^2. - Aortic root: The aortic root was normal in size. - Mitral valve: There was mild regurgitation. - Left atrium: The atrium was moderately dilated. - Right ventricle: Systolic function was normal. - Tricuspid valve: There was mild regurgitation. - Pulmonary arteries: Systolic pressure was mildly increased. PA peak pressure: 33 mm Hg (S). - Inferior vena cava: The vessel was normal in size. - Pericardium, extracardiac: There was no pericardial effusion.  Impressions:  - Severe aortic stenosis.  Labs/Other Tests and Data Reviewed:    EKG:  An ECG dated 03/11/2018 was personally reviewed today and demonstrated:  Sinus arrhythmia with prolonged PR interval, left anterior fascicular block, nonspecific ST-T changes.  Recent Labs:  August 2019: BUN 17, creatinine 0.97, potassium 4.1, AST 20, ALT 12, hemoglobin 15.0, TSH 2.25  Wt Readings from Last 3 Encounters:  12/14/18 143 lb (64.9 kg)  06/13/18 146 lb (66.2 kg)  03/11/18 147 lb (66.7 kg)     Objective:    Vital Signs:  BP 121/74   Pulse 64   Ht 5\' 3"  (1.6 m)   Wt 143 lb (64.9 kg)   BMI 25.33 kg/m    General: Patient appears comfortable, seated in her home. HEENT: Conjunctiva and lids normal. Lungs: Patient spoke in full sentences without breathlessness.  No audible wheezing noted. Skin: Pale but normal appearing turgor. Neuropsychiatric: Gaze conjugate.  Speech pattern normal.  She moves all extremities, I observed her getting up from her chair.  Affect appropriate.  ASSESSMENT & PLAN:     1.  Severe, low gradient aortic stenosis and mild aortic regurgitation.  She has declined further evaluation having already been assessed for TAVR and prefers conservative management.  We are not pursuing follow-up imaging studies.  2.  Paroxysmal atrial fibrillation and flutter.  Heart rate 64 today would suggest that she is in normal rhythm.  She does not report any progressive palpitations otherwise continues on low-dose amiodarone and Eliquis.  3.  History of syncope without recurrence.  She has an implantable loop recorder in place with regular interrogation.  COVID-19 Education: The signs and symptoms of COVID-19 were discussed with the patient and how to seek care for testing (follow up with PCP or arrange E-visit).  The importance of social distancing was discussed today.  Time:   Today, I have spent 7 minutes with the patient with telehealth technology discussing the above problems.     Medication Adjustments/Labs and Tests Ordered: Current medicines are reviewed at length with the patient today.  Concerns regarding medicines are outlined above.   Tests Ordered: No orders of the defined types were placed in this encounter.   Medication Changes: No orders of the defined types were placed in this encounter.   Disposition:  Follow up 6 months in [place] office.  Signed, Rozann Lesches, MD  12/14/2018 10:37 AM    [place]

## 2018-12-14 ENCOUNTER — Telehealth (INDEPENDENT_AMBULATORY_CARE_PROVIDER_SITE_OTHER): Payer: Medicare Other | Admitting: Cardiology

## 2018-12-14 ENCOUNTER — Encounter: Payer: Self-pay | Admitting: Cardiology

## 2018-12-14 VITALS — BP 121/74 | HR 64 | Ht 63.0 in | Wt 143.0 lb

## 2018-12-14 DIAGNOSIS — Z7189 Other specified counseling: Secondary | ICD-10-CM

## 2018-12-14 DIAGNOSIS — I350 Nonrheumatic aortic (valve) stenosis: Secondary | ICD-10-CM

## 2018-12-14 DIAGNOSIS — I4819 Other persistent atrial fibrillation: Secondary | ICD-10-CM

## 2018-12-14 DIAGNOSIS — Z87898 Personal history of other specified conditions: Secondary | ICD-10-CM | POA: Diagnosis not present

## 2018-12-14 DIAGNOSIS — I484 Atypical atrial flutter: Secondary | ICD-10-CM | POA: Diagnosis not present

## 2018-12-14 DIAGNOSIS — I480 Paroxysmal atrial fibrillation: Secondary | ICD-10-CM

## 2018-12-14 NOTE — Patient Instructions (Addendum)

## 2019-01-03 ENCOUNTER — Telehealth: Payer: Self-pay | Admitting: Cardiology

## 2019-01-03 NOTE — Telephone Encounter (Signed)
Yes, she can have a glass of wine occasionally.

## 2019-01-03 NOTE — Telephone Encounter (Signed)
Pt made aware. She voiced understanding. She will pick up samples tomorrow.

## 2019-01-03 NOTE — Telephone Encounter (Signed)
Patient calling the office for samples of medication:   1.  What medication and dosage are you requesting samples for?   Eliquis 5 mg   2.  Are you currently out of this medication? No  Patient is wanting to know if she can have a glass of wine while taking Eliquis.    [phone])

## 2019-01-03 NOTE — Telephone Encounter (Signed)
I sent Cathey a message to see if we have any eliquis 5 mg samples, waiting for response. Will forward Dr. Domenic Polite message to see if pt may drink wine while on eliquis.

## 2019-01-10 ENCOUNTER — Ambulatory Visit (INDEPENDENT_AMBULATORY_CARE_PROVIDER_SITE_OTHER): Payer: Medicare Other | Admitting: *Deleted

## 2019-01-10 DIAGNOSIS — R55 Syncope and collapse: Secondary | ICD-10-CM

## 2019-01-10 DIAGNOSIS — I4819 Other persistent atrial fibrillation: Secondary | ICD-10-CM

## 2019-01-10 LAB — CUP PACEART REMOTE DEVICE CHECK
Date Time Interrogation Session: 20200609120739
Implantable Pulse Generator Implant Date: 20170609

## 2019-01-16 ENCOUNTER — Other Ambulatory Visit: Payer: Self-pay | Admitting: *Deleted

## 2019-01-16 MED ORDER — APIXABAN 5 MG PO TABS: 5.0000 mg | ORAL_TABLET | Freq: Two times a day (BID) | ORAL | 2 refills | Status: DC

## 2019-01-19 NOTE — Progress Notes (Signed)
Carelink Summary Report / Loop Recorder 

## 2019-01-23 ENCOUNTER — Telehealth: Payer: Self-pay | Admitting: Cardiology

## 2019-01-23 NOTE — Telephone Encounter (Signed)
Patient called and stated that her home monitor isn't working properly. She stated that they light is on and it appears to show that the reader is on her chest but it is not on her chest. I instructed pt to try and unplug monitor, count to ten, and plug monitor back in and see if the fixes the problem. Pt stated that she can not do that b/c the monitor is plugged in under her bed. She stated that her caregiver will be there at 10 and she will try that at that time. She stated that if it doesn't work she will call back.

## 2019-02-13 ENCOUNTER — Ambulatory Visit (INDEPENDENT_AMBULATORY_CARE_PROVIDER_SITE_OTHER): Payer: Medicare Other | Admitting: *Deleted

## 2019-02-13 DIAGNOSIS — I480 Paroxysmal atrial fibrillation: Secondary | ICD-10-CM | POA: Diagnosis not present

## 2019-02-13 LAB — CUP PACEART REMOTE DEVICE CHECK
Date Time Interrogation Session: 20200712123815
Implantable Pulse Generator Implant Date: 20170609

## 2019-02-24 NOTE — Progress Notes (Signed)
Carelink Summary Report / Loop Recorder 

## 2019-03-17 ENCOUNTER — Ambulatory Visit (INDEPENDENT_AMBULATORY_CARE_PROVIDER_SITE_OTHER): Payer: Medicare Other | Admitting: *Deleted

## 2019-03-17 DIAGNOSIS — I480 Paroxysmal atrial fibrillation: Secondary | ICD-10-CM | POA: Diagnosis not present

## 2019-03-17 LAB — CUP PACEART REMOTE DEVICE CHECK
Date Time Interrogation Session: 20200814114512
Implantable Pulse Generator Implant Date: 20170609

## 2019-03-27 DIAGNOSIS — H353221 Exudative age-related macular degeneration, left eye, with active choroidal neovascularization: Secondary | ICD-10-CM | POA: Diagnosis not present

## 2019-03-27 DIAGNOSIS — H3562 Retinal hemorrhage, left eye: Secondary | ICD-10-CM | POA: Diagnosis not present

## 2019-03-27 DIAGNOSIS — H43811 Vitreous degeneration, right eye: Secondary | ICD-10-CM | POA: Diagnosis not present

## 2019-03-27 DIAGNOSIS — H353132 Nonexudative age-related macular degeneration, bilateral, intermediate dry stage: Secondary | ICD-10-CM | POA: Diagnosis not present

## 2019-03-27 NOTE — Progress Notes (Signed)
Carelink Summary Report / Loop Recorder 

## 2019-04-19 ENCOUNTER — Ambulatory Visit (INDEPENDENT_AMBULATORY_CARE_PROVIDER_SITE_OTHER): Payer: Medicare Other | Admitting: *Deleted

## 2019-04-19 DIAGNOSIS — Z23 Encounter for immunization: Secondary | ICD-10-CM | POA: Diagnosis not present

## 2019-04-19 DIAGNOSIS — R55 Syncope and collapse: Secondary | ICD-10-CM | POA: Diagnosis not present

## 2019-04-19 LAB — CUP PACEART REMOTE DEVICE CHECK
Date Time Interrogation Session: 20200916134146
Implantable Pulse Generator Implant Date: 20170609

## 2019-04-25 NOTE — Progress Notes (Signed)
Carelink Summary Report / Loop Recorder 

## 2019-05-02 ENCOUNTER — Telehealth: Payer: Self-pay | Admitting: Cardiology

## 2019-05-02 MED ORDER — APIXABAN 5 MG PO TABS: 5.0000 mg | ORAL_TABLET | Freq: Two times a day (BID) | ORAL | 0 refills | Status: DC

## 2019-05-02 NOTE — Telephone Encounter (Signed)
Patient calling the office for samples of medication:   1.  What medication and dosage are you requesting samples for? Eliquius  2.  Are you currently out of this medication?

## 2019-05-02 NOTE — Telephone Encounter (Signed)
Pt caregiver will come by tomorrow around 11 am to pick up samples

## 2019-05-22 ENCOUNTER — Ambulatory Visit (INDEPENDENT_AMBULATORY_CARE_PROVIDER_SITE_OTHER): Payer: Medicare Other | Admitting: *Deleted

## 2019-05-22 DIAGNOSIS — I459 Conduction disorder, unspecified: Secondary | ICD-10-CM

## 2019-05-22 DIAGNOSIS — I480 Paroxysmal atrial fibrillation: Secondary | ICD-10-CM

## 2019-05-23 LAB — CUP PACEART REMOTE DEVICE CHECK
Date Time Interrogation Session: 20201019142327
Date Time Interrogation Session: 20201019214329
Implantable Pulse Generator Implant Date: 20170609
Implantable Pulse Generator Implant Date: 20170609

## 2019-05-24 ENCOUNTER — Telehealth: Payer: Self-pay

## 2019-05-24 DIAGNOSIS — I10 Essential (primary) hypertension: Secondary | ICD-10-CM | POA: Diagnosis not present

## 2019-05-24 DIAGNOSIS — E876 Hypokalemia: Secondary | ICD-10-CM | POA: Diagnosis not present

## 2019-05-24 DIAGNOSIS — E039 Hypothyroidism, unspecified: Secondary | ICD-10-CM | POA: Diagnosis not present

## 2019-05-24 DIAGNOSIS — E782 Mixed hyperlipidemia: Secondary | ICD-10-CM | POA: Diagnosis not present

## 2019-05-24 NOTE — Telephone Encounter (Signed)
The pt states she was thinking that her monitor updates automatically but her monitor wakes her up with the lights at night. I told her if it happens again she can unplug the monitor for 20 seconds then plug it back in and that should helps. I gave her the [employer] tech support in case it doesn't help. The pt thanked me for the call.

## 2019-05-29 DIAGNOSIS — I4891 Unspecified atrial fibrillation: Secondary | ICD-10-CM | POA: Diagnosis not present

## 2019-05-29 DIAGNOSIS — D692 Other nonthrombocytopenic purpura: Secondary | ICD-10-CM | POA: Diagnosis not present

## 2019-05-29 DIAGNOSIS — I8391 Asymptomatic varicose veins of right lower extremity: Secondary | ICD-10-CM | POA: Diagnosis not present

## 2019-05-29 DIAGNOSIS — I10 Essential (primary) hypertension: Secondary | ICD-10-CM | POA: Diagnosis not present

## 2019-05-29 DIAGNOSIS — Z6825 Body mass index (BMI) 25.0-25.9, adult: Secondary | ICD-10-CM | POA: Diagnosis not present

## 2019-05-29 DIAGNOSIS — E782 Mixed hyperlipidemia: Secondary | ICD-10-CM | POA: Diagnosis not present

## 2019-05-29 DIAGNOSIS — I350 Nonrheumatic aortic (valve) stenosis: Secondary | ICD-10-CM | POA: Diagnosis not present

## 2019-05-29 DIAGNOSIS — N189 Chronic kidney disease, unspecified: Secondary | ICD-10-CM | POA: Diagnosis not present

## 2019-06-12 NOTE — Progress Notes (Signed)
Carelink Summary Report / Loop Recorder 

## 2019-06-19 ENCOUNTER — Encounter: Payer: Self-pay | Admitting: Cardiology

## 2019-06-19 ENCOUNTER — Telehealth (INDEPENDENT_AMBULATORY_CARE_PROVIDER_SITE_OTHER): Payer: Medicare Other | Admitting: Cardiology

## 2019-06-19 ENCOUNTER — Encounter: Payer: Self-pay | Admitting: *Deleted

## 2019-06-19 VITALS — BP 136/77 | HR 55 | Ht 63.0 in | Wt 141.0 lb

## 2019-06-19 DIAGNOSIS — Z87898 Personal history of other specified conditions: Secondary | ICD-10-CM

## 2019-06-19 DIAGNOSIS — I484 Atypical atrial flutter: Secondary | ICD-10-CM

## 2019-06-19 DIAGNOSIS — I480 Paroxysmal atrial fibrillation: Secondary | ICD-10-CM

## 2019-06-19 DIAGNOSIS — I350 Nonrheumatic aortic (valve) stenosis: Secondary | ICD-10-CM

## 2019-06-19 NOTE — Patient Instructions (Addendum)

## 2019-06-19 NOTE — Progress Notes (Signed)
Virtual Visit via Video Note   This visit type was conducted due to national recommendations for restrictions regarding the COVID-19 Pandemic (e.g. social distancing) in an effort to limit this patient's exposure and mitigate transmission in our community.  Due to her co-morbid illnesses, this patient is at least at moderate risk for complications without adequate follow up.  This format is felt to be most appropriate for this patient at this time.  All issues noted in this document were discussed and addressed.  A limited physical exam was performed with this format.  Please refer to the patient's chart for her consent to telehealth for [facility].   Date:  06/19/2019   ID:  Christine Pugh, DOB September 21, 1921, MRN [id number]  Patient Location: Home Provider Location: Office  PCP:  Caryl Bis, MD  Cardiologist:  Rozann Lesches, MD Electrophysiologist:  None   Evaluation Performed:  Follow-Up Visit  Chief Complaint:   Cardiac follow-up  History of Present Illness:    Christine Pugh is a 85 y.o. female last assessed via telehealth encounter in May.  We communicated again today by video conferencing.  She appeared comfortable, seated in her home.  States that her typical assistant was not there today due to illness.  Ms. Kindig does not report any palpitations, no recent falls or syncope.  With assistance she goes to the [facility] to swim backstroke for about 15 minutes in the pool, 3 days a week.  We discussed follow-up lab work on amiodarone and Eliquis.  She states that she had this obtained with Dr. Quillian Quince in the interim, we are requesting the results.  She does not report any major bleeding problems.  She has an ILR in place followed by Dr. Rayann Heman, history of syncope.  Most recent interrogation showed no arrhythmias.  The patient does not have symptoms concerning for COVID-19 infection (fever, chills, cough, or new shortness of breath).   Past Medical History:  Diagnosis Date   . Aortic insufficiency    Mild  . Aortic stenosis    Moderate to severe  . 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  s/p Left brachial, radial, ulnar, axillary embolectomy 08/2016.  Marland Kitchen Atypical atrial flutter ([place])   . Embolic stroke involving right cerebellar artery ([place])   . Essential hypertension   . Hypothyroidism   . Ischemia of extremity   . Memory loss 11/26/2016  . Moderate to severe aortic stenosis   . PAF (paroxysmal atrial fibrillation) ([place])   . Paroxysmal atrial tachycardia ([place])   . Stroke ([place]) 08/2016  . Syncope    a. s/p ILR 01/2016.   Past Surgical History:  Procedure Laterality Date  . APPENDECTOMY    . BREAST LUMPECTOMY    . dental implant    . EP IMPLANTABLE DEVICE N/A 01/10/2016   Procedure: Loop Recorder Insertion;  Surgeon: [name], MD;  Location: [place] [facility];  Service: Cardiovascular;  Laterality: N/A;  . IR GENERIC HISTORICAL  08/23/2016   IR US GUIDE VASC ACCESS RIGHT 08/23/2016 Corrie Mckusick, DO [facility]  . IR GENERIC HISTORICAL  08/23/2016   IR PERCUTANEOUS ART THROMBECTOMY/INFUSION INTRACRANIAL INC DIAG ANGIO 08/23/2016 Corrie Mckusick, DO [facility]  . IR GENERIC HISTORICAL  08/23/2016   IR ANGIO INTRA EXTRACRAN SEL COM CAROTID INNOMINATE UNI L MOD SED 08/23/2016 Corrie Mckusick, DO [facility]  . RADIOLOGY WITH ANESTHESIA Bilateral 08/23/2016   Procedure: RADIOLOGY WITH ANESTHESIA;  Surgeon: Medication Radiologist, MD;  Location: [place]  OR;  Service: Radiology;  Laterality: Bilateral;  . THROMBECTOMY BRACHIAL ARTERY Left 08/23/2016   Procedure: Left Radial, Ulnar, Brachial and Axillary Embolectomy;  Surgeon: Elam Dutch, MD;  Location: [place];  Service: Vascular;  Laterality: Left;  . THYROIDECTOMY    . TONSILLECTOMY    . TOTAL ABDOMINAL HYSTERECTOMY    . VITRECTOMY AND CATARACT       Current Meds  Medication Sig  . amiodarone (PACERONE) 200 MG tablet Take 0.5 tablets (100 mg total) by mouth daily.  Marland Kitchen apixaban  (ELIQUIS) 5 MG TABS tablet Take 1 tablet (5 mg total) by mouth 2 (two) times daily.  . hydrocortisone (ANUSOL-HC) 25 MG suppository Place 1 suppository (25 mg total) rectally 2 (two) times daily.  Marland Kitchen levothyroxine (SYNTHROID, LEVOTHROID) 112 MCG tablet Take 1 tablet (112 mcg total) by mouth daily before breakfast.  . Lutein-Zeaxanthin 25-5 MG CAPS Take 1 capsule by mouth 2 (two) times daily.  . metoprolol succinate (TOPROL-XL) 25 MG 24 hr tablet TAKE 0.5 TABLETS (12.5 MG TOTAL) BY MOUTH 2 (TWO) TIMES DAILY.  . Multiple Vitamin (MULTIVITAMIN) tablet Take 1 tablet by mouth daily.    Marland Kitchen triamcinolone cream (KENALOG) 0.1 % Apply 1 application topically daily as needed (irritated skin).   Marland Kitchen triamterene-hydrochlorothiazide (DYAZIDE) 37.5-25 MG capsule Take 1 each (1 capsule total) by mouth daily.     Allergies:   Patient has no known allergies.   Social History   Tobacco Use  . Smoking status: Never Smoker  . Smokeless tobacco: Never Used  Substance Use Topics  . Alcohol use: No    Alcohol/week: 0.0 standard drinks  . Drug use: No     Family Hx: The patient's family history includes Coronary artery disease in an other family member; Stroke in her mother and sister.  ROS:   Please see the history of present illness. All other systems reviewed and are negative.   Prior CV studies:   The following studies were reviewed today:  Echocardiogram 08/24/2016: Study Conclusions  - Left ventricle: The cavity size was normal. There was mild concentric hypertrophy. Systolic function was vigorous. The estimated ejection fraction was in the range of 65% to 70%. Wall motion was normal; there were no regional wall motion abnormalities. Features are consistent with a pseudonormal left ventricular filling pattern, with concomitant abnormal relaxation and increased filling pressure (grade 2 diastolic dysfunction). Doppler parameters are consistent with elevated ventricular  end-diastolic filling pressure. - Aortic valve: There was moderate stenosis. There was mild regurgitation. Mean gradient (S): 44 mm Hg. Peak gradient (S): 74 mm Hg. Valve area (VTI): 0.64 cm^2. Valve area (Vmax): 0.59 cm^2. Valve area (Vmean): 0.65 cm^2. - Aortic root: The aortic root was normal in size. - Mitral valve: There was mild regurgitation. - Left atrium: The atrium was moderately dilated. - Right ventricle: Systolic function was normal. - Tricuspid valve: There was mild regurgitation. - Pulmonary arteries: Systolic pressure was mildly increased. PA peak pressure: 33 mm Hg (S). - Inferior vena cava: The vessel was normal in size. - Pericardium, extracardiac: There was no pericardial effusion.  Impressions:  - Severe aortic stenosis.  Labs/Other Tests and Data Reviewed:    EKG:  An ECG dated 03/11/2018 was personally reviewed today and demonstrated:  Sinus arrhythmia with prolonged PR interval, left anterior fascicular block, nonspecific ST-T changes.  Recent Labs:  August 2019: BUN 17, creatinine 0.97, potassium 4.1, AST 20, ALT 12, hemoglobin 15.0, TSH 2.25  Wt Readings from Last 3 Encounters:  06/19/19  141 lb (64 kg)  12/14/18 143 lb (64.9 kg)  06/13/18 146 lb (66.2 kg)     Objective:    Vital Signs:  BP 136/77   Pulse (!) 55   Ht 5\' 3"  (1.6 m)   Wt 141 lb (64 kg)   BMI 24.98 kg/m    General: Elderly woman, appears comfortable seated. HEENT: Conjunctiva and lids normal. Skin: No obvious rash or erythema to face and arms. Lungs: Patient spoke in full sentences, not obviously short of breath.  No audible wheezing or coughing. Musculoskeletal: Kyphosis noted. Neuropsychiatric: Gaze conjugate.  Patient observed to move upper extremities while she was seated.  Speech pattern normal.  Affect appropriate.  ASSESSMENT & PLAN:    1.  Severe, low gradient aortic stenosis and mild aortic regurgitation.  This is being followed conservatively.  Patient was  evaluated for TAVR previously and declined.  We are not ordering follow-up imaging studies at this point.  2.  Paroxysmal atrial fibrillation/flutter.  No reported palpitations.  Heart rate in the 50s today.  She continues on amiodarone and Eliquis, interval lab work being requested.  She does not report any major bleeding problems or recent falls.  3.  History of syncope, no recurrence.  ILR in place with follow-up by Dr. Rayann Heman.  COVID-19 Education: The signs and symptoms of COVID-19 were discussed with the patient and how to seek care for testing (follow up with PCP or arrange E-visit).  The importance of social distancing was discussed today.  Time:   Today, I have spent 7 minutes with the patient with telehealth technology discussing the above problems.     Medication Adjustments/Labs and Tests Ordered: Current medicines are reviewed at length with the patient today.  Concerns regarding medicines are outlined above.   Tests Ordered: No orders of the defined types were placed in this encounter.   Medication Changes: No orders of the defined types were placed in this encounter.   Follow Up:  In Person 6 months in the [place] office.  Signed, Rozann Lesches, MD  06/19/2019 11:17 AM    [place]

## 2019-06-25 LAB — CUP PACEART REMOTE DEVICE CHECK
Date Time Interrogation Session: 20201121101217
Implantable Pulse Generator Implant Date: 20170609

## 2019-06-26 ENCOUNTER — Ambulatory Visit (INDEPENDENT_AMBULATORY_CARE_PROVIDER_SITE_OTHER): Payer: Medicare Other | Admitting: *Deleted

## 2019-06-26 DIAGNOSIS — I63411 Cerebral infarction due to embolism of right middle cerebral artery: Secondary | ICD-10-CM | POA: Diagnosis not present

## 2019-06-28 ENCOUNTER — Other Ambulatory Visit: Payer: Self-pay

## 2019-07-03 ENCOUNTER — Other Ambulatory Visit: Payer: Self-pay | Admitting: Cardiology

## 2019-07-03 DIAGNOSIS — I10 Essential (primary) hypertension: Secondary | ICD-10-CM | POA: Diagnosis not present

## 2019-07-03 DIAGNOSIS — E782 Mixed hyperlipidemia: Secondary | ICD-10-CM | POA: Diagnosis not present

## 2019-07-06 ENCOUNTER — Telehealth: Payer: Self-pay | Admitting: Internal Medicine

## 2019-07-06 NOTE — Telephone Encounter (Signed)
LINQ alert for RRT. Spoke with patient. She will call back if she would like device explanted. Discontinued [employer] monitoring. Return kit ordered.   Chanetta Marshall, NP 07/06/2019 11:41 AM

## 2019-07-25 ENCOUNTER — Telehealth: Payer: Self-pay | Admitting: Internal Medicine

## 2019-07-25 NOTE — Telephone Encounter (Signed)
New Message:  Patient got a MyChart reminder message about her next home remote pacemaker check on 12/24.   She was told to return the device that sends the transmission because the battery in her internal monitor is dead.  She is still waiting for a kit to mail the device back to the manufacturer.  She has not received the return kit yet and does not know what to do.

## 2019-07-25 NOTE — Telephone Encounter (Signed)
I let the pt know I cancelled all the upcoming home remote appointment. I ordered her a new return kit as well. Pt states the Loop does not bother her and she would like to leave it in at the moment.

## 2019-08-03 DIAGNOSIS — I10 Essential (primary) hypertension: Secondary | ICD-10-CM | POA: Diagnosis not present

## 2019-08-03 DIAGNOSIS — E782 Mixed hyperlipidemia: Secondary | ICD-10-CM | POA: Diagnosis not present

## 2019-08-23 DIAGNOSIS — H353132 Nonexudative age-related macular degeneration, bilateral, intermediate dry stage: Secondary | ICD-10-CM | POA: Diagnosis not present

## 2019-08-23 DIAGNOSIS — H353222 Exudative age-related macular degeneration, left eye, with inactive choroidal neovascularization: Secondary | ICD-10-CM | POA: Diagnosis not present

## 2019-08-23 DIAGNOSIS — H43811 Vitreous degeneration, right eye: Secondary | ICD-10-CM | POA: Diagnosis not present

## 2019-08-23 DIAGNOSIS — H3562 Retinal hemorrhage, left eye: Secondary | ICD-10-CM | POA: Diagnosis not present

## 2019-08-24 ENCOUNTER — Telehealth: Payer: Self-pay

## 2019-08-24 DIAGNOSIS — Z23 Encounter for immunization: Secondary | ICD-10-CM | POA: Diagnosis not present

## 2019-08-24 MED ORDER — APIXABAN 5 MG PO TABS: 5.0000 mg | ORAL_TABLET | Freq: Two times a day (BID) | ORAL | 0 refills | Status: DC

## 2019-08-24 NOTE — Telephone Encounter (Signed)
Advised that eliquis 5 mg samples are available for pick up. Says that she needed a few samples to last for a few weeks until she meets her deductible, then insurance will pay more on copay.

## 2019-08-24 NOTE — Telephone Encounter (Signed)
Patient calling the office for samples of medication:   1.What medicationand dosage are you requesting samples for?apixaban (ELIQUIS) 5 MG TABS tablet    2.Are you currentlyout of this medication? 

## 2019-09-01 DIAGNOSIS — E7849 Other hyperlipidemia: Secondary | ICD-10-CM | POA: Diagnosis not present

## 2019-09-01 DIAGNOSIS — I10 Essential (primary) hypertension: Secondary | ICD-10-CM | POA: Diagnosis not present

## 2019-09-20 DIAGNOSIS — Z23 Encounter for immunization: Secondary | ICD-10-CM | POA: Diagnosis not present

## 2019-10-04 ENCOUNTER — Telehealth: Payer: Self-pay | Admitting: Cardiology

## 2019-10-04 NOTE — Telephone Encounter (Signed)
Has medical question would like to know what  "heart valve tighter" means.

## 2019-10-04 NOTE — Telephone Encounter (Signed)
Pt was asking about her sister who lives in [place] had been recommended by cardiologist there to have TAVR and has refused and sister was told that she had a "tight valve" and wanted to know what this meant for her sister - pt aware that without having her sister medical records and test could not speculate on her condition although that may have meant stenosis but could not confirm - pt voiced understanding

## 2019-11-27 DIAGNOSIS — N183 Chronic kidney disease, stage 3 unspecified: Secondary | ICD-10-CM | POA: Diagnosis not present

## 2019-11-27 DIAGNOSIS — I10 Essential (primary) hypertension: Secondary | ICD-10-CM | POA: Diagnosis not present

## 2019-11-27 DIAGNOSIS — E782 Mixed hyperlipidemia: Secondary | ICD-10-CM | POA: Diagnosis not present

## 2019-11-27 DIAGNOSIS — I482 Chronic atrial fibrillation, unspecified: Secondary | ICD-10-CM | POA: Diagnosis not present

## 2019-11-27 DIAGNOSIS — E039 Hypothyroidism, unspecified: Secondary | ICD-10-CM | POA: Diagnosis not present

## 2019-12-01 DIAGNOSIS — I8391 Asymptomatic varicose veins of right lower extremity: Secondary | ICD-10-CM | POA: Diagnosis not present

## 2019-12-01 DIAGNOSIS — I350 Nonrheumatic aortic (valve) stenosis: Secondary | ICD-10-CM | POA: Diagnosis not present

## 2019-12-01 DIAGNOSIS — Z0001 Encounter for general adult medical examination with abnormal findings: Secondary | ICD-10-CM | POA: Diagnosis not present

## 2019-12-01 DIAGNOSIS — I10 Essential (primary) hypertension: Secondary | ICD-10-CM | POA: Diagnosis not present

## 2019-12-01 DIAGNOSIS — D692 Other nonthrombocytopenic purpura: Secondary | ICD-10-CM | POA: Diagnosis not present

## 2019-12-01 DIAGNOSIS — E782 Mixed hyperlipidemia: Secondary | ICD-10-CM | POA: Diagnosis not present

## 2019-12-01 DIAGNOSIS — N1831 Chronic kidney disease, stage 3a: Secondary | ICD-10-CM | POA: Diagnosis not present

## 2019-12-14 ENCOUNTER — Other Ambulatory Visit: Payer: Self-pay | Admitting: Cardiology

## 2019-12-29 ENCOUNTER — Other Ambulatory Visit: Payer: Self-pay | Admitting: Cardiology

## 2020-01-05 ENCOUNTER — Encounter: Payer: Self-pay | Admitting: Cardiology

## 2020-01-05 ENCOUNTER — Other Ambulatory Visit: Payer: Self-pay

## 2020-01-05 ENCOUNTER — Ambulatory Visit (INDEPENDENT_AMBULATORY_CARE_PROVIDER_SITE_OTHER): Payer: Medicare Other | Admitting: Cardiology

## 2020-01-05 VITALS — BP 156/90 | HR 62 | Ht 63.0 in | Wt 143.0 lb

## 2020-01-05 DIAGNOSIS — I350 Nonrheumatic aortic (valve) stenosis: Secondary | ICD-10-CM

## 2020-01-05 DIAGNOSIS — I480 Paroxysmal atrial fibrillation: Secondary | ICD-10-CM | POA: Diagnosis not present

## 2020-01-05 NOTE — Progress Notes (Signed)
Cardiology Office Note  Date: 01/05/2020   ID: Christine Pugh, DOB 1921/10/15, MRN [id number]  PCP:  Caryl Bis, MD  Cardiologist:  Rozann Lesches, MD Electrophysiologist:  None   Chief Complaint  Patient presents with  . Cardiac follow-up    History of Present Illness: Christine Pugh is a 86 y.o. female last assessed via telehealth encounter in November 2020.  She is here today with her son for a follow-up visit.  She uses a rolling walker, still tries to get to the [employer] 3 days a week to swim backstroke, usually does 4 laps.  She does have some unsteadiness on her feet, reports a recent fall without injury.  She has an ILR in place with follow-up by Dr. Rayann Heman.  This has not been interrogated recently.  She does not report any frank syncope.  I reviewed her recent lab work from April as outlined below.  She does not describe any bleeding problems on Eliquis.  I personally reviewed her ECG today which shows sinus rhythm with prolonged PR interval, nonspecific anterolateral T wave abnormalities.  Past Medical History:  Diagnosis Date  . Aortic insufficiency    Mild  . Aortic stenosis    Moderate to severe  . 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  s/p Left brachial, radial, ulnar, axillary embolectomy 08/2016.  Marland Kitchen Atypical atrial flutter ([place])   . Embolic stroke involving right cerebellar artery ([place])   . Essential hypertension   . Hypothyroidism   . Ischemia of extremity   . Memory loss 11/26/2016  . Moderate to severe aortic stenosis   . PAF (paroxysmal atrial fibrillation) ([place])   . Paroxysmal atrial tachycardia ([place])   . Stroke ([place]) 08/2016  . Syncope    a. s/p ILR 01/2016.    Past Surgical History:  Procedure Laterality Date  . APPENDECTOMY    . BREAST LUMPECTOMY    . dental implant    . EP IMPLANTABLE DEVICE N/A 01/10/2016   Procedure: Loop Recorder Insertion;  Surgeon: [name], MD;  Location: [facility];  Service:  Cardiovascular;  Laterality: N/A;  . IR GENERIC HISTORICAL  08/23/2016   IR US GUIDE VASC ACCESS RIGHT 08/23/2016 Corrie Mckusick, DO [facility]  . IR GENERIC HISTORICAL  08/23/2016   IR PERCUTANEOUS ART THROMBECTOMY/INFUSION INTRACRANIAL INC DIAG ANGIO 08/23/2016 Corrie Mckusick, DO [facility]  . IR GENERIC HISTORICAL  08/23/2016   IR ANGIO INTRA EXTRACRAN SEL COM CAROTID INNOMINATE UNI L MOD SED 08/23/2016 Corrie Mckusick, DO [facility]  . RADIOLOGY WITH ANESTHESIA Bilateral 08/23/2016   Procedure: RADIOLOGY WITH ANESTHESIA;  Surgeon: Medication Radiologist, MD;  Location: [facility];  Service: Radiology;  Laterality: Bilateral;  . THROMBECTOMY BRACHIAL ARTERY Left 08/23/2016   Procedure: Left Radial, Ulnar, Brachial and Axillary Embolectomy;  Surgeon: Elam Dutch, MD;  Location: [facility];  Service: Vascular;  Laterality: Left;  . THYROIDECTOMY    . TONSILLECTOMY    . TOTAL ABDOMINAL HYSTERECTOMY    . VITRECTOMY AND CATARACT      Current Outpatient Medications  Medication Sig Dispense Refill  . amiodarone (PACERONE) 200 MG tablet TAKE 1/2 TABLET BY MOUTH DAILY 45 tablet 1  . ELIQUIS 5 MG TABS tablet TAKE 1 TABLET BY MOUTH TWICE A DAY 180 tablet 2  . hydrocortisone (ANUSOL-HC) 25 MG suppository Place 1 suppository (25 mg total) rectally 2 (two) times daily. 6 suppository 0  . levothyroxine (SYNTHROID, LEVOTHROID) 112 MCG tablet Take 1  tablet (112 mcg total) by mouth daily before breakfast. 30 tablet 0  . Lutein-Zeaxanthin 25-5 MG CAPS Take 1 capsule by mouth 2 (two) times daily.    . metoprolol succinate (TOPROL-XL) 25 MG 24 hr tablet TAKE 0.5 TABLETS (12.5 MG TOTAL) BY MOUTH 2 (TWO) TIMES DAILY. 180 tablet 1  . Multiple Vitamin (MULTIVITAMIN) tablet Take 1 tablet by mouth daily.      Marland Kitchen triamcinolone cream (KENALOG) 0.1 % Apply 1 application topically daily as needed (irritated skin).     Marland Kitchen triamterene-hydrochlorothiazide (DYAZIDE) 37.5-25 MG capsule Take 1 each (1 capsule total) by mouth daily. 30  capsule 0   No current facility-administered medications for this visit.   Allergies:  Patient has no known allergies.   ROS:   Hearing loss.  Physical Exam: VS:  BP (!) 156/90   Pulse 62   Ht 5\' 3"  (1.6 m)   Wt 143 lb (64.9 kg)   SpO2 96%   BMI 25.33 kg/m , BMI Body mass index is 25.33 kg/m.  Wt Readings from Last 3 Encounters:  01/05/20 143 lb (64.9 kg)  06/19/19 141 lb (64 kg)  12/14/18 143 lb (64.9 kg)    General: Elderly woman, using a rolling walker. HEENT: Conjunctiva and lids normal, wearing a mask. Neck: Supple, no elevated JVP or carotid bruits, no thyromegaly. Lungs: Clear to auscultation, nonlabored breathing at rest. Cardiac: Regular rate and rhythm, no S3, 3/6 systolic murmur. Extremities: Venous stasis, distal pulses 2+.  ECG:  An ECG dated 03/11/2018 was personally reviewed today and demonstrated:  Sinus arrhythmia with prolonged PR interval, left anterior fascicular block, nonspecific ST-T changes.  Recent Labwork:  April 2021: BUN 18, creatinine 0.98, potassium 4.1, AST 23, ALT 13, cholesterol 251, triglycerides 104, HDL 90, LDL 143, TSH 3.78  Other Studies Reviewed Today:  Echocardiogram 08/24/2016: Study Conclusions  - Left ventricle: The cavity size was normal. There was mild concentric hypertrophy. Systolic function was vigorous. The estimated ejection fraction was in the range of 65% to 70%. Wall motion was normal; there were no regional wall motion abnormalities. Features are consistent with a pseudonormal left ventricular filling pattern, with concomitant abnormal relaxation and increased filling pressure (grade 2 diastolic dysfunction). Doppler parameters are consistent with elevated ventricular end-diastolic filling pressure. - Aortic valve: There was moderate stenosis. There was mild regurgitation. Mean gradient (S): 44 mm Hg. Peak gradient (S): 74 mm Hg. Valve area (VTI): 0.64 cm^2. Valve area (Vmax): 0.59  cm^2. Valve area (Vmean): 0.65 cm^2. - Aortic root: The aortic root was normal in size. - Mitral valve: There was mild regurgitation. - Left atrium: The atrium was moderately dilated. - Right ventricle: Systolic function was normal. - Tricuspid valve: There was mild regurgitation. - Pulmonary arteries: Systolic pressure was mildly increased. PA peak pressure: 33 mm Hg (S). - Inferior vena cava: The vessel was normal in size. - Pericardium, extracardiac: There was no pericardial effusion.  Impressions:  - Severe aortic stenosis.  Assessment and Plan:  1.  Paroxysmal atrial fibrillation/flutter.  She is in sinus rhythm today and remains on the amiodarone and Eliquis.  No recent palpitations.  I reviewed her lab work from April.  She does not report any spontaneous bleeding problems.  2.  Severe, low gradient aortic stenosis with mild aortic regurgitation.  She has declined TAVR.  Following conservatively at this point.  Medication Adjustments/Labs and Tests Ordered: Current medicines are reviewed at length with the patient today.  Concerns regarding medicines are outlined above.  Tests Ordered: Orders Placed This Encounter  Procedures  . EKG 12-Lead    Medication Changes: No orders of the defined types were placed in this encounter.   Disposition:  Follow up 6 months in the [place] office.  Signed, Satira Sark, MD, [facility] 01/05/2020 4:10 PM    [facility] at [place], [place], [place] [address] Phone: [phone]; Fax: [phone]

## 2020-01-05 NOTE — Patient Instructions (Addendum)
Medication Instructions:    Your physician recommends that you continue on your current medications as directed. Please refer to the Current Medication list given to you today.  Labwork:  NONE  Testing/Procedures:  NONE  Follow-Up:  Your physician recommends that you schedule a follow-up appointment in: 6 months (office)  Any Other Special Instructions Will Be Listed Below (If Applicable).  If you need a refill on your cardiac medications before your next appointment, please call your pharmacy. 

## 2020-02-15 DIAGNOSIS — Z6824 Body mass index (BMI) 24.0-24.9, adult: Secondary | ICD-10-CM | POA: Diagnosis not present

## 2020-02-15 DIAGNOSIS — M6289 Other specified disorders of muscle: Secondary | ICD-10-CM | POA: Diagnosis not present

## 2020-02-16 DIAGNOSIS — R2232 Localized swelling, mass and lump, left upper limb: Secondary | ICD-10-CM | POA: Diagnosis not present

## 2020-02-16 DIAGNOSIS — I721 Aneurysm of artery of upper extremity: Secondary | ICD-10-CM | POA: Diagnosis not present

## 2020-02-16 DIAGNOSIS — M19012 Primary osteoarthritis, left shoulder: Secondary | ICD-10-CM | POA: Diagnosis not present

## 2020-02-16 DIAGNOSIS — Z86718 Personal history of other venous thrombosis and embolism: Secondary | ICD-10-CM | POA: Diagnosis not present

## 2020-02-16 DIAGNOSIS — Z0000 Encounter for general adult medical examination without abnormal findings: Secondary | ICD-10-CM | POA: Diagnosis not present

## 2020-02-17 ENCOUNTER — Other Ambulatory Visit: Payer: Self-pay

## 2020-02-17 ENCOUNTER — Inpatient Hospital Stay (HOSPITAL_COMMUNITY)
Admission: EM | Admit: 2020-02-17 | Discharge: 2020-02-27 | DRG: 254 | Disposition: A | Discharge status: Skilled Nursing Facility | Payer: Medicare Other | Attending: Vascular Surgery | Admitting: Vascular Surgery

## 2020-02-17 ENCOUNTER — Emergency Department (HOSPITAL_COMMUNITY): Payer: Medicare Other

## 2020-02-17 ENCOUNTER — Encounter (HOSPITAL_COMMUNITY): Payer: Self-pay | Admitting: Emergency Medicine

## 2020-02-17 DIAGNOSIS — I119 Hypertensive heart disease without heart failure: Secondary | ICD-10-CM | POA: Diagnosis present

## 2020-02-17 DIAGNOSIS — Z90710 Acquired absence of both cervix and uterus: Secondary | ICD-10-CM

## 2020-02-17 DIAGNOSIS — R32 Unspecified urinary incontinence: Secondary | ICD-10-CM | POA: Diagnosis not present

## 2020-02-17 DIAGNOSIS — Z743 Need for continuous supervision: Secondary | ICD-10-CM | POA: Diagnosis not present

## 2020-02-17 DIAGNOSIS — Z8673 Personal history of transient ischemic attack (TIA), and cerebral infarction without residual deficits: Secondary | ICD-10-CM

## 2020-02-17 DIAGNOSIS — Z79899 Other long term (current) drug therapy: Secondary | ICD-10-CM | POA: Diagnosis not present

## 2020-02-17 DIAGNOSIS — E890 Postprocedural hypothyroidism: Secondary | ICD-10-CM | POA: Diagnosis present

## 2020-02-17 DIAGNOSIS — I10 Essential (primary) hypertension: Secondary | ICD-10-CM | POA: Diagnosis not present

## 2020-02-17 DIAGNOSIS — Z823 Family history of stroke: Secondary | ICD-10-CM

## 2020-02-17 DIAGNOSIS — I350 Nonrheumatic aortic (valve) stenosis: Secondary | ICD-10-CM | POA: Diagnosis not present

## 2020-02-17 DIAGNOSIS — Z8249 Family history of ischemic heart disease and other diseases of the circulatory system: Secondary | ICD-10-CM | POA: Diagnosis not present

## 2020-02-17 DIAGNOSIS — Z20822 Contact with and (suspected) exposure to covid-19: Secondary | ICD-10-CM | POA: Diagnosis present

## 2020-02-17 DIAGNOSIS — I480 Paroxysmal atrial fibrillation: Secondary | ICD-10-CM | POA: Diagnosis not present

## 2020-02-17 DIAGNOSIS — R5381 Other malaise: Secondary | ICD-10-CM | POA: Diagnosis not present

## 2020-02-17 DIAGNOSIS — Z79890 Hormone replacement therapy (postmenopausal): Secondary | ICD-10-CM

## 2020-02-17 DIAGNOSIS — Z48812 Encounter for surgical aftercare following surgery on the circulatory system: Secondary | ICD-10-CM | POA: Diagnosis not present

## 2020-02-17 DIAGNOSIS — I739 Peripheral vascular disease, unspecified: Secondary | ICD-10-CM | POA: Diagnosis present

## 2020-02-17 DIAGNOSIS — K649 Unspecified hemorrhoids: Secondary | ICD-10-CM | POA: Diagnosis not present

## 2020-02-17 DIAGNOSIS — Z7901 Long term (current) use of anticoagulants: Secondary | ICD-10-CM | POA: Diagnosis not present

## 2020-02-17 DIAGNOSIS — Z01818 Encounter for other preprocedural examination: Secondary | ICD-10-CM | POA: Diagnosis not present

## 2020-02-17 DIAGNOSIS — I721 Aneurysm of artery of upper extremity: Principal | ICD-10-CM

## 2020-02-17 DIAGNOSIS — Z01810 Encounter for preprocedural cardiovascular examination: Secondary | ICD-10-CM

## 2020-02-17 DIAGNOSIS — R279 Unspecified lack of coordination: Secondary | ICD-10-CM | POA: Diagnosis not present

## 2020-02-17 DIAGNOSIS — J9811 Atelectasis: Secondary | ICD-10-CM | POA: Diagnosis not present

## 2020-02-17 DIAGNOSIS — I517 Cardiomegaly: Secondary | ICD-10-CM | POA: Diagnosis not present

## 2020-02-17 DIAGNOSIS — I352 Nonrheumatic aortic (valve) stenosis with insufficiency: Secondary | ICD-10-CM | POA: Diagnosis present

## 2020-02-17 DIAGNOSIS — R0902 Hypoxemia: Secondary | ICD-10-CM | POA: Diagnosis not present

## 2020-02-17 DIAGNOSIS — E039 Hypothyroidism, unspecified: Secondary | ICD-10-CM | POA: Diagnosis not present

## 2020-02-17 LAB — BASIC METABOLIC PANEL
Anion gap: 13 (ref 5–15)
BUN: 14 mg/dL (ref 8–23)
CO2: 25 mmol/L (ref 22–32)
Calcium: 9.1 mg/dL (ref 8.9–10.3)
Chloride: 99 mmol/L (ref 98–111)
Creatinine, Ser: 0.94 mg/dL (ref 0.44–1.00)
GFR calc Af Amer: 59 mL/min — ABNORMAL LOW (ref >60)
GFR calc non Af Amer: 51 mL/min — ABNORMAL LOW (ref >60)
Glucose, Bld: 86 mg/dL (ref 70–99)
Potassium: 4.5 mmol/L (ref 3.5–5.1)
Sodium: 137 mmol/L (ref 135–145)

## 2020-02-17 LAB — CBC
HCT: 43.4 % (ref 36.0–46.0)
Hemoglobin: 14 g/dL (ref 12.0–15.0)
MCH: 30.7 pg (ref 26.0–34.0)
MCHC: 32.3 g/dL (ref 30.0–36.0)
MCV: 95.2 fL (ref 80.0–100.0)
Platelets: 195 10*3/uL (ref 150–400)
RBC: 4.56 MIL/uL (ref 3.87–5.11)
RDW: 13.1 % (ref 11.5–15.5)
WBC: 8.5 10*3/uL (ref 4.0–10.5)
nRBC: 0 % (ref 0.0–0.2)

## 2020-02-17 LAB — SARS CORONAVIRUS 2 BY RT PCR (HOSPITAL ORDER, PERFORMED IN ~~LOC~~ HOSPITAL LAB): SARS Coronavirus 2: NEGATIVE

## 2020-02-17 MED ORDER — SODIUM CHLORIDE 0.9% FLUSH
3.0000 mL | Freq: Two times a day (BID) | INTRAVENOUS | Status: DC
  Administered 2020-02-17 – 2020-02-27 (×8): 3 mL via INTRAVENOUS

## 2020-02-17 MED ORDER — HYDROCORTISONE ACETATE 25 MG RE SUPP
25.0000 mg | Freq: Two times a day (BID) | RECTAL | Status: DC
  Administered 2020-02-18 – 2020-02-24 (×4): 25 mg via RECTAL
  Filled 2020-02-17 (×25): qty 1

## 2020-02-17 MED ORDER — POTASSIUM CHLORIDE CRYS ER 20 MEQ PO TBCR: 20.0000 meq | EXTENDED_RELEASE_TABLET | Freq: Once | ORAL | Status: DC

## 2020-02-17 MED ORDER — GUAIFENESIN-DM 100-10 MG/5ML PO SYRP: 15.0000 mL | ORAL_SOLUTION | ORAL | Status: DC | PRN

## 2020-02-17 MED ORDER — AMIODARONE HCL 200 MG PO TABS
100.0000 mg | ORAL_TABLET | Freq: Every day | ORAL | Status: DC
  Administered 2020-02-18 – 2020-02-27 (×9): 100 mg via ORAL
  Filled 2020-02-17 (×9): qty 1

## 2020-02-17 MED ORDER — LUTEIN-ZEAXANTHIN 25-5 MG PO CAPS: 1.0000 | ORAL_CAPSULE | Freq: Two times a day (BID) | ORAL | Status: DC

## 2020-02-17 MED ORDER — METOPROLOL TARTRATE 5 MG/5ML IV SOLN: 2.0000 mg | INTRAVENOUS | Status: DC | PRN

## 2020-02-17 MED ORDER — ADULT MULTIVITAMIN W/MINERALS CH
1.0000 | ORAL_TABLET | Freq: Every day | ORAL | Status: DC
  Administered 2020-02-18 – 2020-02-27 (×9): 1 via ORAL
  Filled 2020-02-17 (×9): qty 1

## 2020-02-17 MED ORDER — ACETAMINOPHEN 325 MG PO TABS
325.0000 mg | ORAL_TABLET | ORAL | Status: DC | PRN
  Administered 2020-02-20 – 2020-02-26 (×7): 650 mg via ORAL
  Filled 2020-02-17 (×7): qty 2

## 2020-02-17 MED ORDER — PHENOL 1.4 % MT LIQD
1.0000 | OROMUCOSAL | Status: DC | PRN
  Filled 2020-02-17: qty 177

## 2020-02-17 MED ORDER — SODIUM CHLORIDE 0.9% FLUSH: 3.0000 mL | INTRAVENOUS | Status: DC | PRN

## 2020-02-17 MED ORDER — PANTOPRAZOLE SODIUM 40 MG PO TBEC
40.0000 mg | DELAYED_RELEASE_TABLET | Freq: Every day | ORAL | Status: DC
  Administered 2020-02-18 – 2020-02-27 (×9): 40 mg via ORAL
  Filled 2020-02-17 (×9): qty 1

## 2020-02-17 MED ORDER — TRIAMTERENE-HCTZ 37.5-25 MG PO CAPS
1.0000 | ORAL_CAPSULE | Freq: Every day | ORAL | Status: DC
  Filled 2020-02-17: qty 1

## 2020-02-17 MED ORDER — DOCUSATE SODIUM 100 MG PO CAPS
100.0000 mg | ORAL_CAPSULE | Freq: Two times a day (BID) | ORAL | Status: DC
  Administered 2020-02-18 – 2020-02-27 (×16): 100 mg via ORAL
  Filled 2020-02-17 (×16): qty 1

## 2020-02-17 MED ORDER — METOPROLOL SUCCINATE ER 25 MG PO TB24
12.5000 mg | ORAL_TABLET | Freq: Two times a day (BID) | ORAL | Status: DC
  Administered 2020-02-17 – 2020-02-27 (×18): 12.5 mg via ORAL
  Filled 2020-02-17 (×18): qty 1

## 2020-02-17 MED ORDER — HEPARIN (PORCINE) 25000 UT/250ML-% IV SOLN
900.0000 [IU]/h | INTRAVENOUS | Status: DC
  Administered 2020-02-17: 900 [IU]/h via INTRAVENOUS
  Filled 2020-02-17: qty 250

## 2020-02-17 MED ORDER — ACETAMINOPHEN 325 MG RE SUPP
325.0000 mg | RECTAL | Status: DC | PRN
  Filled 2020-02-17: qty 2

## 2020-02-17 MED ORDER — SODIUM CHLORIDE 0.9 % IV SOLN
250.0000 mL | INTRAVENOUS | Status: DC | PRN
  Administered 2020-02-19: 250 mL via INTRAVENOUS

## 2020-02-17 MED ORDER — LABETALOL HCL 5 MG/ML IV SOLN: 10.0000 mg | INTRAVENOUS | Status: DC | PRN

## 2020-02-17 MED ORDER — ALUM & MAG HYDROXIDE-SIMETH 200-200-20 MG/5ML PO SUSP: 15.0000 mL | ORAL | Status: DC | PRN

## 2020-02-17 MED ORDER — HYDRALAZINE HCL 20 MG/ML IJ SOLN: 5.0000 mg | INTRAMUSCULAR | Status: DC | PRN

## 2020-02-17 MED ORDER — LEVOTHYROXINE SODIUM 112 MCG PO TABS
112.0000 ug | ORAL_TABLET | Freq: Every day | ORAL | Status: DC
  Administered 2020-02-18 – 2020-02-27 (×10): 112 ug via ORAL
  Filled 2020-02-17 (×10): qty 1

## 2020-02-17 MED ORDER — ONDANSETRON HCL 4 MG/2ML IJ SOLN: 4.0000 mg | Freq: Four times a day (QID) | INTRAMUSCULAR | Status: DC | PRN

## 2020-02-17 NOTE — ED Triage Notes (Signed)
Pt. Stated, I have enlarged vessel in my left upper arm causing my arm to hurt periodically. Dr. Olena Heckle told me to come here . I had a scan yesterday

## 2020-02-17 NOTE — Progress Notes (Addendum)
ANTICOAGULATION CONSULT NOTE - Initial Consult  Pharmacy Consult for Heparin Indication: atrial fibrillation   No Known Allergies  Patient Measurements: Height: 5\' 3"  (160 cm) Weight: 63 kg (139 lb) IBW/kg (Calculated) : 52.4 Heparin Dosing Weight: 63  Vital Signs: Temp: 98.1 F (36.7 C) (07/17 1301) Temp Source: Oral (07/17 1301) BP: 159/80 (07/17 1615) Pulse Rate: 64 (07/17 1615)  Labs: Recent Labs    02/17/20 1551  HGB 14.0  HCT 43.4  PLT 195  CREATININE 0.94    Estimated Creatinine Clearance: 31.3 mL/min (by C-G formula based on SCr of 0.94 mg/dL).   Medical History: Past Medical History:  Diagnosis Date  . Aortic insufficiency    Mild  . Aortic stenosis    Moderate to severe  . 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  s/p Left brachial, radial, ulnar, axillary embolectomy 08/2016.  Marland Kitchen Atypical atrial flutter ([place])   . Embolic stroke involving right cerebellar artery ([place])   . Essential hypertension   . Hypothyroidism   . Ischemia of extremity   . Memory loss 11/26/2016  . Moderate to severe aortic stenosis   . PAF (paroxysmal atrial fibrillation) ([place])   . Paroxysmal atrial tachycardia ([place])   . Stroke ([place]) 08/2016  . Syncope    a. s/p ILR 01/2016.    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 Pharmacy consulted to bridge from apixaban to heparin. Plan to repair pseudoaneurysm in OR on 7/20. CBC wnl. No s/sx bleeding.  Last dose of apixaban today 7/17 at 0900.  Goal of Therapy:  aPTT 66-120 seconds Monitor platelets by anticoagulation protocol: Yes   Plan:  - No bolus d/t last apixaban dose this morning - Start heparin infusion at 900 units/hr at 2100 - Will check aPTT and heparin level daily and monitor for correlation. - Check aPTT level ~6 hours after start of infusion and daily while on heparin. - Continue to monitor H&H and  platelets  Fara Olden, PharmD PGY-1 Pharmacy Resident 02/17/2020 5:16 PM

## 2020-02-17 NOTE — H&P (Signed)
Patient name: Christine Pugh MRN: [id number] DOB: July 10, 1922 Sex: female  REASON FOR CONSULT: Left brachial artery aneurysm  HPI: Christine Pugh is a 86 y.o. female, who noticed a lump in her left mid upper arm a few months ago.  This was not really bothersome to her initially.  However, over the past few weeks it has progressively enlarged in size.  She does not really have pain from this but is bothered by how rapidly it is increasing in size.  Of note she had a brachial embolectomy through radial and ulnar cutdowns in 2018.  She was noted to have a high brachial bifurcation at that time.  Review of the operative note does not show an arteriotomy in the brachial artery but certainly Fogarty catheters were passed up this.  The patient is on chronic Eliquis for atrial fibrillation.  She has had a previous embolic stroke from her A. fib as well.  Other medical problems include aortic stenosis, hypertension which have been stable.  Past Medical History:  Diagnosis Date  . Aortic insufficiency    Mild  . Aortic stenosis    Moderate to severe  . 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  s/p Left brachial, radial, ulnar, axillary embolectomy 08/2016.  Marland Kitchen Atypical atrial flutter ([place])   . Embolic stroke involving right cerebellar artery ([place])   . Essential hypertension   . Hypothyroidism   . Ischemia of extremity   . Memory loss 11/26/2016  . Moderate to severe aortic stenosis   . PAF (paroxysmal atrial fibrillation) ([place])   . Paroxysmal atrial tachycardia ([place])   . Stroke ([place]) 08/2016  . Syncope    a. s/p ILR 01/2016.   Past Surgical History:  Procedure Laterality Date  . APPENDECTOMY    . BREAST LUMPECTOMY    . dental implant    . EP IMPLANTABLE DEVICE N/A 01/10/2016   Procedure: Loop Recorder Insertion;  Surgeon: [name], MD;  Location: [facility];  Service: Cardiovascular;  Laterality: N/A;  . IR GENERIC HISTORICAL  08/23/2016   IR US GUIDE VASC ACCESS  RIGHT 08/23/2016 Corrie Mckusick, DO [facility]  . IR GENERIC HISTORICAL  08/23/2016   IR PERCUTANEOUS ART THROMBECTOMY/INFUSION INTRACRANIAL INC DIAG ANGIO 08/23/2016 Corrie Mckusick, DO [facility]  . IR GENERIC HISTORICAL  08/23/2016   IR ANGIO INTRA EXTRACRAN SEL COM CAROTID INNOMINATE UNI L MOD SED 08/23/2016 Corrie Mckusick, DO [facility]  . RADIOLOGY WITH ANESTHESIA Bilateral 08/23/2016   Procedure: RADIOLOGY WITH ANESTHESIA;  Surgeon: Medication Radiologist, MD;  Location: [facility];  Service: Radiology;  Laterality: Bilateral;  . THROMBECTOMY BRACHIAL ARTERY Left 08/23/2016   Procedure: Left Radial, Ulnar, Brachial and Axillary Embolectomy;  Surgeon: Elam Dutch, MD;  Location: [facility];  Service: Vascular;  Laterality: Left;  . THYROIDECTOMY    . TONSILLECTOMY    . TOTAL ABDOMINAL HYSTERECTOMY    . VITRECTOMY AND CATARACT      Family History  Problem Relation Age of Onset  . Coronary artery disease Other   . Stroke Mother   . Stroke Sister     SOCIAL HISTORY: Social History   Socioeconomic History  . Marital status: Widowed    Spouse name: Not on file  . Number of children: Not on file  . Years of education: Not on file  . Highest education level: Not on file  Occupational History  . Occupation: Retired  Tobacco Use  . Smoking status: Never Smoker  .  Smokeless tobacco: Never Used  Substance and Sexual Activity  . Alcohol use: No    Alcohol/week: 0.0 standard drinks  . Drug use: No  . Sexual activity: Not on file  Other Topics Concern  . Not on file  Social History Narrative   Pt lives in [place] alone.   Widowed   Retired Freight forwarder of Radio broadcast assistant Strain:   . Difficulty of Paying Living Expenses:   Food Insecurity:   . Worried About Charity fundraiser in the Last Year:   . Arboriculturist in the Last Year:   Transportation Needs:   . Film/video editor (Medical):   Marland Kitchen Lack of Transportation (Non-Medical):   Physical  Activity:   . Days of Exercise per Week:   . Minutes of Exercise per Session:   Stress:   . Feeling of Stress :   Social Connections:   . Frequency of Communication with Friends and Family:   . Frequency of Social Gatherings with Friends and Family:   . Attends Religious Services:   . Active Member of Clubs or Organizations:   . Attends Archivist Meetings:   Marland Kitchen Marital Status:   Intimate Partner Violence:   . Fear of Current or Ex-Partner:   . Emotionally Abused:   Marland Kitchen Physically Abused:   . Sexually Abused:     No Known Allergies  Current Facility-Administered Medications  Medication Dose Route Frequency Provider Last Rate Last Admin  . 0.9 %  sodium chloride infusion  250 mL Intravenous PRN Elam Dutch, MD      . acetaminophen (TYLENOL) tablet 325-650 mg  325-650 mg Oral Q4H PRN Elam Dutch, MD       Or  . acetaminophen (TYLENOL) suppository 325-650 mg  325-650 mg Rectal Q4H PRN Elam Dutch, MD      . alum & mag hydroxide-simeth (MAALOX/MYLANTA) 200-200-20 MG/5ML suspension 15-30 mL  15-30 mL Oral Q2H PRN Elam Dutch, MD      . amiodarone (PACERONE) tablet 100 mg  100 mg Oral Daily Donnika Kucher, Jessy Oto, MD      . docusate sodium (COLACE) capsule 100 mg  100 mg Oral BID Elam Dutch, MD      . guaiFENesin-dextromethorphan (ROBITUSSIN DM) 100-10 MG/5ML syrup 15 mL  15 mL Oral Q4H PRN Elam Dutch, MD      . hydrALAZINE (APRESOLINE) injection 5 mg  5 mg Intravenous Q20 Min PRN Elam Dutch, MD      . hydrocortisone (ANUSOL-HC) suppository 25 mg  25 mg Rectal BID Elam Dutch, MD      . labetalol (NORMODYNE) injection 10 mg  10 mg Intravenous Q10 min PRN Elam Dutch, MD      . Derrill Memo ON 02/18/2020] levothyroxine (SYNTHROID) tablet 112 mcg  112 mcg Oral QAC breakfast Sumner Boesch, Jessy Oto, MD      . Lutein-Zeaxanthin 25-5 MG CAPS 1 capsule  1 capsule Oral BID Elam Dutch, MD      . metoprolol succinate (TOPROL-XL) 24 hr tablet  12.5 mg  12.5 mg Oral BID Elam Dutch, MD      . metoprolol tartrate (LOPRESSOR) injection 2-5 mg  2-5 mg Intravenous Q2H PRN Elam Dutch, MD      . multivitamin tablet 1 tablet  1 tablet Oral Daily Johnwilliam Shepperson E, MD      . ondansetron [facility]) injection 4 mg  4 mg Intravenous  Q6H PRN Elam Dutch, MD      . pantoprazole (PROTONIX) EC tablet 40 mg  40 mg Oral Daily Christapher Gillian E, MD      . phenol (CHLORASEPTIC) mouth spray 1 spray  1 spray Mouth/Throat PRN Elam Dutch, MD      . potassium chloride SA (KLOR-CON) CR tablet 20-40 mEq  20-40 mEq Oral Once Ruta Hinds E, MD      . sodium chloride flush (NS) 0.9 % injection 3 mL  3 mL Intravenous Q12H Rinnah Peppel E, MD      . sodium chloride flush (NS) 0.9 % injection 3 mL  3 mL Intravenous PRN Elam Dutch, MD      . triamterene-hydrochlorothiazide (DYAZIDE) 37.5-25 MG per capsule 1 capsule  1 capsule Oral Daily Elam Dutch, MD       Current Outpatient Medications  Medication Sig Dispense Refill  . amiodarone (PACERONE) 200 MG tablet TAKE 1/2 TABLET BY MOUTH DAILY 45 tablet 1  . ELIQUIS 5 MG TABS tablet TAKE 1 TABLET BY MOUTH TWICE A DAY 180 tablet 2  . hydrocortisone (ANUSOL-HC) 25 MG suppository Place 1 suppository (25 mg total) rectally 2 (two) times daily. 6 suppository 0  . levothyroxine (SYNTHROID, LEVOTHROID) 112 MCG tablet Take 1 tablet (112 mcg total) by mouth daily before breakfast. 30 tablet 0  . Lutein-Zeaxanthin 25-5 MG CAPS Take 1 capsule by mouth 2 (two) times daily.    . metoprolol succinate (TOPROL-XL) 25 MG 24 hr tablet TAKE 0.5 TABLETS (12.5 MG TOTAL) BY MOUTH 2 (TWO) TIMES DAILY. 180 tablet 1  . Multiple Vitamin (MULTIVITAMIN) tablet Take 1 tablet by mouth daily.      Marland Kitchen triamcinolone cream (KENALOG) 0.1 % Apply 1 application topically daily as needed (irritated skin).     Marland Kitchen triamterene-hydrochlorothiazide (DYAZIDE) 37.5-25 MG capsule Take 1 each (1 capsule total) by mouth daily. 30  capsule 0    ROS:   General:  No weight loss, Fever, chills  HEENT: No recent headaches, no nasal bleeding, no visual changes, no sore throat  Neurologic: No dizziness, blackouts, seizures. No recent symptoms of stroke or mini- stroke. No recent episodes of slurred speech, or temporary blindness.  Cardiac: No recent episodes of chest pain/pressure, no shortness of breath at rest.  No shortness of breath with exertion.  + history of atrial fibrillation or irregular heartbeat  Vascular: No history of rest pain in feet.  No history of claudication.  No history of non-healing ulcer, No history of DVT   Pulmonary: No home oxygen, no productive cough, no hemoptysis,  No asthma or wheezing  Musculoskeletal:  [X]  Arthritis, [ ]  Low back pain,  [ ]  Joint pain  Hematologic:No history of hypercoagulable state.  No history of easy bleeding.  No history of anemia  Gastrointestinal: No hematochezia or melena,  No gastroesophageal reflux, no trouble swallowing  Urinary: [ ]  chronic Kidney disease, [ ]  on HD - [ ]  MWF or [ ]  TTHS, [ ]  Burning with urination, [ ]  Frequent urination, [ ]  Difficulty urinating;   Skin: No rashes  Psychological: No history of anxiety,  No history of depression   Physical Examination  Vitals:   02/17/20 1301 02/17/20 1615  BP: 138/68 (!) 159/80  Pulse: (!) 58 64  Resp: 16 17  Temp: 98.1 F (36.7 C)   TempSrc: Oral   SpO2: 96% 95%  Weight: 63 kg   Height: 5\' 3"  (1.6 m)     Body  mass index is 24.62 kg/m.  General:  Alert and oriented, no acute distress HEENT: Normal Neck: No JVD Pulmonary: Clear to auscultation bilaterally Cardiac: Regular Rate and Rhythm Abdomen: Soft, non-tender, non-distended Skin: No rash Extremity Pulses:  2+ radial, brachial bilaterally Musculoskeletal: 5 x 7 cm pulsatile mass mid left upper arm not tender to palpation no skin compromise no ulceration Neurologic: Upper and lower extremity motor 5/5 and symmetric  DATA:   CBC    Component Value Date/Time   WBC 8.5 02/17/2020 1551   RBC 4.56 02/17/2020 1551   HGB 14.0 02/17/2020 1551   HCT 43.4 02/17/2020 1551   PLT 195 02/17/2020 1551   MCV 95.2 02/17/2020 1551   MCH 30.7 02/17/2020 1551   MCHC 32.3 02/17/2020 1551   RDW 13.1 02/17/2020 1551   LYMPHSABS 1.1 08/29/2016 0516   MONOABS 1.3 (H) 08/29/2016 0516   EOSABS 0.3 08/29/2016 0516   BASOSABS 0.0 08/29/2016 0516    BMET    Component Value Date/Time   NA 137 02/17/2020 1551   K 4.5 02/17/2020 1551   CL 99 02/17/2020 1551   CO2 25 02/17/2020 1551   GLUCOSE 86 02/17/2020 1551   BUN 14 02/17/2020 1551   CREATININE 0.94 02/17/2020 1551   CALCIUM 9.1 02/17/2020 1551   GFRNONAA 51 (L) 02/17/2020 1551   GFRAA 59 (L) 02/17/2020 1551    Covid test is pending  I reviewed images from the patient's CT of the left upper extremity performed at [facility] yesterday.  This shows a brachial artery aneurysm or pseudoaneurysm of similar size as my exam.  There is poor contrast opacification in certain areas but certainly part of the aneurysm fills with contrast.  It does appear that the radial and ulnar arteries do both fill distally as well as the interosseous.  ASSESSMENT: Expanding left brachial artery pseudoaneurysm potentially secondary to prior trauma from her previous embolectomy in 2018 true aneurysm would be much less likely.   PLAN: 1.  Patient will be admitted today to bridge her Eliquis with heparin.  Plan will be to repair the brachial artery pseudoaneurysm or replaced of the brachial artery with an interposition graft of saphenous vein on Tuesday, February 20, 2020.  Plan was discussed with patient and her son today.  They understand and agree to proceed.   Ruta Hinds, MD [facility] Office: [phone] Pager: [phone]

## 2020-02-17 NOTE — Progress Notes (Signed)
Patient arrived from ED via stretcher. CHG bath performed, telemetry applied, CCMD called, oriented to unit, VSS, and set up for snack with drink. Pt resting with call bell within reach.  Will continue to monitor.

## 2020-02-17 NOTE — ED Provider Notes (Signed)
[facility] EMERGENCY DEPARTMENT Provider Note   CSN: [id number] Arrival date & time: 02/17/20  1256     History No chief complaint on file.   Christine Pugh is a 86 y.o. female.  HPI     86 year old female presents today complaining of left upper arm pain and states that she had a study done yesterday and was told she needed to come to the ED for evaluation.  She complains of pain in the left upper arm for the past 2 to 3 months.  The area has become more painful and grown in the past 2 to 3 days.  Her physician ordered an outpatient CAT scan that was done yesterday.  Reveals a 5.1 cm x 4.3 cm x 8.6 cm mass contiguous with the brachial artery.  Patient states it is always painful sometimes at night.  Is currently not painful.  She denies any numbness tingling or weakness distal to the area. Patient lives independently, she reports she passed her driver's test in 2297, but states she quit driving because her son won't let her.  She walks with a walker since her stroke in 2018 which sounds like it was embolic.  Soft tissues   There is a mixed attenuating lesion in the medial soft tissues of  the upper arm measuring 5.1 cm transverse by 4.3 cm AP by 8.6 cm  craniocaudal. A central hyperattenuating component of the lesion has  Hounsfield units of 101. The lesion is contiguous with the brachial  artery and has similar Hounsfield unit measurements similar to the  artery. More peripherally and inferiorly, the lesion is low  attenuating with Hounsfield unit measurements of 25.2. The lesion  abuts the brachialis and triceps muscles but is not within muscle.   Imaged intrathoracic contents demonstrate cardiomegaly and extensive  aortic and coronary atherosclerosis. Left renal cysts are noted.   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   Critical Value/emergent results were called by telephone at the time  of  interpretation on 02/17/2020 at 10:55 am to provider CHASTITY  EDWARDS, LPN working with the on-call physician for the ordering  physician, who verbally acknowledged these results.    Electronically Signed   By: Inge Rise M.D.   Past Medical History:  Diagnosis Date  . Aortic insufficiency    Mild  . Aortic stenosis    Moderate to severe  . 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  s/p Left brachial, radial, ulnar, axillary embolectomy 08/2016.  Marland Kitchen Atypical atrial flutter ([place])   . Embolic stroke involving right cerebellar artery ([place])   . Essential hypertension   . Hypothyroidism   . Ischemia of extremity   . Memory loss 11/26/2016  . Moderate to severe aortic stenosis   . PAF (paroxysmal atrial fibrillation) ([place])   . Paroxysmal atrial tachycardia ([place])   . Stroke ([place]) 08/2016  . Syncope    a. s/p ILR 01/2016.    Patient Active Problem List   Diagnosis Date Noted  . Memory loss 11/26/2016  . PAF (paroxysmal atrial fibrillation) ([place]) 10/07/2016  . 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 Hemorrhoids 09/03/2016  . Embolic stroke involving middle cerebral artery ([place]) 08/28/2016  . Embolism of right middle cerebral artery 08/24/2016  . Thromboembolic stroke ([place]) 98/92/1194  . Ischemia of extremity 08/23/2016  . Syncope 01/06/2016  . Atypical atrial flutter ([place]) 10/28/2015  . Essential hypertension 10/28/2015  .  Aortic valve disorder 05/19/2009  . SVT/ PSVT/ PAT 05/19/2009    Past Surgical History:  Procedure Laterality Date  . APPENDECTOMY    . BREAST LUMPECTOMY    . dental implant    . EP IMPLANTABLE DEVICE N/A 01/10/2016   Procedure: Loop Recorder Insertion;  Surgeon: [name], MD;  Location: [facility];  Service: Cardiovascular;  Laterality: N/A;  . IR GENERIC HISTORICAL  08/23/2016   IR US GUIDE VASC ACCESS RIGHT 08/23/2016 Corrie Mckusick, DO [facility]  . IR GENERIC HISTORICAL  08/23/2016   IR  PERCUTANEOUS ART THROMBECTOMY/INFUSION INTRACRANIAL INC DIAG ANGIO 08/23/2016 Corrie Mckusick, DO [facility]  . IR GENERIC HISTORICAL  08/23/2016   IR ANGIO INTRA EXTRACRAN SEL COM CAROTID INNOMINATE UNI L MOD SED 08/23/2016 Corrie Mckusick, DO [facility]  . RADIOLOGY WITH ANESTHESIA Bilateral 08/23/2016   Procedure: RADIOLOGY WITH ANESTHESIA;  Surgeon: Medication Radiologist, MD;  Location: [facility];  Service: Radiology;  Laterality: Bilateral;  . THROMBECTOMY BRACHIAL ARTERY Left 08/23/2016   Procedure: Left Radial, Ulnar, Brachial and Axillary Embolectomy;  Surgeon: Elam Dutch, MD;  Location: [facility];  Service: Vascular;  Laterality: Left;  . THYROIDECTOMY    . TONSILLECTOMY    . TOTAL ABDOMINAL HYSTERECTOMY    . VITRECTOMY AND CATARACT       OB History   No obstetric history on file.     Family History  Problem Relation Age of Onset  . Coronary artery disease Other   . Stroke Mother   . Stroke Sister     Social History   Tobacco Use  . Smoking status: Never Smoker  . Smokeless tobacco: Never Used  Substance Use Topics  . Alcohol use: No    Alcohol/week: 0.0 standard drinks  . Drug use: No    Home Medications Prior to Admission medications   Medication Sig Start Date End Date Taking? Authorizing Provider  amiodarone (PACERONE) 200 MG tablet TAKE 1/2 TABLET BY MOUTH DAILY 12/29/19   Satira Sark, MD  ELIQUIS 5 MG TABS tablet TAKE 1 TABLET BY MOUTH TWICE A DAY 12/15/19   Satira Sark, MD  hydrocortisone (ANUSOL-HC) 25 MG suppository Place 1 suppository (25 mg total) rectally 2 (two) times daily. 09/04/16   Love, Ivan Anchors, PA-C  levothyroxine (SYNTHROID, LEVOTHROID) 112 MCG tablet Take 1 tablet (112 mcg total) by mouth daily before breakfast. 09/05/16   Love, Ivan Anchors, PA-C  Lutein-Zeaxanthin 25-5 MG CAPS Take 1 capsule by mouth 2 (two) times daily.    [provider]  metoprolol succinate (TOPROL-XL) 25 MG 24 hr tablet TAKE 0.5 TABLETS (12.5 MG TOTAL) BY MOUTH  2 (TWO) TIMES DAILY. 07/03/19   Satira Sark, MD  Multiple Vitamin (MULTIVITAMIN) tablet Take 1 tablet by mouth daily.      [provider]  triamcinolone cream (KENALOG) 0.1 % Apply 1 application topically daily as needed (irritated skin).  09/07/12   [provider]  triamterene-hydrochlorothiazide (DYAZIDE) 37.5-25 MG capsule Take 1 each (1 capsule total) by mouth daily. 09/04/16   Bary Leriche, PA-C    Allergies    Patient has no known allergies.  Review of Systems   Review of Systems  All other systems reviewed and are negative.   Physical Exam Updated Vital Signs BP 138/68 (BP Location: Right Arm)   Pulse (!) 58   Temp 98.1 F (36.7 C) (Oral)   Resp 16   Ht 1.6 m (5\' 3" )   Wt 63 kg  SpO2 96%   BMI 24.62 kg/m   Physical Exam Vitals and nursing note reviewed.  Constitutional:      Appearance: Normal appearance.  HENT:     Head: Normocephalic.     Right Ear: External ear normal.     Left Ear: External ear normal.     Nose: Nose normal.     Mouth/Throat:     Pharynx: Oropharynx is clear.  Eyes:     Pupils: Pupils are equal, round, and reactive to light.  Cardiovascular:     Rate and Rhythm: Normal rate.  Pulmonary:     Effort: Pulmonary effort is normal.  Abdominal:     General: Abdomen is flat.     Palpations: Abdomen is soft.  Musculoskeletal:        General: Swelling present. Normal range of motion.     Cervical back: Normal range of motion.     Comments: Left upper extremity with large swollen area to brachial artery, pulsatile Left radial artery palpable Neuromuscularly intact  Skin:    Capillary Refill: Capillary refill takes less than 2 seconds.  Neurological:     General: No focal deficit present.     Mental Status: She is alert.  Psychiatric:        Mood and Affect: Mood normal.     ED Results / Procedures / Treatments   Labs (all labs ordered are listed, but only abnormal results are displayed) Labs Reviewed  SARS  CORONAVIRUS 2 BY RT PCR (HOSPITAL ORDER, [facility])  CBC  BASIC METABOLIC PANEL    EKG None  Radiology No results found.  Procedures Procedures (including critical care time)  Medications Ordered in ED Medications - No data to display  ED Course  I have reviewed the triage vital signs and the nursing notes.  Pertinent labs & imaging results that were available during my care of the patient were reviewed by me and considered in my medical decision making (see chart for details).    MDM Rules/Calculators/A&P                          3:35 PM Discussed with Dr. 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

## 2020-02-18 ENCOUNTER — Inpatient Hospital Stay (HOSPITAL_COMMUNITY): Payer: Medicare Other

## 2020-02-18 LAB — CBC
HCT: 39.6 % (ref 36.0–46.0)
Hemoglobin: 13 g/dL (ref 12.0–15.0)
MCH: 31.3 pg (ref 26.0–34.0)
MCHC: 32.8 g/dL (ref 30.0–36.0)
MCV: 95.2 fL (ref 80.0–100.0)
Platelets: 203 10*3/uL (ref 150–400)
RBC: 4.16 MIL/uL (ref 3.87–5.11)
RDW: 13.1 % (ref 11.5–15.5)
WBC: 6.8 10*3/uL (ref 4.0–10.5)
nRBC: 0 % (ref 0.0–0.2)

## 2020-02-18 LAB — APTT
aPTT: 86 seconds — ABNORMAL HIGH (ref 24–36)
aPTT: 116 seconds — ABNORMAL HIGH (ref 24–36)
aPTT: 124 seconds — ABNORMAL HIGH (ref 24–36)

## 2020-02-18 LAB — HEPARIN LEVEL (UNFRACTIONATED): Heparin Unfractionated: >2.2 IU/mL — ABNORMAL HIGH (ref 0.30–0.70)

## 2020-02-18 MED ORDER — HEPARIN (PORCINE) 25000 UT/250ML-% IV SOLN
650.0000 [IU]/h | INTRAVENOUS | Status: DC
  Administered 2020-02-19: 650 [IU]/h via INTRAVENOUS
  Filled 2020-02-18: qty 250

## 2020-02-18 MED ORDER — TRIAMTERENE-HCTZ 37.5-25 MG PO TABS
1.0000 | ORAL_TABLET | Freq: Every day | ORAL | Status: DC
  Administered 2020-02-18 – 2020-02-27 (×9): 1 via ORAL
  Filled 2020-02-18 (×9): qty 1

## 2020-02-18 NOTE — Progress Notes (Signed)
ANTICOAGULATION CONSULT NOTE - Initial Consult  Pharmacy Consult for Heparin Indication: atrial fibrillation   No Known Allergies  Patient Measurements: Height: 5\' 3"  (160 cm) Weight: 63 kg (139 lb) IBW/kg (Calculated) : 52.4 Heparin Dosing Weight: 63  Vital Signs: Temp: 97.9 F (36.6 C) (07/18 1156) Temp Source: Axillary (07/18 1156) BP: 114/65 (07/18 1156) Pulse Rate: 60 (07/18 1156)  Labs: Recent Labs    02/17/20 1551 02/18/20 0308 02/18/20 1159  HGB 14.0 13.0  --   HCT 43.4 39.6  --   PLT 195 203  --   APTT  --  86* 116*  HEPARINUNFRC  --  >2.20*  --   CREATININE 0.94  --   --     Estimated Creatinine Clearance: 31.3 mL/min (by C-G formula based on SCr of 0.94 mg/dL).   Medical History: Past Medical History:  Diagnosis Date  . Aortic insufficiency    Mild  . Aortic stenosis    Moderate to severe  . 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  s/p Left brachial, radial, ulnar, axillary embolectomy 08/2016.  Marland Kitchen Atypical atrial flutter ([place])   . Embolic stroke involving right cerebellar artery ([place])   . Essential hypertension   . Hypothyroidism   . Ischemia of extremity   . Memory loss 11/26/2016  . Moderate to severe aortic stenosis   . PAF (paroxysmal atrial fibrillation) ([place])   . Paroxysmal atrial tachycardia ([place])   . Stroke ([place]) 08/2016  . Syncope    a. s/p ILR 01/2016.    Medications:  Medications Prior to Admission  Medication Sig Dispense Refill Last Dose  . amiodarone (PACERONE) 200 MG tablet TAKE 1/2 TABLET BY MOUTH DAILY 45 tablet 1 02/17/2020 at Unknown time  . ELIQUIS 5 MG TABS tablet TAKE 1 TABLET BY MOUTH TWICE A DAY 180 tablet 2 02/17/2020 at Unknown time  . levothyroxine (SYNTHROID, LEVOTHROID) 112 MCG tablet Take 1 tablet (112 mcg total) by mouth daily before breakfast. 30 tablet 0 02/17/2020 at Unknown time  . Lutein-Zeaxanthin 25-5 MG CAPS Take 1 capsule by mouth 2 (two) times daily.   02/17/2020 at Unknown time  .  metoprolol succinate (TOPROL-XL) 25 MG 24 hr tablet TAKE 0.5 TABLETS (12.5 MG TOTAL) BY MOUTH 2 (TWO) TIMES DAILY. 180 tablet 1 02/17/2020 at Unknown time  . Multiple Vitamin (MULTIVITAMIN) tablet Take 1 tablet by mouth daily.     02/17/2020 at Unknown time  . triamcinolone cream (KENALOG) 0.1 % Apply 1 application topically daily as needed (irritated skin).    Past Month at Unknown time  . triamterene-hydrochlorothiazide (DYAZIDE) 37.5-25 MG capsule Take 1 each (1 capsule total) by mouth daily. 30 capsule 0 02/17/2020 at Unknown time   Assessment: 86 yo female with brachial artery aneurysm or pseudoaneurysm. Pharmacy consulted to bridge from apixaban to heparin. Plan to repair pseudoaneurysm in OR on 7/20.  Last dose of apixaban 7/17 at 0900. aPTT and HL still do not correlate and aPTT is supratherapeutic. CBC wnl. No s/sx bleeding.  Goal of Therapy:  aPTT 66-102 seconds (HL 0.3-0.7) Monitor platelets by anticoagulation protocol: Yes  Plan:  - Decrease heparin infusion to 800 units/hr - Check aPTT level ~8 hours after change in dose - Will check aPTT and heparin level daily and monitor for correlation. - Continue to monitor H&H and platelets  Romilda Garret, PharmD PGY1 Acute Care Pharmacy Resident Phone: [phone] 02/18/2020 12:52 PM  Please check [email or web address] for unit specific pharmacy phone numbers.

## 2020-02-18 NOTE — Progress Notes (Signed)
ANTICOAGULATION CONSULT NOTE   Pharmacy Consult for Heparin Indication: atrial fibrillation   No Known Allergies  Patient Measurements: Height: 5\' 3"  (160 cm) Weight: 63 kg (139 lb) IBW/kg (Calculated) : 52.4 Heparin Dosing Weight: 63  Vital Signs: Temp: 98.1 F (36.7 C) (07/18 2152) Temp Source: Oral (07/18 2152) BP: 112/61 (07/18 2152) Pulse Rate: 65 (07/18 2152)  Labs: Recent Labs    02/17/20 1551 02/18/20 0308 02/18/20 1159 02/18/20 2140  HGB 14.0 13.0  --   --   HCT 43.4 39.6  --   --   PLT 195 203  --   --   APTT  --  86* 116* 124*  HEPARINUNFRC  --  >2.20*  --   --   CREATININE 0.94  --   --   --     Estimated Creatinine Clearance: 31.3 mL/min (by C-G formula based on SCr of 0.94 mg/dL).   Medical History: Past Medical History:  Diagnosis Date  . Aortic insufficiency    Mild  . Aortic stenosis    Moderate to severe  . 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  s/p Left brachial, radial, ulnar, axillary embolectomy 08/2016.  Marland Kitchen Atypical atrial flutter ([place])   . Embolic stroke involving right cerebellar artery ([place])   . Essential hypertension   . Hypothyroidism   . Ischemia of extremity   . Memory loss 11/26/2016  . Moderate to severe aortic stenosis   . PAF (paroxysmal atrial fibrillation) ([place])   . Paroxysmal atrial tachycardia ([place])   . Stroke ([place]) 08/2016  . Syncope    a. s/p ILR 01/2016.    Medications:  Medications Prior to Admission  Medication Sig Dispense Refill Last Dose  . amiodarone (PACERONE) 200 MG tablet TAKE 1/2 TABLET BY MOUTH DAILY 45 tablet 1 02/17/2020 at Unknown time  . ELIQUIS 5 MG TABS tablet TAKE 1 TABLET BY MOUTH TWICE A DAY 180 tablet 2 02/17/2020 at Unknown time  . levothyroxine (SYNTHROID, LEVOTHROID) 112 MCG tablet Take 1 tablet (112 mcg total) by mouth daily before breakfast. 30 tablet 0 02/17/2020 at Unknown time  . Lutein-Zeaxanthin 25-5 MG CAPS Take 1 capsule by mouth 2 (two) times daily.   02/17/2020  at Unknown time  . metoprolol succinate (TOPROL-XL) 25 MG 24 hr tablet TAKE 0.5 TABLETS (12.5 MG TOTAL) BY MOUTH 2 (TWO) TIMES DAILY. 180 tablet 1 02/17/2020 at Unknown time  . Multiple Vitamin (MULTIVITAMIN) tablet Take 1 tablet by mouth daily.     02/17/2020 at Unknown time  . triamcinolone cream (KENALOG) 0.1 % Apply 1 application topically daily as needed (irritated skin).    Past Month at Unknown time  . triamterene-hydrochlorothiazide (DYAZIDE) 37.5-25 MG capsule Take 1 each (1 capsule total) by mouth daily. 30 capsule 0 02/17/2020 at Unknown time   Assessment: 86 yo female with brachial artery aneurysm or pseudoaneurysm. Pharmacy consulted to bridge from apixaban to heparin. Plan to repair pseudoaneurysm in OR on 7/20.  Last dose of apixaban 7/17 at 0900. aPTT and HL still do not correlate and aPTT is supratherapeutic.  -aPTT= 124 after decrease to 800 units/hr   Goal of Therapy:  aPTT 66-102 seconds (HL 0.3-0.7) Monitor platelets by anticoagulation protocol: Yes  Plan:  - Decrease heparin infusion to 650 units/hr - Check aPTT level ~8 hours after change in dose - Will check aPTT and heparin level daily and monitor for correlation. - Continue to monitor H&H and platelets  Hildred Laser, PharmD Clinical Pharmacist **Pharmacist  phone directory can now be found on [email or web address] ([email or web address]).  Listed under [place].

## 2020-02-18 NOTE — Progress Notes (Addendum)
  Progress Note    02/18/2020 8:51 AM Hospital Day 1  Subjective:  No complaints; going down to x-ray.  Afebrile VSS  Vitals:   02/18/20 0416 02/18/20 0735  BP: 123/62 119/67  Pulse: 62 (!) 54  Resp: 16 15  Temp: 97.8 F (36.6 C) 97.7 F (36.5 C)  SpO2: 92% 95%    Physical Exam: General:  No distress; resting comfortably Lungs:  Non labored Extremities:  Left hand is warm with motor in tact;   CBC    Component Value Date/Time   WBC 6.8 02/18/2020 0308   RBC 4.16 02/18/2020 0308   HGB 13.0 02/18/2020 0308   HCT 39.6 02/18/2020 0308   PLT 203 02/18/2020 0308   MCV 95.2 02/18/2020 0308   MCH 31.3 02/18/2020 0308   MCHC 32.8 02/18/2020 0308   RDW 13.1 02/18/2020 0308   LYMPHSABS 1.1 08/29/2016 0516   MONOABS 1.3 (H) 08/29/2016 0516   EOSABS 0.3 08/29/2016 0516   BASOSABS 0.0 08/29/2016 0516    BMET    Component Value Date/Time   NA 137 02/17/2020 1551   K 4.5 02/17/2020 1551   CL 99 02/17/2020 1551   CO2 25 02/17/2020 1551   GLUCOSE 86 02/17/2020 1551   BUN 14 02/17/2020 1551   CREATININE 0.94 02/17/2020 1551   CALCIUM 9.1 02/17/2020 1551   GFRNONAA 51 (L) 02/17/2020 1551   GFRAA 59 (L) 02/17/2020 1551    INR    Component Value Date/Time   INR 1.00 08/23/2016 0354     Intake/Output Summary (Last 24 hours) at 02/18/2020 0851 Last data filed at 02/18/2020 0300 Gross per 24 hour  Intake 41.5 ml  Output --  Net 41.5 ml     Assessment/Plan:  86 y.o. female with left brachial artery aneurysm  Hospital Day 1  -pt doing well this morning and leaving room to go to x-ray for cxr.  Left hand is warm and motor in tact.   -plan for surgery on Tuesday to repair left brachial artery psa.   -she is currently being bridged from Eliquis with heparin for afib  Leontine Locket, PA-C [facility] [phone] 02/18/2020 8:51 AM   Agree with above.  No change overnight Repair [place] transition to heparin  Ruta Hinds, MD [facility] Office: [phone]

## 2020-02-18 NOTE — Progress Notes (Signed)
[facility] for Heparin Indication: atrial fibrillation   No Known Allergies  Patient Measurements: Height: 5\' 3"  (160 cm) Weight: 63 kg (139 lb) IBW/kg (Calculated) : 52.4 Heparin Dosing Weight: 63  Vital Signs: Temp: 97.7 F (36.5 C) (07/17 2320) Temp Source: Oral (07/17 2320) BP: 126/66 (07/17 2320) Pulse Rate: 63 (07/17 2320)  Labs: Recent Labs    02/17/20 1551 02/18/20 0308  HGB 14.0 13.0  HCT 43.4 39.6  PLT 195 203  APTT  --  86*  CREATININE 0.94  --     Estimated Creatinine Clearance: 31.3 mL/min (by C-G formula based on SCr of 0.94 mg/dL).   Medical History: Past Medical History:  Diagnosis Date  . Aortic insufficiency    Mild  . Aortic stenosis    Moderate to severe  . 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  s/p Left brachial, radial, ulnar, axillary embolectomy 08/2016.  Marland Kitchen Atypical atrial flutter ([place])   . Embolic stroke involving right cerebellar artery ([place])   . Essential hypertension   . Hypothyroidism   . Ischemia of extremity   . Memory loss 11/26/2016  . Moderate to severe aortic stenosis   . PAF (paroxysmal atrial fibrillation) ([place])   . Paroxysmal atrial tachycardia ([place])   . Stroke ([place]) 08/2016  . Syncope    a. s/p ILR 01/2016.    Medications:  Medications Prior to Admission  Medication Sig Dispense Refill Last Dose  . amiodarone (PACERONE) 200 MG tablet TAKE 1/2 TABLET BY MOUTH DAILY 45 tablet 1 02/17/2020 at Unknown time  . ELIQUIS 5 MG TABS tablet TAKE 1 TABLET BY MOUTH TWICE A DAY 180 tablet 2 02/17/2020 at Unknown time  . levothyroxine (SYNTHROID, LEVOTHROID) 112 MCG tablet Take 1 tablet (112 mcg total) by mouth daily before breakfast. 30 tablet 0 02/17/2020 at Unknown time  . Lutein-Zeaxanthin 25-5 MG CAPS Take 1 capsule by mouth 2 (two) times daily.   02/17/2020 at Unknown time  . metoprolol succinate (TOPROL-XL) 25 MG 24 hr tablet TAKE 0.5 TABLETS (12.5 MG TOTAL) BY MOUTH 2  (TWO) TIMES DAILY. 180 tablet 1 02/17/2020 at Unknown time  . Multiple Vitamin (MULTIVITAMIN) tablet Take 1 tablet by mouth daily.     02/17/2020 at Unknown time  . triamcinolone cream (KENALOG) 0.1 % Apply 1 application topically daily as needed (irritated skin).    Past Month at Unknown time  . triamterene-hydrochlorothiazide (DYAZIDE) 37.5-25 MG capsule Take 1 each (1 capsule total) by mouth daily. 30 capsule 0 02/17/2020 at Unknown time   Assessment: 86 yo female with brachial artery aneurysm or pseudoaneurysm. Pharmacy consulted to bridge from apixaban to heparin. Plan to repair pseudoaneurysm in OR on 7/20. CBC wnl. No s/sx bleeding.  Last dose of apixaban today 7/17 at 0900.  7/18 AM update:  Initial aPTT therapeutic   Goal of Therapy:  Heparin level 0.3-0.7 units/mL APTT 66-102 secs Monitor platelets by anticoagulation protocol: Yes   Plan:  Cont heparin at 900 units/hr Confirmatory aPTT at [place], PharmD, [place] Pharmacist Phone: [phone]

## 2020-02-19 LAB — CBC
HCT: 40.8 % (ref 36.0–46.0)
Hemoglobin: 13.2 g/dL (ref 12.0–15.0)
MCH: 31.1 pg (ref 26.0–34.0)
MCHC: 32.4 g/dL (ref 30.0–36.0)
MCV: 96.2 fL (ref 80.0–100.0)
Platelets: 201 10*3/uL (ref 150–400)
RBC: 4.24 MIL/uL (ref 3.87–5.11)
RDW: 13.2 % (ref 11.5–15.5)
WBC: 6.7 10*3/uL (ref 4.0–10.5)
nRBC: 0 % (ref 0.0–0.2)

## 2020-02-19 LAB — HEPARIN LEVEL (UNFRACTIONATED): Heparin Unfractionated: 1.06 IU/mL — ABNORMAL HIGH (ref 0.30–0.70)

## 2020-02-19 LAB — APTT: aPTT: 74 seconds — ABNORMAL HIGH (ref 24–36)

## 2020-02-19 MED ORDER — CEFAZOLIN SODIUM-DEXTROSE 2-4 GM/100ML-% IV SOLN
2.0000 g | INTRAVENOUS | Status: AC
  Administered 2020-02-20: 2 g via INTRAVENOUS
  Filled 2020-02-19: qty 100

## 2020-02-19 NOTE — Progress Notes (Addendum)
  Progress Note    02/19/2020 7:39 AM Hospital Day 2  Subjective:  No complaints  Afebrile HR 40's-60's  056'P-794'I systolic 01% RA  Vitals:   02/19/20 0000 02/19/20 0522  BP: 114/61 (!) 109/58  Pulse: 63 (!) 58  Resp: 13 12  Temp:  98.3 F (36.8 C)  SpO2: 92% 94%    Physical Exam: general:  No distress Lungs:  Non labored Extremities:  Left hand warm with 5/5 hand grip  CBC    Component Value Date/Time   WBC 6.8 02/18/2020 0308   RBC 4.16 02/18/2020 0308   HGB 13.0 02/18/2020 0308   HCT 39.6 02/18/2020 0308   PLT 203 02/18/2020 0308   MCV 95.2 02/18/2020 0308   MCH 31.3 02/18/2020 0308   MCHC 32.8 02/18/2020 0308   RDW 13.1 02/18/2020 0308   LYMPHSABS 1.1 08/29/2016 0516   MONOABS 1.3 (H) 08/29/2016 0516   EOSABS 0.3 08/29/2016 0516   BASOSABS 0.0 08/29/2016 0516    BMET    Component Value Date/Time   NA 137 02/17/2020 1551   K 4.5 02/17/2020 1551   CL 99 02/17/2020 1551   CO2 25 02/17/2020 1551   GLUCOSE 86 02/17/2020 1551   BUN 14 02/17/2020 1551   CREATININE 0.94 02/17/2020 1551   CALCIUM 9.1 02/17/2020 1551   GFRNONAA 51 (L) 02/17/2020 1551   GFRAA 59 (L) 02/17/2020 1551    INR    Component Value Date/Time   INR 1.00 08/23/2016 0354     Intake/Output Summary (Last 24 hours) at 02/19/2020 0739 Last data filed at 02/19/2020 0533 Gross per 24 hour  Intake 844.87 ml  Output 3 ml  Net 841.87 ml     Assessment/Plan:  86 y.o. female with left brachial artery psa Hospital Day 2  -for repair of left brachial artery psa tomorrow -npo after MN/consent/labs ordered -dc heparin on call to [place], PA-C [facility] [phone] 02/19/2020 7:39 AM  Agree with above.  Repair left brachial pseudoaneurysm.  Potentially may need saphenous vein so will prep leg as well  Ruta Hinds, MD [facility] [place] Office: [phone]

## 2020-02-19 NOTE — Plan of Care (Signed)
Pt appeared alert, pleasant and oriented x 4.  Plan of care reviewed. Pt's been progressing.   Problem: Clinical Measurements: on room air, SPO2 92-93% at night. Goal: Respiratory complications will improve Outcome: Progressing, no respiratory distress, auscultated lungs clear bilaterally.   Problem: Clinical Measurements: NSR/ SB on monitor, HR 55-65, BP within normal limits, BP 114/61 - 126/65 mmHg. Goal: Cardiovascular complication will be avoided Outcome: Progressing. She's hemodynamically stable.    Problem: Clinical Measurements: remained afebrile. Goal: Will remain free from infection Outcome: Progressing   Problem: Pain Managment: Brachial artery aneurysm on left upper arm, denied pain. Plan for surgery on coming Tuesday by Dr. Oneida Alar. Goal: General experience of comfort will improve Outcome: Progressing, good and adequate capillary refilled on left arm, pulpated  good left radial and ulnar pulses. On Heparin gtt. She had small bleeding on her IV on right AC. Pressure dressing applied. Monitor aPTT and titrated Heparin per Pharmacist.  No immediate distress noted. We continue to monitor.  Kennyth Lose, RN

## 2020-02-19 NOTE — H&P (View-Only) (Signed)
  Progress Note    02/19/2020 7:39 AM Hospital Day 2  Subjective:  No complaints  Afebrile HR 40's-60's  811'X-726'O systolic 03% RA  Vitals:   02/19/20 0000 02/19/20 0522  BP: 114/61 (!) 109/58  Pulse: 63 (!) 58  Resp: 13 12  Temp:  98.3 F (36.8 C)  SpO2: 92% 94%    Physical Exam: general:  No distress Lungs:  Non labored Extremities:  Left hand warm with 5/5 hand grip  CBC    Component Value Date/Time   WBC 6.8 02/18/2020 0308   RBC 4.16 02/18/2020 0308   HGB 13.0 02/18/2020 0308   HCT 39.6 02/18/2020 0308   PLT 203 02/18/2020 0308   MCV 95.2 02/18/2020 0308   MCH 31.3 02/18/2020 0308   MCHC 32.8 02/18/2020 0308   RDW 13.1 02/18/2020 0308   LYMPHSABS 1.1 08/29/2016 0516   MONOABS 1.3 (H) 08/29/2016 0516   EOSABS 0.3 08/29/2016 0516   BASOSABS 0.0 08/29/2016 0516    BMET    Component Value Date/Time   NA 137 02/17/2020 1551   K 4.5 02/17/2020 1551   CL 99 02/17/2020 1551   CO2 25 02/17/2020 1551   GLUCOSE 86 02/17/2020 1551   BUN 14 02/17/2020 1551   CREATININE 0.94 02/17/2020 1551   CALCIUM 9.1 02/17/2020 1551   GFRNONAA 51 (L) 02/17/2020 1551   GFRAA 59 (L) 02/17/2020 1551    INR    Component Value Date/Time   INR 1.00 08/23/2016 0354     Intake/Output Summary (Last 24 hours) at 02/19/2020 0739 Last data filed at 02/19/2020 0533 Gross per 24 hour  Intake 844.87 ml  Output 3 ml  Net 841.87 ml     Assessment/Plan:  86 y.o. female with left brachial artery psa Hospital Day 2  -for repair of left brachial artery psa tomorrow -npo after MN/consent/labs ordered -dc heparin on call to [place], PA-C [facility] [phone] 02/19/2020 7:39 AM  Agree with above.  Repair left brachial pseudoaneurysm.  Potentially may need saphenous vein so will prep leg as well  Ruta Hinds, MD [facility] [place] Office: [phone]

## 2020-02-19 NOTE — Progress Notes (Signed)
ANTICOAGULATION CONSULT NOTE   Pharmacy Consult for Heparin Indication: atrial fibrillation   No Known Allergies  Patient Measurements: Height: 5\' 3"  (160 cm) Weight: 63 kg (139 lb) IBW/kg (Calculated) : 52.4 Heparin Dosing Weight: 63  Vital Signs: Temp: 98.3 F (36.8 C) (07/19 0806) Temp Source: Oral (07/19 0806) BP: 131/68 (07/19 0806) Pulse Rate: 56 (07/19 0806)  Labs: Recent Labs    02/17/20 1551 02/17/20 1551 02/18/20 0308 02/18/20 0308 02/18/20 1159 02/18/20 2140 02/19/20 0737  HGB 14.0   < > 13.0  --   --   --  13.2  HCT 43.4  --  39.6  --   --   --  40.8  PLT 195  --  203  --   --   --  201  APTT  --   --  86*   < > 116* 124* 74*  HEPARINUNFRC  --   --  >2.20*  --   --   --  1.06*  CREATININE 0.94  --   --   --   --   --   --    < > = values in this interval not displayed.    Estimated Creatinine Clearance: 31.3 mL/min (by C-G formula based on SCr of 0.94 mg/dL).   Medical History: Past Medical History:  Diagnosis Date  . Aortic insufficiency    Mild  . Aortic stenosis    Moderate to severe  . 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  s/p Left brachial, radial, ulnar, axillary embolectomy 08/2016.  Marland Kitchen Atypical atrial flutter ([place])   . Embolic stroke involving right cerebellar artery ([place])   . Essential hypertension   . Hypothyroidism   . Ischemia of extremity   . Memory loss 11/26/2016  . Moderate to severe aortic stenosis   . PAF (paroxysmal atrial fibrillation) ([place])   . Paroxysmal atrial tachycardia ([place])   . Stroke ([place]) 08/2016  . Syncope    a. s/p ILR 01/2016.    Medications:  Medications Prior to Admission  Medication Sig Dispense Refill Last Dose  . amiodarone (PACERONE) 200 MG tablet TAKE 1/2 TABLET BY MOUTH DAILY 45 tablet 1 02/17/2020 at Unknown time  . ELIQUIS 5 MG TABS tablet TAKE 1 TABLET BY MOUTH TWICE A DAY 180 tablet 2 02/17/2020 at Unknown time  . levothyroxine (SYNTHROID, LEVOTHROID) 112 MCG tablet Take 1  tablet (112 mcg total) by mouth daily before breakfast. 30 tablet 0 02/17/2020 at Unknown time  . Lutein-Zeaxanthin 25-5 MG CAPS Take 1 capsule by mouth 2 (two) times daily.   02/17/2020 at Unknown time  . metoprolol succinate (TOPROL-XL) 25 MG 24 hr tablet TAKE 0.5 TABLETS (12.5 MG TOTAL) BY MOUTH 2 (TWO) TIMES DAILY. 180 tablet 1 02/17/2020 at Unknown time  . Multiple Vitamin (MULTIVITAMIN) tablet Take 1 tablet by mouth daily.     02/17/2020 at Unknown time  . triamcinolone cream (KENALOG) 0.1 % Apply 1 application topically daily as needed (irritated skin).    Past Month at Unknown time  . triamterene-hydrochlorothiazide (DYAZIDE) 37.5-25 MG capsule Take 1 each (1 capsule total) by mouth daily. 30 capsule 0 02/17/2020 at Unknown time   Assessment: 86 yo female with brachial artery aneurysm or pseudoaneurysm. Pharmacy consulted to bridge from apixaban to heparin. Plan to repair pseudoaneurysm in OR on 7/20.  Last dose of apixaban 7/17 at 0900.  aPTT and HL still do not correlate.   aPTT is now therapeutic at 74 on 650 units/hr  of IV Heparin.  Current plan is to discontinue IV Heparin on-call to OR on 7/20 per Vascular Surgery note today.   Goal of Therapy:  aPTT 66-102 seconds (HL 0.3-0.7) Monitor platelets by anticoagulation protocol: Yes  Plan:  - Continue heparin infusion at 650 units/hr - Daily aPTT and heparin level  and monitor for correlation. - Continue to monitor H&H and platelets - Heparin to stop on-call to OR 7/20 per Vascular Surgery request  - F/up anticoagulation plans 7/20 post OR  Sloan Leiter, PharmD, BCPS, BCCCP Clinical Pharmacist Please refer to [facility] for [place] numbers 02/19/2020, 9:24 AM

## 2020-02-20 ENCOUNTER — Encounter (HOSPITAL_COMMUNITY): Payer: Self-pay | Admitting: Vascular Surgery

## 2020-02-20 ENCOUNTER — Inpatient Hospital Stay (HOSPITAL_COMMUNITY): Payer: Medicare Other | Admitting: Certified Registered Nurse Anesthetist

## 2020-02-20 ENCOUNTER — Encounter (HOSPITAL_COMMUNITY)
Admission: EM | Disposition: A | Discharge status: Skilled Nursing Facility | Payer: Self-pay | Source: Home / Self Care | Attending: Vascular Surgery

## 2020-02-20 HISTORY — PX: ARTERY REPAIR: SHX559

## 2020-02-20 HISTORY — PX: VEIN HARVEST: SHX6363

## 2020-02-20 LAB — CBC
HCT: 39.7 % (ref 36.0–46.0)
Hemoglobin: 13 g/dL (ref 12.0–15.0)
MCH: 31.9 pg (ref 26.0–34.0)
MCHC: 32.7 g/dL (ref 30.0–36.0)
MCV: 97.3 fL (ref 80.0–100.0)
Platelets: 215 10*3/uL (ref 150–400)
RBC: 4.08 MIL/uL (ref 3.87–5.11)
RDW: 13.2 % (ref 11.5–15.5)
WBC: 6.9 10*3/uL (ref 4.0–10.5)
nRBC: 0 % (ref 0.0–0.2)

## 2020-02-20 LAB — POCT I-STAT 7, (LYTES, BLD GAS, ICA,H+H)
Acid-Base Excess: 3 mmol/L — ABNORMAL HIGH (ref 0.0–2.0)
Acid-Base Excess: 3 mmol/L — ABNORMAL HIGH (ref 0.0–2.0)
Bicarbonate: 27.4 mmol/L (ref 20.0–28.0)
Bicarbonate: 26.9 mmol/L (ref 20.0–28.0)
Calcium, Ion: 1.14 mmol/L — ABNORMAL LOW (ref 1.15–1.40)
Calcium, Ion: 1.11 mmol/L — ABNORMAL LOW (ref 1.15–1.40)
HCT: 32 % — ABNORMAL LOW (ref 36.0–46.0)
HCT: 31 % — ABNORMAL LOW (ref 36.0–46.0)
Hemoglobin: 10.9 g/dL — ABNORMAL LOW (ref 12.0–15.0)
Hemoglobin: 10.5 g/dL — ABNORMAL LOW (ref 12.0–15.0)
O2 Saturation: 100 %
O2 Saturation: 100 %
Patient temperature: 36.3
Patient temperature: 36.5
Potassium: 3.9 mmol/L (ref 3.5–5.1)
Potassium: 3.9 mmol/L (ref 3.5–5.1)
Sodium: 138 mmol/L (ref 135–145)
Sodium: 139 mmol/L (ref 135–145)
TCO2: 29 mmol/L (ref 22–32)
TCO2: 28 mmol/L (ref 22–32)
pCO2 arterial: 37.7 mmHg (ref 32.0–48.0)
pCO2 arterial: 36.5 mmHg (ref 32.0–48.0)
pH, Arterial: 7.467 — ABNORMAL HIGH (ref 7.350–7.450)
pH, Arterial: 7.473 — ABNORMAL HIGH (ref 7.350–7.450)
pO2, Arterial: 236 mmHg — ABNORMAL HIGH (ref 83.0–108.0)
pO2, Arterial: 257 mmHg — ABNORMAL HIGH (ref 83.0–108.0)

## 2020-02-20 LAB — ABO/RH: ABO/RH(D): O POS

## 2020-02-20 LAB — PROTIME-INR
INR: 1.1 (ref 0.8–1.2)
Prothrombin Time: 13.7 seconds (ref 11.4–15.2)

## 2020-02-20 LAB — BASIC METABOLIC PANEL
Anion gap: 8 (ref 5–15)
BUN: 14 mg/dL (ref 8–23)
CO2: 26 mmol/L (ref 22–32)
Calcium: 8.9 mg/dL (ref 8.9–10.3)
Chloride: 105 mmol/L (ref 98–111)
Creatinine, Ser: 1.04 mg/dL — ABNORMAL HIGH (ref 0.44–1.00)
GFR calc Af Amer: 53 mL/min — ABNORMAL LOW (ref >60)
GFR calc non Af Amer: 45 mL/min — ABNORMAL LOW (ref >60)
Glucose, Bld: 104 mg/dL — ABNORMAL HIGH (ref 70–99)
Potassium: 3.6 mmol/L (ref 3.5–5.1)
Sodium: 139 mmol/L (ref 135–145)

## 2020-02-20 LAB — APTT: aPTT: 64 seconds — ABNORMAL HIGH (ref 24–36)

## 2020-02-20 LAB — HEPARIN LEVEL (UNFRACTIONATED): Heparin Unfractionated: 0.53 IU/mL (ref 0.30–0.70)

## 2020-02-20 LAB — PREPARE RBC (CROSSMATCH)

## 2020-02-20 SURGERY — REPAIR, ARTERY, BRACHIAL
Anesthesia: General | Site: Leg Upper | Laterality: Right

## 2020-02-20 MED ORDER — ROCURONIUM BROMIDE 10 MG/ML (PF) SYRINGE
PREFILLED_SYRINGE | INTRAVENOUS | Status: AC
  Filled 2020-02-20: qty 10

## 2020-02-20 MED ORDER — HEMOSTATIC AGENTS (NO CHARGE) OPTIME
TOPICAL | Status: DC | PRN
  Administered 2020-02-20 (×2): 1 via TOPICAL

## 2020-02-20 MED ORDER — CHLORHEXIDINE GLUCONATE 0.12 % MT SOLN
OROMUCOSAL | Status: AC
  Filled 2020-02-20: qty 15

## 2020-02-20 MED ORDER — PHENYLEPHRINE HCL-NACL 10-0.9 MG/250ML-% IV SOLN
INTRAVENOUS | Status: DC | PRN
  Administered 2020-02-20: 35 ug/min via INTRAVENOUS

## 2020-02-20 MED ORDER — PHENYLEPHRINE 40 MCG/ML (10ML) SYRINGE FOR IV PUSH (FOR BLOOD PRESSURE SUPPORT)
PREFILLED_SYRINGE | INTRAVENOUS | Status: AC
  Filled 2020-02-20: qty 10

## 2020-02-20 MED ORDER — EPHEDRINE SULFATE 50 MG/ML IJ SOLN
INTRAMUSCULAR | Status: DC | PRN
Start: 2020-02-20 — End: 2020-02-20
  Administered 2020-02-20: 2.5 mg via INTRAVENOUS
  Administered 2020-02-20: 5 mg via INTRAVENOUS

## 2020-02-20 MED ORDER — SODIUM CHLORIDE 0.9 % IV SOLN
INTRAVENOUS | Status: AC
  Filled 2020-02-20: qty 1.2

## 2020-02-20 MED ORDER — HEPARIN SODIUM (PORCINE) 1000 UNIT/ML IJ SOLN
INTRAMUSCULAR | Status: AC
  Filled 2020-02-20: qty 1

## 2020-02-20 MED ORDER — ALBUMIN HUMAN 5 % IV SOLN: INTRAVENOUS | Status: DC | PRN

## 2020-02-20 MED ORDER — PROPOFOL 10 MG/ML IV BOLUS
INTRAVENOUS | Status: DC | PRN
  Administered 2020-02-20: 50 mg via INTRAVENOUS

## 2020-02-20 MED ORDER — DEXAMETHASONE SODIUM PHOSPHATE 10 MG/ML IJ SOLN
INTRAMUSCULAR | Status: DC | PRN
  Administered 2020-02-20: 4 mg via INTRAVENOUS

## 2020-02-20 MED ORDER — PROPOFOL 10 MG/ML IV BOLUS
INTRAVENOUS | Status: AC
  Filled 2020-02-20: qty 20

## 2020-02-20 MED ORDER — LACTATED RINGERS IV SOLN: INTRAVENOUS | Status: DC | PRN

## 2020-02-20 MED ORDER — ACETAMINOPHEN 500 MG PO TABS: 1000.0000 mg | ORAL_TABLET | Freq: Once | ORAL | Status: AC

## 2020-02-20 MED ORDER — SCOPOLAMINE 1 MG/3DAYS TD PT72
MEDICATED_PATCH | TRANSDERMAL | Status: AC
  Filled 2020-02-20: qty 1

## 2020-02-20 MED ORDER — PROTAMINE SULFATE 10 MG/ML IV SOLN
INTRAVENOUS | Status: DC | PRN
  Administered 2020-02-20 (×2): 50 mg via INTRAVENOUS

## 2020-02-20 MED ORDER — SODIUM CHLORIDE 0.9% IV SOLUTION: Freq: Once | INTRAVENOUS | Status: DC

## 2020-02-20 MED ORDER — FENTANYL CITRATE (PF) 250 MCG/5ML IJ SOLN
INTRAMUSCULAR | Status: AC
  Filled 2020-02-20: qty 5

## 2020-02-20 MED ORDER — ONDANSETRON HCL 4 MG/2ML IJ SOLN
INTRAMUSCULAR | Status: DC | PRN
  Administered 2020-02-20: 4 mg via INTRAVENOUS

## 2020-02-20 MED ORDER — EPHEDRINE 5 MG/ML INJ
INTRAVENOUS | Status: AC
  Filled 2020-02-20: qty 10

## 2020-02-20 MED ORDER — LIDOCAINE HCL (PF) 1 % IJ SOLN
INTRAMUSCULAR | Status: AC
  Filled 2020-02-20: qty 30

## 2020-02-20 MED ORDER — ROCURONIUM BROMIDE 10 MG/ML (PF) SYRINGE
PREFILLED_SYRINGE | INTRAVENOUS | Status: DC | PRN
  Administered 2020-02-20: 10 mg via INTRAVENOUS
  Administered 2020-02-20: 20 mg via INTRAVENOUS
  Administered 2020-02-20: 40 mg via INTRAVENOUS

## 2020-02-20 MED ORDER — SODIUM CHLORIDE 0.9 % IV SOLN
INTRAVENOUS | Status: DC | PRN
  Administered 2020-02-20: 500 mL

## 2020-02-20 MED ORDER — SUGAMMADEX SODIUM 200 MG/2ML IV SOLN
INTRAVENOUS | Status: DC | PRN
  Administered 2020-02-20: 150 mg via INTRAVENOUS

## 2020-02-20 MED ORDER — ONDANSETRON HCL 4 MG/2ML IJ SOLN
INTRAMUSCULAR | Status: AC
  Filled 2020-02-20: qty 2

## 2020-02-20 MED ORDER — PHENYLEPHRINE HCL (PRESSORS) 10 MG/ML IV SOLN
INTRAVENOUS | Status: DC | PRN
  Administered 2020-02-20: 80 ug via INTRAVENOUS

## 2020-02-20 MED ORDER — SODIUM CHLORIDE 0.9 % IR SOLN
Status: DC | PRN
  Administered 2020-02-20: 1000 mL

## 2020-02-20 MED ORDER — LIDOCAINE 2% (20 MG/ML) 5 ML SYRINGE
INTRAMUSCULAR | Status: DC | PRN
  Administered 2020-02-20: 60 mg via INTRAVENOUS

## 2020-02-20 MED ORDER — PROTAMINE SULFATE 10 MG/ML IV SOLN
INTRAVENOUS | Status: AC
  Filled 2020-02-20: qty 10

## 2020-02-20 MED ORDER — FENTANYL CITRATE (PF) 100 MCG/2ML IJ SOLN
INTRAMUSCULAR | Status: DC | PRN
  Administered 2020-02-20: 25 ug via INTRAVENOUS
  Administered 2020-02-20: 50 ug via INTRAVENOUS
  Administered 2020-02-20: 25 ug via INTRAVENOUS
  Administered 2020-02-20: 50 ug via INTRAVENOUS
  Administered 2020-02-20: 25 ug via INTRAVENOUS

## 2020-02-20 MED ORDER — HEPARIN SODIUM (PORCINE) 1000 UNIT/ML IJ SOLN
INTRAMUSCULAR | Status: DC | PRN
Start: 2020-02-20 — End: 2020-02-20
  Administered 2020-02-20: 2500 [IU] via INTRAVENOUS
  Administered 2020-02-20 (×2): 5000 [IU] via INTRAVENOUS

## 2020-02-20 MED ORDER — ACETAMINOPHEN 500 MG PO TABS
ORAL_TABLET | ORAL | Status: AC
  Administered 2020-02-20: 1000 mg via ORAL
  Filled 2020-02-20: qty 2

## 2020-02-20 MED ORDER — LIDOCAINE 2% (20 MG/ML) 5 ML SYRINGE
INTRAMUSCULAR | Status: AC
  Filled 2020-02-20: qty 5

## 2020-02-20 SURGICAL SUPPLY — 67 items
ADH SKN CLS APL DERMABOND .7 (GAUZE/BANDAGES/DRESSINGS) ×2
AGENT HMST SPONGE THK3/8 (HEMOSTASIS) ×2
BNDG CMPR 9X4 STRL LF SNTH (GAUZE/BANDAGES/DRESSINGS) ×2
BNDG ELASTIC 4X5.8 VLCR STR LF (GAUZE/BANDAGES/DRESSINGS) ×1 IMPLANT
BNDG ESMARK 4X9 LF (GAUZE/BANDAGES/DRESSINGS) ×1 IMPLANT
CANISTER SUCT 3000ML PPV (MISCELLANEOUS) ×3 IMPLANT
CANNULA VESSEL 3MM 2 BLNT TIP (CANNULA) ×2 IMPLANT
CLIP VESOCCLUDE MED 24/CT (CLIP) ×1 IMPLANT
CLIP VESOCCLUDE SM WIDE 24/CT (CLIP) ×1 IMPLANT
CUFF TOURN SGL QUICK 18X4 (TOURNIQUET CUFF) ×1 IMPLANT
CUFF TOURN SGL QUICK 24 (TOURNIQUET CUFF)
CUFF TOURN SGL QUICK 34 (TOURNIQUET CUFF)
CUFF TRNQT CYL 24X4X16.5-23 (TOURNIQUET CUFF) IMPLANT
CUFF TRNQT CYL 34X4.125X (TOURNIQUET CUFF) IMPLANT
DECANTER SPIKE VIAL GLASS SM (MISCELLANEOUS) IMPLANT
DERMABOND ADVANCED (GAUZE/BANDAGES/DRESSINGS) ×1
DERMABOND ADVANCED .7 DNX12 (GAUZE/BANDAGES/DRESSINGS) IMPLANT
DRAPE IMP U-DRAPE 54X76 (DRAPES) ×1 IMPLANT
DRAPE ORTHO SPLIT 77X108 STRL (DRAPES) ×3
DRAPE SURG ORHT 6 SPLT 77X108 (DRAPES) IMPLANT
DRSG COVADERM 4X6 (GAUZE/BANDAGES/DRESSINGS) ×1 IMPLANT
DRSG COVADERM 4X8 (GAUZE/BANDAGES/DRESSINGS) ×1 IMPLANT
ELECT REM PT RETURN 9FT ADLT (ELECTROSURGICAL) ×3
ELECTRODE REM PT RTRN 9FT ADLT (ELECTROSURGICAL) ×2 IMPLANT
GAUZE 4X4 16PLY RFD (DISPOSABLE) IMPLANT
GAUZE SPONGE 4X4 12PLY STRL (GAUZE/BANDAGES/DRESSINGS) ×2 IMPLANT
GLOVE BIO SURGEON STRL SZ 6.5 (GLOVE) ×1 IMPLANT
GLOVE BIO SURGEON STRL SZ7 (GLOVE) ×1 IMPLANT
GLOVE BIO SURGEON STRL SZ7.5 (GLOVE) ×4 IMPLANT
GLOVE BIOGEL PI IND STRL 6.5 (GLOVE) IMPLANT
GLOVE BIOGEL PI IND STRL 8 (GLOVE) IMPLANT
GLOVE BIOGEL PI INDICATOR 6.5 (GLOVE) ×2
GLOVE BIOGEL PI INDICATOR 8 (GLOVE) ×2
GLOVE INDICATOR 7.0 STRL GRN (GLOVE) ×3 IMPLANT
GLOVE SURG SYN 8.0 (GLOVE) ×9 IMPLANT
GLOVE SURG SYN 8.0 PF PI (GLOVE) IMPLANT
GOWN STRL REUS W/ TWL LRG LVL3 (GOWN DISPOSABLE) ×6 IMPLANT
GOWN STRL REUS W/ TWL XL LVL3 (GOWN DISPOSABLE) IMPLANT
GOWN STRL REUS W/TWL 2XL LVL3 (GOWN DISPOSABLE) ×3 IMPLANT
GOWN STRL REUS W/TWL LRG LVL3 (GOWN DISPOSABLE) ×9
GOWN STRL REUS W/TWL XL LVL3 (GOWN DISPOSABLE) ×3
HEMOSTAT ARISTA ABSORB 3G PWDR (HEMOSTASIS) ×1 IMPLANT
HEMOSTAT SPONGE AVITENE ULTRA (HEMOSTASIS) ×1 IMPLANT
KIT BASIN OR (CUSTOM PROCEDURE TRAY) ×3 IMPLANT
KIT TURNOVER KIT B (KITS) ×3 IMPLANT
LOOP VESSEL MINI RED (MISCELLANEOUS) ×1 IMPLANT
NS IRRIG 1000ML POUR BTL (IV SOLUTION) ×3 IMPLANT
PACK PERIPHERAL VASCULAR (CUSTOM PROCEDURE TRAY) ×1 IMPLANT
PAD ARMBOARD 7.5X6 YLW CONV (MISCELLANEOUS) ×6 IMPLANT
PAD CAST 4YDX4 CTTN HI CHSV (CAST SUPPLIES) IMPLANT
PADDING CAST COTTON 4X4 STRL (CAST SUPPLIES)
SPONGE LAP 18X18 RF (DISPOSABLE) ×2 IMPLANT
SPONGE SURGIFOAM ABS GEL 100 (HEMOSTASIS) IMPLANT
STAPLER VISISTAT 35W (STAPLE) ×1 IMPLANT
STOCKINETTE 6  STRL (DRAPES) ×3
STOCKINETTE 6 STRL (DRAPES) IMPLANT
STOCKINETTE IMPERVIOUS 9X36 MD (GAUZE/BANDAGES/DRESSINGS) ×2 IMPLANT
STRIP CLOSURE SKIN 1/2X4 (GAUZE/BANDAGES/DRESSINGS) ×3 IMPLANT
SUT PROLENE 5 0 C 1 24 (SUTURE) ×2 IMPLANT
SUT PROLENE 6 0 CC (SUTURE) ×13 IMPLANT
SUT PROLENE 7 0 BV 1 (SUTURE) ×3 IMPLANT
SUT VIC AB 3-0 SH 27 (SUTURE) ×9
SUT VIC AB 3-0 SH 27X BRD (SUTURE) ×2 IMPLANT
SUT VICRYL 4-0 PS2 18IN ABS (SUTURE) ×3 IMPLANT
TOWEL GREEN STERILE (TOWEL DISPOSABLE) ×5 IMPLANT
UNDERPAD 30X36 HEAVY ABSORB (UNDERPADS AND DIAPERS) ×3 IMPLANT
WATER STERILE IRR 1000ML POUR (IV SOLUTION) ×3 IMPLANT

## 2020-02-20 NOTE — Progress Notes (Signed)
Report called to Marlowe Kays, RN in short stay. Heparin drip stopped.

## 2020-02-20 NOTE — Anesthesia Postprocedure Evaluation (Signed)
Anesthesia Post Note  Patient: Christine Pugh  Procedure(s) Performed: LEFT BRACHIAL ARTERY PSEUDOANEURYSM REPAIR WITH LEFT BRACHIAL TO ULNAR BYPASS USING VEIN (Left ) RIGHT LEG GREATER SAPHENOUS VEIN HARVEST (Right Leg Upper)     Patient location during evaluation: PACU Anesthesia Type: General Level of consciousness: awake and alert Pain management: pain level controlled Vital Signs Assessment: post-procedure vital signs reviewed and stable Respiratory status: spontaneous breathing, nonlabored ventilation and respiratory function stable Cardiovascular status: blood pressure returned to baseline and stable Postop Assessment: no apparent nausea or vomiting Anesthetic complications: no   No complications documented.  Last Vitals:  Vitals:   02/20/20 1715 02/20/20 1730  BP: (!) 106/59 (!) 96/51  Pulse: 67 69  Resp: 13 16  Temp:    SpO2: 94% 96%    Last Pain:  Vitals:   02/20/20 1700  TempSrc:   PainSc: (P) 0-No pain                 Nike Southwell,W. EDMOND

## 2020-02-20 NOTE — Interval H&P Note (Signed)
History and Physical Interval Note:  02/20/2020 9:37 AM  Christine Pugh  has presented today for surgery, with the diagnosis of BRIACHIAL ANEURYSM.  The various methods of treatment have been discussed with the patient and family. After consideration of risks, benefits and other options for treatment, the patient has consented to  Procedure(s): [place] (Left) as a surgical intervention.  The patient's history has been reviewed, patient examined, no change in status, stable for surgery.  I have reviewed the patient's chart and labs.  Questions were answered to the patient's satisfaction.     Ruta Hinds

## 2020-02-20 NOTE — Anesthesia Preprocedure Evaluation (Addendum)
Anesthesia Evaluation  Patient identified by MRN, date of birth, ID band Patient awake    Reviewed: Allergy & Precautions, H&P , NPO status , Patient's Chart, lab work & pertinent test results  Airway Mallampati: II  TM Distance: >3 FB Neck ROM: Full    Dental no notable dental hx. (+) Edentulous Upper, Edentulous Lower, Dental Advisory Given   Pulmonary neg pulmonary ROS,    Pulmonary exam normal breath sounds clear to auscultation       Cardiovascular hypertension, Pt. on medications and Pt. on home beta blockers + Peripheral Vascular Disease  + dysrhythmias Atrial Fibrillation + Valvular Problems/Murmurs AS  Rhythm:Regular Rate:Normal + Systolic murmurs    Neuro/Psych CVA negative psych ROS   GI/Hepatic negative GI ROS, Neg liver ROS,   Endo/Other  Hypothyroidism   Renal/GU negative Renal ROS  negative genitourinary   Musculoskeletal   Abdominal   Peds  Hematology negative hematology ROS (+)   Anesthesia Other Findings   Reproductive/Obstetrics negative OB ROS                            Anesthesia Physical Anesthesia Plan  ASA: IV  Anesthesia Plan: General   Post-op Pain Management:    Induction: Intravenous  PONV Risk Score and Plan: 4 or greater and Ondansetron, Dexamethasone and Treatment may vary due to age or medical condition  Airway Management Planned: Oral ETT  Additional Equipment: Arterial line  Intra-op Plan:   Post-operative Plan: Extubation in OR  Informed Consent: I have reviewed the patients History and Physical, chart, labs and discussed the procedure including the risks, benefits and alternatives for the proposed anesthesia with the patient or authorized representative who has indicated his/her understanding and acceptance.   Patient has DNR.  Discussed DNR with patient and Continue DNR.   Dental advisory given  Plan Discussed with: CRNA  Anesthesia  Plan Comments:       Anesthesia Quick Evaluation

## 2020-02-20 NOTE — Anesthesia Procedure Notes (Signed)
Arterial Line Insertion Start/End7/20/2021 10:04 AM, 02/20/2020 10:04 AM Performed by: CRNA  Patient location: Pre-op. Preanesthetic checklist: patient identified, IV checked, site marked, risks and benefits discussed, surgical consent, monitors and equipment checked, pre-op evaluation, timeout performed and anesthesia consent Lidocaine 1% used for infiltration Right, radial was placed Catheter size: 20 G Hand hygiene performed , maximum sterile barriers used  and Seldinger technique used Allen's test indicative of satisfactory collateral circulation Attempts: 1 Procedure performed without using ultrasound guided technique. Following insertion, dressing applied and Biopatch. Post procedure assessment: normal  Patient tolerated the procedure well with no immediate complications.

## 2020-02-20 NOTE — Plan of Care (Signed)
Continue to monitor

## 2020-02-20 NOTE — Op Note (Signed)
Procedure: Left brachial to ulnar artery bypass with reversed right greater saphenous vein  Preoperative diagnosis: Brachial artery pseudoaneurysm  Postoperative diagnosis: Brachial artery transection  Assistant: Arlee Muslim, PA-C was necessary for obtaining exposure of the artery creation of the anastomosis and expediting the procedure  Operative findings: High brachial bifurcation pseudoaneurysm cavity involving essentially transection of the brachial artery at the takeoff of ulnar and radial arteries. Reconstructed with a vein Graft to what appeared to be the ulnar artery.  Operative details: After pain informed consent, the patient was taken the operating. The patient was placed in supine position operating table. After induction general anesthesia and endotracheal intubation patient's left upper extremity and right leg were prepped and draped in usual sterile fashion. A tourniquet was placed on the left upper extremity in the axilla. The arm was exsanguinated and the tourniquet inflated to 300 mmHg. A longitudinal incision was then made over the area of the pseudoaneurysm. It was carried down through the subcutaneous tissues and the pseudoaneurysm cavity was entered. There was a large amount of thrombus which was removed. It was difficult to determine the anatomy as there were multiple lumens present on deflation of the tourniquet. Dissection was fairly tedious due to a large amount of scar in the area. I was able to control 1 distal arterial branch and place a loop around this. Due to the location of the bleeding the tourniquet was not adequate to provide enough room for proximal exposure so this was removed and hemostasis temporarily obtained with direct pressure. I was then able to dissect out the more proximal brachial artery above the area of injury. A vessel loop was placed around this. There were several areas of venous bleeding encountered due to adherent veins. I then was able to dissect up  the previously found a branch in the main brachial artery to get to the area of the pseudoaneurysm cavity. Was finally able to determine the anatomy that basically the patient had a complete transection of the brachial artery with a large branch going deep and medial which potentially could have been the ulnar or radial artery. There was another branch that connected to the one I had found in the lower arm. This was presumably the ulnar artery. At this point I decided to reconstruct the artery. Ultrasound was used to identify the right greater saphenous vein in the right leg. Longitude incision was made in this location carried on through subcutaneous tissues down the level right greater saphenous vein. The vein was dissected free circumferentially. Small side branches were ligated divided tween silk ties. The vein was then ligated proximally distally with a 2-0 silk tie and transected. Was placed in reverse configuration and flushed thoroughly. It was dilated up. It was about 3 mm in diameter. The proximal brachial artery was about 4 mm in diameter. The distal what was thought to be the ulnar artery was about 2 mm in diameter. The artery was spatulated on the proximal and distal aspects of the vein placed in reverse configuration and spatulated and sewn end-to-end to the proximal brachial artery as well as what was thought to be the ulnar artery distally. At completion of both anastomoses it was for blood backbled and thoroughly flushed anastomosis was secured clamps released a couple repair stitches were placed at the distal anastomosis. Patient immediately regained a radial pulse. I considered plugging in the other branch to the side of the vein graft however due to the patient's age and length of operation and the fact  that she had a restored radial pulse I decided to ligate this. After this hemostasis was obtained with direct pressure some cautery and administration of 100 mg of protamine. The patient had been  given a total of 12,000 units of heparin during the case. After hemostasis was obtained the subcutaneous tissues of the groin incision was closed with a running 3-0 Vicryl suture. The skin was closed with a 4-0 Vicryl subcuticular stitch. Dermabond was applied. The arm incision was closed with a running 3-0 Vicryl suture in multiple layers followed by 4-0 Vicryl subcuticular stitch in the skin. The patient still had a palpable radial pulse the end of the case. Dry sterile dressing was applied. Patient was taken the recovery room in stable condition.  Ruta Hinds, MD [facility] [place] Office: [phone]

## 2020-02-20 NOTE — Transfer of Care (Signed)
Immediate Anesthesia Transfer of Care Note  Patient: Christine Pugh  Procedure(s) Performed: LEFT BRACHIAL ARTERY PSEUDOANEURYSM REPAIR WITH LEFT BRACHIAL TO ULNAR BYPASS USING VEIN (Left ) RIGHT LEG GREATER SAPHENOUS VEIN HARVEST (Right Leg Upper)  Patient Location: PACU  Anesthesia Type:General  Level of Consciousness: awake and alert   Airway & Oxygen Therapy: Patient Spontanous Breathing and Patient connected to face mask oxygen  Post-op Assessment: Report given to RN, Post -op Vital signs reviewed and stable and Patient moving all extremities X 4  Post vital signs: Reviewed and stable  Last Vitals:  Vitals Value Taken Time  BP 102/55 02/20/20 1552  Temp    Pulse 66 02/20/20 1556  Resp 16 02/20/20 1556  SpO2 93 % 02/20/20 1556  Vitals shown include unvalidated device data.  Last Pain:  Vitals:   02/20/20 0800  TempSrc:   PainSc: 0-No pain         Complications: No complications documented.

## 2020-02-20 NOTE — Anesthesia Procedure Notes (Signed)
Procedure Name: Intubation Date/Time: 02/20/2020 10:15 AM Performed by: Inda Coke, CRNA Pre-anesthesia Checklist: Patient identified, Emergency Drugs available, Suction available and Patient being monitored Patient Re-evaluated:Patient Re-evaluated prior to induction Oxygen Delivery Method: Circle System Utilized Preoxygenation: Pre-oxygenation with 100% oxygen Induction Type: IV induction Ventilation: Mask ventilation without difficulty and Oral airway inserted - appropriate to patient size Laryngoscope Size: Mac and 3 Grade View: Grade I Tube type: Oral Tube size: 7.0 mm Number of attempts: 1 Airway Equipment and Method: Stylet and Oral airway Placement Confirmation: ETT inserted through vocal cords under direct vision,  positive ETCO2 and breath sounds checked- equal and bilateral Secured at: 20 cm Tube secured with: Tape Dental Injury: Teeth and Oropharynx as per pre-operative assessment

## 2020-02-21 ENCOUNTER — Encounter (INDEPENDENT_AMBULATORY_CARE_PROVIDER_SITE_OTHER): Payer: Medicare Other | Admitting: Ophthalmology

## 2020-02-21 ENCOUNTER — Encounter (HOSPITAL_COMMUNITY): Payer: Self-pay | Admitting: Vascular Surgery

## 2020-02-21 LAB — BASIC METABOLIC PANEL
Anion gap: 9 (ref 5–15)
BUN: 13 mg/dL (ref 8–23)
CO2: 24 mmol/L (ref 22–32)
Calcium: 8 mg/dL — ABNORMAL LOW (ref 8.9–10.3)
Chloride: 104 mmol/L (ref 98–111)
Creatinine, Ser: 1.05 mg/dL — ABNORMAL HIGH (ref 0.44–1.00)
GFR calc Af Amer: 52 mL/min — ABNORMAL LOW (ref >60)
GFR calc non Af Amer: 45 mL/min — ABNORMAL LOW (ref >60)
Glucose, Bld: 135 mg/dL — ABNORMAL HIGH (ref 70–99)
Potassium: 4.2 mmol/L (ref 3.5–5.1)
Sodium: 137 mmol/L (ref 135–145)

## 2020-02-21 LAB — HEPARIN LEVEL (UNFRACTIONATED): Heparin Unfractionated: 0.26 IU/mL — ABNORMAL LOW (ref 0.30–0.70)

## 2020-02-21 LAB — APTT: aPTT: 60 seconds — ABNORMAL HIGH (ref 24–36)

## 2020-02-21 LAB — CBC
HCT: 27.7 % — ABNORMAL LOW (ref 36.0–46.0)
Hemoglobin: 9.2 g/dL — ABNORMAL LOW (ref 12.0–15.0)
MCH: 32.3 pg (ref 26.0–34.0)
MCHC: 33.2 g/dL (ref 30.0–36.0)
MCV: 97.2 fL (ref 80.0–100.0)
Platelets: 173 10*3/uL (ref 150–400)
RBC: 2.85 MIL/uL — ABNORMAL LOW (ref 3.87–5.11)
RDW: 13.2 % (ref 11.5–15.5)
WBC: 10.7 10*3/uL — ABNORMAL HIGH (ref 4.0–10.5)
nRBC: 0 % (ref 0.0–0.2)

## 2020-02-21 MED ORDER — MAGNESIUM SULFATE 2 GM/50ML IV SOLN: 2.0000 g | Freq: Every day | INTRAVENOUS | Status: DC | PRN

## 2020-02-21 MED ORDER — HEPARIN (PORCINE) 25000 UT/250ML-% IV SOLN
700.0000 [IU]/h | INTRAVENOUS | Status: AC
  Administered 2020-02-21: 650 [IU]/h via INTRAVENOUS
  Administered 2020-02-23: 700 [IU]/h via INTRAVENOUS
  Filled 2020-02-21 (×3): qty 250

## 2020-02-21 MED ORDER — CEFAZOLIN SODIUM-DEXTROSE 2-4 GM/100ML-% IV SOLN
2.0000 g | Freq: Three times a day (TID) | INTRAVENOUS | Status: AC
  Administered 2020-02-21 (×2): 2 g via INTRAVENOUS
  Filled 2020-02-21 (×2): qty 100

## 2020-02-21 MED ORDER — OXYCODONE-ACETAMINOPHEN 5-325 MG PO TABS: 1.0000 | ORAL_TABLET | ORAL | Status: DC | PRN

## 2020-02-21 MED ORDER — SODIUM CHLORIDE 0.9 % IV BOLUS
500.0000 mL | Freq: Once | INTRAVENOUS | Status: AC
  Administered 2020-02-21: 500 mL via INTRAVENOUS

## 2020-02-21 MED ORDER — POTASSIUM CHLORIDE CRYS ER 20 MEQ PO TBCR: 20.0000 meq | EXTENDED_RELEASE_TABLET | Freq: Every day | ORAL | Status: DC | PRN

## 2020-02-21 MED ORDER — SODIUM CHLORIDE 0.9 % IV SOLN: INTRAVENOUS | Status: AC

## 2020-02-21 MED ORDER — MORPHINE SULFATE (PF) 2 MG/ML IV SOLN: 2.0000 mg | INTRAVENOUS | Status: DC | PRN

## 2020-02-21 MED ORDER — SODIUM CHLORIDE 0.9 % IV SOLN: 500.0000 mL | Freq: Once | INTRAVENOUS | Status: DC | PRN

## 2020-02-21 NOTE — Progress Notes (Deleted)
PHARMACIST LIPID MONITORING   Christine Pugh is a 86 y.o. female admitted on 02/17/2020 with bri.  Pharmacy has been consulted to optimize lipid-lowering therapy with the indication of secondary prevention for clinical ASCVD.   Assessment:  Patient is excluded from the protocol due to age and renal function (ESRD, elevated LFTs, pregnancy/breastfeeding, active liver disease)  Plan: No addition of lipid lowering therapy in 86 year old female.  Tad Moore, PharmD 02/21/2020, 7:42 AM

## 2020-02-21 NOTE — Progress Notes (Signed)
ANTICOAGULATION CONSULT NOTE   Pharmacy Consult for Heparin Indication: atrial fibrillation   No Known Allergies  Patient Measurements: Height: 5\' 3"  (160 cm) Weight: 63 kg (139 lb) IBW/kg (Calculated) : 52.4 Heparin Dosing Weight: 63  Vital Signs: Temp: 98.5 F (36.9 C) (07/21 0800) Temp Source: Oral (07/21 0800) BP: 106/95 (07/21 1048) Pulse Rate: 64 (07/21 1048)  Labs: Recent Labs    02/19/20 0737 02/19/20 0737 02/20/20 0308 02/20/20 0308 02/20/20 1317 02/20/20 1317 02/20/20 1502 02/21/20 0505 02/21/20 1209  HGB 13.2   < > 13.0   < > 10.9*   < > 10.5* 9.2*  --   HCT 40.8   < > 39.7   < > 32.0*  --  31.0* 27.7*  --   PLT 201  --  215  --   --   --   --  173  --   APTT 74*  --  64*  --   --   --   --   --  60*  LABPROT  --   --  13.7  --   --   --   --   --   --   INR  --   --  1.1  --   --   --   --   --   --   HEPARINUNFRC 1.06*  --  0.53  --   --   --   --   --  0.26*  CREATININE  --   --  1.04*  --   --   --   --  1.05*  --    < > = values in this interval not displayed.    Estimated Creatinine Clearance: 28.1 mL/min (A) (by C-G formula based on SCr of 1.05 mg/dL (H)).   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 Pharmacy consulted to bridge from apixaban to heparin. S/p repair pseudoaneurysm in OR 7/20.  Last dose of apixaban 7/17 at 0900. aPTT and HL not yet correlating 7/20 am.  Heparin level and PTT sub-therpeutic  Goal of Therapy:  aPTT 66-102 seconds (HL 0.3-0.7 units/ml) Monitor platelets by anticoagulation protocol: Yes  Plan:  -Increase heparin to 750 units / hr - Daily aPTT and heparin level  and monitor for correlation  Thank you  Anette Guarneri, PharmD 02/21/2020, 12:49 PM

## 2020-02-21 NOTE — Progress Notes (Addendum)
A line discontinued at this time. Patient tolerated well.

## 2020-02-21 NOTE — Progress Notes (Addendum)
Progress Note    02/21/2020 6:34 AM 1 Day Post-Op  Subjective:  S/p left brachial to ulnar artery bypass with right saphenous vein secondary to psa/transection of brachial artery.  And alert this morning.  Heparin infusion restarted.  Denies hand pain or numbness   Vitals:   02/21/20 0000 02/21/20 0101  BP: (!) 83/48 (!) 84/53  Pulse: 64 62  Resp: 13 18  Temp:  97.9 F (36.6 C)  SpO2: 95% 98%  HR: 60-70s overnight  Physical Exam: Cardiac: Rate and rhythm are regular Lungs: Auscultation bilaterally Incisions: Left upper arm surgical dressings intact.  Ace wrap is dry Extremities: She has 5 out of 5 left hand grip strength.  Hand is warm with motor function and sensation intact.  Weakly palpable radial pulse.  Left proximal medial thigh incision is well approximated.   CBC    Component Value Date/Time   WBC 10.7 (H) 02/21/2020 0505   RBC 2.85 (L) 02/21/2020 0505   HGB 9.2 (L) 02/21/2020 0505   HCT 27.7 (L) 02/21/2020 0505   PLT 173 02/21/2020 0505   MCV 97.2 02/21/2020 0505   MCH 32.3 02/21/2020 0505   MCHC 33.2 02/21/2020 0505   RDW 13.2 02/21/2020 0505   LYMPHSABS 1.1 08/29/2016 0516   MONOABS 1.3 (H) 08/29/2016 0516   EOSABS 0.3 08/29/2016 0516   BASOSABS 0.0 08/29/2016 0516    BMET    Component Value Date/Time   NA 137 02/21/2020 0505   K 4.2 02/21/2020 0505   CL 104 02/21/2020 0505   CO2 24 02/21/2020 0505   GLUCOSE 135 (H) 02/21/2020 0505   BUN 13 02/21/2020 0505   CREATININE 1.05 (H) 02/21/2020 0505   CALCIUM 8.0 (L) 02/21/2020 0505   GFRNONAA 45 (L) 02/21/2020 0505   GFRAA 52 (L) 02/21/2020 0505     Intake/Output Summary (Last 24 hours) at 02/21/2020 0634 Last data filed at 02/20/2020 1830 Gross per 24 hour  Intake 2200 ml  Output 1600 ml  Net 600 ml    HOSPITAL MEDICATIONS Scheduled Meds: . sodium chloride   Intravenous Once  . amiodarone  100 mg Oral Daily  . docusate sodium  100 mg Oral BID  . hydrocortisone  25 mg Rectal BID  .  levothyroxine  112 mcg Oral Q0600  . metoprolol succinate  12.5 mg Oral BID  . multivitamin with minerals  1 tablet Oral Daily  . pantoprazole  40 mg Oral Daily  . potassium chloride  20-40 mEq Oral Once  . sodium chloride flush  3 mL Intravenous Q12H  . triamterene-hydrochlorothiazide  1 tablet Oral Daily   Continuous Infusions: . sodium chloride 250 mL (02/19/20 2100)  . sodium chloride    . sodium chloride 75 mL/hr at 02/21/20 0428  .  ceFAZolin (ANCEF) IV 2 g (02/21/20 0432)  . heparin 650 Units/hr (02/21/20 0449)  . magnesium sulfate bolus IVPB     PRN Meds:.sodium chloride, sodium chloride, acetaminophen **OR** acetaminophen, alum & mag hydroxide-simeth, guaiFENesin-dextromethorphan, hydrALAZINE, labetalol, magnesium sulfate bolus IVPB, metoprolol tartrate, morphine injection, ondansetron, oxyCODONE-acetaminophen, phenol, potassium chloride, sodium chloride flush  Assessment:POD 1 left brachial to ulnar artery bypass with reversed right greater saphenous vein.  Her hand is viable and she has a weakly palpable radial pulse.  Blood pressure soft.  She has not been out of bed yet.  Her hemoglobin is stable  Plan: -Continue current treatment plan/monitoring -DVT prophylaxis: Heparin infusion   Risa Grill, PA-C [facility] [phone] 02/21/2020  6:34 AM  Agree with above.  Radial pulse not as prominent as yesterday but no symptoms in hand. May be secondary to low BP or possible graft occlusion.  Would not return to OR unless symptomatic.  Possible d/c tomorrow.  Pt requesting SNF/rehab.  Lives alone. Restart eliquis at d/c  Ruta Hinds, MD [facility] [place] Office: [phone]

## 2020-02-21 NOTE — Progress Notes (Signed)
ANTICOAGULATION CONSULT NOTE   Pharmacy Consult for Heparin Indication: atrial fibrillation   No Known Allergies  Patient Measurements: Height: 5\' 3"  (160 cm) Weight: 63 kg (139 lb) IBW/kg (Calculated) : 52.4 Heparin Dosing Weight: 63  Vital Signs: Temp: 97.9 F (36.6 C) (07/21 0101) Temp Source: Oral (07/21 0101) BP: 84/53 (07/21 0101) Pulse Rate: 62 (07/21 0101)  Labs: Recent Labs    02/18/20 2140 02/19/20 0737 02/19/20 0737 02/20/20 0308 02/20/20 0308 02/20/20 1317 02/20/20 1502  HGB  --  13.2   < > 13.0   < > 10.9* 10.5*  HCT  --  40.8   < > 39.7  --  32.0* 31.0*  PLT  --  201  --  215  --   --   --   APTT 124* 74*  --  64*  --   --   --   LABPROT  --   --   --  13.7  --   --   --   INR  --   --   --  1.1  --   --   --   HEPARINUNFRC  --  1.06*  --  0.53  --   --   --   CREATININE  --   --   --  1.04*  --   --   --    < > = values in this interval not displayed.    Estimated Creatinine Clearance: 28.3 mL/min (A) (by C-G formula based on SCr of 1.04 mg/dL (H)).   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 Pharmacy consulted to bridge from apixaban to heparin. S/p repair pseudoaneurysm in OR 7/20.  Last dose of apixaban 7/17 at 0900. aPTT and HL not yet correlating 7/20 am.  Heparin off since pt went to surgery (~10am 7/20), orders to restart heparin (no bolus) at 0400.  Goal of Therapy:  aPTT 66-102 seconds (HL 0.3-0.7 units/ml) Monitor platelets by anticoagulation protocol: Yes  Plan:  - Restart heparin infusion at 650 units/hr - F/u 8 hr PTT and heparin level - Daily aPTT and heparin level  and monitor for correlation  Sherlon Handing, PharmD, BCPS Please see amion for complete clinical pharmacist phone list 02/21/2020, 4:04 AM

## 2020-02-21 NOTE — Progress Notes (Signed)
Mobility Specialist - Progress Note   02/21/20 1543  Mobility  Activity Transferred to/from [facility]  Level of Assistance Minimal assist, patient does 75% or more  Assistive Device Front wheel walker  Distance Ambulated (ft) 8 ft  Mobility Response Tolerated fair  Mobility performed by Mobility specialist  $Mobility charge 1 Mobility    Pre-mobility: 68 HR, 90/47 BP, 99% SpO2 Post-mobility: 70 HR, 112/69 BP, 98% SpO2  Pt stated her body felt increasingly shaky when transferring from the [facility] to her bed. When she stood up from her bed her Purewick came out and the pt urinated on herself and the floor, her gown was changed and I cleaned her feet/legs.   Christine Pugh Mobility Specialist Mobility Specialist Phone: [phone]

## 2020-02-21 NOTE — Care Management Important Message (Signed)
Important Message  Patient Details  Name: Christine Pugh MRN: [id number] Date of Birth: 20-Mar-1922   Medicare Important Message Given:  Yes     Shelda Altes 02/21/2020, 11:18 AM

## 2020-02-21 NOTE — Progress Notes (Signed)
PHARMACIST LIPID MONITORING   Christine Pugh is a 86 y.o. female admitted on 02/17/2020 s/p bypass surgery.  Pharmacy has been consulted to optimize lipid-lowering therapy with the indication of secondary prevention for clinical ASCVD.   Assessment:  Patient is excluded from the protocol due to age and renal function (ESRD, elevated LFTs, pregnancy/breastfeeding, active liver disease)  Plan: No addition of lipid lowering therapy in 86 year old female.  Christine Pugh, PharmD 02/21/2020, 12:47 PM

## 2020-02-22 LAB — HEPARIN LEVEL (UNFRACTIONATED): Heparin Unfractionated: 0.47 IU/mL (ref 0.30–0.70)

## 2020-02-22 LAB — CBC
HCT: 27.2 % — ABNORMAL LOW (ref 36.0–46.0)
Hemoglobin: 8.6 g/dL — ABNORMAL LOW (ref 12.0–15.0)
MCH: 31.2 pg (ref 26.0–34.0)
MCHC: 31.6 g/dL (ref 30.0–36.0)
MCV: 98.6 fL (ref 80.0–100.0)
Platelets: 155 10*3/uL (ref 150–400)
RBC: 2.76 MIL/uL — ABNORMAL LOW (ref 3.87–5.11)
RDW: 13.8 % (ref 11.5–15.5)
WBC: 8.3 10*3/uL (ref 4.0–10.5)
nRBC: 0 % (ref 0.0–0.2)

## 2020-02-22 LAB — APTT: aPTT: 118 seconds — ABNORMAL HIGH (ref 24–36)

## 2020-02-22 NOTE — Progress Notes (Addendum)
ANTICOAGULATION CONSULT NOTE   Pharmacy Consult for Heparin Indication: atrial fibrillation   No Known Allergies  Patient Measurements: Height: 5\' 3"  (160 cm) Weight: 63 kg (139 lb) IBW/kg (Calculated) : 52.4 Heparin Dosing Weight: 63  Vital Signs: Temp: 98.3 F (36.8 C) (07/22 0530) Temp Source: Oral (07/22 0530) BP: 106/73 (07/22 0530) Pulse Rate: 63 (07/22 0530)  Labs: Recent Labs    02/20/20 0308 02/20/20 1317 02/20/20 1502 02/20/20 1502 02/21/20 0505 02/21/20 1209 02/22/20 0349  HGB 13.0   < > 10.5*   < > 9.2*  --  8.6*  HCT 39.7   < > 31.0*  --  27.7*  --  27.2*  PLT 215  --   --   --  173  --  155  APTT 64*  --   --   --   --  60* 118*  LABPROT 13.7  --   --   --   --   --   --   INR 1.1  --   --   --   --   --   --   HEPARINUNFRC 0.53  --   --   --   --  0.26* 0.47  CREATININE 1.04*  --   --   --  1.05*  --   --    < > = values in this interval not displayed.    Estimated Creatinine Clearance: 28.1 mL/min (A) (by C-G formula based on SCr of 1.05 mg/dL (H)).   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 Pharmacy consulted to bridge from apixaban to heparin. S/p repair pseudoaneurysm in OR 7/20.  Last dose of apixaban 7/17 at 0900. HL today 0.47, therapeutic. H/H has dropped since admission. Bleeding noted overnight from left arm at surgical site but no hematoma present.  Goal of Therapy:  HL 0.3-0.7 units/ml Monitor platelets by anticoagulation protocol: Yes  Plan:  -Will d/c aPTT now that patient has been off apixaban since 7/17 -Decrease heparin by 50 units / hr to 700 units / hr -Per MD note, d/c heparin x2h if bleeding continues -Daily heparin level, monitor H/H -Will transition back to apixaban at d/c  Mercy Riding, PharmD PGY1 [place] Resident Please refer to [facility] for unit-specific pharmacist

## 2020-02-22 NOTE — Evaluation (Signed)
Physical Therapy Evaluation Patient Details Name: Christine Pugh MRN: [id number] DOB: 1921-12-24 Today's Date: 02/22/2020   History of Present Illness  Pt is a 86 y.o. female admitted 02/17/20 with expanding left brachial artery pseudoaneurysm. S/p L brachial artery repair pseudoaneurysm leg vein harvest 7/20. PMH includes PAF, stroke, syncope, HTN, aortic stenosis, memory loss.    Clinical Impression  Pt presents with an overall decrease in functional mobility secondary to above. PTA, pt mod indep ambulating with rollator, lives alone, has PCA assist 3x/wk with transportation and household tasks. Today, pt requiring up to minA for mobility. Limited by generalized weakness, decreased activity tolerance and LUE pain. Motivated to participate and regain PLOF. Pt would benefit from continued acute PT services to maximize functional mobility and independence prior to d/c with SNF-level therapies; pt in agreement.   BP 123/62, SpO2 96% on RA, HR 72    Follow Up Recommendations SNF;Supervision for mobility/OOB    Equipment Recommendations  None recommended by PT    Recommendations for Other Services       Precautions / Restrictions Precautions Precautions: Fall Restrictions Other Position/Activity Restrictions: No WB orders, limited WB through LUE s/p brachial artery repair      Mobility  Bed Mobility Overal bed mobility: Needs Assistance Bed Mobility: Supine to Sit     Supine to sit: Supervision;HOB elevated     General bed mobility comments: Increased time and effort  Transfers Overall transfer level: Needs assistance Equipment used: Rolling walker (2 wheeled) Transfers: Sit to/from Stand Sit to Stand: Min assist;Min guard         General transfer comment: MinA to elevate trunk standing from EOB; min guard to stand from recliner, heavy reliance on BUE support  Ambulation/Gait Ambulation/Gait assistance: Min guard;Min assist Gait Distance (Feet): 20 Feet Assistive  device: Rolling walker (2 wheeled) Gait Pattern/deviations: Step-through pattern;Decreased stride length;Decreased dorsiflexion - right Gait velocity: Decreased Gait velocity interpretation: <1.31 ft/sec, indicative of household ambulator General Gait Details: Slow, mildly unsteady gait with RW and intermittent minA to maintain balance; pt reports "dragging" R leg is baseline with decreased DF noted. Cues to maintain closer proximity to [employer]    Modified Rankin (Stroke Patients Only)       Balance Overall balance assessment: Needs assistance   Sitting balance-Leahy Scale: Fair Sitting balance - Comments: Able to don Depends over L foot, assist for R foot     Standing balance-Leahy Scale: Poor Standing balance comment: Reliant on UE support                             Pertinent Vitals/Pain Pain Assessment: Faces Faces Pain Scale: Hurts a little bit Pain Location: LUE Pain Descriptors / Indicators: Discomfort Pain Intervention(s): Monitored during session    Home Living Family/patient expects to be discharged to:: Skilled nursing facility Living Arrangements: Alone Available Help at Discharge: Family;Friend(s);Available PRN/intermittently Type of Home: Apartment Home Access: Level entry;Ramped entrance     Home Layout: One level Home Equipment: Walker - 2 wheels;Walker - 4 wheels      Prior Function Level of Independence: Needs assistance   Gait / Transfers Assistance Needed: Short ambulation distances with rollator. Enjoys swimming a couple laps at [place] / Homemaking Assistance Needed: Wears depends due to urinary incontinence. Has PCA 3x/wk to assist with driving, food prep, housework  Hand Dominance        Extremity/Trunk Assessment   Upper Extremity Assessment Upper Extremity Assessment: Generalized weakness;LUE deficits/detail LUE Deficits / Details: s/p L brachial artery repair     Lower Extremity Assessment Lower Extremity Assessment: Generalized weakness;RLE deficits/detail RLE Deficits / Details: Baseline RLE weakness, functionally at least 3/5 hip and knee, R DF <3/5    Cervical / Trunk Assessment Cervical / Trunk Assessment: Kyphotic  Communication   Communication: HOH  Cognition Arousal/Alertness: Awake/alert Behavior During Therapy: WFL for tasks assessed/performed Overall Cognitive Status: Within Functional Limits for tasks assessed                                 General Comments: WFL for simple tasks, not formally assessed; apparent short-term memory deficits, likely baseline      General Comments General comments (skin integrity, edema, etc.): BP 123/62, SpO2 96% on RA, HR 72    Exercises     Assessment/Plan    PT Assessment Patient needs continued PT services  PT Problem List Decreased strength;Decreased activity tolerance;Decreased balance;Decreased mobility;Decreased knowledge of use of DME;Pain       PT Treatment Interventions DME instruction;Gait training;Functional mobility training;Therapeutic activities;Therapeutic exercise;Balance training;Patient/family education    PT Goals (Current goals can be found in the Care Plan section)  Acute Rehab PT Goals Patient Stated Goal: "I'll go anywhere as long as I can leave here" PT Goal Formulation: With patient Time For Goal Achievement: 03/07/20 Potential to Achieve Goals: Good    Frequency Min 2X/week   Barriers to discharge        Co-evaluation               AM-PAC PT "6 Clicks" Mobility  Outcome Measure Help needed turning from your back to your side while in a flat bed without using bedrails?: A Little Help needed moving from lying on your back to sitting on the side of a flat bed without using bedrails?: A Little Help needed moving to and from a bed to a chair (including a wheelchair)?: A Little Help needed standing up from a chair using your arms (e.g.,  wheelchair or bedside chair)?: A Little Help needed to walk in hospital room?: A Little Help needed climbing 3-5 steps with a railing? : A Lot 6 Click Score: 17    End of Session Equipment Utilized During Treatment: Gait belt Activity Tolerance: Patient tolerated treatment well Patient left: in chair;with call bell/phone within reach;with chair alarm set Nurse Communication: Mobility status PT Visit Diagnosis: Other abnormalities of gait and mobility (R26.89)    Time: 5015-8682 PT Time Calculation (min) (ACUTE ONLY): 24 min   Charges:   PT Evaluation $PT Eval Moderate Complexity: 1 Mod PT Treatments $Therapeutic Exercise: 8-22 mins   Mabeline Caras, PT, DPT Acute Rehabilitation Services  Pager [phone] Office [place] 02/22/2020, 5:08 PM

## 2020-02-22 NOTE — Progress Notes (Addendum)
  Progress Note    02/22/2020 7:12 AM 2 Days Post-Op  Subjective:  Feels good this morning. States she accidentally rolled over onto her left shoulder/ arm during the night and it was somewhat painful. Had some bleeding from left arm. Dressings were reinforced. Minimal arm soreness this morning. No hand pain, numbness or weakness   Vitals:   02/22/20 0425 02/22/20 0530  BP:  106/73  Pulse: 70 63  Resp: 12 14  Temp: 98.3 F (36.8 C) 98.3 F (36.8 C)  SpO2: 100% 98%   Physical Exam: Cardiac:  regular Lungs: non labored Incisions:  Left upper extremity dressings dry. Underlying dressings with some bleeding. Right groin incision clean, dry and intact without hematoma or swelling Extremities: Left upper extremity well perfused and warm. 5/5 motor and strength left hand. Palpable radial pulse Abdomen:  Soft and non tender Neurologic: alert and oriented  CBC    Component Value Date/Time   WBC 8.3 02/22/2020 0349   RBC 2.76 (L) 02/22/2020 0349   HGB 8.6 (L) 02/22/2020 0349   HCT 27.2 (L) 02/22/2020 0349   PLT 155 02/22/2020 0349   MCV 98.6 02/22/2020 0349   MCH 31.2 02/22/2020 0349   MCHC 31.6 02/22/2020 0349   RDW 13.8 02/22/2020 0349   LYMPHSABS 1.1 08/29/2016 0516   MONOABS 1.3 (H) 08/29/2016 0516   EOSABS 0.3 08/29/2016 0516   BASOSABS 0.0 08/29/2016 0516    BMET    Component Value Date/Time   NA 137 02/21/2020 0505   K 4.2 02/21/2020 0505   CL 104 02/21/2020 0505   CO2 24 02/21/2020 0505   GLUCOSE 135 (H) 02/21/2020 0505   BUN 13 02/21/2020 0505   CREATININE 1.05 (H) 02/21/2020 0505   CALCIUM 8.0 (L) 02/21/2020 0505   GFRNONAA 45 (L) 02/21/2020 0505   GFRAA 52 (L) 02/21/2020 0505    INR    Component Value Date/Time   INR 1.1 02/20/2020 0308     Intake/Output Summary (Last 24 hours) at 02/22/2020 0354 Last data filed at 02/21/2020 1500 Gross per 24 hour  Intake 1303.61 ml  Output 400 ml  Net 903.61 ml     Assessment/Plan:  86 y.o. female is s/p  left brachial to ulnar artery bypass with right saphenous vein secondary to psa/ transection of brachial artery 2 Days Post-Op. Had some oozing from bandage overnight. Left hand warm without pain. Motor and sensation intact. Palpable radial pulse. Hgb trending down slightly at 8.6 this morning. Discharge planning SNF/Rehab. Heparin per Pharmacy. Will transition to Eliquis on d/c   Karoline Caldwell, PA-C [facility] [phone] 02/22/2020 7:12 AM   Apparently some bloody drainage from arm overnight.  No hematoma.  1+ left radial pulse.  Sensory motor to hand intact  Will start d/c planning. Resume eliquis on d/c If has continued ooze today will d/c heparin for 2 hours.  Ruta Hinds, MD [facility] [place] Office: [phone]

## 2020-02-23 LAB — HEPARIN LEVEL (UNFRACTIONATED): Heparin Unfractionated: 0.33 IU/mL (ref 0.30–0.70)

## 2020-02-23 MED ORDER — POLYETHYLENE GLYCOL 3350 17 G PO PACK
17.0000 g | PACK | Freq: Every day | ORAL | Status: DC | PRN
  Administered 2020-02-24: 17 g via ORAL
  Filled 2020-02-23 (×2): qty 1

## 2020-02-23 NOTE — Evaluation (Signed)
Occupational Therapy Evaluation Patient Details Name: Christine Pugh MRN: [id number] DOB: 11-23-1921 Today's Date: 02/23/2020    History of Present Illness Pt is a 86 y.o. female admitted 02/17/20 with expanding left brachial artery pseudoaneurysm. S/p L brachial artery repair pseudoaneurysm leg vein harvest 7/20. PMH includes PAF, stroke, syncope, HTN, aortic stenosis, memory loss.   Clinical Impression   PTA pt living alone and functioning at mod I level for BADLs. Family helps to assist with IADLs. At time of eval, pt presents with ability to complete bed mobility at supervision assist and sit <> stands at min A with RW. Pt is unsteady with functional mobility requiring min A for steadying to complete a short household distance. At baseline, pt is incontinent of urine and requires a brief and pad to contain urine output. She was able to don brief with mod A to get over feet and then up over hips when steadying self on RW. Given current status, recommend SNF to support safety, BADL engagement, and quite independent PLOF. OT will continue to follow per POC listed below.   Follow Up Recommendations  SNF    Equipment Recommendations  3 in 1 bedside commode    Recommendations for Other Services       Precautions / Restrictions Precautions Precautions: Fall Restrictions Weight Bearing Restrictions: No Other Position/Activity Restrictions: No WB orders, limited WB through LUE s/p brachial artery repair      Mobility Bed Mobility Overal bed mobility: Needs Assistance Bed Mobility: Supine to Sit     Supine to sit: Supervision;HOB elevated     General bed mobility comments: Increased time and effort  Transfers Overall transfer level: Needs assistance Equipment used: Rolling walker (2 wheeled) Transfers: Sit to/from Stand Sit to Stand: Min assist         General transfer comment: min A to power up and steady with RW    Balance Overall balance assessment: Needs assistance    Sitting balance-Leahy Scale: Fair     Standing balance support: Bilateral upper extremity supported;During functional activity Standing balance-Leahy Scale: Poor Standing balance comment: Reliant on UE support                           ADL either performed or assessed with clinical judgement   ADL Overall ADL's : Needs assistance/impaired Eating/Feeding: Set up;Sitting   Grooming: Min guard;Standing Grooming Details (indicate cue type and reason): requires UE support Upper Body Bathing: Minimal assistance;Sitting   Lower Body Bathing: Moderate assistance;Sit to/from stand;Sitting/lateral leans   Upper Body Dressing : Minimal assistance;Sitting   Lower Body Dressing: Moderate assistance;Sit to/from stand;Sitting/lateral leans Lower Body Dressing Details (indicate cue type and reason): to don briefs Toilet Transfer: Minimal assistance;Moderate assistance;Ambulation;RW;Regular Toilet;Grab bars   Toileting- Clothing Manipulation and Hygiene: Moderate assistance;Sit to/from stand       Functional mobility during ADLs: Minimal assistance;Moderate assistance;Rolling walker       Vision Patient Visual Report: No change from baseline       Perception     Praxis      Pertinent Vitals/Pain       Hand Dominance     Extremity/Trunk Assessment Upper Extremity Assessment Upper Extremity Assessment: LUE deficits/detail;Generalized weakness LUE Deficits / Details: s/p L brachial artery repair   Lower Extremity Assessment Lower Extremity Assessment: Defer to PT evaluation       Communication Communication Communication: HOH   Cognition Arousal/Alertness: Awake/alert   Overall Cognitive Status: Within Functional Limits for  tasks assessed                                     General Comments       Exercises     Shoulder Instructions      Home Living Family/patient expects to be discharged to:: Skilled nursing facility Living  Arrangements: Alone Available Help at Discharge: Family;Friend(s);Available PRN/intermittently Type of Home: Apartment Home Access: Level entry;Ramped entrance     Home Layout: One level               Home Equipment: Walker - 2 wheels;Walker - 4 wheels          Prior Functioning/Environment Level of Independence: Needs assistance  Gait / Transfers Assistance Needed: Short ambulation distances with rollator. Enjoys swimming a couple laps at [place] / Homemaking Assistance Needed: Wears depends due to urinary incontinence. Has PCA 3x/wk to assist with driving, food prep, housework            OT Problem List: Decreased strength;Decreased knowledge of use of DME or AE;Decreased activity tolerance;Impaired balance (sitting and/or standing);Decreased safety awareness      OT Treatment/Interventions: Self-care/ADL training;Therapeutic exercise;Patient/family education;Balance training;Therapeutic activities;DME and/or AE instruction    OT Goals(Current goals can be found in the care plan section) Acute Rehab OT Goals Patient Stated Goal: "I'll go anywhere as long as I can leave here" OT Goal Formulation: With patient Time For Goal Achievement: 03/08/20 Potential to Achieve Goals: Good  OT Frequency: Min 2X/week   Barriers to D/C:            Co-evaluation              AM-PAC OT "6 Clicks" Daily Activity     Outcome Measure Help from another person eating meals?: A Little Help from another person taking care of personal grooming?: A Little Help from another person toileting, which includes using toliet, bedpan, or urinal?: A Lot Help from another person bathing (including washing, rinsing, drying)?: A Lot Help from another person to put on and taking off regular upper body clothing?: A Little Help from another person to put on and taking off regular lower body clothing?: A Lot 6 Click Score: 15   End of Session Equipment Utilized During Treatment: Gait  belt;Rolling walker Nurse Communication: Mobility status  Activity Tolerance: Patient tolerated treatment well Patient left: in bed;with call bell/phone within reach;with family/visitor present  OT Visit Diagnosis: Unsteadiness on feet (R26.81);Other abnormalities of gait and mobility (R26.89);Muscle weakness (generalized) (M62.81)                Time: 1551-1610 OT Time Calculation (min): 19 min Charges:  OT General Charges $OT Visit: 1 Visit OT Evaluation $OT Eval Moderate Complexity: [place], MSOT, OTR/L [facility] Office Number: [phone] Pager: [phone]  Zenovia Jarred 02/23/2020, 5:07 PM

## 2020-02-23 NOTE — TOC Initial Note (Addendum)
Transition of Care [facility]) - Initial/Assessment Note    Patient Details  Name: Christine Pugh MRN: [id number] Date of Birth: 05/06/1922  Transition of Care [facility]) CM/SW Contact:    Vinie Sill, [place] Phone Number: 02/23/2020, 3:14 PM  Clinical Narrative:                   Update: [facility] -declined no bed availability- waiting on response form the [facility].   CSW spoke with patient's son, Thayer Jew. CSW introduced self and explained role. CSW discussed with patient's son , PT recommendation of short term rehab at [facility]. He reports patient lives home alone with the assistance of a aide 3x per week for a couple of hours. He is agreeable to SNF placement.  CSW visit the patient at bedside. CSW introduced self and explained role. Patient states she was agreeable to SNF placement. CSW explained the SNF process. [facility] was the preferred SNF. CSW was given permission to send referrals to other SNFs.    CSW will continue to follow and assist with discharge planning.  Thurmond Butts, MSW, [place] Clinical Social Worker   Expected Discharge Plan: Skilled Nursing Facility Barriers to Discharge: Continued Medical Work up   Patient Goals and CMS Choice        Expected Discharge Plan and Services Expected Discharge Plan: [place]       Living arrangements for the past 2 months: [place]                                      Prior Living Arrangements/Services Living arrangements for the past 2 months: [place] Lives with:: Self Patient language and need for interpreter reviewed:: No        Need for Family Participation in Patient Care: Yes (Comment) Care giver support system in place?: Yes (comment)   Criminal Activity/Legal Involvement Pertinent to Current Situation/Hospitalization: No - Comment as needed  Activities of Daily Living      Permission Sought/Granted Permission sought to share information  with : Family Supports Permission granted to share information with : Yes, Verbal Permission Granted  Share Information with NAME: Cathren Laine  Permission granted to share info w AGENCY: SNF  Permission granted to share info w Relationship: son  Permission granted to share info w Contact Information: [phone]  Emotional Assessment Appearance:: Appears stated age     Orientation: : Oriented to Self, Oriented to Place, Oriented to  Time, Oriented to Situation Alcohol / Substance Use: Not Applicable Psych Involvement: No (comment)  Admission diagnosis:  Preop cardiovascular exam [Z01.810] Aneurysm of left brachial artery (HCC) [I72.1] Brachial artery aneurysm ([place]) [I72.1] Patient Active Problem List   Diagnosis Date Noted  . Aneurysm of left brachial artery ([place]) 02/17/2020  . Memory loss 11/26/2016  . PAF (paroxysmal atrial fibrillation) ([place]) 10/07/2016  . 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 Hemorrhoids 09/03/2016  . Embolic stroke involving middle cerebral artery ([place]) 08/28/2016  . Embolism of right middle cerebral artery 08/24/2016  . Thromboembolic stroke ([place]) 65/46/5035  . Ischemia of extremity 08/23/2016  . Syncope 01/06/2016  . Atypical atrial flutter ([place]) 10/28/2015  . Essential hypertension 10/28/2015  . Aortic valve disorder 05/19/2009  . SVT/ PSVT/ PAT 05/19/2009   PCP:  Caryl Bis, MD Pharmacy:   [id number] [place], [place]  [address] [facility] [place] [address] Phone: [phone] Fax: [phone]     Social Determinants of Health (SDOH) Interventions    Readmission Risk Interventions No flowsheet data found.

## 2020-02-23 NOTE — Progress Notes (Signed)
Mobility Specialist - Progress Note   02/23/20 1657  Mobility  Activity Ambulated in room  Level of Assistance Modified independent, requires aide device or extra time  Assistive Device Four wheel walker  Distance Ambulated (ft) 24 ft  Mobility Response Tolerated well  Mobility performed by Mobility specialist  $Mobility charge 1 Mobility    Pre-mobility: 65 HR Post-mobility: 71 HR  Pt says she prefers to use a rollator over the RW as that is what she has at home.   Pricilla Handler Mobility Specialist Mobility Specialist Phone: [phone]

## 2020-02-23 NOTE — NC FL2 (Signed)
[place] LEVEL OF CARE SCREENING TOOL     IDENTIFICATION  Patient Name: Christine Pugh Birthdate: Jun 02, 1922 Sex: female Admission Date (Current Location): 02/17/2020  [facility] [place] Number:  Herbalist and Address:  [facility] [place] [address], [place], [place] [address]      Provider Number: [id number]  Attending Physician Name and Address:  Elam Dutch, MD  Relative Name and Phone Number:       Current Level of Care: Hospital Recommended Level of Care: [place] Prior Approval Number:    Date Approved/Denied:   PASRR Number: [id number]  Discharge Plan: SNF    Current Diagnoses: Patient Active Problem List   Diagnosis Date Noted  . Aneurysm of left brachial artery ([place]) 02/17/2020  . Memory loss 11/26/2016  . PAF (paroxysmal atrial fibrillation) ([place]) 10/07/2016  . 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 Hemorrhoids 09/03/2016  . Embolic stroke involving middle cerebral artery ([place]) 08/28/2016  . Embolism of right middle cerebral artery 08/24/2016  . Thromboembolic stroke ([place]) 62/83/6629  . Ischemia of extremity 08/23/2016  . Syncope 01/06/2016  . Atypical atrial flutter ([place]) 10/28/2015  . Essential hypertension 10/28/2015  . Aortic valve disorder 05/19/2009  . SVT/ PSVT/ PAT 05/19/2009    Orientation RESPIRATION BLADDER Height & Weight     Self, Time, Situation, Place  Normal External catheter, Incontinent Weight: 139 lb (63 kg) Height:  5\' 3"  (160 cm)  BEHAVIORAL SYMPTOMS/MOOD NEUROLOGICAL BOWEL NUTRITION STATUS      Continent Diet (please see discharge summary)  AMBULATORY STATUS COMMUNICATION OF NEEDS Skin   Limited Assist Verbally Surgical wounds (closed incison,left arm;closed incsion Rt groin)                       Personal Care Assistance Level of Assistance  Bathing, Dressing, Feeding Bathing Assistance: Limited assistance Feeding  assistance: Independent Dressing Assistance: Limited assistance     Functional Limitations Info  Sight, Speech, Hearing Sight Info: Adequate Hearing Info: Adequate Speech Info: Adequate    SPECIAL CARE FACTORS FREQUENCY  PT (By licensed PT), OT (By licensed OT)     PT Frequency: 5x per week OT Frequency: 5xper wek            Contractures Contractures Info: Not present    Additional Factors Info  Code Status, Allergies Code Status Info: DNR Allergies Info: NKA           Current Medications (02/23/2020):  This is the current hospital active medication list Current Facility-Administered Medications  Medication Dose Route Frequency Provider Last Rate Last Admin  . 0.9 %  sodium chloride infusion (Manually program via Guardrails IV Fluids)   Intravenous Once Dagoberto Ligas, PA-C      . 0.9 %  sodium chloride infusion  250 mL Intravenous PRN Dagoberto Ligas, PA-C 10 mL/hr at 02/19/20 2100 250 mL at 02/19/20 2100  . 0.9 %  sodium chloride infusion  500 mL Intravenous Once PRN Dagoberto Ligas, PA-C      . acetaminophen (TYLENOL) tablet 325-650 mg  325-650 mg Oral Q4H PRN Dagoberto Ligas, PA-C   650 mg at 02/23/20 4765   Or  . acetaminophen (TYLENOL) suppository 325-650 mg  325-650 mg Rectal Q4H PRN Dagoberto Ligas, PA-C      . alum & mag hydroxide-simeth (MAALOX/MYLANTA) 200-200-20 MG/5ML suspension 15-30 mL  15-30 mL Oral Q2H PRN Dagoberto Ligas, PA-C      .  amiodarone (PACERONE) tablet 100 mg  100 mg Oral Daily Dagoberto Ligas, PA-C   100 mg at 02/23/20 1043  . docusate sodium (COLACE) capsule 100 mg  100 mg Oral BID Dagoberto Ligas, PA-C   100 mg at 02/23/20 1043  . guaiFENesin-dextromethorphan (ROBITUSSIN DM) 100-10 MG/5ML syrup 15 mL  15 mL Oral Q4H PRN Dagoberto Ligas, PA-C      . heparin ADULT infusion 100 units/mL (25000 units/253mL sodium chloride 0.45%)  700 Units/hr Intravenous Continuous Amedeo Plenty, [place] 7 mL/hr at 02/22/20 0847 700 Units/hr at  02/22/20 0847  . hydrALAZINE (APRESOLINE) injection 5 mg  5 mg Intravenous Q20 Min PRN Dagoberto Ligas, PA-C      . hydrocortisone (ANUSOL-HC) suppository 25 mg  25 mg Rectal BID Dagoberto Ligas, PA-C   25 mg at 02/23/20 1045  . labetalol (NORMODYNE) injection 10 mg  10 mg Intravenous Q10 min PRN Dagoberto Ligas, PA-C      . levothyroxine (SYNTHROID) tablet 112 mcg  112 mcg Oral Q0600 Dagoberto Ligas, PA-C   112 mcg at 02/23/20 4268  . magnesium sulfate IVPB 2 g 50 mL  2 g Intravenous Daily PRN Dagoberto Ligas, PA-C      . metoprolol succinate (TOPROL-XL) 24 hr tablet 12.5 mg  12.5 mg Oral BID Dagoberto Ligas, PA-C   12.5 mg at 02/23/20 1044  . metoprolol tartrate (LOPRESSOR) injection 2-5 mg  2-5 mg Intravenous Q2H PRN Dagoberto Ligas, PA-C      . morphine 2 MG/ML injection 2 mg  2 mg Intravenous Q2H PRN Dagoberto Ligas, PA-C      . multivitamin with minerals tablet 1 tablet  1 tablet Oral Daily Dagoberto Ligas, PA-C   1 tablet at 02/23/20 1044  . ondansetron (ZOFRAN) injection 4 mg  4 mg Intravenous Q6H PRN Dagoberto Ligas, PA-C      . oxyCODONE-acetaminophen (PERCOCET/ROXICET) 5-325 MG per tablet 1-2 tablet  1-2 tablet Oral Q4H PRN Dagoberto Ligas, PA-C      . pantoprazole (PROTONIX) EC tablet 40 mg  40 mg Oral Daily Dagoberto Ligas, PA-C   40 mg at 02/23/20 1043  . phenol (CHLORASEPTIC) mouth spray 1 spray  1 spray Mouth/Throat PRN Dagoberto Ligas, PA-C      . polyethylene glycol (MIRALAX / GLYCOLAX) packet 17 g  17 g Oral Daily PRN Elam Dutch, MD      . potassium chloride SA (KLOR-CON) CR tablet 20-40 mEq  20-40 mEq Oral Once Dagoberto Ligas, PA-C      . potassium chloride SA (KLOR-CON) CR tablet 20-40 mEq  20-40 mEq Oral Daily PRN Dagoberto Ligas, PA-C      . sodium chloride flush (NS) 0.9 % injection 3 mL  3 mL Intravenous Q12H Dagoberto Ligas, PA-C   3 mL at 02/22/20 3419  . sodium chloride flush (NS) 0.9 % injection 3 mL  3 mL Intravenous PRN Dagoberto Ligas, PA-C       . triamterene-hydrochlorothiazide (MAXZIDE-25) 37.5-25 MG per tablet 1 tablet  1 tablet Oral Daily Dagoberto Ligas, PA-C   1 tablet at 02/23/20 1044     Discharge Medications: Please see discharge summary for a list of discharge medications.  Relevant Imaging Results:  Relevant Lab Results:   Additional Information SSN [id number]  Vinie Sill, LCSWA

## 2020-02-23 NOTE — TOC Progression Note (Signed)
Transition of Care [facility]) - Progression Note    Patient Details  Name: Christine Pugh MRN: [id number] Date of Birth: 03/25/1922  Transition of Care [facility]) CM/SW [place], [place] Phone Number: 02/23/2020, 4:37 PM  Clinical Narrative:     [name]- no availability - Attempted to call [facility]- unable to reach admissions- will have weekend CSW to follow up for possible bed offers.   Thurmond Butts, MSW, [place] Clinical Social Worker   Expected Discharge Plan: Skilled Nursing Facility Barriers to Discharge: Continued Medical Work up  Expected Discharge Plan and Services Expected Discharge Plan: [place] arrangements for the past 2 months: [place]                                       Social Determinants of Health (SDOH) Interventions    Readmission Risk Interventions No flowsheet data found.

## 2020-02-23 NOTE — Progress Notes (Addendum)
Progress Note    02/23/2020 6:57 AM 3 Days Post-Op  Subjective: No complaints.  Her left upper arm bandage was changed at approximately 2 AM last night.  Night shift nurse states there was a mild amount of oozing.   Vitals:   02/23/20 0020 02/23/20 0319  BP: (!) 101/61 (!) 118/57  Pulse:    Resp: 16 13  Temp: 98.4 F (36.9 C) 98.3 F (36.8 C)  SpO2: 97% 95%    Physical Exam: Cardiac: Heart rate and rhythm are regular Lungs: Clear to auscultation bilaterally Incisions: Right groin incision well approximated.  No signs of infection Extremities: Left upper arm bandages removed.  There is an approximately quarter size blood stain on the gauze at the superior aspect of her incision.  Minimal amount of serous drainage inferiorly.  Dopplerable radial, ulnar and brachial artery signals.  Motor function and sensation intact of the left hand.  5/5 hand grip strength   CBC    Component Value Date/Time   WBC 8.3 02/22/2020 0349   RBC 2.76 (L) 02/22/2020 0349   HGB 8.6 (L) 02/22/2020 0349   HCT 27.2 (L) 02/22/2020 0349   PLT 155 02/22/2020 0349   MCV 98.6 02/22/2020 0349   MCH 31.2 02/22/2020 0349   MCHC 31.6 02/22/2020 0349   RDW 13.8 02/22/2020 0349   LYMPHSABS 1.1 08/29/2016 0516   MONOABS 1.3 (H) 08/29/2016 0516   EOSABS 0.3 08/29/2016 0516   BASOSABS 0.0 08/29/2016 0516    BMET    Component Value Date/Time   NA 137 02/21/2020 0505   K 4.2 02/21/2020 0505   CL 104 02/21/2020 0505   CO2 24 02/21/2020 0505   GLUCOSE 135 (H) 02/21/2020 0505   BUN 13 02/21/2020 0505   CREATININE 1.05 (H) 02/21/2020 0505   CALCIUM 8.0 (L) 02/21/2020 0505   GFRNONAA 45 (L) 02/21/2020 0505   GFRAA 52 (L) 02/21/2020 0505     Intake/Output Summary (Last 24 hours) at 02/23/2020 0657 Last data filed at 02/23/2020 0000 Gross per 24 hour  Intake 359.72 ml  Output --  Net 359.72 ml    HOSPITAL MEDICATIONS Scheduled Meds: . sodium chloride   Intravenous Once  . amiodarone  100 mg Oral  Daily  . docusate sodium  100 mg Oral BID  . hydrocortisone  25 mg Rectal BID  . levothyroxine  112 mcg Oral Q0600  . metoprolol succinate  12.5 mg Oral BID  . multivitamin with minerals  1 tablet Oral Daily  . pantoprazole  40 mg Oral Daily  . potassium chloride  20-40 mEq Oral Once  . sodium chloride flush  3 mL Intravenous Q12H  . triamterene-hydrochlorothiazide  1 tablet Oral Daily   Continuous Infusions: . sodium chloride 250 mL (02/19/20 2100)  . sodium chloride    . heparin 700 Units/hr (02/22/20 0847)  . magnesium sulfate bolus IVPB     PRN Meds:.sodium chloride, sodium chloride, acetaminophen **OR** acetaminophen, alum & mag hydroxide-simeth, guaiFENesin-dextromethorphan, hydrALAZINE, labetalol, magnesium sulfate bolus IVPB, metoprolol tartrate, morphine injection, ondansetron, oxyCODONE-acetaminophen, phenol, potassium chloride, sodium chloride flush  Assessment: POD 3 86 y.o. female is s/p left brachial to ulnar artery bypass with right saphenous vein secondary to psa/ transection of brachial artery.  No significant bleeding last 24 hours.  Plan: -TOC team assessing SNF placement options. -Continue mobilization -Continue hep infusion>>transition to Eliquis at DC -Check CBC in am -DVT prophylaxis:  Heparin infusion   Risa Grill, PA-C [facility] [phone] 02/23/2020  6:57 AM  Agree with above.  Medically ready for D/c Wants SNF  Ruta Hinds, MD [facility] of [place] Office: [phone]

## 2020-02-23 NOTE — Progress Notes (Signed)
ANTICOAGULATION CONSULT NOTE   Pharmacy Consult for Heparin Indication: atrial fibrillation   No Known Allergies  Patient Measurements: Height: 5\' 3"  (160 cm) Weight: 63 kg (139 lb) IBW/kg (Calculated) : 52.4 Heparin Dosing Weight: 63  Vital Signs: Temp: 98.2 F (36.8 C) (07/23 0742) Temp Source: Oral (07/23 0742) BP: 106/53 (07/23 0742) Pulse Rate: 70 (07/23 0742)  Labs: Recent Labs    02/20/20 1502 02/20/20 1502 02/21/20 0505 02/21/20 1209 02/22/20 0349 02/23/20 0345  HGB 10.5*   < > 9.2*  --  8.6*  --   HCT 31.0*  --  27.7*  --  27.2*  --   PLT  --   --  173  --  155  --   APTT  --   --   --  60* 118*  --   HEPARINUNFRC  --   --   --  0.26* 0.47 0.33  CREATININE  --   --  1.05*  --   --   --    < > = values in this interval not displayed.    Estimated Creatinine Clearance: 28.1 mL/min (A) (by C-G formula based on SCr of 1.05 mg/dL (H)).   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 Pharmacy consulted to bridge from apixaban to heparin. S/p repair pseudoaneurysm in OR 7/20.  Heparin level therapeutic this AM HgB 8.6, mild oozing from bandage site  Goal of Therapy:  HL 0.3-0.7 units/ml Monitor platelets by anticoagulation protocol: Yes  Plan:  -Continue heparin at 700 units / hr -Daily heparin level, monitor H/H -Plan transition back to apixaban at d/c, planning SNF  Thank you Anette Guarneri, PharmD [phone]

## 2020-02-24 LAB — BPAM RBC
Blood Product Expiration Date: 202108202359
Blood Product Expiration Date: 202108202359
Unit Type and Rh: 5100
Unit Type and Rh: 5100

## 2020-02-24 LAB — CBC
HCT: 28.8 % — ABNORMAL LOW (ref 36.0–46.0)
Hemoglobin: 9 g/dL — ABNORMAL LOW (ref 12.0–15.0)
MCH: 30.7 pg (ref 26.0–34.0)
MCHC: 31.3 g/dL (ref 30.0–36.0)
MCV: 98.3 fL (ref 80.0–100.0)
Platelets: 182 10*3/uL (ref 150–400)
RBC: 2.93 MIL/uL — ABNORMAL LOW (ref 3.87–5.11)
RDW: 14 % (ref 11.5–15.5)
WBC: 7.6 10*3/uL (ref 4.0–10.5)
nRBC: 0 % (ref 0.0–0.2)

## 2020-02-24 LAB — TYPE AND SCREEN
ABO/RH(D): O POS
Antibody Screen: NEGATIVE
Unit division: 0
Unit division: 0

## 2020-02-24 LAB — HEPARIN LEVEL (UNFRACTIONATED): Heparin Unfractionated: 0.38 IU/mL (ref 0.30–0.70)

## 2020-02-24 NOTE — Progress Notes (Addendum)
Progress Note    02/24/2020 6:58 AM 4 Days Post-Op  Subjective:  No complaints. She says nursing staff just changed her bandage.   Vitals:   02/23/20 2338 02/24/20 0324  BP: 102/81 120/65  Pulse: 72 68  Resp: 18 16  Temp: 98.1 F (36.7 C) 98 F (36.7 C)  SpO2: 92% 94%    Physical Exam: Cardiac: Heart rate and rhythm are regular Lungs: Clear to auscultation bilaterally Extremities: Left upper arm incision was redressed.  Tiny amount of oozing at the superior aspect.  Her left hand is warm with intact motor function and sensation.  2+ radial pulse   CBC    Component Value Date/Time   WBC 7.6 02/24/2020 0254   RBC 2.93 (L) 02/24/2020 0254   HGB 9.0 (L) 02/24/2020 0254   HCT 28.8 (L) 02/24/2020 0254   PLT 182 02/24/2020 0254   MCV 98.3 02/24/2020 0254   MCH 30.7 02/24/2020 0254   MCHC 31.3 02/24/2020 0254   RDW 14.0 02/24/2020 0254   LYMPHSABS 1.1 08/29/2016 0516   MONOABS 1.3 (H) 08/29/2016 0516   EOSABS 0.3 08/29/2016 0516   BASOSABS 0.0 08/29/2016 0516    BMET    Component Value Date/Time   NA 137 02/21/2020 0505   K 4.2 02/21/2020 0505   CL 104 02/21/2020 0505   CO2 24 02/21/2020 0505   GLUCOSE 135 (H) 02/21/2020 0505   BUN 13 02/21/2020 0505   CREATININE 1.05 (H) 02/21/2020 0505   CALCIUM 8.0 (L) 02/21/2020 0505   GFRNONAA 45 (L) 02/21/2020 0505   GFRAA 52 (L) 02/21/2020 0505     Intake/Output Summary (Last 24 hours) at 02/24/2020 0658 Last data filed at 02/24/2020 0200 Gross per 24 hour  Intake 420.14 ml  Output 240 ml  Net 180.14 ml    HOSPITAL MEDICATIONS Scheduled Meds: . sodium chloride   Intravenous Once  . amiodarone  100 mg Oral Daily  . docusate sodium  100 mg Oral BID  . hydrocortisone  25 mg Rectal BID  . levothyroxine  112 mcg Oral Q0600  . metoprolol succinate  12.5 mg Oral BID  . multivitamin with minerals  1 tablet Oral Daily  . pantoprazole  40 mg Oral Daily  . potassium chloride  20-40 mEq Oral Once  . sodium chloride  flush  3 mL Intravenous Q12H  . triamterene-hydrochlorothiazide  1 tablet Oral Daily   Continuous Infusions: . sodium chloride 250 mL (02/19/20 2100)  . sodium chloride    . heparin 700 Units/hr (02/23/20 2134)  . magnesium sulfate bolus IVPB     PRN Meds:.sodium chloride, sodium chloride, acetaminophen **OR** acetaminophen, alum & mag hydroxide-simeth, guaiFENesin-dextromethorphan, hydrALAZINE, labetalol, magnesium sulfate bolus IVPB, metoprolol tartrate, morphine injection, ondansetron, oxyCODONE-acetaminophen, phenol, polyethylene glycol, potassium chloride, sodium chloride flush  Assessment: Status post left brachial artery repair with interposition saphenous vein graft.  She remains on heparin infusion with history of atrial fibrillation on Eliquis at home.  Her hemoglobin remained stable.  Vital signs are stable.    Plan: -Skilled nursing facility options being explored.  Will transition to back to Eliquis upon discharge.  Continue current treatment plan -DVT prophylaxis: Heparin infusion   Risa Grill, PA-C [facility] [phone] 02/24/2020  6:58 AM   I have seen and evaluated the patient. I agree with the PA note as documented above.  86 year old female status post left brachial artery pseudoaneurysm repair.  She has a palpable radial pulse at the wrist.  Continue heparin for history  of A. fib and transition to Eliquis at discharge.  Awaiting SNF placement and appreciate case management.  Hemoglobin today 9.0 and stable from 8.6 on 7/22.  Marty Heck, MD [facility] [place] Office: [phone]

## 2020-02-24 NOTE — Progress Notes (Signed)
[place] for Heparin Indication: atrial fibrillation   No Known Allergies  Patient Measurements: Height: 5\' 3"  (160 cm) Weight: 63 kg (139 lb) IBW/kg (Calculated) : 52.4 Heparin Dosing Weight: 63  Vital Signs: Temp: 98 F (36.7 C) (07/24 0324) Temp Source: Oral (07/24 0324) BP: 120/65 (07/24 0324) Pulse Rate: 68 (07/24 0324)  Labs: Recent Labs    02/21/20 1209 02/21/20 1209 02/22/20 0349 02/23/20 0345 02/24/20 0254  HGB  --   --  8.6*  --  9.0*  HCT  --   --  27.2*  --  28.8*  PLT  --   --  155  --  182  APTT 60*  --  118*  --   --   HEPARINUNFRC 0.26*   < > 0.47 0.33 0.38   < > = values in this interval not displayed.    Estimated Creatinine Clearance: 28.1 mL/min (A) (by C-G formula based on SCr of 1.05 mg/dL (H)).   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 Pharmacy consulted to bridge from apixaban to heparin. S/p repair pseudoaneurysm in OR 7/20.  Heparin level therapeutic again this AM Hgb 9.0, mild oozing from bandage site yesterday and today, improving  Goal of Therapy:  HL 0.3-0.7 units/ml Monitor platelets by anticoagulation protocol: Yes  Plan:  -Continue heparin at 700 units / hr -Daily heparin level, monitor H/H, s/sx bleeding -Will transition back to apixaban at d/c, continue to follow plans for SNF  Mercy Riding, PharmD PGY1 [place] Resident Please refer to [facility] for unit-specific pharmacist

## 2020-02-24 NOTE — TOC Progression Note (Addendum)
Transition of Care [facility]) - Progression Note    Patient Details  Name: Christine Pugh MRN: [id number] Date of Birth: [birth year]  Transition of Care [facility]) CM/SW [place], LCSW Phone Number: [phone] 02/24/2020, 3:26 PM  Clinical Narrative:     CSW reached out to patient's son to discuss bed offers; stated he was driving and will call CSW back; CSW awaiting return call.   3:59 pm, son returned call to discuss bed offers; chose [facility]. CSW called [place] to ascertain bed availability; had to leave message.   TOC team will continue to assist with discharge planning needs.   Expected Discharge Plan: [place] Barriers to Discharge: Continued Medical Work up  Expected Discharge Plan and Services Expected Discharge Plan: [place] arrangements for the past 2 months: [place]                                       Social Determinants of Health (SDOH) Interventions    Readmission Risk Interventions No flowsheet data found.

## 2020-02-25 LAB — HEPARIN LEVEL (UNFRACTIONATED): Heparin Unfractionated: 0.33 IU/mL (ref 0.30–0.70)

## 2020-02-25 MED ORDER — APIXABAN 5 MG PO TABS
5.0000 mg | ORAL_TABLET | Freq: Two times a day (BID) | ORAL | Status: DC
  Administered 2020-02-25 – 2020-02-27 (×5): 5 mg via ORAL
  Filled 2020-02-25 (×5): qty 1

## 2020-02-25 NOTE — Progress Notes (Signed)
Mobility Specialist: Progress Note    02/25/20 1322  Mobility  Activity Ambulated in room  Level of Assistance Minimal assist, patient does 75% or more  Assistive Device Four wheel walker  Distance Ambulated (ft) 30 ft  Mobility Response Tolerated fair  Mobility performed by Mobility specialist  Bed Position High-fowlers  $Mobility charge 1 Mobility   Pre-Mobility: 73 HR, 104/53 BP, 94% SpO2 Post-Mobility: 69 HR, 116/59 BP, 97% SpO2  Pt had to take a seated rest break halfway through walk.   [facility] Kristin Lamagna Mobility Specialist

## 2020-02-25 NOTE — Progress Notes (Signed)
[place] for Heparin Indication: atrial fibrillation   No Known Allergies  Patient Measurements: Height: 5\' 3"  (160 cm) Weight: 63 kg (139 lb) IBW/kg (Calculated) : 52.4 Heparin Dosing Weight: 63  Vital Signs: Temp: 98 F (36.7 C) (07/25 0720) Temp Source: Oral (07/25 0720) BP: 128/73 (07/25 0720) Pulse Rate: 64 (07/25 0720)  Labs: Recent Labs    02/23/20 0345 02/24/20 0254 02/25/20 0346  HGB  --  9.0*  --   HCT  --  28.8*  --   PLT  --  182  --   HEPARINUNFRC 0.33 0.38 0.33    Estimated Creatinine Clearance: 28.1 mL/min (A) (by C-G formula based on SCr of 1.05 mg/dL (H)).   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 Pharmacy consulted to bridge from apixaban to heparin. S/p repair pseudoaneurysm in OR 7/20.  Heparin level therapeutic again this AM at 0.33 7/24: Hgb 9.0, mild oozing from bandage site, now improved Awaiting SNF placement  Goal of Therapy:  HL 0.3-0.7 units/ml Monitor platelets by anticoagulation protocol: Yes  Plan:  -Continue heparin at 700 units / hr -Daily heparin level, monitor H/H, s/sx bleeding -Will transition back to apixaban at d/c, continue to follow plans for SNF  Mercy Riding, PharmD PGY1 [place] Resident Please refer to [facility] for unit-specific pharmacist

## 2020-02-25 NOTE — Progress Notes (Addendum)
Progress Note    02/25/2020 7:19 AM 5 Days Post-Op  Subjective:  Forearm swelling improved with elevation.  Only complain is urinary incontinence when OOB.   Vitals:   02/24/20 2315 02/25/20 0545  BP: 118/68 (!) 107/58  Pulse: 63 69  Resp: 21 17  Temp: 99.1 F (37.3 C) 98.4 F (36.9 C)  SpO2: 94% 93%    Physical Exam: Cardiac:  RRR Lungs:  CTAB Extremities:  Left upper arm dressing removed>>dry.  Staple line well approximated. 2+ radial pulse. 5/5 grip strength. Seroma of forearm from organizing hematoma. Upper extremity tissue planes are soft; no signs of infection      CBC    Component Value Date/Time   WBC 7.6 02/24/2020 0254   RBC 2.93 (L) 02/24/2020 0254   HGB 9.0 (L) 02/24/2020 0254   HCT 28.8 (L) 02/24/2020 0254   PLT 182 02/24/2020 0254   MCV 98.3 02/24/2020 0254   MCH 30.7 02/24/2020 0254   MCHC 31.3 02/24/2020 0254   RDW 14.0 02/24/2020 0254   LYMPHSABS 1.1 08/29/2016 0516   MONOABS 1.3 (H) 08/29/2016 0516   EOSABS 0.3 08/29/2016 0516   BASOSABS 0.0 08/29/2016 0516    BMET    Component Value Date/Time   NA 137 02/21/2020 0505   K 4.2 02/21/2020 0505   CL 104 02/21/2020 0505   CO2 24 02/21/2020 0505   GLUCOSE 135 (H) 02/21/2020 0505   BUN 13 02/21/2020 0505   CREATININE 1.05 (H) 02/21/2020 0505   CALCIUM 8.0 (L) 02/21/2020 0505   GFRNONAA 45 (L) 02/21/2020 0505   GFRAA 52 (L) 02/21/2020 0505     Intake/Output Summary (Last 24 hours) at 02/25/2020 0719 Last data filed at 02/25/2020 0500 Gross per 24 hour  Intake 600 ml  Output 1400 ml  Net -800 ml    HOSPITAL MEDICATIONS Scheduled Meds: . sodium chloride   Intravenous Once  . amiodarone  100 mg Oral Daily  . docusate sodium  100 mg Oral BID  . hydrocortisone  25 mg Rectal BID  . levothyroxine  112 mcg Oral Q0600  . metoprolol succinate  12.5 mg Oral BID  . multivitamin with minerals  1 tablet Oral Daily  . pantoprazole  40 mg Oral Daily  . potassium chloride  20-40 mEq Oral  Once  . sodium chloride flush  3 mL Intravenous Q12H  . triamterene-hydrochlorothiazide  1 tablet Oral Daily   Continuous Infusions: . sodium chloride 250 mL (02/19/20 2100)  . sodium chloride    . heparin 700 Units/hr (02/23/20 2134)  . magnesium sulfate bolus IVPB     PRN Meds:.sodium chloride, sodium chloride, acetaminophen **OR** acetaminophen, alum & mag hydroxide-simeth, guaiFENesin-dextromethorphan, hydrALAZINE, labetalol, magnesium sulfate bolus IVPB, metoprolol tartrate, morphine injection, ondansetron, oxyCODONE-acetaminophen, phenol, polyethylene glycol, potassium chloride, sodium chloride flush  Assessment: POD 5 Status post left brachial artery repair with interposition saphenous vein graft.  She remains on heparin infusion with history of atrial fibrillation on Eliquis at home.  Her hemoglobin/platelets remain stable. (No new labs today.) Vital signs are stable.     Plan: -Skilled nursing facility options being explored.  Awaiting call back today from [facility]. - Patient requesting to resume Eliquis now.   -DVT prophylaxis: Heparin infusion   Risa Grill, PA-C [facility] [phone] 02/25/2020  7:19 AM   I have seen and evaluated the patient. I agree with the PA note as documented above.  Status post left brachial pseudoaneurysm repair.  Left upper arm incision  looks great.  Palpable radial pulse.  Awaiting SNF placement.  She has asked that we DC her heparin so she can get to the bathroom quicker and start her home Eliquis which we will do.  Marty Heck, MD [facility] [place] Office: [phone]

## 2020-02-25 NOTE — Discharge Instructions (Signed)
Pseudoaneurysm  An aneurysm is a bulge in an artery. A pseudoaneurysm happens when an artery is injured and blood leaks out and forms a sac-like bulge in the surrounding tissues. What are the causes? The most common cause of this condition is a procedure called an angiogram. During this procedure, a small, thin tube (catheter) is inserted into an artery. After an angiogram, the insertion site on the artery should close back up all the way. If it does not, blood may leak out of the artery. Other causes of a pseudoaneurysm include:  Trauma to the walls of an artery, such as from a stabbing injury or a deep cut.  Bypass artery grafting surgery, which is a type of surgery that makes blood flow to the heart better.  An infection that affects the walls of an artery.  A heart attack (myocardial infarction). What are the signs or symptoms? Symptoms of this condition include:  Pain, soreness, or tenderness at the site of the pseudoaneurysm.  Swelling.  Bruising or a change in skin color.  A throbbing mass or lump at the site. How is this diagnosed? This condition may be diagnosed based on:  Your symptoms.  A physical exam.  An imaging test called a Doppler ultrasound. This imaging test uses sound waves to show the blood flow in the arteries and the pseudoaneurysm. How is this treated? This condition may go away on its own without treatment. To help prevent bleeding that cannot be controlled, or to help prevent other problems, your health care provider may suggest one of these treatments:  Injecting a blood-clotting enzyme, such as thrombin, into the site.  Fixing the artery with surgery.  Putting pressure (compression) on the pseudoaneurysm. Follow these instructions at home:  Take over-the-counter and prescription medicines only as told by your health care provider.  Return to your normal activities as told by your health care provider. Ask your health care provider what  activities are safe for you.  Keep all follow-up visits as told by your health care provider. This is important. Contact a health care provider if:  Your pain, soreness, or tenderness at the pseudoaneurysm site keeps getting worse.  You have swelling at the site. Get help right away if:  You have severe or ongoing (persistent) pain at the site of the pseudoaneurysm.  There is bleeding or drainage from the site.  The part of your body where the pseudoaneurysm is located changes color or becomes painful, cold, or numb.  You have chest pain or shortness of breath.  You feel like you might faint or you faint. Summary  A pseudoaneurysm happens when an artery is injured and blood leaks out to form a sac-like bulge.  The most common cause of this condition is a procedure called an angiogram in which a thin tube (catheter) is inserted into an artery.  This condition may go away on its own without treatment.  Take over-the-counter and prescription medicines only as told by your health care provider.  Get help right away if the part of your body where the pseudoaneurysm is located changes color or becomes painful, cold, or numb. This information is not intended to replace advice given to you by your health care provider. Make sure you discuss any questions you have with your health care provider. Document Revised: 04/27/2018 Document Reviewed: 04/27/2018 Elsevier Patient Education  2020 [place] on my medicine - ELIQUIS (apixaban)  This medication education was reviewed with me or my healthcare representative  as part of my discharge preparation.   Why was Eliquis prescribed for you? Eliquis was prescribed for you to reduce the risk of forming blood clots that can cause a stroke if you have a medical condition called atrial fibrillation (a type of irregular heartbeat) OR to reduce the risk of a blood clots forming after orthopedic surgery.  What do You need to  know about Eliquis ? Take your Eliquis TWICE DAILY - one tablet in the morning and one tablet in the evening with or without food.  It would be best to take the doses about the same time each day.  If you have difficulty swallowing the tablet whole please discuss with your pharmacist how to take the medication safely.  Take Eliquis exactly as prescribed by your doctor and DO NOT stop taking Eliquis without talking to the doctor who prescribed the medication.  Stopping may increase your risk of developing a new clot or stroke.  Refill your prescription before you run out.  After discharge, you should have regular check-up appointments with your healthcare provider that is prescribing your Eliquis.  In the future your dose may need to be changed if your kidney function or weight changes by a significant amount or as you get older.  What do you do if you miss a dose? If you miss a dose, take it as soon as you remember on the same day and resume taking twice daily.  Do not take more than one dose of ELIQUIS at the same time.  Important Safety Information A possible side effect of Eliquis is bleeding. You should call your healthcare provider right away if you experience any of the following: ? Bleeding from an injury or your nose that does not stop. ? Unusual colored urine (red or dark brown) or unusual colored stools (red or black). ? Unusual bruising for unknown reasons. ? A serious fall or if you hit your head (even if there is no bleeding).  Some medicines may interact with Eliquis and might increase your risk of bleeding or clotting while on Eliquis. To help avoid this, consult your healthcare provider or pharmacist prior to using any new prescription or non-prescription medications, including herbals, vitamins, non-steroidal anti-inflammatory drugs (NSAIDs) and supplements.  This website has more information on Eliquis (apixaban): [email or web address].no.

## 2020-02-26 LAB — SARS CORONAVIRUS 2 (TAT 6-24 HRS): SARS Coronavirus 2: NEGATIVE

## 2020-02-26 LAB — HEPARIN LEVEL (UNFRACTIONATED): Heparin Unfractionated: >2.2 IU/mL — ABNORMAL HIGH (ref 0.30–0.70)

## 2020-02-26 NOTE — TOC Progression Note (Addendum)
Transition of Care [facility]) - Progression Note    Patient Details  Name: Christine Pugh MRN: [id number] Date of Birth: Dec 26, 1921  Transition of Care [facility]) CM/SW [place], [place] Phone Number: 02/26/2020, 11:47 AM   12:37pm- [facility] confirmed bed offer- CSW requested covid test  Clinical Narrative:    Left voice message with [place] waiting on call back  Thurmond Butts, MSW, [employer] Clinical Social Worker    Expected Discharge Plan: Skilled Nursing Facility Barriers to Discharge: Continued Medical Work up  Expected Discharge Plan and Services Expected Discharge Plan: [place] arrangements for the past 2 months: [place]                                       Social Determinants of Health (SDOH) Interventions    Readmission Risk Interventions No flowsheet data found.

## 2020-02-26 NOTE — Progress Notes (Addendum)
  Progress Note    02/26/2020 7:02 AM 6 Days Post-Op  Subjective: No complaints this morning.  Denies hand pain or incisional soreness   Vitals:   02/25/20 2341 02/26/20 0325  BP: (!) 102/54 (!) 112/58  Pulse: 65 58  Resp: 16 16  Temp:  98.3 F (36.8 C)  SpO2: 96% 98%    Physical Exam: Cardiac: Heart rate and rhythm are regular Lungs: Clear to auscultation bilaterally Incisions: Left upper arm staple line is intact.  Small amount of serosanguineous drainage from inferior aspect.  No bleeding Extremities: Left upper extremity edema has resolved.  2+ radial pulse.  5 out of 5 right hand grip strength  CBC    Component Value Date/Time   WBC 7.6 02/24/2020 0254   RBC 2.93 (L) 02/24/2020 0254   HGB 9.0 (L) 02/24/2020 0254   HCT 28.8 (L) 02/24/2020 0254   PLT 182 02/24/2020 0254   MCV 98.3 02/24/2020 0254   MCH 30.7 02/24/2020 0254   MCHC 31.3 02/24/2020 0254   RDW 14.0 02/24/2020 0254   LYMPHSABS 1.1 08/29/2016 0516   MONOABS 1.3 (H) 08/29/2016 0516   EOSABS 0.3 08/29/2016 0516   BASOSABS 0.0 08/29/2016 0516    BMET    Component Value Date/Time   NA 137 02/21/2020 0505   K 4.2 02/21/2020 0505   CL 104 02/21/2020 0505   CO2 24 02/21/2020 0505   GLUCOSE 135 (H) 02/21/2020 0505   BUN 13 02/21/2020 0505   CREATININE 1.05 (H) 02/21/2020 0505   CALCIUM 8.0 (L) 02/21/2020 0505   GFRNONAA 45 (L) 02/21/2020 0505   GFRAA 52 (L) 02/21/2020 0505     Intake/Output Summary (Last 24 hours) at 02/26/2020 5638 Last data filed at 02/26/2020 0603 Gross per 24 hour  Intake 1240 ml  Output 300 ml  Net 940 ml    HOSPITAL MEDICATIONS Scheduled Meds: . sodium chloride   Intravenous Once  . amiodarone  100 mg Oral Daily  . apixaban  5 mg Oral BID  . docusate sodium  100 mg Oral BID  . hydrocortisone  25 mg Rectal BID  . levothyroxine  112 mcg Oral Q0600  . metoprolol succinate  12.5 mg Oral BID  . multivitamin with minerals  1 tablet Oral Daily  . pantoprazole  40 mg  Oral Daily  . sodium chloride flush  3 mL Intravenous Q12H  . triamterene-hydrochlorothiazide  1 tablet Oral Daily   Continuous Infusions: . sodium chloride 250 mL (02/19/20 2100)  . sodium chloride    . magnesium sulfate bolus IVPB     PRN Meds:.sodium chloride, sodium chloride, acetaminophen **OR** acetaminophen, alum & mag hydroxide-simeth, guaiFENesin-dextromethorphan, hydrALAZINE, labetalol, magnesium sulfate bolus IVPB, metoprolol tartrate, morphine injection, ondansetron, oxyCODONE-acetaminophen, phenol, polyethylene glycol, potassium chloride, sodium chloride flush  Assessment: POD 6 Status post left brachial artery repair with interposition saphenous vein graft.  Left hand well-perfused. Eliquis restarted yesterday. Vital signs are stable.     Plan: -Awaiting transition of care team update regarding SNF placement -DVT prophylaxis: Anticoagulated with Eliquis   Risa Grill, PA-C [facility] [phone] 02/26/2020  7:02 AM   2+ radial pulse Incision healing Waiting on SNF  Ruta Hinds, MD [facility] [place] Office: [phone]

## 2020-02-27 DIAGNOSIS — I480 Paroxysmal atrial fibrillation: Secondary | ICD-10-CM | POA: Diagnosis not present

## 2020-02-27 DIAGNOSIS — I639 Cerebral infarction, unspecified: Secondary | ICD-10-CM | POA: Diagnosis not present

## 2020-02-27 DIAGNOSIS — R279 Unspecified lack of coordination: Secondary | ICD-10-CM | POA: Diagnosis not present

## 2020-02-27 DIAGNOSIS — R55 Syncope and collapse: Secondary | ICD-10-CM | POA: Diagnosis not present

## 2020-02-27 DIAGNOSIS — I10 Essential (primary) hypertension: Secondary | ICD-10-CM | POA: Diagnosis not present

## 2020-02-27 DIAGNOSIS — I721 Aneurysm of artery of upper extremity: Secondary | ICD-10-CM | POA: Diagnosis not present

## 2020-02-27 DIAGNOSIS — Z743 Need for continuous supervision: Secondary | ICD-10-CM | POA: Diagnosis not present

## 2020-02-27 DIAGNOSIS — K649 Unspecified hemorrhoids: Secondary | ICD-10-CM | POA: Diagnosis not present

## 2020-02-27 DIAGNOSIS — R5381 Other malaise: Secondary | ICD-10-CM | POA: Diagnosis not present

## 2020-02-27 DIAGNOSIS — I6529 Occlusion and stenosis of unspecified carotid artery: Secondary | ICD-10-CM | POA: Diagnosis not present

## 2020-02-27 DIAGNOSIS — I4891 Unspecified atrial fibrillation: Secondary | ICD-10-CM | POA: Diagnosis not present

## 2020-02-27 DIAGNOSIS — E039 Hypothyroidism, unspecified: Secondary | ICD-10-CM | POA: Diagnosis not present

## 2020-02-27 DIAGNOSIS — R0902 Hypoxemia: Secondary | ICD-10-CM | POA: Diagnosis not present

## 2020-02-27 DIAGNOSIS — Z48812 Encounter for surgical aftercare following surgery on the circulatory system: Secondary | ICD-10-CM | POA: Diagnosis not present

## 2020-02-27 DIAGNOSIS — I70208 Unspecified atherosclerosis of native arteries of extremities, other extremity: Secondary | ICD-10-CM | POA: Diagnosis not present

## 2020-02-27 DIAGNOSIS — I471 Supraventricular tachycardia: Secondary | ICD-10-CM | POA: Diagnosis not present

## 2020-02-27 DIAGNOSIS — I350 Nonrheumatic aortic (valve) stenosis: Secondary | ICD-10-CM | POA: Diagnosis not present

## 2020-02-27 MED ORDER — HYDROCORTISONE ACETATE 25 MG RE SUPP: 25.0000 mg | Freq: Two times a day (BID) | RECTAL | 0 refills | Status: DC

## 2020-02-27 MED ORDER — ACETAMINOPHEN 325 MG RE SUPP: 325.0000 mg | Freq: Four times a day (QID) | RECTAL | Status: DC | PRN

## 2020-02-27 NOTE — Care Management Important Message (Signed)
Important Message  Patient Details  Name: Christine Pugh MRN: [id number] Date of Birth: 28-May-1922   Medicare Important Message Given:  Yes     Shelda Altes 02/27/2020, 10:05 AM

## 2020-02-27 NOTE — Discharge Summary (Signed)
Discharge Summary    Christine Pugh 02-21-1922 86 y.o. 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 female who noticed a lump in her left mid upper arm a few months ago.  This was not really bothersome to her initially.  However, over the past few weeks it has progressively enlarged in size.  She does not really have pain from this but is bothered by how rapidly it is increasing in size.  Of note she had a brachial embolectomy through radial and ulnar cutdowns in 2018.  She was noted to have a high brachial bifurcation at that time.  Review of the operative note does not show an arteriotomy in the brachial artery but certainly Fogarty catheters were passed up this.  The patient is on chronic Eliquis for atrial fibrillation.  She has had a previous embolic stroke from her A. fib as well.  Other medical problems include aortic stenosis, hypertension which have been stable.  Hospital Course:  The patient was admitted to the hospital and taken to the operating room on 02/20/2020 and underwent: Left brachial to ulnar artery bypass with reversed right greater saphenous vein    The pt tolerated the procedure well and was transported to the PACU in good condition.   Operative findings: High brachial bifurcation pseudoaneurysm cavity involving essentially transection of the brachial artery at the takeoff of ulnar and radial arteries. Reconstructed with a vein Graft to what appeared to be the ulnar artery.  POD 1, pt had weakly palpable left radial pulse but was asymptomatic.    POD 2, some bloody drainage overnight but no hematoma.  1+ left radial pulse. Sensory and motor in tact.   POD 3, pt lives alone and will await SNF options.    Over the course of  the rest of her hospital stay, left radial pulse stronger.  Motor and sensory in tact.  Heparin gtt was transitioned back to Eliquis.  Her covid test is negative.  Hgb has remained stable.   The remainder of the hospital course consisted of increasing mobilization and increasing intake of solids without difficulty.  CBC    Component Value Date/Time   WBC 7.6 02/24/2020 0254   RBC 2.93 (L) 02/24/2020 0254   HGB 9.0 (L) 02/24/2020 0254   HCT 28.8 (L) 02/24/2020 0254   PLT 182 02/24/2020 0254   MCV 98.3 02/24/2020 0254   MCH 30.7 02/24/2020 0254   MCHC 31.3 02/24/2020 0254   RDW 14.0 02/24/2020 0254   LYMPHSABS 1.1 08/29/2016 0516   MONOABS 1.3 (H) 08/29/2016 0516   EOSABS 0.3 08/29/2016 0516   BASOSABS 0.0 08/29/2016 0516    BMET    Component Value Date/Time   NA 137 02/21/2020 0505   K 4.2 02/21/2020 0505   CL 104 02/21/2020 0505   CO2 24 02/21/2020 0505   GLUCOSE 135 (H) 02/21/2020 0505   BUN 13 02/21/2020 0505   CREATININE 1.05 (H) 02/21/2020 0505   CALCIUM 8.0 (L) 02/21/2020 0505   GFRNONAA 45 (L) 02/21/2020 0505   GFRAA 52 (L) 02/21/2020 0505      Discharge Instructions    Discharge patient   Complete by: As directed    Discharge disposition: 03-Skilled Nursing Facility   Discharge patient date: 02/27/2020  Discharge Diagnosis:  Preop cardiovascular exam [Z01.810] 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 Aneurysm of left brachial artery ([place]) 02/17/2020  . Memory loss 11/26/2016  . PAF (paroxysmal atrial fibrillation) ([place]) 10/07/2016  . 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 Hemorrhoids 09/03/2016  . Embolic stroke involving middle cerebral artery ([place]) 08/28/2016  . Embolism of right middle cerebral artery 08/24/2016  . Thromboembolic stroke ([place]) 56/21/3086  . Ischemia of extremity 08/23/2016  . Syncope  01/06/2016  . Atypical atrial flutter ([place]) 10/28/2015  . Essential hypertension 10/28/2015  . Aortic valve disorder 05/19/2009  . SVT/ PSVT/ PAT 05/19/2009   Past Medical History:  Diagnosis Date  . Aortic insufficiency    Mild  . Aortic stenosis    Moderate to severe  . 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  s/p Left brachial, radial, ulnar, axillary embolectomy 08/2016.  Marland Kitchen Atypical atrial flutter ([place])   . Embolic stroke involving right cerebellar artery ([place])   . Essential hypertension   . Hypothyroidism   . Ischemia of extremity   . Memory loss 11/26/2016  . Moderate to severe aortic stenosis   . PAF (paroxysmal atrial fibrillation) ([place])   . Paroxysmal atrial tachycardia ([place])   . Stroke ([place]) 08/2016  . Syncope    a. s/p ILR 01/2016.     Allergies as of 02/27/2020   No Known Allergies     Medication List    TAKE these medications   acetaminophen 325 MG suppository Commonly known as: TYLENOL Place 1-2 suppositories (325-650 mg total) rectally every 6 (six) hours as needed (mild pain, headache, or temp >/= 101 F).   amiodarone 200 MG tablet Commonly known as: PACERONE TAKE 1/2 TABLET BY MOUTH DAILY   Eliquis 5 MG Tabs tablet Generic drug: apixaban TAKE 1 TABLET BY MOUTH TWICE A DAY   hydrocortisone 25 MG suppository Commonly known as: ANUSOL-HC Place 1 suppository (25 mg total) rectally 2 (two) times daily.   levothyroxine 112 MCG tablet Commonly known as: SYNTHROID Take 1 tablet (112 mcg total) by mouth daily before breakfast.   Lutein-Zeaxanthin 25-5 MG Caps Take 1 capsule by mouth 2 (two) times daily.   metoprolol succinate 25 MG 24 hr tablet Commonly known as: TOPROL-XL TAKE 0.5 TABLETS (12.5 MG TOTAL) BY MOUTH 2 (TWO) TIMES DAILY.   multivitamin tablet Take 1 tablet by mouth daily.   triamcinolone cream 0.1 % Commonly known as: KENALOG Apply 1 application topically daily as needed (irritated skin).     triamterene-hydrochlorothiazide 37.5-25 MG capsule Commonly known as: DYAZIDE Take 1 each (1 capsule total) by mouth daily.       Prescriptions given: No rx given.  Tylenol for pain  Instructions: 1. Wash incision daily with soap and water and pat dry  Disposition: SNF  Patient's condition: is Good  Follow up: 1. Dr. Oneida Alar in 2-3 weeks   Leontine Locket, PA-C [facility] [phone] 02/27/2020  7:32 AM

## 2020-02-27 NOTE — Plan of Care (Signed)
Problem: Education: Goal: Knowledge of General Education information will improve Description: Including pain rating scale, medication(s)/side effects and non-pharmacologic comfort measures 02/27/2020 0945 by Isabell Jarvis, RN Outcome: Adequate for Discharge 02/27/2020 0945 by Isabell Jarvis, RN Outcome: Adequate for Discharge   Problem: Education: Goal: Knowledge of General Education information will improve Description: Including pain rating scale, medication(s)/side effects and non-pharmacologic comfort measures 02/27/2020 0945 by Isabell Jarvis, RN Outcome: Adequate for Discharge 02/27/2020 0945 by Isabell Jarvis, RN Outcome: Adequate for Discharge   Problem: Health Behavior/Discharge Planning: Goal: Ability to manage health-related needs will improve 02/27/2020 0945 by Isabell Jarvis, RN Outcome: Adequate for Discharge 02/27/2020 0945 by Isabell Jarvis, RN Outcome: Adequate for Discharge   Problem: Clinical Measurements: Goal: Ability to maintain clinical measurements within normal limits will improve 02/27/2020 0945 by Isabell Jarvis, RN Outcome: Adequate for Discharge 02/27/2020 0945 by Isabell Jarvis, RN Outcome: Adequate for Discharge Goal: Will remain free from infection 02/27/2020 0945 by Isabell Jarvis, RN Outcome: Adequate for Discharge 02/27/2020 0945 by Isabell Jarvis, RN Outcome: Adequate for Discharge Goal: Diagnostic test results will improve 02/27/2020 0945 by Isabell Jarvis, RN Outcome: Adequate for Discharge 02/27/2020 0945 by Isabell Jarvis, RN Outcome: Adequate for Discharge Goal: Respiratory complications will improve 02/27/2020 0945 by Isabell Jarvis, RN Outcome: Adequate for Discharge 02/27/2020 0945 by Isabell Jarvis, RN Outcome: Adequate for Discharge Goal: Cardiovascular complication will be avoided 02/27/2020 0945 by Isabell Jarvis, RN Outcome: Adequate for Discharge 02/27/2020 0945 by Isabell Jarvis,  RN Outcome: Adequate for Discharge   Problem: Activity: Goal: Risk for activity intolerance will decrease 02/27/2020 0945 by Isabell Jarvis, RN Outcome: Adequate for Discharge 02/27/2020 0945 by Isabell Jarvis, RN Outcome: Adequate for Discharge   Problem: Nutrition: Goal: Adequate nutrition will be maintained 02/27/2020 0945 by Isabell Jarvis, RN Outcome: Adequate for Discharge 02/27/2020 0945 by Isabell Jarvis, RN Outcome: Adequate for Discharge   Problem: Coping: Goal: Level of anxiety will decrease 02/27/2020 0945 by Isabell Jarvis, RN Outcome: Adequate for Discharge 02/27/2020 0945 by Isabell Jarvis, RN Outcome: Adequate for Discharge   Problem: Elimination: Goal: Will not experience complications related to bowel motility 02/27/2020 0945 by Isabell Jarvis, RN Outcome: Adequate for Discharge 02/27/2020 0945 by Isabell Jarvis, RN Outcome: Adequate for Discharge Goal: Will not experience complications related to urinary retention 02/27/2020 0945 by Isabell Jarvis, RN Outcome: Adequate for Discharge 02/27/2020 0945 by Isabell Jarvis, RN Outcome: Adequate for Discharge   Problem: Pain Managment: Goal: General experience of comfort will improve 02/27/2020 0945 by Isabell Jarvis, RN Outcome: Adequate for Discharge 02/27/2020 0945 by Isabell Jarvis, RN Outcome: Adequate for Discharge   Problem: Safety: Goal: Ability to remain free from injury will improve 02/27/2020 0945 by Isabell Jarvis, RN Outcome: Adequate for Discharge 02/27/2020 0945 by Isabell Jarvis, RN Outcome: Adequate for Discharge   Problem: Skin Integrity: Goal: Risk for impaired skin integrity will decrease 02/27/2020 0945 by Isabell Jarvis, RN Outcome: Adequate for Discharge 02/27/2020 0945 by Isabell Jarvis, RN Outcome: Adequate for Discharge   Problem: Acute Rehab PT Goals(only PT should resolve) Goal: Patient Will Transfer Sit To/From Stand Outcome: Adequate  for Discharge Goal: Pt Will Ambulate Outcome: Adequate for Discharge   Problem: Acute Rehab OT Goals (only OT should resolve) Goal: Pt. Will Perform Lower Body Bathing Outcome: Adequate for Discharge Goal: Pt. Will Perform Lower Body Dressing  Outcome: Adequate for Discharge Goal: Pt. Will Transfer To Toilet Outcome: Adequate for Discharge Goal: OT Additional ADL Goal #1 Outcome: Adequate for Discharge

## 2020-02-27 NOTE — TOC Transition Note (Signed)
Transition of Care [facility]) - CM/SW Discharge Note   Patient Details  Name: Christine Pugh MRN: [id number] Date of Birth: Feb 24, 1922  Transition of Care [facility]) CM/SW Contact:  Vinie Sill, [place] Phone Number: 02/27/2020, 10:28 AM   Clinical Narrative:     Patient will DC to: [facility] Date: 02/27/2020 Family Notified: Vaughan Browner Transport By: Corey Harold @ 1pm   Per MD patient is ready for discharge. RN, patient, and facility notified of DC. Discharge Summary sent to facility. RN given number for [phone]. Ambulance transport requested for patient.   Clinical Social Worker signing off.  Thurmond Butts, MSW, [place] Clinical Social Worker    Final next level of care: Skilled Nursing Facility Barriers to Discharge: Barriers Resolved   Patient Goals and CMS Choice        Discharge Placement              Patient chooses bed at: [facility] Patient to be transferred to facility by: [place] Name of family member notified: Thayer Jew ,son Patient and family notified of of transfer: 02/27/20  Discharge Plan and Services                                     Social Determinants of Health (SDOH) Interventions     Readmission Risk Interventions No flowsheet data found.

## 2020-02-27 NOTE — Progress Notes (Signed)
Physical Therapy Treatment Patient Details Name: Christine Pugh MRN: [id number] DOB: Jan 02, 1922 Today's Date: 02/27/2020    History of Present Illness Pt is a 86 y.o. female admitted 02/17/20 with expanding left brachial artery pseudoaneurysm. S/p L brachial artery repair pseudoaneurysm leg vein harvest 7/20. PMH includes PAF, stroke, syncope, HTN, aortic stenosis, memory loss.    PT Comments    Pt very pleasant and eager to move. Pt reports she plans to D/C to [facility] today and stay for a week then spend time with her son and personal caregiver in [place] after that. Pt reports she normally walks limited household distance and does water aerobics regularly. Pt very motivated to return home and able to perform limited gait and HEP. D/c plan appropriate and will continue to follow.     Follow Up Recommendations  SNF;Supervision for mobility/OOB     Equipment Recommendations  None recommended by PT    Recommendations for Other Services       Precautions / Restrictions Precautions Precautions: Fall Precaution Comments: urinary incontinence Restrictions Weight Bearing Restrictions: Yes LUE Weight Bearing: Non weight bearing    Mobility  Bed Mobility   Bed Mobility: Supine to Sit     Supine to sit: Supervision;HOB elevated     General bed mobility comments: Increased time and effort, HOB 30 degrees with use of rail  Transfers Overall transfer level: Needs assistance   Transfers: Sit to/from Stand Sit to Stand: Min guard         General transfer comment: guarding for safety and stability  Ambulation/Gait Ambulation/Gait assistance: Min guard Gait Distance (Feet): 40 Feet Assistive device: 4-wheeled walker Gait Pattern/deviations: Step-through pattern;Decreased stride length;Trunk flexed   Gait velocity interpretation: <1.8 ft/sec, indicate of risk for recurrent falls General Gait Details: pt with trunk flexed, decreased speed and fatigue limiting function. cues  for direction and safety with pt with difficulty steering around equipment pt with decreased step length of left foot   Stairs             Wheelchair Mobility    Modified Rankin (Stroke Patients Only)       Balance Overall balance assessment: Needs assistance   Sitting balance-Leahy Scale: Fair Sitting balance - Comments: EOB with supervision   Standing balance support: Bilateral upper extremity supported;During functional activity Standing balance-Leahy Scale: Poor Standing balance comment: Reliant on UE support                            Cognition Arousal/Alertness: Awake/alert Behavior During Therapy: WFL for tasks assessed/performed Overall Cognitive Status: Impaired/Different from baseline Area of Impairment: Safety/judgement                         Safety/Judgement: Decreased awareness of deficits            Exercises General Exercises - Lower Extremity Long Arc Quad: AROM;Both;Seated;15 reps Hip ABduction/ADduction: AROM;Both;Seated;15 reps Hip Flexion/Marching: AROM;Both;Seated;15 reps    General Comments        Pertinent Vitals/Pain Pain Assessment: No/denies pain    Home Living                      Prior Function            PT Goals (current goals can now be found in the care plan section) Progress towards PT goals: Progressing toward goals    Frequency    Min 2X/week  PT Plan Current plan remains appropriate    Co-evaluation              AM-PAC PT "6 Clicks" Mobility   Outcome Measure  Help needed turning from your back to your side while in a flat bed without using bedrails?: A Little Help needed moving from lying on your back to sitting on the side of a flat bed without using bedrails?: A Little Help needed moving to and from a bed to a chair (including a wheelchair)?: A Little Help needed standing up from a chair using your arms (e.g., wheelchair or bedside chair)?: A Little Help  needed to walk in hospital room?: A Little Help needed climbing 3-5 steps with a railing? : A Lot 6 Click Score: 17    End of Session   Activity Tolerance: Patient tolerated treatment well Patient left: in chair;with call bell/phone within reach;with chair alarm set Nurse Communication: Mobility status PT Visit Diagnosis: Other abnormalities of gait and mobility (R26.89);Muscle weakness (generalized) (M62.81)     Time: 0355-9741 PT Time Calculation (min) (ACUTE ONLY): 20 min  Charges:  $Gait Training: 8-22 mins                     Bayard Males, PT Acute Rehabilitation Services Pager: [phone] Office: [place] 02/27/2020, 1:47 PM

## 2020-02-27 NOTE — Progress Notes (Signed)
  Progress Note    02/27/2020 7:26 AM 7 Days Post-Op  Subjective:  No complaints.  Afebrile HR 50's-70's 370'W-888'B systolic 16% RA  Vitals:   02/26/20 2316 02/27/20 0416  BP: (!) 120/63 (!) 105/58  Pulse: 65 55  Resp: 21 14  Temp: 98.2 F (36.8 C) 98.5 F (36.9 C)  SpO2: 99% 94%    Physical Exam: Cardiac:  regular Lungs:  Non labored Incisions:  Clean and dry Extremities:  Easily palpable left radial pulse.   CBC    Component Value Date/Time   WBC 7.6 02/24/2020 0254   RBC 2.93 (L) 02/24/2020 0254   HGB 9.0 (L) 02/24/2020 0254   HCT 28.8 (L) 02/24/2020 0254   PLT 182 02/24/2020 0254   MCV 98.3 02/24/2020 0254   MCH 30.7 02/24/2020 0254   MCHC 31.3 02/24/2020 0254   RDW 14.0 02/24/2020 0254   LYMPHSABS 1.1 08/29/2016 0516   MONOABS 1.3 (H) 08/29/2016 0516   EOSABS 0.3 08/29/2016 0516   BASOSABS 0.0 08/29/2016 0516    BMET    Component Value Date/Time   NA 137 02/21/2020 0505   K 4.2 02/21/2020 0505   CL 104 02/21/2020 0505   CO2 24 02/21/2020 0505   GLUCOSE 135 (H) 02/21/2020 0505   BUN 13 02/21/2020 0505   CREATININE 1.05 (H) 02/21/2020 0505   CALCIUM 8.0 (L) 02/21/2020 0505   GFRNONAA 45 (L) 02/21/2020 0505   GFRAA 52 (L) 02/21/2020 0505    INR    Component Value Date/Time   INR 1.1 02/20/2020 0308     Intake/Output Summary (Last 24 hours) at 02/27/2020 0726 Last data filed at 02/27/2020 0536 Gross per 24 hour  Intake 420 ml  Output 1300 ml  Net -880 ml     Assessment:  86 y.o. female is s/p:  left brachial artery repair with interposition saphenous vein graft.   7 Days Post-Op  Plan: -pt with palpable left radial pulse.   -incision looks good -DVT prophylaxis:  Eliquis restarted -pt's covid test negative-most likely to SNF today.   Leontine Locket, PA-C [facility] [phone] 02/27/2020 7:26 AM

## 2020-02-28 DIAGNOSIS — I70208 Unspecified atherosclerosis of native arteries of extremities, other extremity: Secondary | ICD-10-CM | POA: Diagnosis not present

## 2020-02-28 DIAGNOSIS — I639 Cerebral infarction, unspecified: Secondary | ICD-10-CM | POA: Diagnosis not present

## 2020-02-28 DIAGNOSIS — I471 Supraventricular tachycardia: Secondary | ICD-10-CM | POA: Diagnosis not present

## 2020-02-28 DIAGNOSIS — K649 Unspecified hemorrhoids: Secondary | ICD-10-CM | POA: Diagnosis not present

## 2020-02-28 DIAGNOSIS — I4891 Unspecified atrial fibrillation: Secondary | ICD-10-CM | POA: Diagnosis not present

## 2020-02-28 DIAGNOSIS — I10 Essential (primary) hypertension: Secondary | ICD-10-CM | POA: Diagnosis not present

## 2020-02-28 DIAGNOSIS — I6529 Occlusion and stenosis of unspecified carotid artery: Secondary | ICD-10-CM | POA: Diagnosis not present

## 2020-03-04 DIAGNOSIS — K649 Unspecified hemorrhoids: Secondary | ICD-10-CM | POA: Diagnosis not present

## 2020-03-04 DIAGNOSIS — I10 Essential (primary) hypertension: Secondary | ICD-10-CM | POA: Diagnosis not present

## 2020-03-04 DIAGNOSIS — I639 Cerebral infarction, unspecified: Secondary | ICD-10-CM | POA: Diagnosis not present

## 2020-03-04 DIAGNOSIS — I471 Supraventricular tachycardia: Secondary | ICD-10-CM | POA: Diagnosis not present

## 2020-03-04 DIAGNOSIS — I4891 Unspecified atrial fibrillation: Secondary | ICD-10-CM | POA: Diagnosis not present

## 2020-03-04 DIAGNOSIS — I70208 Unspecified atherosclerosis of native arteries of extremities, other extremity: Secondary | ICD-10-CM | POA: Diagnosis not present

## 2020-03-14 ENCOUNTER — Encounter: Payer: Self-pay | Admitting: Vascular Surgery

## 2020-03-14 ENCOUNTER — Other Ambulatory Visit: Payer: Self-pay

## 2020-03-14 ENCOUNTER — Ambulatory Visit (INDEPENDENT_AMBULATORY_CARE_PROVIDER_SITE_OTHER): Payer: Self-pay | Admitting: Vascular Surgery

## 2020-03-14 VITALS — BP 107/71 | HR 87 | Temp 97.2°F | Resp 20 | Ht 63.0 in | Wt 139.0 lb

## 2020-03-14 DIAGNOSIS — I721 Aneurysm of artery of upper extremity: Secondary | ICD-10-CM

## 2020-03-14 NOTE — Progress Notes (Signed)
Patient is a 86 year old female who returns for follow-up today.  She recently underwent repair of a left brachial artery pseudoaneurysm with an interposition vein graft from her saphenous vein.  She reports no incisional drainage.  She states her groin is well-healed.  She has no numbness and tingling in her left arm.  She wishes to return to the pool and start swimming again soon.  Physical exam:  Vitals:   03/14/20 1526  BP: 107/71  Pulse: 87  Resp: 20  Temp: (!) 97.2 F (36.2 C)  SpO2: 96%  Weight: 139 lb (63 kg)  Height: 5\' 3"  (1.6 m)    Extremities: 2+ left radial pulse well-healed left upper arm incision no drainage or erythema  Assessment: Doing well status post interposition graft repair of left brachial artery pseudoaneurysm  Plan: Staples were removed today.  Patient will not do any swimming for at least 1 more month.  She will follow-up in 6 months time in our APP clinic with a graft duplex of her left upper extremity.  Ruta Hinds, MD [facility] [place] Office: [phone]

## 2020-03-15 DIAGNOSIS — R55 Syncope and collapse: Secondary | ICD-10-CM | POA: Diagnosis not present

## 2020-03-15 DIAGNOSIS — Z48812 Encounter for surgical aftercare following surgery on the circulatory system: Secondary | ICD-10-CM | POA: Diagnosis not present

## 2020-03-15 DIAGNOSIS — I4892 Unspecified atrial flutter: Secondary | ICD-10-CM | POA: Diagnosis not present

## 2020-03-15 DIAGNOSIS — I721 Aneurysm of artery of upper extremity: Secondary | ICD-10-CM | POA: Diagnosis not present

## 2020-03-15 DIAGNOSIS — E039 Hypothyroidism, unspecified: Secondary | ICD-10-CM | POA: Diagnosis not present

## 2020-03-15 DIAGNOSIS — I10 Essential (primary) hypertension: Secondary | ICD-10-CM | POA: Diagnosis not present

## 2020-03-15 DIAGNOSIS — I480 Paroxysmal atrial fibrillation: Secondary | ICD-10-CM | POA: Diagnosis not present

## 2020-03-15 DIAGNOSIS — M1990 Unspecified osteoarthritis, unspecified site: Secondary | ICD-10-CM | POA: Diagnosis not present

## 2020-03-15 DIAGNOSIS — Z8673 Personal history of transient ischemic attack (TIA), and cerebral infarction without residual deficits: Secondary | ICD-10-CM | POA: Diagnosis not present

## 2020-03-15 DIAGNOSIS — Z7901 Long term (current) use of anticoagulants: Secondary | ICD-10-CM | POA: Diagnosis not present

## 2020-03-15 DIAGNOSIS — I350 Nonrheumatic aortic (valve) stenosis: Secondary | ICD-10-CM | POA: Diagnosis not present

## 2020-03-18 ENCOUNTER — Other Ambulatory Visit: Payer: Self-pay | Admitting: Cardiology

## 2020-03-18 DIAGNOSIS — I480 Paroxysmal atrial fibrillation: Secondary | ICD-10-CM | POA: Diagnosis not present

## 2020-03-18 DIAGNOSIS — I350 Nonrheumatic aortic (valve) stenosis: Secondary | ICD-10-CM | POA: Diagnosis not present

## 2020-03-18 DIAGNOSIS — Z48812 Encounter for surgical aftercare following surgery on the circulatory system: Secondary | ICD-10-CM | POA: Diagnosis not present

## 2020-03-18 DIAGNOSIS — M1990 Unspecified osteoarthritis, unspecified site: Secondary | ICD-10-CM | POA: Diagnosis not present

## 2020-03-18 DIAGNOSIS — I721 Aneurysm of artery of upper extremity: Secondary | ICD-10-CM | POA: Diagnosis not present

## 2020-03-18 DIAGNOSIS — I10 Essential (primary) hypertension: Secondary | ICD-10-CM | POA: Diagnosis not present

## 2020-03-19 ENCOUNTER — Other Ambulatory Visit: Payer: Self-pay | Admitting: *Deleted

## 2020-03-19 DIAGNOSIS — Z48812 Encounter for surgical aftercare following surgery on the circulatory system: Secondary | ICD-10-CM | POA: Diagnosis not present

## 2020-03-19 DIAGNOSIS — I10 Essential (primary) hypertension: Secondary | ICD-10-CM | POA: Diagnosis not present

## 2020-03-19 DIAGNOSIS — I480 Paroxysmal atrial fibrillation: Secondary | ICD-10-CM | POA: Diagnosis not present

## 2020-03-19 DIAGNOSIS — M1990 Unspecified osteoarthritis, unspecified site: Secondary | ICD-10-CM | POA: Diagnosis not present

## 2020-03-19 DIAGNOSIS — I721 Aneurysm of artery of upper extremity: Secondary | ICD-10-CM | POA: Diagnosis not present

## 2020-03-19 DIAGNOSIS — I350 Nonrheumatic aortic (valve) stenosis: Secondary | ICD-10-CM | POA: Diagnosis not present

## 2020-03-20 ENCOUNTER — Encounter (INDEPENDENT_AMBULATORY_CARE_PROVIDER_SITE_OTHER): Payer: Self-pay | Admitting: Ophthalmology

## 2020-03-20 ENCOUNTER — Other Ambulatory Visit: Payer: Self-pay

## 2020-03-20 ENCOUNTER — Ambulatory Visit (INDEPENDENT_AMBULATORY_CARE_PROVIDER_SITE_OTHER): Payer: Medicare Other | Admitting: Ophthalmology

## 2020-03-20 DIAGNOSIS — H353113 Nonexudative age-related macular degeneration, right eye, advanced atrophic without subfoveal involvement: Secondary | ICD-10-CM

## 2020-03-20 DIAGNOSIS — H353124 Nonexudative age-related macular degeneration, left eye, advanced atrophic with subfoveal involvement: Secondary | ICD-10-CM | POA: Insufficient documentation

## 2020-03-20 DIAGNOSIS — H3562 Retinal hemorrhage, left eye: Secondary | ICD-10-CM | POA: Diagnosis not present

## 2020-03-20 DIAGNOSIS — H43813 Vitreous degeneration, bilateral: Secondary | ICD-10-CM

## 2020-03-20 DIAGNOSIS — H353222 Exudative age-related macular degeneration, left eye, with inactive choroidal neovascularization: Secondary | ICD-10-CM

## 2020-03-20 NOTE — Assessment & Plan Note (Signed)
Paracentral scotoma likely from this condition still with good acuity

## 2020-03-20 NOTE — Assessment & Plan Note (Signed)

## 2020-03-20 NOTE — Progress Notes (Signed)
03/20/2020     CHIEF COMPLAINT Patient presents for Retina Follow Up   HISTORY OF PRESENT ILLNESS: Christine Pugh is a 86 y.o. female who presents to the clinic today for:   HPI    Retina Follow Up    Patient presents with  Dry AMD.  In both eyes.  This started 7 months ago.  Severity is mild.  Duration of 7 months.  Since onset it is stable.          Comments    7 Month AMD F/U OU  Pt c/o decreased near [place] OU. Pt sts she needs more light to be able to read. Pt reports occasional "red" VA OU upon waking. Pt denies ocular pain, flashes of light, or floaters OU.         Last edited by Rockie Neighbours, [place] on 03/20/2020  9:43 AM. (History)      Referring physician: Caryl Bis, MD [place],  [place] [address]  HISTORICAL INFORMATION:   Selected notes from the MEDICAL RECORD NUMBER    Lab Results  Component Value Date   HGBA1C 5.7 (H) 08/25/2016     CURRENT MEDICATIONS: No current outpatient medications on file. (Ophthalmic Drugs)   No current facility-administered medications for this visit. (Ophthalmic Drugs)   Current Outpatient Medications (Other)  Medication Sig   acetaminophen (TYLENOL) 325 MG suppository Place 1-2 suppositories (325-650 mg total) rectally every 6 (six) hours as needed (mild pain, headache, or temp >/= 101 F).   amiodarone (PACERONE) 200 MG tablet TAKE 1/2 TABLET BY MOUTH DAILY   ELIQUIS 5 MG TABS tablet TAKE 1 TABLET BY MOUTH TWICE A DAY   hydrocortisone (ANUSOL-HC) 25 MG suppository Place 1 suppository (25 mg total) rectally 2 (two) times daily.   levothyroxine (SYNTHROID, LEVOTHROID) 112 MCG tablet Take 1 tablet (112 mcg total) by mouth daily before breakfast.   Lutein-Zeaxanthin 25-5 MG CAPS Take 1 capsule by mouth 2 (two) times daily.   metoprolol succinate (TOPROL-XL) 25 MG 24 hr tablet TAKE 0.5 TABLETS (12.5 MG TOTAL) BY MOUTH 2 (TWO) TIMES DAILY.   Multiple Vitamin (MULTIVITAMIN) tablet Take 1 tablet by mouth daily.       triamcinolone cream (KENALOG) 0.1 % Apply 1 application topically daily as needed (irritated skin).    triamterene-hydrochlorothiazide (DYAZIDE) 37.5-25 MG capsule Take 1 each (1 capsule total) by mouth daily.   No current facility-administered medications for this visit. 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  s/p Left brachial, radial, ulnar, axillary embolectomy 08/2016.   Atypical atrial flutter (HCC)    Embolic stroke involving right cerebellar artery ([place])    Essential hypertension    Hypothyroidism    Ischemia of extremity    Memory loss 11/26/2016   Moderate to severe aortic stenosis    PAF (paroxysmal atrial fibrillation) (HCC)    Paroxysmal atrial tachycardia ([place])    Stroke ([place]) 08/2016   Syncope    a. s/p ILR 01/2016.   Past Surgical History:  Procedure Laterality Date   APPENDECTOMY     ARTERY REPAIR Left 02/20/2020   Procedure: LEFT BRACHIAL ARTERY PSEUDOANEURYSM REPAIR WITH LEFT BRACHIAL TO ULNAR BYPASS USING VEIN;  Surgeon: Oneida Alar,  Jessy Oto, MD;  Location: [facility];  Service: Vascular;  Laterality: Left;   BREAST LUMPECTOMY     dental implant     EP IMPLANTABLE DEVICE N/A 01/10/2016   Procedure: Loop Recorder Insertion;  Surgeon: [name], MD;  Location: [facility];  Service: Cardiovascular;  Laterality: N/A;   IR GENERIC HISTORICAL  08/23/2016   IR US GUIDE VASC ACCESS RIGHT 08/23/2016 Corrie Mckusick, DO [facility]   IR GENERIC HISTORICAL  08/23/2016   IR PERCUTANEOUS ART THROMBECTOMY/INFUSION INTRACRANIAL INC DIAG ANGIO 08/23/2016 Corrie Mckusick, DO [facility]   IR GENERIC HISTORICAL  08/23/2016   IR ANGIO INTRA EXTRACRAN SEL COM CAROTID INNOMINATE UNI L MOD SED 08/23/2016 Corrie Mckusick, DO [facility]    RADIOLOGY WITH ANESTHESIA Bilateral 08/23/2016   Procedure: RADIOLOGY WITH ANESTHESIA;  Surgeon: Medication Radiologist, MD;  Location: [facility];  Service: Radiology;  Laterality: Bilateral;   THROMBECTOMY BRACHIAL ARTERY Left 08/23/2016   Procedure: Left Radial, Ulnar, Brachial and Axillary Embolectomy;  Surgeon: Elam Dutch, MD;  Location: [facility] OR;  Service: Vascular;  Laterality: Left;   THYROIDECTOMY     TONSILLECTOMY     TOTAL ABDOMINAL HYSTERECTOMY     VEIN HARVEST Right 02/20/2020   Procedure: RIGHT LEG GREATER SAPHENOUS VEIN HARVEST;  Surgeon: Elam Dutch, MD;  Location: [facility];  Service: Vascular;  Laterality: Right;   VITRECTOMY AND CATARACT      FAMILY HISTORY Family History  Problem Relation Age of Onset   Coronary artery disease Other    Stroke Mother    Stroke Sister     SOCIAL HISTORY Social History   Tobacco Use   Smoking status: Never Smoker   Smokeless tobacco: Never Used  Vaping Use   Vaping Use: Never used  Substance Use Topics   Alcohol use: No    Alcohol/week: 0.0 standard drinks   Drug use: No         OPHTHALMIC EXAM:  Base Eye Exam    Visual Acuity (ETDRS)      Right Left   Dist [place] 20/40 -3 CF @ 3'   Dist ph [place] NI NI       Tonometry (Tonopen, 9:44 AM)      Right Left   Pressure 14 14       Pupils      Pupils Dark Light Shape React APD   Right PERRL 3 2 Round Slow None   Left PERRL 3 2 Round Slow None       Visual Fields (Counting fingers)      Left Right    Full Full       Extraocular Movement      Right Left    Full Full       Neuro/Psych    Oriented x3: Yes   Mood/Affect: Normal       Dilation    Both eyes: 1.0% Mydriacyl, 2.5% Phenylephrine @ 9:45 AM          IMAGING AND PROCEDURES  Imaging and Procedures for 03/20/20  OCT, Retina - OU - Both Eyes       Right Eye Quality was good. Scan locations included subfoveal. Central Foveal Thickness: 278. Progression has been stable. Findings  include abnormal foveal contour, subretinal scarring, no SRF, no IRF.   Left Eye Quality was good. Scan locations included subfoveal. Central Foveal Thickness: 316. Progression has been stable. Findings include abnormal foveal contour, no SRF, no IRF, subretinal scarring.   Notes No  signs of active CNVM in either eye.  We will continue to observe                ASSESSMENT/PLAN:  Advanced nonexudative age-related macular degeneration of left eye with subfoveal involvement The nature of dry age related macular degeneration was discussed with the patient as well as its possible conversion to wet. The results of the AREDS 2 study was discussed with the patient. A diet rich in dark leafy green vegetables was advised and specific recommendations were made regarding supplements with AREDS 2 formulation . Control of hypertension and serum cholesterol may slow the disease. Smoking cessation is mandatory to slow the disease and diminish the risk of progressing to wet age related macular degeneration. The patient was instructed in the use of an [place] and was told to return immediately for any changes in the Grid. Stressed to the patient do not rub eyes  Advanced nonexudative age-related macular degeneration of right eye without subfoveal involvement Paracentral scotoma likely from this condition still with good acuity      ICD-10-CM   1. Advanced nonexudative age-related macular degeneration of right eye without subfoveal involvement  H35.3113 OCT, Retina - OU - Both Eyes  2. Exudative age-related macular degeneration of left eye with inactive choroidal neovascularization (HCC)  H35.3222 OCT, Retina - OU - Both Eyes  3. Retinal hemorrhage of left eye  H35.62   4. Posterior vitreous detachment of both eyes  H43.813   5. Advanced nonexudative age-related macular degeneration of left eye with subfoveal involvement  H35.3124 OCT, Retina - OU - Both Eyes    1.  No signs of wet macular  degeneration or recurrent wet AMD either eye.  2.  Pression of geographic atrophy slowly in the right eye and yet stable  3.  Ophthalmic Meds Ordered this visit:  No orders of the defined types were placed in this encounter.      Return in about 9 months (around 12/18/2020) for DILATE OU, OCT.  There are no Patient Instructions on file for this visit.   Explained the diagnoses, plan, and follow up with the patient and they expressed understanding.  Patient expressed understanding of the importance of proper follow up care.   Clent Demark Dmya Long M.D. Diseases & Surgery of the Retina and Vitreous [facility] [place] 03/20/20     Abbreviations: M myopia (nearsighted); A astigmatism; H hyperopia (farsighted); P presbyopia; Mrx spectacle prescription;  CTL contact lenses; OD right eye; OS left eye; OU both eyes  XT exotropia; ET esotropia; PEK punctate epithelial keratitis; PEE punctate epithelial erosions; DES dry eye syndrome; MGD meibomian gland dysfunction; ATs artificial tears; PFAT's preservative free artificial tears; [place] nuclear sclerotic cataract; PSC posterior subcapsular cataract; ERM epi-retinal membrane; PVD posterior vitreous detachment; RD retinal detachment; DM diabetes mellitus; DR diabetic retinopathy; NPDR non-proliferative diabetic retinopathy; PDR proliferative diabetic retinopathy; CSME clinically significant macular edema; DME diabetic macular edema; dbh dot blot hemorrhages; CWS cotton wool spot; POAG primary open angle glaucoma; C/D cup-to-disc ratio; HVF humphrey visual field; GVF goldmann visual field; OCT optical coherence tomography; IOP intraocular pressure; BRVO Branch retinal vein occlusion; CRVO central retinal vein occlusion; CRAO central retinal artery occlusion; BRAO branch retinal artery occlusion; RT retinal tear; SB scleral buckle; PPV pars plana vitrectomy; VH Vitreous hemorrhage; PRP panretinal laser photocoagulation; IVK intravitreal kenalog; VMT  vitreomacular traction; MH Macular hole;  NVD neovascularization of the disc; NVE neovascularization elsewhere; AREDS age related eye disease study; ARMD age related macular  degeneration; POAG primary open angle glaucoma; EBMD epithelial/anterior basement membrane dystrophy; ACIOL anterior chamber intraocular lens; IOL intraocular lens; PCIOL posterior chamber intraocular lens; Phaco/IOL phacoemulsification with intraocular lens placement; [place] photorefractive keratectomy; LASIK laser assisted in situ keratomileusis; HTN hypertension; DM diabetes mellitus; COPD chronic obstructive pulmonary disease

## 2020-03-21 DIAGNOSIS — Z48812 Encounter for surgical aftercare following surgery on the circulatory system: Secondary | ICD-10-CM | POA: Diagnosis not present

## 2020-03-21 DIAGNOSIS — I10 Essential (primary) hypertension: Secondary | ICD-10-CM | POA: Diagnosis not present

## 2020-03-21 DIAGNOSIS — I721 Aneurysm of artery of upper extremity: Secondary | ICD-10-CM | POA: Diagnosis not present

## 2020-03-21 DIAGNOSIS — M1990 Unspecified osteoarthritis, unspecified site: Secondary | ICD-10-CM | POA: Diagnosis not present

## 2020-03-21 DIAGNOSIS — I480 Paroxysmal atrial fibrillation: Secondary | ICD-10-CM | POA: Diagnosis not present

## 2020-03-21 DIAGNOSIS — I350 Nonrheumatic aortic (valve) stenosis: Secondary | ICD-10-CM | POA: Diagnosis not present

## 2020-03-26 DIAGNOSIS — I721 Aneurysm of artery of upper extremity: Secondary | ICD-10-CM | POA: Diagnosis not present

## 2020-03-26 DIAGNOSIS — Z48812 Encounter for surgical aftercare following surgery on the circulatory system: Secondary | ICD-10-CM | POA: Diagnosis not present

## 2020-03-26 DIAGNOSIS — I480 Paroxysmal atrial fibrillation: Secondary | ICD-10-CM | POA: Diagnosis not present

## 2020-03-26 DIAGNOSIS — I10 Essential (primary) hypertension: Secondary | ICD-10-CM | POA: Diagnosis not present

## 2020-03-26 DIAGNOSIS — M1990 Unspecified osteoarthritis, unspecified site: Secondary | ICD-10-CM | POA: Diagnosis not present

## 2020-03-26 DIAGNOSIS — I350 Nonrheumatic aortic (valve) stenosis: Secondary | ICD-10-CM | POA: Diagnosis not present

## 2020-03-27 ENCOUNTER — Telehealth: Payer: Self-pay | Admitting: Cardiology

## 2020-03-27 MED ORDER — APIXABAN 5 MG PO TABS: 5.0000 mg | ORAL_TABLET | Freq: Two times a day (BID) | ORAL | 0 refills | Status: DC

## 2020-03-27 NOTE — Telephone Encounter (Signed)
Attempted to return call - no answer.   

## 2020-03-27 NOTE — Telephone Encounter (Signed)
Reports being in the donut hole. Advised that samples are available for pick up along with BMS patient assistance application. Advised to fill out patient section and bring back to office with proof of income and out of pocket prescription expense for 2021. Verbalized understanding of plan

## 2020-03-27 NOTE — Telephone Encounter (Signed)
Patient calling the office for samples of medication:   1.  What medication and dosage are you requesting samples for?  ELIQUIS   2.  Are you currently out of this medication? Yes

## 2020-03-28 DIAGNOSIS — I350 Nonrheumatic aortic (valve) stenosis: Secondary | ICD-10-CM | POA: Diagnosis not present

## 2020-03-28 DIAGNOSIS — I721 Aneurysm of artery of upper extremity: Secondary | ICD-10-CM | POA: Diagnosis not present

## 2020-03-28 DIAGNOSIS — M1990 Unspecified osteoarthritis, unspecified site: Secondary | ICD-10-CM | POA: Diagnosis not present

## 2020-03-28 DIAGNOSIS — Z48812 Encounter for surgical aftercare following surgery on the circulatory system: Secondary | ICD-10-CM | POA: Diagnosis not present

## 2020-03-28 DIAGNOSIS — I10 Essential (primary) hypertension: Secondary | ICD-10-CM | POA: Diagnosis not present

## 2020-03-28 DIAGNOSIS — I480 Paroxysmal atrial fibrillation: Secondary | ICD-10-CM | POA: Diagnosis not present

## 2020-04-02 DIAGNOSIS — M1990 Unspecified osteoarthritis, unspecified site: Secondary | ICD-10-CM | POA: Diagnosis not present

## 2020-04-02 DIAGNOSIS — Z48812 Encounter for surgical aftercare following surgery on the circulatory system: Secondary | ICD-10-CM | POA: Diagnosis not present

## 2020-04-02 DIAGNOSIS — I721 Aneurysm of artery of upper extremity: Secondary | ICD-10-CM | POA: Diagnosis not present

## 2020-04-02 DIAGNOSIS — I480 Paroxysmal atrial fibrillation: Secondary | ICD-10-CM | POA: Diagnosis not present

## 2020-04-02 DIAGNOSIS — I10 Essential (primary) hypertension: Secondary | ICD-10-CM | POA: Diagnosis not present

## 2020-04-02 DIAGNOSIS — I350 Nonrheumatic aortic (valve) stenosis: Secondary | ICD-10-CM | POA: Diagnosis not present

## 2020-04-03 DIAGNOSIS — I721 Aneurysm of artery of upper extremity: Secondary | ICD-10-CM | POA: Diagnosis not present

## 2020-04-03 DIAGNOSIS — Z6824 Body mass index (BMI) 24.0-24.9, adult: Secondary | ICD-10-CM | POA: Diagnosis not present

## 2020-04-04 DIAGNOSIS — I480 Paroxysmal atrial fibrillation: Secondary | ICD-10-CM | POA: Diagnosis not present

## 2020-04-04 DIAGNOSIS — I350 Nonrheumatic aortic (valve) stenosis: Secondary | ICD-10-CM | POA: Diagnosis not present

## 2020-04-04 DIAGNOSIS — M1990 Unspecified osteoarthritis, unspecified site: Secondary | ICD-10-CM | POA: Diagnosis not present

## 2020-04-04 DIAGNOSIS — I10 Essential (primary) hypertension: Secondary | ICD-10-CM | POA: Diagnosis not present

## 2020-04-04 DIAGNOSIS — Z48812 Encounter for surgical aftercare following surgery on the circulatory system: Secondary | ICD-10-CM | POA: Diagnosis not present

## 2020-04-04 DIAGNOSIS — I721 Aneurysm of artery of upper extremity: Secondary | ICD-10-CM | POA: Diagnosis not present

## 2020-04-05 DIAGNOSIS — Z48812 Encounter for surgical aftercare following surgery on the circulatory system: Secondary | ICD-10-CM | POA: Diagnosis not present

## 2020-04-05 DIAGNOSIS — I10 Essential (primary) hypertension: Secondary | ICD-10-CM | POA: Diagnosis not present

## 2020-04-05 DIAGNOSIS — I721 Aneurysm of artery of upper extremity: Secondary | ICD-10-CM | POA: Diagnosis not present

## 2020-04-05 DIAGNOSIS — I350 Nonrheumatic aortic (valve) stenosis: Secondary | ICD-10-CM | POA: Diagnosis not present

## 2020-04-05 DIAGNOSIS — M1990 Unspecified osteoarthritis, unspecified site: Secondary | ICD-10-CM | POA: Diagnosis not present

## 2020-04-05 DIAGNOSIS — I480 Paroxysmal atrial fibrillation: Secondary | ICD-10-CM | POA: Diagnosis not present

## 2020-04-09 ENCOUNTER — Other Ambulatory Visit: Payer: Self-pay | Admitting: *Deleted

## 2020-04-09 MED ORDER — APIXABAN 5 MG PO TABS: 5.0000 mg | ORAL_TABLET | Freq: Two times a day (BID) | ORAL | 2 refills | Status: DC

## 2020-04-11 DIAGNOSIS — I10 Essential (primary) hypertension: Secondary | ICD-10-CM | POA: Diagnosis not present

## 2020-04-11 DIAGNOSIS — I350 Nonrheumatic aortic (valve) stenosis: Secondary | ICD-10-CM | POA: Diagnosis not present

## 2020-04-11 DIAGNOSIS — I480 Paroxysmal atrial fibrillation: Secondary | ICD-10-CM | POA: Diagnosis not present

## 2020-04-11 DIAGNOSIS — Z48812 Encounter for surgical aftercare following surgery on the circulatory system: Secondary | ICD-10-CM | POA: Diagnosis not present

## 2020-04-11 DIAGNOSIS — M1990 Unspecified osteoarthritis, unspecified site: Secondary | ICD-10-CM | POA: Diagnosis not present

## 2020-04-11 DIAGNOSIS — I721 Aneurysm of artery of upper extremity: Secondary | ICD-10-CM | POA: Diagnosis not present

## 2020-04-14 DIAGNOSIS — I480 Paroxysmal atrial fibrillation: Secondary | ICD-10-CM | POA: Diagnosis not present

## 2020-04-14 DIAGNOSIS — R55 Syncope and collapse: Secondary | ICD-10-CM | POA: Diagnosis not present

## 2020-04-14 DIAGNOSIS — Z7901 Long term (current) use of anticoagulants: Secondary | ICD-10-CM | POA: Diagnosis not present

## 2020-04-14 DIAGNOSIS — I721 Aneurysm of artery of upper extremity: Secondary | ICD-10-CM | POA: Diagnosis not present

## 2020-04-14 DIAGNOSIS — Z8673 Personal history of transient ischemic attack (TIA), and cerebral infarction without residual deficits: Secondary | ICD-10-CM | POA: Diagnosis not present

## 2020-04-14 DIAGNOSIS — E039 Hypothyroidism, unspecified: Secondary | ICD-10-CM | POA: Diagnosis not present

## 2020-04-14 DIAGNOSIS — I10 Essential (primary) hypertension: Secondary | ICD-10-CM | POA: Diagnosis not present

## 2020-04-14 DIAGNOSIS — I350 Nonrheumatic aortic (valve) stenosis: Secondary | ICD-10-CM | POA: Diagnosis not present

## 2020-04-14 DIAGNOSIS — Z48812 Encounter for surgical aftercare following surgery on the circulatory system: Secondary | ICD-10-CM | POA: Diagnosis not present

## 2020-04-14 DIAGNOSIS — I4892 Unspecified atrial flutter: Secondary | ICD-10-CM | POA: Diagnosis not present

## 2020-04-14 DIAGNOSIS — M1990 Unspecified osteoarthritis, unspecified site: Secondary | ICD-10-CM | POA: Diagnosis not present

## 2020-04-17 ENCOUNTER — Encounter: Payer: Self-pay | Admitting: *Deleted

## 2020-04-17 NOTE — Progress Notes (Signed)
Notification received via fax on 04/16/2020 from [employer] [facility] that patient approved to receive eliquis assistance from 04/16/2020 - 08/02/2020

## 2020-04-18 DIAGNOSIS — I350 Nonrheumatic aortic (valve) stenosis: Secondary | ICD-10-CM | POA: Diagnosis not present

## 2020-04-18 DIAGNOSIS — I721 Aneurysm of artery of upper extremity: Secondary | ICD-10-CM | POA: Diagnosis not present

## 2020-04-18 DIAGNOSIS — Z48812 Encounter for surgical aftercare following surgery on the circulatory system: Secondary | ICD-10-CM | POA: Diagnosis not present

## 2020-04-18 DIAGNOSIS — I480 Paroxysmal atrial fibrillation: Secondary | ICD-10-CM | POA: Diagnosis not present

## 2020-04-18 DIAGNOSIS — M1990 Unspecified osteoarthritis, unspecified site: Secondary | ICD-10-CM | POA: Diagnosis not present

## 2020-04-18 DIAGNOSIS — I10 Essential (primary) hypertension: Secondary | ICD-10-CM | POA: Diagnosis not present

## 2020-04-30 DIAGNOSIS — Z48812 Encounter for surgical aftercare following surgery on the circulatory system: Secondary | ICD-10-CM | POA: Diagnosis not present

## 2020-04-30 DIAGNOSIS — I480 Paroxysmal atrial fibrillation: Secondary | ICD-10-CM | POA: Diagnosis not present

## 2020-04-30 DIAGNOSIS — I721 Aneurysm of artery of upper extremity: Secondary | ICD-10-CM | POA: Diagnosis not present

## 2020-04-30 DIAGNOSIS — M1990 Unspecified osteoarthritis, unspecified site: Secondary | ICD-10-CM | POA: Diagnosis not present

## 2020-04-30 DIAGNOSIS — I10 Essential (primary) hypertension: Secondary | ICD-10-CM | POA: Diagnosis not present

## 2020-04-30 DIAGNOSIS — I350 Nonrheumatic aortic (valve) stenosis: Secondary | ICD-10-CM | POA: Diagnosis not present

## 2020-04-30 DIAGNOSIS — Z23 Encounter for immunization: Secondary | ICD-10-CM | POA: Diagnosis not present

## 2020-05-09 DIAGNOSIS — Z48812 Encounter for surgical aftercare following surgery on the circulatory system: Secondary | ICD-10-CM | POA: Diagnosis not present

## 2020-05-09 DIAGNOSIS — I350 Nonrheumatic aortic (valve) stenosis: Secondary | ICD-10-CM | POA: Diagnosis not present

## 2020-05-09 DIAGNOSIS — I480 Paroxysmal atrial fibrillation: Secondary | ICD-10-CM | POA: Diagnosis not present

## 2020-05-09 DIAGNOSIS — I721 Aneurysm of artery of upper extremity: Secondary | ICD-10-CM | POA: Diagnosis not present

## 2020-05-09 DIAGNOSIS — I10 Essential (primary) hypertension: Secondary | ICD-10-CM | POA: Diagnosis not present

## 2020-05-09 DIAGNOSIS — M1990 Unspecified osteoarthritis, unspecified site: Secondary | ICD-10-CM | POA: Diagnosis not present

## 2020-05-27 DIAGNOSIS — E876 Hypokalemia: Secondary | ICD-10-CM | POA: Diagnosis not present

## 2020-05-27 DIAGNOSIS — E039 Hypothyroidism, unspecified: Secondary | ICD-10-CM | POA: Diagnosis not present

## 2020-05-27 DIAGNOSIS — I10 Essential (primary) hypertension: Secondary | ICD-10-CM | POA: Diagnosis not present

## 2020-05-27 DIAGNOSIS — N183 Chronic kidney disease, stage 3 unspecified: Secondary | ICD-10-CM | POA: Diagnosis not present

## 2020-05-27 DIAGNOSIS — R5383 Other fatigue: Secondary | ICD-10-CM | POA: Diagnosis not present

## 2020-05-27 DIAGNOSIS — E782 Mixed hyperlipidemia: Secondary | ICD-10-CM | POA: Diagnosis not present

## 2020-05-31 DIAGNOSIS — I8391 Asymptomatic varicose veins of right lower extremity: Secondary | ICD-10-CM | POA: Diagnosis not present

## 2020-05-31 DIAGNOSIS — Z6824 Body mass index (BMI) 24.0-24.9, adult: Secondary | ICD-10-CM | POA: Diagnosis not present

## 2020-05-31 DIAGNOSIS — I350 Nonrheumatic aortic (valve) stenosis: Secondary | ICD-10-CM | POA: Diagnosis not present

## 2020-05-31 DIAGNOSIS — N1831 Chronic kidney disease, stage 3a: Secondary | ICD-10-CM | POA: Diagnosis not present

## 2020-05-31 DIAGNOSIS — I10 Essential (primary) hypertension: Secondary | ICD-10-CM | POA: Diagnosis not present

## 2020-05-31 DIAGNOSIS — E782 Mixed hyperlipidemia: Secondary | ICD-10-CM | POA: Diagnosis not present

## 2020-05-31 DIAGNOSIS — D692 Other nonthrombocytopenic purpura: Secondary | ICD-10-CM | POA: Diagnosis not present

## 2020-06-16 ENCOUNTER — Other Ambulatory Visit: Payer: Self-pay | Admitting: Cardiology

## 2020-06-18 DIAGNOSIS — Z23 Encounter for immunization: Secondary | ICD-10-CM | POA: Diagnosis not present

## 2020-07-11 ENCOUNTER — Ambulatory Visit: Payer: Medicare Other | Admitting: Cardiology

## 2020-08-03 ENCOUNTER — Other Ambulatory Visit: Payer: Self-pay

## 2020-08-03 ENCOUNTER — Encounter (HOSPITAL_COMMUNITY): Payer: Self-pay | Admitting: *Deleted

## 2020-08-03 ENCOUNTER — Emergency Department (HOSPITAL_COMMUNITY)
Admission: EM | Admit: 2020-08-03 | Discharge: 2020-08-03 | Disposition: A | Discharge status: Home / Self Care | Payer: Medicare Other | Attending: Emergency Medicine | Admitting: Emergency Medicine

## 2020-08-03 DIAGNOSIS — I480 Paroxysmal atrial fibrillation: Secondary | ICD-10-CM | POA: Insufficient documentation

## 2020-08-03 DIAGNOSIS — R52 Pain, unspecified: Secondary | ICD-10-CM | POA: Diagnosis not present

## 2020-08-03 DIAGNOSIS — M545 Low back pain, unspecified: Secondary | ICD-10-CM | POA: Diagnosis not present

## 2020-08-03 DIAGNOSIS — I10 Essential (primary) hypertension: Secondary | ICD-10-CM | POA: Insufficient documentation

## 2020-08-03 DIAGNOSIS — Z8673 Personal history of transient ischemic attack (TIA), and cerebral infarction without residual deficits: Secondary | ICD-10-CM | POA: Insufficient documentation

## 2020-08-03 DIAGNOSIS — M549 Dorsalgia, unspecified: Secondary | ICD-10-CM | POA: Diagnosis not present

## 2020-08-03 DIAGNOSIS — E039 Hypothyroidism, unspecified: Secondary | ICD-10-CM | POA: Insufficient documentation

## 2020-08-03 DIAGNOSIS — M5441 Lumbago with sciatica, right side: Secondary | ICD-10-CM | POA: Diagnosis not present

## 2020-08-03 MED ORDER — LIDOCAINE 5 % EX PTCH
1.0000 | MEDICATED_PATCH | CUTANEOUS | 0 refills | Status: DC
Start: 2020-08-03 — End: 2022-12-11

## 2020-08-03 MED ORDER — METHOCARBAMOL 500 MG PO TABS
500.0000 mg | ORAL_TABLET | Freq: Two times a day (BID) | ORAL | 0 refills | Status: DC | PRN
Start: 2020-08-03 — End: 2021-07-23

## 2020-08-03 NOTE — ED Notes (Signed)
Pt wheeled to lobby to wait for son. Son on the way

## 2020-08-03 NOTE — ED Notes (Signed)
Called son to see if he can pick up pt. He states he takes care of his alzheimer's wife and cannot leave her. [place] Spaniel he will try to call someone to get her.

## 2020-08-03 NOTE — Discharge Instructions (Addendum)
You were evaluated in the Emergency Department and after careful evaluation, we did not find any emergent condition requiring admission or further testing in the hospital.  Your exam/testing today was overall reassuring.  Your symptoms seem to be due to sciatica.  We recommend Tylenol 1000 mg every 4-6 hours.  We also recommend the Lidoderm patches as directed.  For pain keeping you from sleeping at night you can use the Robaxin muscle relaxer but please use caution as this medication can cause drowsiness.  Please return to the Emergency Department if you experience any worsening of your condition.  Thank you for allowing [place] to be a part of your care.

## 2020-08-03 NOTE — ED Triage Notes (Signed)
Pt with lower back pain since last night.  Fell 2 weeks ago and no issues at that time.  Pain radiates down right leg

## 2020-08-03 NOTE — ED Provider Notes (Signed)
AP-EMERGENCY DEPT [facility] Emergency Department Provider Note MRN:  [id number]  Arrival date & time: 08/03/20     Chief Complaint   Back Pain   History of Present Illness   Christine Pugh is a 87 y.o. year-old female with a history of stroke, hypertension presenting to the ED with chief complaint of back pain.  Patient fell 2 weeks ago, and now for the past few days has had some worsening lower right sided back pain with some radiation into the right buttocks and back of the right leg.  Denies fever, no numbness or weakness to the arms or legs, no bowel or bladder dysfunction, no other traumatic injuries or complaints.  Review of Systems  A complete 10 system review of systems was obtained and all systems are negative except as noted in the HPI and PMH.   Patient's Health History    Past Medical History:  Diagnosis Date  . Aortic insufficiency    Mild  . Aortic stenosis    Moderate to severe  . 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  s/p Left brachial, radial, ulnar, axillary embolectomy 08/2016.  Marland Kitchen Atypical atrial flutter (HCC)   . 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 Essential hypertension   . Hypothyroidism   . Ischemia of extremity   . Memory loss 11/26/2016  . Moderate to severe aortic stenosis   . PAF (paroxysmal atrial fibrillation) (HCC)   . Paroxysmal atrial tachycardia (HCC)   . Stroke (HCC) 08/2016  . Syncope    a. s/p ILR 01/2016.    Past Surgical History:  Procedure Laterality Date  . APPENDECTOMY    . ARTERY REPAIR Left 02/20/2020   Procedure: LEFT BRACHIAL ARTERY PSEUDOANEURYSM REPAIR WITH LEFT BRACHIAL TO ULNAR BYPASS USING VEIN;  Surgeon: Sherren Kerns, MD;  Location: [facility] OR;  Service: Vascular;  Laterality: Left;  . BREAST LUMPECTOMY    . dental implant    . EP IMPLANTABLE DEVICE N/A 01/10/2016   Procedure: Loop Recorder Insertion;  Surgeon: Hillis Range, MD;  Location: [facility] INVASIVE CV LAB;  Service:  Cardiovascular;  Laterality: N/A;  . IR GENERIC HISTORICAL  08/23/2016   IR US GUIDE VASC ACCESS RIGHT 08/23/2016 Gilmer Mor, DO [facility]INTER[facility]  . IR GENERIC HISTORICAL  08/23/2016   IR PERCUTANEOUS ART THROMBECTOMY/INFUSION INTRACRANIAL INC DIAG ANGIO 08/23/2016 Gilmer Mor, DO [facility]  . IR GENERIC HISTORICAL  08/23/2016   IR ANGIO INTRA EXTRACRAN SEL COM CAROTID INNOMINATE UNI L MOD SED 08/23/2016 Gilmer Mor, DO [facility]  . RADIOLOGY WITH ANESTHESIA Bilateral 08/23/2016   Procedure: RADIOLOGY WITH ANESTHESIA;  Surgeon: Medication Radiologist, MD;  Location: [facility] OR;  Service: Radiology;  Laterality: Bilateral;  . THROMBECTOMY BRACHIAL ARTERY Left 08/23/2016   Procedure: Left Radial, Ulnar, Brachial and Axillary Embolectomy;  Surgeon: Sherren Kerns, MD;  Location: [facility] OR;  Service: Vascular;  Laterality: Left;  . THYROIDECTOMY    . TONSILLECTOMY    . TOTAL ABDOMINAL HYSTERECTOMY    . VEIN HARVEST Right 02/20/2020   Procedure: RIGHT LEG GREATER SAPHENOUS VEIN HARVEST;  Surgeon: Sherren Kerns, MD;  Location: [facility] OR;  Service: Vascular;  Laterality: Right;  . VITRECTOMY AND CATARACT      Family History  Problem Relation Age of Onset  . Coronary artery disease Other   . Stroke Mother   . Stroke Sister     Social History   Socioeconomic History  . Marital status: Widowed  Spouse name: Not on file  . Number of children: Not on file  . Years of education: Not on file  . Highest education level: Not on file  Occupational History  . Occupation: Retired  Tobacco Use  . Smoking status: Never Smoker  . Smokeless tobacco: Never Used  Vaping Use  . Vaping Use: Never used  Substance and Sexual Activity  . Alcohol use: No    Alcohol/week: 0.0 standard drinks  . Drug use: No  . Sexual activity: Not on file  Other Topics Concern  . Not on file  Social History Narrative   Pt lives in [place] alone.   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) Strain: Not on [employer] Insecurity: Not on file  Transportation Needs: Not on file  Physical Activity: Not on file  Stress: Not on file  Social Connections: Not on file  Intimate Partner Violence: Not on file     Physical Exam   Vitals:   08/03/20 1913  BP: 119/90  Pulse: (!) 117  Resp: 18  Temp: 97.8 F (36.6 C)  SpO2: 96%    CONSTITUTIONAL: Chronically ill-appearing, NAD NEURO:  Alert and oriented x 3, no focal deficits EYES:  eyes equal and reactive ENT/NECK:  no LAD, no JVD CARDIO: Regular rate, well-perfused, normal S1 and S2 PULM:  CTAB no wheezing or rhonchi GI/GU:  normal bowel sounds, non-distended, non-tender MSK/SPINE:  No gross deformities, no edema, mild tenderness to the right lumbar back, no midline tenderness SKIN:  no rash, atraumatic PSYCH:  Appropriate speech and behavior  *Additional and/or pertinent findings included in MDM below  Diagnostic and Interventional Summary    EKG Interpretation  Date/Time:    Ventricular Rate:    PR Interval:    QRS Duration:   QT Interval:    QTC Calculation:   R Axis:     Text Interpretation:        Labs Reviewed - No data to display  No orders to display    Medications - No data to display   Procedures  /  Critical Care Procedures  ED Course and Medical Decision Making  I have reviewed the triage vital signs, the nursing notes, and pertinent available records from the EMR.  Listed above are laboratory and imaging tests that I personally ordered, reviewed, and interpreted and then considered in my medical decision making (see below for details).  Patient simply here for better pain control, has been taking Tylenol without significant relief.  X-ray offered to evaluate for compression fracture, but patient does not want an x-ray at this time.  Given that she has no symptoms to suggest myelopathy, even if we find a compression fracture I do not feel it would change management at this time.  Will  provide numbing patches, muscle relaxers.  Appropriate for discharge.       Elmer Sow. Pilar Plate, MD [facility] Emergency Medicine [facility] [email or web address]  Final Clinical Impressions(s) / ED Diagnoses     ICD-10-CM   1. Acute right-sided low back pain with right-sided sciatica  M54.41     ED Discharge Orders         Ordered    lidocaine (LIDODERM) 5 %  Every 24 hours        08/03/20 2234    methocarbamol (ROBAXIN) 500 MG tablet  2 times daily PRN        08/03/20 2234  Discharge Instructions Discussed with and Provided to Patient:     Discharge Instructions     You were evaluated in the Emergency Department and after careful evaluation, we did not find any emergent condition requiring admission or further testing in the hospital.  Your exam/testing today was overall reassuring.  Your symptoms seem to be due to sciatica.  We recommend Tylenol 1000 mg every 4-6 hours.  We also recommend the Lidoderm patches as directed.  For pain keeping you from sleeping at night you can use the Robaxin muscle relaxer but please use caution as this medication can cause drowsiness.  Please return to the Emergency Department if you experience any worsening of your condition.  Thank you for allowing [place] to be a part of your care.       Maudie Flakes, MD 08/03/20 2237

## 2020-08-06 DIAGNOSIS — M545 Low back pain, unspecified: Secondary | ICD-10-CM | POA: Diagnosis not present

## 2020-08-06 DIAGNOSIS — M25551 Pain in right hip: Secondary | ICD-10-CM | POA: Diagnosis not present

## 2020-08-12 DIAGNOSIS — M25551 Pain in right hip: Secondary | ICD-10-CM | POA: Diagnosis not present

## 2020-08-12 DIAGNOSIS — Z7901 Long term (current) use of anticoagulants: Secondary | ICD-10-CM | POA: Diagnosis not present

## 2020-08-12 DIAGNOSIS — I10 Essential (primary) hypertension: Secondary | ICD-10-CM | POA: Diagnosis not present

## 2020-08-12 DIAGNOSIS — M5441 Lumbago with sciatica, right side: Secondary | ICD-10-CM | POA: Diagnosis not present

## 2020-08-12 DIAGNOSIS — Z9181 History of falling: Secondary | ICD-10-CM | POA: Diagnosis not present

## 2020-08-12 DIAGNOSIS — I480 Paroxysmal atrial fibrillation: Secondary | ICD-10-CM | POA: Diagnosis not present

## 2020-08-12 DIAGNOSIS — Z8673 Personal history of transient ischemic attack (TIA), and cerebral infarction without residual deficits: Secondary | ICD-10-CM | POA: Diagnosis not present

## 2020-08-12 DIAGNOSIS — I484 Atypical atrial flutter: Secondary | ICD-10-CM | POA: Diagnosis not present

## 2020-08-12 DIAGNOSIS — I350 Nonrheumatic aortic (valve) stenosis: Secondary | ICD-10-CM | POA: Diagnosis not present

## 2020-08-13 ENCOUNTER — Ambulatory Visit: Payer: Medicare Other | Admitting: Cardiology

## 2020-08-15 DIAGNOSIS — I484 Atypical atrial flutter: Secondary | ICD-10-CM | POA: Diagnosis not present

## 2020-08-15 DIAGNOSIS — M25551 Pain in right hip: Secondary | ICD-10-CM | POA: Diagnosis not present

## 2020-08-15 DIAGNOSIS — I350 Nonrheumatic aortic (valve) stenosis: Secondary | ICD-10-CM | POA: Diagnosis not present

## 2020-08-15 DIAGNOSIS — I480 Paroxysmal atrial fibrillation: Secondary | ICD-10-CM | POA: Diagnosis not present

## 2020-08-15 DIAGNOSIS — I10 Essential (primary) hypertension: Secondary | ICD-10-CM | POA: Diagnosis not present

## 2020-08-15 DIAGNOSIS — M5441 Lumbago with sciatica, right side: Secondary | ICD-10-CM | POA: Diagnosis not present

## 2020-08-20 ENCOUNTER — Ambulatory Visit: Payer: Medicare Other | Admitting: Cardiology

## 2020-08-22 DIAGNOSIS — M5441 Lumbago with sciatica, right side: Secondary | ICD-10-CM | POA: Diagnosis not present

## 2020-08-22 DIAGNOSIS — I350 Nonrheumatic aortic (valve) stenosis: Secondary | ICD-10-CM | POA: Diagnosis not present

## 2020-08-22 DIAGNOSIS — I10 Essential (primary) hypertension: Secondary | ICD-10-CM | POA: Diagnosis not present

## 2020-08-22 DIAGNOSIS — M25551 Pain in right hip: Secondary | ICD-10-CM | POA: Diagnosis not present

## 2020-08-22 DIAGNOSIS — I480 Paroxysmal atrial fibrillation: Secondary | ICD-10-CM | POA: Diagnosis not present

## 2020-08-22 DIAGNOSIS — I484 Atypical atrial flutter: Secondary | ICD-10-CM | POA: Diagnosis not present

## 2020-08-23 DIAGNOSIS — M5441 Lumbago with sciatica, right side: Secondary | ICD-10-CM | POA: Diagnosis not present

## 2020-08-23 DIAGNOSIS — I484 Atypical atrial flutter: Secondary | ICD-10-CM | POA: Diagnosis not present

## 2020-08-23 DIAGNOSIS — I350 Nonrheumatic aortic (valve) stenosis: Secondary | ICD-10-CM | POA: Diagnosis not present

## 2020-08-23 DIAGNOSIS — I480 Paroxysmal atrial fibrillation: Secondary | ICD-10-CM | POA: Diagnosis not present

## 2020-08-23 DIAGNOSIS — I10 Essential (primary) hypertension: Secondary | ICD-10-CM | POA: Diagnosis not present

## 2020-08-23 DIAGNOSIS — M25551 Pain in right hip: Secondary | ICD-10-CM | POA: Diagnosis not present

## 2020-08-27 DIAGNOSIS — M5441 Lumbago with sciatica, right side: Secondary | ICD-10-CM | POA: Diagnosis not present

## 2020-08-27 DIAGNOSIS — I10 Essential (primary) hypertension: Secondary | ICD-10-CM | POA: Diagnosis not present

## 2020-08-27 DIAGNOSIS — I350 Nonrheumatic aortic (valve) stenosis: Secondary | ICD-10-CM | POA: Diagnosis not present

## 2020-08-27 DIAGNOSIS — I480 Paroxysmal atrial fibrillation: Secondary | ICD-10-CM | POA: Diagnosis not present

## 2020-08-27 DIAGNOSIS — M25551 Pain in right hip: Secondary | ICD-10-CM | POA: Diagnosis not present

## 2020-08-27 DIAGNOSIS — I484 Atypical atrial flutter: Secondary | ICD-10-CM | POA: Diagnosis not present

## 2020-08-29 DIAGNOSIS — M5441 Lumbago with sciatica, right side: Secondary | ICD-10-CM | POA: Diagnosis not present

## 2020-08-29 DIAGNOSIS — I484 Atypical atrial flutter: Secondary | ICD-10-CM | POA: Diagnosis not present

## 2020-08-29 DIAGNOSIS — M25551 Pain in right hip: Secondary | ICD-10-CM | POA: Diagnosis not present

## 2020-08-29 DIAGNOSIS — I480 Paroxysmal atrial fibrillation: Secondary | ICD-10-CM | POA: Diagnosis not present

## 2020-08-29 DIAGNOSIS — I10 Essential (primary) hypertension: Secondary | ICD-10-CM | POA: Diagnosis not present

## 2020-08-29 DIAGNOSIS — I350 Nonrheumatic aortic (valve) stenosis: Secondary | ICD-10-CM | POA: Diagnosis not present

## 2020-09-03 DIAGNOSIS — I10 Essential (primary) hypertension: Secondary | ICD-10-CM | POA: Diagnosis not present

## 2020-09-03 DIAGNOSIS — I480 Paroxysmal atrial fibrillation: Secondary | ICD-10-CM | POA: Diagnosis not present

## 2020-09-03 DIAGNOSIS — M25551 Pain in right hip: Secondary | ICD-10-CM | POA: Diagnosis not present

## 2020-09-03 DIAGNOSIS — I484 Atypical atrial flutter: Secondary | ICD-10-CM | POA: Diagnosis not present

## 2020-09-03 DIAGNOSIS — I350 Nonrheumatic aortic (valve) stenosis: Secondary | ICD-10-CM | POA: Diagnosis not present

## 2020-09-03 DIAGNOSIS — M5441 Lumbago with sciatica, right side: Secondary | ICD-10-CM | POA: Diagnosis not present

## 2020-09-05 DIAGNOSIS — I484 Atypical atrial flutter: Secondary | ICD-10-CM | POA: Diagnosis not present

## 2020-09-05 DIAGNOSIS — I480 Paroxysmal atrial fibrillation: Secondary | ICD-10-CM | POA: Diagnosis not present

## 2020-09-05 DIAGNOSIS — M25551 Pain in right hip: Secondary | ICD-10-CM | POA: Diagnosis not present

## 2020-09-05 DIAGNOSIS — I10 Essential (primary) hypertension: Secondary | ICD-10-CM | POA: Diagnosis not present

## 2020-09-05 DIAGNOSIS — M5441 Lumbago with sciatica, right side: Secondary | ICD-10-CM | POA: Diagnosis not present

## 2020-09-05 DIAGNOSIS — I350 Nonrheumatic aortic (valve) stenosis: Secondary | ICD-10-CM | POA: Diagnosis not present

## 2020-09-11 DIAGNOSIS — M25551 Pain in right hip: Secondary | ICD-10-CM | POA: Diagnosis not present

## 2020-09-11 DIAGNOSIS — M5441 Lumbago with sciatica, right side: Secondary | ICD-10-CM | POA: Diagnosis not present

## 2020-09-11 DIAGNOSIS — Z9181 History of falling: Secondary | ICD-10-CM | POA: Diagnosis not present

## 2020-09-11 DIAGNOSIS — I350 Nonrheumatic aortic (valve) stenosis: Secondary | ICD-10-CM | POA: Diagnosis not present

## 2020-09-11 DIAGNOSIS — I484 Atypical atrial flutter: Secondary | ICD-10-CM | POA: Diagnosis not present

## 2020-09-11 DIAGNOSIS — Z8673 Personal history of transient ischemic attack (TIA), and cerebral infarction without residual deficits: Secondary | ICD-10-CM | POA: Diagnosis not present

## 2020-09-11 DIAGNOSIS — I10 Essential (primary) hypertension: Secondary | ICD-10-CM | POA: Diagnosis not present

## 2020-09-11 DIAGNOSIS — I480 Paroxysmal atrial fibrillation: Secondary | ICD-10-CM | POA: Diagnosis not present

## 2020-09-11 DIAGNOSIS — Z7901 Long term (current) use of anticoagulants: Secondary | ICD-10-CM | POA: Diagnosis not present

## 2020-09-12 ENCOUNTER — Encounter: Payer: Self-pay | Admitting: Cardiology

## 2020-09-12 ENCOUNTER — Telehealth (INDEPENDENT_AMBULATORY_CARE_PROVIDER_SITE_OTHER): Payer: Medicare Other | Admitting: Cardiology

## 2020-09-12 ENCOUNTER — Encounter: Payer: Self-pay | Admitting: *Deleted

## 2020-09-12 VITALS — BP 110/74 | HR 70 | Ht 63.0 in | Wt 139.0 lb

## 2020-09-12 DIAGNOSIS — I350 Nonrheumatic aortic (valve) stenosis: Secondary | ICD-10-CM | POA: Diagnosis not present

## 2020-09-12 DIAGNOSIS — M5441 Lumbago with sciatica, right side: Secondary | ICD-10-CM | POA: Diagnosis not present

## 2020-09-12 DIAGNOSIS — M25551 Pain in right hip: Secondary | ICD-10-CM | POA: Diagnosis not present

## 2020-09-12 DIAGNOSIS — I480 Paroxysmal atrial fibrillation: Secondary | ICD-10-CM

## 2020-09-12 DIAGNOSIS — I10 Essential (primary) hypertension: Secondary | ICD-10-CM | POA: Diagnosis not present

## 2020-09-12 DIAGNOSIS — I484 Atypical atrial flutter: Secondary | ICD-10-CM | POA: Diagnosis not present

## 2020-09-12 NOTE — Progress Notes (Signed)
Virtual Visit via Telephone Note   This visit type was conducted due to national recommendations for restrictions regarding the COVID-19 Pandemic (e.g. social distancing) in an effort to limit this patient's exposure and mitigate transmission in our community.  Due to her co-morbid illnesses, this patient is at least at moderate risk for complications without adequate follow up.  This format is felt to be most appropriate for this patient at this time.  The patient did not have access to video technology/had technical difficulties with video requiring transitioning to audio format only (telephone).  All issues noted in this document were discussed and addressed.  No physical exam could be performed with this format.  Please refer to the patient's chart for her  consent to telehealth for [facility].    Date:  09/12/2020   ID:  Christine Pugh, DOB 1922-04-06, MRN [id number] The patient was identified using 2 identifiers.  Patient Location: Home Provider Location: Office/Clinic  PCP:  Caryl Bis, MD  Cardiologist:  Rozann Lesches, MD  Electrophysiologist:  None   Evaluation Performed:  Follow-Up Visit  Chief Complaint:  Cardiac follow-up  History of Present Illness:    Christine Pugh is a 87 y.o. female last seen in June 2021.  We spoke by phone today.  Assistant was present with her.  She has been living with her son since January.  She tells me that she had a fall 2 weeks prior to Christmas and ultimately was diagnosed with a pelvic fracture. She is having PT at home and trying to improve her mobility.  She has not been back in the pool in quite some time.  She does not describe any frank syncope, no reported angina symptoms with low level activity.  I reviewed her medications which are unchanged from a cardiac perspective and outlined below.  Plan to request interval lab work from [facility].  Vital signs stable as noted below.   Past Medical History:  Diagnosis Date  .  Aortic insufficiency    Mild  . Aortic stenosis    Moderate to severe  . 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  s/p Left brachial, radial, ulnar, axillary embolectomy 08/2016.  Marland Kitchen Atypical atrial flutter ([place])   . Embolic stroke involving right cerebellar artery ([place])   . Essential hypertension   . Hypothyroidism   . Ischemia of extremity   . Memory loss 11/26/2016  . Moderate to severe aortic stenosis   . PAF (paroxysmal atrial fibrillation) ([place])   . Paroxysmal atrial tachycardia ([place])   . Stroke ([place]) 08/2016  . Syncope    a. s/p ILR 01/2016.   Past Surgical History:  Procedure Laterality Date  . APPENDECTOMY    . ARTERY REPAIR Left 02/20/2020   Procedure: LEFT BRACHIAL ARTERY PSEUDOANEURYSM REPAIR WITH LEFT BRACHIAL TO ULNAR BYPASS USING VEIN;  Surgeon: Elam Dutch, MD;  Location: [place];  Service: Vascular;  Laterality: Left;  . BREAST LUMPECTOMY    . dental implant    . EP IMPLANTABLE DEVICE N/A 01/10/2016   Procedure: Loop Recorder Insertion;  Surgeon: [name], MD;  Location: [place] CV LAB;  Service: Cardiovascular;  Laterality: N/A;  . IR GENERIC HISTORICAL  08/23/2016   IR US GUIDE VASC ACCESS RIGHT 08/23/2016 Corrie Mckusick, DO [facility]  . IR GENERIC HISTORICAL  08/23/2016   IR PERCUTANEOUS ART THROMBECTOMY/INFUSION INTRACRANIAL INC DIAG ANGIO 08/23/2016 Corrie Mckusick, DO [facility]  . IR GENERIC HISTORICAL  08/23/2016  IR ANGIO INTRA EXTRACRAN SEL COM CAROTID INNOMINATE UNI L MOD SED 08/23/2016 Corrie Mckusick, DO [facility]  . RADIOLOGY WITH ANESTHESIA Bilateral 08/23/2016   Procedure: RADIOLOGY WITH ANESTHESIA;  Surgeon: Medication Radiologist, MD;  Location: [place];  Service: Radiology;  Laterality: Bilateral;  . THROMBECTOMY BRACHIAL ARTERY Left 08/23/2016   Procedure: Left Radial, Ulnar, Brachial and Axillary Embolectomy;  Surgeon: Elam Dutch, MD;  Location: [place];  Service: Vascular;  Laterality: Left;  . THYROIDECTOMY    .  TONSILLECTOMY    . TOTAL ABDOMINAL HYSTERECTOMY    . VEIN HARVEST Right 02/20/2020   Procedure: RIGHT LEG GREATER SAPHENOUS VEIN HARVEST;  Surgeon: Elam Dutch, MD;  Location: [place];  Service: Vascular;  Laterality: Right;  . VITRECTOMY AND CATARACT       Current Meds  Medication Sig  . acetaminophen (TYLENOL) 325 MG suppository Place 1-2 suppositories (325-650 mg total) rectally every 6 (six) hours as needed (mild pain, headache, or temp >/= 101 F).  Marland Kitchen amiodarone (PACERONE) 200 MG tablet TAKE 1/2 TABLET BY MOUTH DAILY  . apixaban (ELIQUIS) 5 MG TABS tablet Take 1 tablet (5 mg total) by mouth 2 (two) times daily.  Marland Kitchen HYDROcodone-acetaminophen (NORCO/VICODIN) 5-325 MG tablet Take 1 tablet by mouth every 6 (six) hours as needed for moderate pain.  . hydrocortisone (ANUSOL-HC) 25 MG suppository Place 1 suppository (25 mg total) rectally 2 (two) times daily.  Marland Kitchen levothyroxine (SYNTHROID, LEVOTHROID) 112 MCG tablet Take 1 tablet (112 mcg total) by mouth daily before breakfast.  . lidocaine (LIDODERM) 5 % Place 1 patch onto the skin daily. Remove & Discard patch within 12 hours or as directed by MD  . Lutein-Zeaxanthin 25-5 MG CAPS Take 1 capsule by mouth 2 (two) times daily.  . methocarbamol (ROBAXIN) 500 MG tablet Take 1 tablet (500 mg total) by mouth 2 (two) times daily as needed for muscle spasms.  . metoprolol succinate (TOPROL-XL) 25 MG 24 hr tablet TAKE 0.5 TABLETS (12.5 MG TOTAL) BY MOUTH 2 (TWO) TIMES DAILY.  . Multiple Vitamin (MULTIVITAMIN) tablet Take 1 tablet by mouth daily.    Marland Kitchen triamcinolone cream (KENALOG) 0.1 % Apply 1 application topically daily as needed (irritated skin).   Marland Kitchen triamterene-hydrochlorothiazide (DYAZIDE) 37.5-25 MG capsule Take 1 each (1 capsule total) by mouth daily.     Allergies:   Patient has no known allergies.    ROS: No frank syncope.  Prior CV studies:   The following studies were reviewed today:  Echocardiogram 08/24/2016: Study Conclusions  -  Left ventricle: The cavity size was normal. There was mild concentric hypertrophy. Systolic function was vigorous. The estimated ejection fraction was in the range of 65% to 70%. Wall motion was normal; there were no regional wall motion abnormalities. Features are consistent with a pseudonormal left ventricular filling pattern, with concomitant abnormal relaxation and increased filling pressure (grade 2 diastolic dysfunction). Doppler parameters are consistent with elevated ventricular end-diastolic filling pressure. - Aortic valve: There was moderate stenosis. There was mild regurgitation. Mean gradient (S): 44 mm Hg. Peak gradient (S): 74 mm Hg. Valve area (VTI): 0.64 cm^2. Valve area (Vmax): 0.59 cm^2. Valve area (Vmean): 0.65 cm^2. - Aortic root: The aortic root was normal in size. - Mitral valve: There was mild regurgitation. - Left atrium: The atrium was moderately dilated. - Right ventricle: Systolic function was normal. - Tricuspid valve: There was mild regurgitation. - Pulmonary arteries: Systolic pressure was mildly increased. PA peak pressure: 33 mm Hg (S). - Inferior  vena cava: The vessel was normal in size. - Pericardium, extracardiac: There was no pericardial effusion.  Impressions:  - Severe aortic stenosis.  Labs/Other Tests and Data Reviewed:    EKG:  An ECG dated 02/17/2020 was personally reviewed today and demonstrated:  Sinus rhythm with PAC, left anterior fascicular block, nonspecific T wave changes.  Recent Labs: 02/21/2020: BUN 13; Creatinine, Ser 1.05; Potassium 4.2; Sodium 137 02/24/2020: Hemoglobin 9.0; Platelets 182   Recent Lipid Panel Lab Results  Component Value Date/Time   CHOL 164 08/25/2016 03:31 AM   TRIG 69 08/25/2016 03:31 AM   HDL 56 08/25/2016 03:31 AM   CHOLHDL 2.9 08/25/2016 03:31 AM   LDLCALC 94 08/25/2016 03:31 AM    Wt Readings from Last 3 Encounters:  09/12/20 139 lb (63 kg)  08/03/20 140 lb (63.5 kg)   03/14/20 139 lb (63 kg)     Objective:    Vital Signs:  BP 110/74   Pulse 70   Ht 5\' 3"  (1.6 m)   Wt 139 lb (63 kg)   SpO2 100%   BMI 24.62 kg/m    Patient spoke in full sentences, not obviously short of breath on the phone.  ASSESSMENT & PLAN:    1.  Paroxysmal atrial fibrillation/flutter.  She does not report any progressive palpitations and heart rate is normal today.  Plan to continue amiodarone and Eliquis as tolerated.  Requesting interval lab work from [facility].  Check ECG for next office visit.  2.  Severe low gradient aortic stenosis with mild aortic regurgitation.  She has declined TAVR evaluation and we are following this conservatively at this point.   Time:   Today, I have spent 8 minutes with the patient with telehealth technology discussing the above problems.     Medication Adjustments/Labs and Tests Ordered: Current medicines are reviewed at length with the patient today.  Concerns regarding medicines are outlined above.   Tests Ordered: No orders of the defined types were placed in this encounter.   Medication Changes: No orders of the defined types were placed in this encounter.   Follow Up:  In Person 6 months in the [place] office.  Signed, Rozann Lesches, MD  09/12/2020 11:28 AM    [place]

## 2020-09-12 NOTE — Patient Instructions (Signed)

## 2020-09-13 ENCOUNTER — Ambulatory Visit: Payer: Medicare Other

## 2020-09-13 ENCOUNTER — Other Ambulatory Visit (HOSPITAL_COMMUNITY): Payer: Medicare Other

## 2020-09-17 DIAGNOSIS — I480 Paroxysmal atrial fibrillation: Secondary | ICD-10-CM | POA: Diagnosis not present

## 2020-09-17 DIAGNOSIS — M5441 Lumbago with sciatica, right side: Secondary | ICD-10-CM | POA: Diagnosis not present

## 2020-09-17 DIAGNOSIS — I10 Essential (primary) hypertension: Secondary | ICD-10-CM | POA: Diagnosis not present

## 2020-09-17 DIAGNOSIS — M25551 Pain in right hip: Secondary | ICD-10-CM | POA: Diagnosis not present

## 2020-09-17 DIAGNOSIS — I350 Nonrheumatic aortic (valve) stenosis: Secondary | ICD-10-CM | POA: Diagnosis not present

## 2020-09-17 DIAGNOSIS — I484 Atypical atrial flutter: Secondary | ICD-10-CM | POA: Diagnosis not present

## 2020-09-23 DIAGNOSIS — M25551 Pain in right hip: Secondary | ICD-10-CM | POA: Diagnosis not present

## 2020-09-23 DIAGNOSIS — I350 Nonrheumatic aortic (valve) stenosis: Secondary | ICD-10-CM | POA: Diagnosis not present

## 2020-09-23 DIAGNOSIS — I10 Essential (primary) hypertension: Secondary | ICD-10-CM | POA: Diagnosis not present

## 2020-09-23 DIAGNOSIS — I480 Paroxysmal atrial fibrillation: Secondary | ICD-10-CM | POA: Diagnosis not present

## 2020-09-23 DIAGNOSIS — I484 Atypical atrial flutter: Secondary | ICD-10-CM | POA: Diagnosis not present

## 2020-09-23 DIAGNOSIS — M5441 Lumbago with sciatica, right side: Secondary | ICD-10-CM | POA: Diagnosis not present

## 2020-09-30 DIAGNOSIS — I10 Essential (primary) hypertension: Secondary | ICD-10-CM | POA: Diagnosis not present

## 2020-09-30 DIAGNOSIS — M5441 Lumbago with sciatica, right side: Secondary | ICD-10-CM | POA: Diagnosis not present

## 2020-09-30 DIAGNOSIS — I480 Paroxysmal atrial fibrillation: Secondary | ICD-10-CM | POA: Diagnosis not present

## 2020-09-30 DIAGNOSIS — I484 Atypical atrial flutter: Secondary | ICD-10-CM | POA: Diagnosis not present

## 2020-09-30 DIAGNOSIS — I350 Nonrheumatic aortic (valve) stenosis: Secondary | ICD-10-CM | POA: Diagnosis not present

## 2020-09-30 DIAGNOSIS — M25551 Pain in right hip: Secondary | ICD-10-CM | POA: Diagnosis not present

## 2020-10-01 DIAGNOSIS — I10 Essential (primary) hypertension: Secondary | ICD-10-CM | POA: Diagnosis not present

## 2020-10-01 DIAGNOSIS — Z7901 Long term (current) use of anticoagulants: Secondary | ICD-10-CM | POA: Diagnosis not present

## 2020-10-01 DIAGNOSIS — M25551 Pain in right hip: Secondary | ICD-10-CM | POA: Diagnosis not present

## 2020-10-01 DIAGNOSIS — I480 Paroxysmal atrial fibrillation: Secondary | ICD-10-CM | POA: Diagnosis not present

## 2020-10-01 DIAGNOSIS — I484 Atypical atrial flutter: Secondary | ICD-10-CM | POA: Diagnosis not present

## 2020-10-01 DIAGNOSIS — Z9181 History of falling: Secondary | ICD-10-CM | POA: Diagnosis not present

## 2020-10-01 DIAGNOSIS — I350 Nonrheumatic aortic (valve) stenosis: Secondary | ICD-10-CM | POA: Diagnosis not present

## 2020-10-01 DIAGNOSIS — M5441 Lumbago with sciatica, right side: Secondary | ICD-10-CM | POA: Diagnosis not present

## 2020-10-03 ENCOUNTER — Telehealth: Payer: Medicare Other | Admitting: Cardiology

## 2020-10-07 ENCOUNTER — Other Ambulatory Visit: Payer: Self-pay | Admitting: *Deleted

## 2020-10-07 DIAGNOSIS — I10 Essential (primary) hypertension: Secondary | ICD-10-CM | POA: Diagnosis not present

## 2020-10-07 DIAGNOSIS — I350 Nonrheumatic aortic (valve) stenosis: Secondary | ICD-10-CM | POA: Diagnosis not present

## 2020-10-07 DIAGNOSIS — I484 Atypical atrial flutter: Secondary | ICD-10-CM | POA: Diagnosis not present

## 2020-10-07 DIAGNOSIS — M25551 Pain in right hip: Secondary | ICD-10-CM | POA: Diagnosis not present

## 2020-10-07 DIAGNOSIS — I480 Paroxysmal atrial fibrillation: Secondary | ICD-10-CM | POA: Diagnosis not present

## 2020-10-07 DIAGNOSIS — M5441 Lumbago with sciatica, right side: Secondary | ICD-10-CM | POA: Diagnosis not present

## 2020-10-07 MED ORDER — APIXABAN 5 MG PO TABS: 5.0000 mg | ORAL_TABLET | Freq: Two times a day (BID) | ORAL | 2 refills | Status: DC

## 2020-10-11 ENCOUNTER — Other Ambulatory Visit: Payer: Self-pay | Admitting: *Deleted

## 2020-10-11 DIAGNOSIS — Z7901 Long term (current) use of anticoagulants: Secondary | ICD-10-CM | POA: Diagnosis not present

## 2020-10-11 DIAGNOSIS — M5441 Lumbago with sciatica, right side: Secondary | ICD-10-CM | POA: Diagnosis not present

## 2020-10-11 DIAGNOSIS — I480 Paroxysmal atrial fibrillation: Secondary | ICD-10-CM | POA: Diagnosis not present

## 2020-10-11 DIAGNOSIS — Z8673 Personal history of transient ischemic attack (TIA), and cerebral infarction without residual deficits: Secondary | ICD-10-CM | POA: Diagnosis not present

## 2020-10-11 DIAGNOSIS — I10 Essential (primary) hypertension: Secondary | ICD-10-CM | POA: Diagnosis not present

## 2020-10-11 DIAGNOSIS — I350 Nonrheumatic aortic (valve) stenosis: Secondary | ICD-10-CM | POA: Diagnosis not present

## 2020-10-11 DIAGNOSIS — I484 Atypical atrial flutter: Secondary | ICD-10-CM | POA: Diagnosis not present

## 2020-10-11 DIAGNOSIS — M25551 Pain in right hip: Secondary | ICD-10-CM | POA: Diagnosis not present

## 2020-10-11 DIAGNOSIS — Z9181 History of falling: Secondary | ICD-10-CM | POA: Diagnosis not present

## 2020-10-11 MED ORDER — APIXABAN 5 MG PO TABS: 5.0000 mg | ORAL_TABLET | Freq: Two times a day (BID) | ORAL | 0 refills | Status: DC

## 2020-10-14 DIAGNOSIS — I10 Essential (primary) hypertension: Secondary | ICD-10-CM | POA: Diagnosis not present

## 2020-10-14 DIAGNOSIS — M25551 Pain in right hip: Secondary | ICD-10-CM | POA: Diagnosis not present

## 2020-10-14 DIAGNOSIS — I484 Atypical atrial flutter: Secondary | ICD-10-CM | POA: Diagnosis not present

## 2020-10-14 DIAGNOSIS — M5441 Lumbago with sciatica, right side: Secondary | ICD-10-CM | POA: Diagnosis not present

## 2020-10-14 DIAGNOSIS — I480 Paroxysmal atrial fibrillation: Secondary | ICD-10-CM | POA: Diagnosis not present

## 2020-10-14 DIAGNOSIS — I350 Nonrheumatic aortic (valve) stenosis: Secondary | ICD-10-CM | POA: Diagnosis not present

## 2020-10-15 ENCOUNTER — Encounter: Payer: Self-pay | Admitting: *Deleted

## 2020-10-15 NOTE — Progress Notes (Signed)
Faxed notification received that elquis 5 mg BID approved from 10/15/2020 to 08/02/2021.

## 2020-10-22 DIAGNOSIS — I350 Nonrheumatic aortic (valve) stenosis: Secondary | ICD-10-CM | POA: Diagnosis not present

## 2020-10-22 DIAGNOSIS — I480 Paroxysmal atrial fibrillation: Secondary | ICD-10-CM | POA: Diagnosis not present

## 2020-10-22 DIAGNOSIS — I10 Essential (primary) hypertension: Secondary | ICD-10-CM | POA: Diagnosis not present

## 2020-10-22 DIAGNOSIS — M25551 Pain in right hip: Secondary | ICD-10-CM | POA: Diagnosis not present

## 2020-10-22 DIAGNOSIS — I484 Atypical atrial flutter: Secondary | ICD-10-CM | POA: Diagnosis not present

## 2020-10-22 DIAGNOSIS — M5441 Lumbago with sciatica, right side: Secondary | ICD-10-CM | POA: Diagnosis not present

## 2020-10-29 DIAGNOSIS — Z9181 History of falling: Secondary | ICD-10-CM | POA: Diagnosis not present

## 2020-10-29 DIAGNOSIS — M5441 Lumbago with sciatica, right side: Secondary | ICD-10-CM | POA: Diagnosis not present

## 2020-10-29 DIAGNOSIS — I10 Essential (primary) hypertension: Secondary | ICD-10-CM | POA: Diagnosis not present

## 2020-10-29 DIAGNOSIS — Z7901 Long term (current) use of anticoagulants: Secondary | ICD-10-CM | POA: Diagnosis not present

## 2020-10-29 DIAGNOSIS — I484 Atypical atrial flutter: Secondary | ICD-10-CM | POA: Diagnosis not present

## 2020-10-29 DIAGNOSIS — M25551 Pain in right hip: Secondary | ICD-10-CM | POA: Diagnosis not present

## 2020-10-29 DIAGNOSIS — I480 Paroxysmal atrial fibrillation: Secondary | ICD-10-CM | POA: Diagnosis not present

## 2020-10-29 DIAGNOSIS — I350 Nonrheumatic aortic (valve) stenosis: Secondary | ICD-10-CM | POA: Diagnosis not present

## 2020-11-04 DIAGNOSIS — I350 Nonrheumatic aortic (valve) stenosis: Secondary | ICD-10-CM | POA: Diagnosis not present

## 2020-11-04 DIAGNOSIS — I10 Essential (primary) hypertension: Secondary | ICD-10-CM | POA: Diagnosis not present

## 2020-11-04 DIAGNOSIS — M25551 Pain in right hip: Secondary | ICD-10-CM | POA: Diagnosis not present

## 2020-11-04 DIAGNOSIS — I480 Paroxysmal atrial fibrillation: Secondary | ICD-10-CM | POA: Diagnosis not present

## 2020-11-04 DIAGNOSIS — M5441 Lumbago with sciatica, right side: Secondary | ICD-10-CM | POA: Diagnosis not present

## 2020-11-04 DIAGNOSIS — I484 Atypical atrial flutter: Secondary | ICD-10-CM | POA: Diagnosis not present

## 2020-11-10 DIAGNOSIS — I480 Paroxysmal atrial fibrillation: Secondary | ICD-10-CM | POA: Diagnosis not present

## 2020-11-10 DIAGNOSIS — M25551 Pain in right hip: Secondary | ICD-10-CM | POA: Diagnosis not present

## 2020-11-10 DIAGNOSIS — I350 Nonrheumatic aortic (valve) stenosis: Secondary | ICD-10-CM | POA: Diagnosis not present

## 2020-11-10 DIAGNOSIS — I10 Essential (primary) hypertension: Secondary | ICD-10-CM | POA: Diagnosis not present

## 2020-11-10 DIAGNOSIS — Z7901 Long term (current) use of anticoagulants: Secondary | ICD-10-CM | POA: Diagnosis not present

## 2020-11-10 DIAGNOSIS — Z9181 History of falling: Secondary | ICD-10-CM | POA: Diagnosis not present

## 2020-11-10 DIAGNOSIS — M5441 Lumbago with sciatica, right side: Secondary | ICD-10-CM | POA: Diagnosis not present

## 2020-11-10 DIAGNOSIS — Z8673 Personal history of transient ischemic attack (TIA), and cerebral infarction without residual deficits: Secondary | ICD-10-CM | POA: Diagnosis not present

## 2020-11-10 DIAGNOSIS — I484 Atypical atrial flutter: Secondary | ICD-10-CM | POA: Diagnosis not present

## 2020-11-24 ENCOUNTER — Other Ambulatory Visit: Payer: Self-pay | Admitting: Cardiology

## 2020-11-25 ENCOUNTER — Ambulatory Visit: Payer: Medicare Other | Admitting: Cardiology

## 2020-11-29 DIAGNOSIS — M5441 Lumbago with sciatica, right side: Secondary | ICD-10-CM | POA: Diagnosis not present

## 2020-11-29 DIAGNOSIS — I350 Nonrheumatic aortic (valve) stenosis: Secondary | ICD-10-CM | POA: Diagnosis not present

## 2020-11-29 DIAGNOSIS — I484 Atypical atrial flutter: Secondary | ICD-10-CM | POA: Diagnosis not present

## 2020-11-29 DIAGNOSIS — M25551 Pain in right hip: Secondary | ICD-10-CM | POA: Diagnosis not present

## 2020-11-29 DIAGNOSIS — I480 Paroxysmal atrial fibrillation: Secondary | ICD-10-CM | POA: Diagnosis not present

## 2020-11-29 DIAGNOSIS — I10 Essential (primary) hypertension: Secondary | ICD-10-CM | POA: Diagnosis not present

## 2020-12-09 DIAGNOSIS — I10 Essential (primary) hypertension: Secondary | ICD-10-CM | POA: Diagnosis not present

## 2020-12-09 DIAGNOSIS — I480 Paroxysmal atrial fibrillation: Secondary | ICD-10-CM | POA: Diagnosis not present

## 2020-12-09 DIAGNOSIS — I350 Nonrheumatic aortic (valve) stenosis: Secondary | ICD-10-CM | POA: Diagnosis not present

## 2020-12-09 DIAGNOSIS — I484 Atypical atrial flutter: Secondary | ICD-10-CM | POA: Diagnosis not present

## 2020-12-09 DIAGNOSIS — M5441 Lumbago with sciatica, right side: Secondary | ICD-10-CM | POA: Diagnosis not present

## 2020-12-09 DIAGNOSIS — M25551 Pain in right hip: Secondary | ICD-10-CM | POA: Diagnosis not present

## 2020-12-18 ENCOUNTER — Encounter (INDEPENDENT_AMBULATORY_CARE_PROVIDER_SITE_OTHER): Payer: Medicare Other | Admitting: Ophthalmology

## 2021-01-07 ENCOUNTER — Telehealth: Payer: Self-pay | Admitting: *Deleted

## 2021-01-07 NOTE — Telephone Encounter (Signed)
Says eliquis went to old address again. Advised that [employer] [facility] will be contacted to update Contacted [employer] to advise and was informed that [employer] would have to be contacted with this update Contacted [employer] [phone], spoke with Caitlyn (patient care coordinator) and gave new address [place]  [address]. Caitlyn says its updated in their system now.

## 2021-01-08 ENCOUNTER — Encounter (INDEPENDENT_AMBULATORY_CARE_PROVIDER_SITE_OTHER): Payer: Medicare Other | Admitting: Ophthalmology

## 2021-01-08 ENCOUNTER — Ambulatory Visit (INDEPENDENT_AMBULATORY_CARE_PROVIDER_SITE_OTHER): Payer: Medicare Other | Admitting: Ophthalmology

## 2021-01-08 ENCOUNTER — Encounter (INDEPENDENT_AMBULATORY_CARE_PROVIDER_SITE_OTHER): Payer: Self-pay | Admitting: Ophthalmology

## 2021-01-08 ENCOUNTER — Other Ambulatory Visit: Payer: Self-pay

## 2021-01-08 DIAGNOSIS — H43813 Vitreous degeneration, bilateral: Secondary | ICD-10-CM | POA: Diagnosis not present

## 2021-01-08 DIAGNOSIS — H353113 Nonexudative age-related macular degeneration, right eye, advanced atrophic without subfoveal involvement: Secondary | ICD-10-CM

## 2021-01-08 DIAGNOSIS — H353222 Exudative age-related macular degeneration, left eye, with inactive choroidal neovascularization: Secondary | ICD-10-CM | POA: Diagnosis not present

## 2021-01-08 DIAGNOSIS — H353124 Nonexudative age-related macular degeneration, left eye, advanced atrophic with subfoveal involvement: Secondary | ICD-10-CM

## 2021-01-08 NOTE — Assessment & Plan Note (Signed)
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

## 2021-01-08 NOTE — Assessment & Plan Note (Signed)
I explained the patient that there is no active therapy currently to prevent progression or to restore acuity from this condition

## 2021-01-08 NOTE — Progress Notes (Signed)
01/08/2021     CHIEF COMPLAINT Patient presents for Retina Follow Up (9 month fu OU and OCT/Pt states, "I am having a really hard time with OS. I have a very blurry spot in the center of my va.")   HISTORY OF PRESENT ILLNESS: Christine Pugh is a 87 y.o. female who presents to the clinic today for:   HPI    Retina Follow Up    Diagnosis: Dry AMD   Laterality: both eyes   Onset: 9 months ago   Severity: mild   Duration: 9 months   Course: gradually worsening   Comments: 9 month fu OU and OCT Pt states, "I am having a really hard time with OS. I have a very blurry spot in the center of my va."       Last edited by Kendra Opitz, COA on 01/08/2021 10:24 AM. (History)      Referring physician: Caryl Bis, MD [place],  [place] [id number]  HISTORICAL INFORMATION:   Selected notes from the MEDICAL RECORD NUMBER    Lab Results  Component Value Date   HGBA1C 5.7 (H) 08/25/2016     CURRENT MEDICATIONS: No current outpatient medications on file. (Ophthalmic Drugs)   No current facility-administered medications for this visit. (Ophthalmic Drugs)   Current Outpatient Medications (Other)  Medication Sig  . acetaminophen (TYLENOL) 325 MG suppository Place 1-2 suppositories (325-650 mg total) rectally every 6 (six) hours as needed (mild pain, headache, or temp >/= 101 F).  Marland Kitchen amiodarone (PACERONE) 200 MG tablet TAKE 1/2 TABLET BY MOUTH DAILY  . apixaban (ELIQUIS) 5 MG TABS tablet Take 1 tablet (5 mg total) by mouth 2 (two) times daily.  Marland Kitchen HYDROcodone-acetaminophen (NORCO/VICODIN) 5-325 MG tablet Take 1 tablet by mouth every 6 (six) hours as needed for moderate pain.  . hydrocortisone (ANUSOL-HC) 25 MG suppository Place 1 suppository (25 mg total) rectally 2 (two) times daily.  Marland Kitchen levothyroxine (SYNTHROID, LEVOTHROID) 112 MCG tablet Take 1 tablet (112 mcg total) by mouth daily before breakfast.  . lidocaine (LIDODERM) 5 % Place 1 patch onto the skin daily. Remove & Discard  patch within 12 hours or as directed by MD  . Lutein-Zeaxanthin 25-5 MG CAPS Take 1 capsule by mouth 2 (two) times daily.  . methocarbamol (ROBAXIN) 500 MG tablet Take 1 tablet (500 mg total) by mouth 2 (two) times daily as needed for muscle spasms.  . metoprolol succinate (TOPROL-XL) 25 MG 24 hr tablet TAKE 0.5 TABLETS (12.5 MG TOTAL) BY MOUTH 2 (TWO) TIMES DAILY.  . Multiple Vitamin (MULTIVITAMIN) tablet Take 1 tablet by mouth daily.    Marland Kitchen triamcinolone cream (KENALOG) 0.1 % Apply 1 application topically daily as needed (irritated skin).   Marland Kitchen triamterene-hydrochlorothiazide (DYAZIDE) 37.5-25 MG capsule Take 1 each (1 capsule total) by mouth daily.   No current facility-administered medications for this visit. (Other)      REVIEW OF SYSTEMS:    ALLERGIES No Known Allergies  PAST MEDICAL HISTORY Past Medical History:  Diagnosis Date  . Aortic insufficiency    Mild  . Aortic stenosis    Moderate to severe  . 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  s/p Left brachial, radial, ulnar, axillary embolectomy 08/2016.  Marland Kitchen Atypical atrial flutter ([facility])   . Embolic stroke involving right cerebellar artery ([facility])   . Essential hypertension   . Hypothyroidism   . Ischemia of extremity   . Memory loss 11/26/2016  .  Moderate to severe aortic stenosis   . PAF (paroxysmal atrial fibrillation) ([facility])   . Paroxysmal atrial tachycardia ([facility])   . Stroke ([facility]) 08/2016  . Syncope    a. s/p ILR 01/2016.   Past Surgical History:  Procedure Laterality Date  . APPENDECTOMY    . ARTERY REPAIR Left 02/20/2020   Procedure: LEFT BRACHIAL ARTERY PSEUDOANEURYSM REPAIR WITH LEFT BRACHIAL TO ULNAR BYPASS USING VEIN;  Surgeon: Elam Dutch, MD;  Location: [facility];  Service: Vascular;  Laterality: Left;  . BREAST LUMPECTOMY    . dental implant    . EP IMPLANTABLE DEVICE N/A 01/10/2016   Procedure: Loop Recorder Insertion;  Surgeon: [name], MD;  Location: [facility];  Service:  Cardiovascular;  Laterality: N/A;  . IR GENERIC HISTORICAL  08/23/2016   IR US GUIDE VASC ACCESS RIGHT 08/23/2016 Corrie Mckusick, DO [facility]  . IR GENERIC HISTORICAL  08/23/2016   IR PERCUTANEOUS ART THROMBECTOMY/INFUSION INTRACRANIAL INC DIAG ANGIO 08/23/2016 Corrie Mckusick, DO [facility]  . IR GENERIC HISTORICAL  08/23/2016   IR ANGIO INTRA EXTRACRAN SEL COM CAROTID INNOMINATE UNI L MOD SED 08/23/2016 Corrie Mckusick, DO [facility]  . RADIOLOGY WITH ANESTHESIA Bilateral 08/23/2016   Procedure: RADIOLOGY WITH ANESTHESIA;  Surgeon: Medication Radiologist, MD;  Location: [facility];  Service: Radiology;  Laterality: Bilateral;  . THROMBECTOMY BRACHIAL ARTERY Left 08/23/2016   Procedure: Left Radial, Ulnar, Brachial and Axillary Embolectomy;  Surgeon: Elam Dutch, MD;  Location: [facility];  Service: Vascular;  Laterality: Left;  . THYROIDECTOMY    . TONSILLECTOMY    . TOTAL ABDOMINAL HYSTERECTOMY    . VEIN HARVEST Right 02/20/2020   Procedure: RIGHT LEG GREATER SAPHENOUS VEIN HARVEST;  Surgeon: Elam Dutch, MD;  Location: [facility];  Service: Vascular;  Laterality: Right;  . VITRECTOMY AND CATARACT      FAMILY HISTORY Family History  Problem Relation Age of Onset  . Coronary artery disease Other   . Stroke Mother   . Stroke Sister     SOCIAL HISTORY Social History   Tobacco Use  . Smoking status: Never Smoker  . Smokeless tobacco: Never Used  Vaping Use  . Vaping Use: Never used  Substance Use Topics  . Alcohol use: No    Alcohol/week: 0.0 standard drinks  . Drug use: No         OPHTHALMIC EXAM: Base Eye Exam    Visual Acuity (ETDRS)      Right Left   Dist [place] 20/80 -1 CF at 3'   Dist ph [place] 20/70        Tonometry (Tonopen, 10:31 AM)      Right Left   Pressure 10 12       Pupils      Pupils Dark Light Shape React APD   Right PERRL 3 2 Round Slow None   Left PERRL 3 2 Round Slow None       Visual Fields (Counting fingers)      Left Right    Full Full        Extraocular Movement      Right Left    Full Full       Neuro/Psych    Oriented x3: Yes   Mood/Affect: Normal       Dilation    Both eyes: 1.0% Mydriacyl, 2.5% Phenylephrine @ 10:31 AM        Slit Lamp and Fundus Exam    Slit Lamp Exam  Right Left   Lids/Lashes Normal Normal   Conjunctiva/Sclera White and quiet White and quiet   Cornea Clear Clear   Anterior Chamber Deep and quiet Deep and quiet   Iris Round and reactive Round and reactive   Lens Centered posterior chamber intraocular lens Centered posterior chamber intraocular lens   Anterior Vitreous Normal Normal       Fundus Exam      Right Left   Posterior Vitreous Posterior vitreous detachment Posterior vitreous detachment   Disc Normal Normal   C/D Ratio 0.3 0.3   Macula Pigmented atrophy, Geographic atrophy surrounds a foveal avascular zone, subfoveal pigment remains but smaller than prior Pigmented atrophy, subfoveal geographic atrophy RPE no foveal pigment remains OS   Vessels Normal Normal   Periphery Normal Normal          IMAGING AND PROCEDURES  Imaging and Procedures for 01/08/21  OCT, Retina - OU - Both Eyes       Right Eye Quality was good. Scan locations included subfoveal. Central Foveal Thickness: 279. Progression has been stable. Findings include abnormal foveal contour, subretinal scarring, no SRF, no IRF.   Left Eye Quality was good. Scan locations included subfoveal. Central Foveal Thickness: 315. Progression has been stable. Findings include abnormal foveal contour, no SRF, no IRF, subretinal scarring.   Notes No signs of active CNVM in either eye.  We will continue to observe  Macular geographic atrophy accounts for acuity with loss of the choriocapillaris and RPE junction in the foveal and subfoveal region left eye and progressive in the right eye.                ASSESSMENT/PLAN:  Advanced nonexudative age-related macular degeneration of left eye with subfoveal  involvement I explained the patient that there is no active therapy currently to prevent progression or to restore acuity from this condition  Advanced nonexudative age-related macular degeneration of right eye without subfoveal involvement Decline in acuity from 20/40 to now 20/ 80 basis of RPE choriocapillaris dropout progression in the fovea      ICD-10-CM   1. Exudative age-related macular degeneration of left eye with inactive choroidal neovascularization (HCC)  H35.3222 OCT, Retina - OU - Both Eyes  2. Posterior vitreous detachment of both eyes  H43.813   3. Advanced nonexudative age-related macular degeneration of right eye without subfoveal involvement  H35.3113 OCT, Retina - OU - Both Eyes  4. Advanced nonexudative age-related macular degeneration of left eye with subfoveal involvement  H35.3124     1.  No therapeutic intervention appropriate at this time.  2.  Patient does continue on vitamin supplementation including lutein and zeaxanthin which I agree with.  3.  Ophthalmic Meds Ordered this visit:  No orders of the defined types were placed in this encounter.      Return in about 1 year (around 01/08/2022) for DILATE OU, OCT.  There are no Patient Instructions on file for this visit.   Explained the diagnoses, plan, and follow up with the patient and they expressed understanding.  Patient expressed understanding of the importance of proper follow up care.   Clent Demark Emmali Karow M.D. Diseases & Surgery of the Retina and Vitreous Retina & Diabetic [facility] 01/08/21     Abbreviations: M myopia (nearsighted); A astigmatism; H hyperopia (farsighted); P presbyopia; Mrx spectacle prescription;  CTL contact lenses; OD right eye; OS left eye; OU both eyes  XT exotropia; ET esotropia; PEK punctate epithelial keratitis; PEE punctate epithelial erosions; DES dry  eye syndrome; MGD meibomian gland dysfunction; ATs artificial tears; PFAT's preservative free artificial tears; [place]  nuclear sclerotic cataract; PSC posterior subcapsular cataract; ERM epi-retinal membrane; PVD posterior vitreous detachment; RD retinal detachment; DM diabetes mellitus; DR diabetic retinopathy; NPDR non-proliferative diabetic retinopathy; PDR proliferative diabetic retinopathy; CSME clinically significant macular edema; DME diabetic macular edema; dbh dot blot hemorrhages; CWS cotton wool spot; POAG primary open angle glaucoma; C/D cup-to-disc ratio; HVF humphrey visual field; GVF goldmann visual field; OCT optical coherence tomography; IOP intraocular pressure; BRVO Branch retinal vein occlusion; CRVO central retinal vein occlusion; CRAO central retinal artery occlusion; BRAO branch retinal artery occlusion; RT retinal tear; SB scleral buckle; PPV pars plana vitrectomy; VH Vitreous hemorrhage; PRP panretinal laser photocoagulation; IVK intravitreal kenalog; VMT vitreomacular traction; MH Macular hole;  NVD neovascularization of the disc; NVE neovascularization elsewhere; AREDS age related eye disease study; ARMD age related macular degeneration; POAG primary open angle glaucoma; EBMD epithelial/anterior basement membrane dystrophy; ACIOL anterior chamber intraocular lens; IOL intraocular lens; PCIOL posterior chamber intraocular lens; Phaco/IOL phacoemulsification with intraocular lens placement; [place] photorefractive keratectomy; LASIK laser assisted in situ keratomileusis; HTN hypertension; DM diabetes mellitus; COPD chronic obstructive pulmonary disease

## 2021-03-21 DIAGNOSIS — E782 Mixed hyperlipidemia: Secondary | ICD-10-CM | POA: Diagnosis not present

## 2021-03-21 DIAGNOSIS — N183 Chronic kidney disease, stage 3 unspecified: Secondary | ICD-10-CM | POA: Diagnosis not present

## 2021-03-21 DIAGNOSIS — E7849 Other hyperlipidemia: Secondary | ICD-10-CM | POA: Diagnosis not present

## 2021-03-21 DIAGNOSIS — E039 Hypothyroidism, unspecified: Secondary | ICD-10-CM | POA: Diagnosis not present

## 2021-03-21 DIAGNOSIS — I10 Essential (primary) hypertension: Secondary | ICD-10-CM | POA: Diagnosis not present

## 2021-03-27 ENCOUNTER — Ambulatory Visit: Payer: Medicare Other | Admitting: Cardiology

## 2021-03-28 DIAGNOSIS — I10 Essential (primary) hypertension: Secondary | ICD-10-CM | POA: Diagnosis not present

## 2021-03-28 DIAGNOSIS — I350 Nonrheumatic aortic (valve) stenosis: Secondary | ICD-10-CM | POA: Diagnosis not present

## 2021-03-28 DIAGNOSIS — Z0001 Encounter for general adult medical examination with abnormal findings: Secondary | ICD-10-CM | POA: Diagnosis not present

## 2021-03-28 DIAGNOSIS — E7849 Other hyperlipidemia: Secondary | ICD-10-CM | POA: Diagnosis not present

## 2021-03-28 DIAGNOSIS — N1831 Chronic kidney disease, stage 3a: Secondary | ICD-10-CM | POA: Diagnosis not present

## 2021-03-28 DIAGNOSIS — I8391 Asymptomatic varicose veins of right lower extremity: Secondary | ICD-10-CM | POA: Diagnosis not present

## 2021-04-04 ENCOUNTER — Telehealth: Payer: Self-pay | Admitting: Cardiology

## 2021-04-04 NOTE — Telephone Encounter (Signed)
Patient called to say that her 3 month Eliquis came thru the mail yesterday.

## 2021-04-04 NOTE — Telephone Encounter (Signed)
Noted  

## 2021-04-16 DIAGNOSIS — Z23 Encounter for immunization: Secondary | ICD-10-CM | POA: Diagnosis not present

## 2021-05-19 ENCOUNTER — Other Ambulatory Visit: Payer: Self-pay | Admitting: Cardiology

## 2021-06-04 ENCOUNTER — Telehealth: Payer: Self-pay | Admitting: Cardiology

## 2021-06-04 NOTE — Telephone Encounter (Signed)
  Patient Consent for Virtual Visit        Christine Pugh has provided verbal consent on 06/04/2021 for a virtual visit (video or telephone).   CONSENT FOR VIRTUAL VISIT FOR:  Christine Pugh  By participating in this virtual visit I agree to the following:  I hereby voluntarily request, consent and authorize [facility] and its employed or contracted physicians, physician assistants, nurse practitioners or other licensed health care professionals (the Practitioner), to provide me with telemedicine health care services (the "Services") as deemed necessary by the treating Practitioner. I acknowledge and consent to receive the Services by the Practitioner via telemedicine. I understand that the telemedicine visit will involve communicating with the Practitioner through live audiovisual communication technology and the disclosure of certain medical information by electronic transmission. I acknowledge that I have been given the opportunity to request an in-person assessment or other available alternative prior to the telemedicine visit and am voluntarily participating in the telemedicine visit.  I understand that I have the right to withhold or withdraw my consent to the use of telemedicine in the course of my care at any time, without affecting my right to future care or treatment, and that the Practitioner or I may terminate the telemedicine visit at any time. I understand that I have the right to inspect all information obtained and/or recorded in the course of the telemedicine visit and may receive copies of available information for a reasonable fee.  I understand that some of the potential risks of receiving the Services via telemedicine include:  Delay or interruption in medical evaluation due to technological equipment failure or disruption; Information transmitted may not be sufficient (e.g. poor resolution of images) to allow for appropriate medical decision making by the Practitioner;  and/or  In rare instances, security protocols could fail, causing a breach of personal health information.  Furthermore, I acknowledge that it is my responsibility to provide information about my medical history, conditions and care that is complete and accurate to the best of my ability. I acknowledge that Practitioner's advice, recommendations, and/or decision may be based on factors not within their control, such as incomplete or inaccurate data provided by me or distortions of diagnostic images or specimens that may result from electronic transmissions. I understand that the practice of medicine is not an exact science and that Practitioner makes no warranties or guarantees regarding treatment outcomes. I acknowledge that a copy of this consent can be made available to me via my patient portal ([facility]), or I can request a printed copy by calling the office of [facility].    I understand that my insurance will be billed for this visit.   I have read or had this consent read to me. I understand the contents of this consent, which adequately explains the benefits and risks of the Services being provided via telemedicine.  I have been provided ample opportunity to ask questions regarding this consent and the Services and have had my questions answered to my satisfaction. I give my informed consent for the services to be provided through the use of telemedicine in my medical care

## 2021-06-04 NOTE — Telephone Encounter (Signed)
Patient is returning call.  °

## 2021-06-04 NOTE — Telephone Encounter (Signed)
Pt c/o medication issue:  1. Name of Medication:  apixaban (ELIQUIS) 5 MG TABS tablet  2. How are you currently taking this medication (dosage and times per day)? As prescribed   3. Are you having a reaction (difficulty breathing--STAT)? No   4. What is your medication issue? Wants to speak with Alma Friendly in regards to her Eliquis assistance due to stating it will be changing at the first of the year.

## 2021-06-04 NOTE — Telephone Encounter (Signed)
Says she received letter to reapply for eliquis patient assistance. Says she will have new insurance for 2023 and receive social security benefits letter by January 2023. Advised that we will do new application in January 6767.  Also rescheduled visit that was canceled. Says she has transportation issues and will do vitual visit.

## 2021-06-13 ENCOUNTER — Other Ambulatory Visit: Payer: Self-pay | Admitting: Cardiology

## 2021-06-17 ENCOUNTER — Telehealth: Payer: Self-pay | Admitting: Cardiology

## 2021-06-17 NOTE — Telephone Encounter (Signed)
Appointment changed to 07/24/21 1:20 pm with Dr. Noemi Chapel visit

## 2021-06-17 NOTE — Telephone Encounter (Signed)
Patient is looking to have her appt moved to 2022 due to her insurance changing in 2023. She would have to pay a co pay for the appt on 08/05/2021. Please advise

## 2021-06-23 ENCOUNTER — Other Ambulatory Visit: Payer: Self-pay | Admitting: *Deleted

## 2021-06-23 MED ORDER — APIXABAN 5 MG PO TABS: 5.0000 mg | ORAL_TABLET | Freq: Two times a day (BID) | ORAL | 1 refills | Status: DC

## 2021-06-23 NOTE — Telephone Encounter (Signed)
Prescription refill request for Eliquis received. Indication: Afib  Last office visit: 09/12/2020 Scr: 0.84, 03/21/2021 via [facility] Age: 87 yo Weight: 63 kg   Refill sent.

## 2021-07-14 ENCOUNTER — Telehealth: Payer: Self-pay | Admitting: Cardiology

## 2021-07-14 NOTE — Telephone Encounter (Signed)
Patient is calling to say thank for the help with the eliquis

## 2021-07-14 NOTE — Telephone Encounter (Signed)
Noted  

## 2021-07-18 ENCOUNTER — Other Ambulatory Visit: Payer: Self-pay | Admitting: *Deleted

## 2021-07-18 MED ORDER — APIXABAN 5 MG PO TABS: 5.0000 mg | ORAL_TABLET | Freq: Two times a day (BID) | ORAL | 3 refills | Status: DC

## 2021-07-23 ENCOUNTER — Ambulatory Visit (INDEPENDENT_AMBULATORY_CARE_PROVIDER_SITE_OTHER): Payer: Medicare Other | Admitting: Cardiology

## 2021-07-23 ENCOUNTER — Encounter: Payer: Self-pay | Admitting: *Deleted

## 2021-07-23 ENCOUNTER — Encounter: Payer: Self-pay | Admitting: Cardiology

## 2021-07-23 VITALS — BP 122/82 | HR 64 | Ht 63.0 in | Wt 131.0 lb

## 2021-07-23 DIAGNOSIS — I480 Paroxysmal atrial fibrillation: Secondary | ICD-10-CM

## 2021-07-23 DIAGNOSIS — I350 Nonrheumatic aortic (valve) stenosis: Secondary | ICD-10-CM | POA: Diagnosis not present

## 2021-07-23 MED ORDER — APIXABAN 2.5 MG PO TABS: 2.5000 mg | ORAL_TABLET | Freq: Two times a day (BID) | ORAL | 2 refills | Status: DC

## 2021-07-23 NOTE — Patient Instructions (Addendum)
Medication Instructions:  Your physician has recommended you make the following change in your medication:  Decrease eliquis to 2.5 mg twice daily Continue other medications the same  Labwork: none  Testing/Procedures: none  Follow-Up: Your physician recommends that you schedule a follow-up appointment in: 6 months (phone or office)  Any Other Special Instructions Will Be Listed Below (If Applicable).  If you need a refill on your cardiac medications before your next appointment, please call your pharmacy.

## 2021-07-23 NOTE — Progress Notes (Signed)
Virtual Visit via Telephone Note   This visit type was conducted due to national recommendations for restrictions regarding the COVID-19 Pandemic (e.g. social distancing) in an effort to limit this patient's exposure and mitigate transmission in our community.  Due to her co-morbid illnesses, this patient is at least at moderate risk for complications without adequate follow up.  This format is felt to be most appropriate for this patient at this time.  The patient did not have access to video technology/had technical difficulties with video requiring transitioning to audio format only (telephone).  All issues noted in this document were discussed and addressed.  No physical exam could be performed with this format.  Please refer to the patient's chart for her  consent to telehealth for [facility].    Date:  07/23/2021   ID:  Christine Pugh, DOB 1922-05-20, MRN [id number] The patient was identified using 2 identifiers.  Patient Location: Home Provider Location: Office/Clinic   PCP:  Caryl Bis, MD   [facility] Providers Cardiologist:  Rozann Lesches, MD {  Evaluation Performed:  Follow-Up Visit  Chief Complaint:  Cardiac follow-up  History of Present Illness:    Christine Pugh is a 87 y.o. female last assessed via telehealth encounter in February.  We spoke by phone again today.  She tells me that she is doing reasonably well.  Still careful with gait instability, has assistance at home as before.  She does not report any recent falls or other injuries.  I reviewed her medications which are stable and outlined below.  She does not report any spontaneous bleeding problems on Eliquis.  We are requesting interval lab work from Dr. Quillian Quince.  Reported vital signs are stable today.  Her weight is down a few more pounds.   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  s/p Left brachial, radial, ulnar, axillary embolectomy 08/2016.   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 s/p ILR 01/2016.   Past Surgical History:  Procedure Laterality Date   APPENDECTOMY     ARTERY REPAIR Left 02/20/2020   Procedure: LEFT BRACHIAL ARTERY PSEUDOANEURYSM REPAIR WITH LEFT BRACHIAL TO ULNAR BYPASS USING VEIN;  Surgeon: Elam Dutch, MD;  Location: [place];  Service: Vascular;  Laterality: Left;   BREAST LUMPECTOMY     dental implant     EP IMPLANTABLE DEVICE N/A 01/10/2016   Procedure: Loop Recorder Insertion;  Surgeon: [name], MD;  Location: [place] CV [facility];  Service: Cardiovascular;  Laterality: N/A;   IR GENERIC HISTORICAL  08/23/2016   IR US GUIDE VASC ACCESS RIGHT 08/23/2016 Corrie Mckusick, DO [facility]   IR GENERIC HISTORICAL  08/23/2016   IR PERCUTANEOUS ART THROMBECTOMY/INFUSION INTRACRANIAL INC DIAG ANGIO 08/23/2016 Corrie Mckusick, DO [facility]   IR GENERIC HISTORICAL  08/23/2016   IR ANGIO INTRA EXTRACRAN SEL COM CAROTID INNOMINATE UNI L MOD SED 08/23/2016 Corrie Mckusick, DO [facility]   RADIOLOGY WITH ANESTHESIA Bilateral  08/23/2016   Procedure: RADIOLOGY WITH ANESTHESIA;  Surgeon: Medication Radiologist, MD;  Location: [place];  Service: Radiology;  Laterality: Bilateral;   THROMBECTOMY BRACHIAL ARTERY Left 08/23/2016   Procedure: Left Radial, Ulnar, Brachial and Axillary Embolectomy;  Surgeon: Elam Dutch, MD;  [facility];  Service: Vascular;  Laterality: Left;   THYROIDECTOMY     TONSILLECTOMY     TOTAL ABDOMINAL HYSTERECTOMY     VEIN HARVEST Right 02/20/2020   Procedure: RIGHT LEG GREATER SAPHENOUS VEIN  HARVEST;  Surgeon: Elam Dutch, MD;  [facility];  Service: Vascular;  Laterality: Right;   VITRECTOMY AND CATARACT       Current Meds  Medication Sig   acetaminophen (TYLENOL) 325 MG suppository Place 1-2 suppositories (325-650 mg total) rectally every 6 (six) hours as needed (mild pain, headache, or temp >/= 101 F).   amiodarone (PACERONE) 200 MG tablet TAKE 1/2 TABLET BY MOUTH EVERY DAY   apixaban (ELIQUIS) 2.5 MG TABS tablet Take 1 tablet (2.5 mg total) by mouth 2 (two) times daily.   HYDROcodone-acetaminophen (NORCO/VICODIN) 5-325 MG tablet Take 1 tablet by mouth every 6 (six) hours as needed for moderate pain.   hydrocortisone (ANUSOL-HC) 25 MG suppository Place 1 suppository (25 mg total) rectally 2 (two) times daily.   levothyroxine (SYNTHROID, LEVOTHROID) 112 MCG tablet Take 1 tablet (112 mcg total) by mouth daily before breakfast.   lidocaine (LIDODERM) 5 % Place 1 patch onto the skin daily. Remove & Discard patch within 12 hours or as directed by MD   Lutein-Zeaxanthin 25-5 MG CAPS Take 1 capsule by mouth 2 (two) times daily.   metoprolol succinate (TOPROL-XL) 25 MG 24 hr tablet TAKE 0.5 TABLETS (12.5 MG TOTAL) BY MOUTH 2 (TWO) TIMES DAILY.   Multiple Vitamin (MULTIVITAMIN) tablet Take 1 tablet by mouth daily.     triamcinolone cream (KENALOG) 0.1 % Apply 1 application topically daily as needed (irritated skin).    triamterene-hydrochlorothiazide (DYAZIDE) 37.5-25 MG capsule Take 1 each (1 capsule total) by mouth daily.   [DISCONTINUED] apixaban (ELIQUIS) 5 MG TABS tablet Take 1 tablet (5 mg total) by mouth 2 (two) times daily.     Allergies:   Patient has no known allergies.   ROS:  No syncope.  Prior CV studies:   The following studies were reviewed today:  Echocardiogram 08/24/2016: Study Conclusions   - Left ventricle: The cavity size was normal. There was mild   concentric hypertrophy. Systolic function was vigorous. The   estimated ejection fraction was in  the range of 65% to 70%. Wall   motion was normal; there were no regional wall motion   abnormalities. Features are consistent with a pseudonormal left   ventricular filling pattern, with concomitant abnormal relaxation   and increased filling pressure (grade 2 diastolic dysfunction).   Doppler parameters are consistent with elevated ventricular   end-diastolic filling pressure. - Aortic valve: There was moderate stenosis. There was mild   regurgitation. Mean gradient (S): 44 mm Hg. Peak gradient (S): 74   mm Hg. Valve area (VTI): 0.64 cm^2. Valve area (Vmax): 0.59 cm^2.   Valve area (Vmean): 0.65 cm^2. - Aortic root: The aortic root was normal in size. - Mitral valve: There was mild regurgitation. - Left atrium: The atrium was moderately dilated. - Right ventricle: Systolic function was normal. - Tricuspid valve: There was mild regurgitation. - Pulmonary arteries: Systolic pressure was mildly increased. PA   peak pressure: 33 mm Hg (S). - Inferior vena cava:  The vessel was normal in size. - Pericardium, extracardiac: There was no pericardial effusion.  Labs/Other Tests and Data Reviewed:    EKG:  An ECG dated 02/17/2020 was personally reviewed today and demonstrated:  Sinus rhythm with PAC, left anterior fascicular block, nonspecific T wave changes.  Recent Labs:  October 2021: BUN 12, creatinine 0.98, potassium 3.8, AST 19, ALT 10, TSH 4.05, cholesterol 255, triglycerides 113, HDL 89, LDL 147  Wt Readings from Last 3 Encounters:  07/23/21 131 lb (59.4 kg)  09/12/20 139 lb (63 kg)  08/03/20 140 lb (63.5 kg)        Objective:    Vital Signs:  BP 122/82    Pulse 64    Ht 5\' 3"  (1.6 m)    Wt 131 lb (59.4 kg)    BMI 23.21 kg/m    Patient spoke comfortably on the phone.  No obvious shortness of breath at rest.  ASSESSMENT & PLAN:    1.  Paroxysmal atrial fibrillation/flutter.  CHA2DS2-VASc score is 7.  Based on age and most recent weight Eliquis dose should be reduced to 2.5 mg  twice daily.  Otherwise continue amiodarone and Toprol-XL.  Requesting interval lab work from Dr. Quillian Quince.  2.  Severe low gradient aortic stenosis with mild aortic regurgitation.  Following conservatively as she has declined further invasive evaluation.    Time:   Today, I have spent 6 minutes with the patient with telehealth technology discussing the above problems.     Medication Adjustments/Labs and Tests Ordered: Current medicines are reviewed at length with the patient today.  Concerns regarding medicines are outlined above.   Tests Ordered: No orders of the defined types were placed in this encounter.   Medication Changes: Meds ordered this encounter  Medications   apixaban (ELIQUIS) 2.5 MG TABS tablet    Sig: Take 1 tablet (2.5 mg total) by mouth 2 (two) times daily.    Dispense:  180 tablet    Refill:  2    07/23/2021 dose decrease    Follow Up: 6 months.  Signed, Rozann Lesches, MD  07/23/2021 1:40 PM    [facility]

## 2021-07-24 ENCOUNTER — Telehealth: Payer: Medicare Other | Admitting: Cardiology

## 2021-08-05 ENCOUNTER — Telehealth: Payer: Medicare Other | Admitting: Cardiology

## 2021-08-21 DIAGNOSIS — E876 Hypokalemia: Secondary | ICD-10-CM | POA: Diagnosis not present

## 2021-08-21 DIAGNOSIS — E7849 Other hyperlipidemia: Secondary | ICD-10-CM | POA: Diagnosis not present

## 2021-08-21 DIAGNOSIS — Z1389 Encounter for screening for other disorder: Secondary | ICD-10-CM | POA: Diagnosis not present

## 2021-08-21 DIAGNOSIS — R5383 Other fatigue: Secondary | ICD-10-CM | POA: Diagnosis not present

## 2021-08-21 DIAGNOSIS — N183 Chronic kidney disease, stage 3 unspecified: Secondary | ICD-10-CM | POA: Diagnosis not present

## 2021-08-21 DIAGNOSIS — E039 Hypothyroidism, unspecified: Secondary | ICD-10-CM | POA: Diagnosis not present

## 2021-08-21 DIAGNOSIS — E782 Mixed hyperlipidemia: Secondary | ICD-10-CM | POA: Diagnosis not present

## 2021-08-21 DIAGNOSIS — Z0001 Encounter for general adult medical examination with abnormal findings: Secondary | ICD-10-CM | POA: Diagnosis not present

## 2021-08-22 ENCOUNTER — Other Ambulatory Visit: Payer: Self-pay | Admitting: Cardiology

## 2021-08-28 DIAGNOSIS — E039 Hypothyroidism, unspecified: Secondary | ICD-10-CM | POA: Diagnosis not present

## 2021-08-28 DIAGNOSIS — I10 Essential (primary) hypertension: Secondary | ICD-10-CM | POA: Diagnosis not present

## 2021-08-28 DIAGNOSIS — D692 Other nonthrombocytopenic purpura: Secondary | ICD-10-CM | POA: Diagnosis not present

## 2021-08-28 DIAGNOSIS — E7849 Other hyperlipidemia: Secondary | ICD-10-CM | POA: Diagnosis not present

## 2021-08-28 DIAGNOSIS — H353290 Exudative age-related macular degeneration, unspecified eye, stage unspecified: Secondary | ICD-10-CM | POA: Diagnosis not present

## 2021-08-28 DIAGNOSIS — I8391 Asymptomatic varicose veins of right lower extremity: Secondary | ICD-10-CM | POA: Diagnosis not present

## 2021-08-28 DIAGNOSIS — I350 Nonrheumatic aortic (valve) stenosis: Secondary | ICD-10-CM | POA: Diagnosis not present

## 2021-09-16 ENCOUNTER — Telehealth: Payer: Self-pay | Admitting: Cardiology

## 2021-09-16 MED ORDER — AMIODARONE HCL 200 MG PO TABS: 200.0000 mg | ORAL_TABLET | Freq: Two times a day (BID) | ORAL | 1 refills | Status: DC

## 2021-09-16 NOTE — Telephone Encounter (Signed)
STAT if HR is under 50 or over 120 (normal HR is 60-100 beats per minute)  What is your heart rate? 110  Do you have a log of your heart rate readings (document readings)? no  Do you have any other symptoms? Cough, runny nose, no temp or sore throat

## 2021-09-16 NOTE — Telephone Encounter (Signed)
Contacted to clarify amiodarone dose increase from 100 mg to 200 mg twice daily for 5 days, then reduce to 200 mg daily Patient informed and verbalized understanding of plan.

## 2021-09-16 NOTE — Telephone Encounter (Addendum)
Denies dizziness, chest pain or sob.  Reports feeling foggy headed Reports taking plain mucinex for cold symptoms last night. Report coughing and runny nose started last week. Reports cough has improved some.  O2 ranging around 92 & HR ranging between 115-125 for the past 2 days Today BP 114/76 & HR 125 Advised to contact PCP for respiratory symptoms Advised message would be sent to provider for recommendation

## 2021-09-16 NOTE — Addendum Note (Signed)
Addended by: Merlene Laughter on: 09/16/2021 04:14 PM   Modules accepted: Orders

## 2021-09-16 NOTE — Telephone Encounter (Signed)
Patient informed and verbalized understanding of plan. 

## 2021-09-23 DIAGNOSIS — L84 Corns and callosities: Secondary | ICD-10-CM | POA: Diagnosis not present

## 2021-09-23 DIAGNOSIS — M152 Bouchard's nodes (with arthropathy): Secondary | ICD-10-CM | POA: Diagnosis not present

## 2021-09-23 DIAGNOSIS — I8391 Asymptomatic varicose veins of right lower extremity: Secondary | ICD-10-CM | POA: Diagnosis not present

## 2021-09-23 DIAGNOSIS — I350 Nonrheumatic aortic (valve) stenosis: Secondary | ICD-10-CM | POA: Diagnosis not present

## 2021-09-23 DIAGNOSIS — Z7901 Long term (current) use of anticoagulants: Secondary | ICD-10-CM | POA: Diagnosis not present

## 2021-09-23 DIAGNOSIS — H353290 Exudative age-related macular degeneration, unspecified eye, stage unspecified: Secondary | ICD-10-CM | POA: Diagnosis not present

## 2021-09-23 DIAGNOSIS — D692 Other nonthrombocytopenic purpura: Secondary | ICD-10-CM | POA: Diagnosis not present

## 2021-09-23 DIAGNOSIS — I129 Hypertensive chronic kidney disease with stage 1 through stage 4 chronic kidney disease, or unspecified chronic kidney disease: Secondary | ICD-10-CM | POA: Diagnosis not present

## 2021-09-23 DIAGNOSIS — I700 Atherosclerosis of aorta: Secondary | ICD-10-CM | POA: Diagnosis not present

## 2021-09-23 DIAGNOSIS — E782 Mixed hyperlipidemia: Secondary | ICD-10-CM | POA: Diagnosis not present

## 2021-09-23 DIAGNOSIS — M545 Low back pain, unspecified: Secondary | ICD-10-CM | POA: Diagnosis not present

## 2021-09-23 DIAGNOSIS — N189 Chronic kidney disease, unspecified: Secondary | ICD-10-CM | POA: Diagnosis not present

## 2021-09-23 DIAGNOSIS — M151 Heberden's nodes (with arthropathy): Secondary | ICD-10-CM | POA: Diagnosis not present

## 2021-09-23 DIAGNOSIS — Z95828 Presence of other vascular implants and grafts: Secondary | ICD-10-CM | POA: Diagnosis not present

## 2021-09-23 DIAGNOSIS — E039 Hypothyroidism, unspecified: Secondary | ICD-10-CM | POA: Diagnosis not present

## 2021-09-23 DIAGNOSIS — I482 Chronic atrial fibrillation, unspecified: Secondary | ICD-10-CM | POA: Diagnosis not present

## 2021-09-23 DIAGNOSIS — I739 Peripheral vascular disease, unspecified: Secondary | ICD-10-CM | POA: Diagnosis not present

## 2021-10-01 DIAGNOSIS — Z95828 Presence of other vascular implants and grafts: Secondary | ICD-10-CM | POA: Diagnosis not present

## 2021-10-01 DIAGNOSIS — E039 Hypothyroidism, unspecified: Secondary | ICD-10-CM | POA: Diagnosis not present

## 2021-10-01 DIAGNOSIS — D692 Other nonthrombocytopenic purpura: Secondary | ICD-10-CM | POA: Diagnosis not present

## 2021-10-01 DIAGNOSIS — N189 Chronic kidney disease, unspecified: Secondary | ICD-10-CM | POA: Diagnosis not present

## 2021-10-01 DIAGNOSIS — H353290 Exudative age-related macular degeneration, unspecified eye, stage unspecified: Secondary | ICD-10-CM | POA: Diagnosis not present

## 2021-10-01 DIAGNOSIS — M152 Bouchard's nodes (with arthropathy): Secondary | ICD-10-CM | POA: Diagnosis not present

## 2021-10-01 DIAGNOSIS — M545 Low back pain, unspecified: Secondary | ICD-10-CM | POA: Diagnosis not present

## 2021-10-01 DIAGNOSIS — I700 Atherosclerosis of aorta: Secondary | ICD-10-CM | POA: Diagnosis not present

## 2021-10-01 DIAGNOSIS — I739 Peripheral vascular disease, unspecified: Secondary | ICD-10-CM | POA: Diagnosis not present

## 2021-10-01 DIAGNOSIS — E782 Mixed hyperlipidemia: Secondary | ICD-10-CM | POA: Diagnosis not present

## 2021-10-01 DIAGNOSIS — I482 Chronic atrial fibrillation, unspecified: Secondary | ICD-10-CM | POA: Diagnosis not present

## 2021-10-01 DIAGNOSIS — I8391 Asymptomatic varicose veins of right lower extremity: Secondary | ICD-10-CM | POA: Diagnosis not present

## 2021-10-01 DIAGNOSIS — I129 Hypertensive chronic kidney disease with stage 1 through stage 4 chronic kidney disease, or unspecified chronic kidney disease: Secondary | ICD-10-CM | POA: Diagnosis not present

## 2021-10-01 DIAGNOSIS — L84 Corns and callosities: Secondary | ICD-10-CM | POA: Diagnosis not present

## 2021-10-01 DIAGNOSIS — I350 Nonrheumatic aortic (valve) stenosis: Secondary | ICD-10-CM | POA: Diagnosis not present

## 2021-10-01 DIAGNOSIS — Z7901 Long term (current) use of anticoagulants: Secondary | ICD-10-CM | POA: Diagnosis not present

## 2021-10-01 DIAGNOSIS — M151 Heberden's nodes (with arthropathy): Secondary | ICD-10-CM | POA: Diagnosis not present

## 2021-10-06 DIAGNOSIS — Z1329 Encounter for screening for other suspected endocrine disorder: Secondary | ICD-10-CM | POA: Diagnosis not present

## 2021-10-06 DIAGNOSIS — E039 Hypothyroidism, unspecified: Secondary | ICD-10-CM | POA: Diagnosis not present

## 2021-10-09 DIAGNOSIS — I350 Nonrheumatic aortic (valve) stenosis: Secondary | ICD-10-CM | POA: Diagnosis not present

## 2021-10-09 DIAGNOSIS — I700 Atherosclerosis of aorta: Secondary | ICD-10-CM | POA: Diagnosis not present

## 2021-10-09 DIAGNOSIS — I129 Hypertensive chronic kidney disease with stage 1 through stage 4 chronic kidney disease, or unspecified chronic kidney disease: Secondary | ICD-10-CM | POA: Diagnosis not present

## 2021-10-09 DIAGNOSIS — I482 Chronic atrial fibrillation, unspecified: Secondary | ICD-10-CM | POA: Diagnosis not present

## 2021-10-09 DIAGNOSIS — E782 Mixed hyperlipidemia: Secondary | ICD-10-CM | POA: Diagnosis not present

## 2021-10-09 DIAGNOSIS — L84 Corns and callosities: Secondary | ICD-10-CM | POA: Diagnosis not present

## 2021-10-09 DIAGNOSIS — I8391 Asymptomatic varicose veins of right lower extremity: Secondary | ICD-10-CM | POA: Diagnosis not present

## 2021-10-09 DIAGNOSIS — N189 Chronic kidney disease, unspecified: Secondary | ICD-10-CM | POA: Diagnosis not present

## 2021-10-09 DIAGNOSIS — I739 Peripheral vascular disease, unspecified: Secondary | ICD-10-CM | POA: Diagnosis not present

## 2021-10-09 DIAGNOSIS — Z7901 Long term (current) use of anticoagulants: Secondary | ICD-10-CM | POA: Diagnosis not present

## 2021-10-09 DIAGNOSIS — M545 Low back pain, unspecified: Secondary | ICD-10-CM | POA: Diagnosis not present

## 2021-10-09 DIAGNOSIS — M151 Heberden's nodes (with arthropathy): Secondary | ICD-10-CM | POA: Diagnosis not present

## 2021-10-09 DIAGNOSIS — M152 Bouchard's nodes (with arthropathy): Secondary | ICD-10-CM | POA: Diagnosis not present

## 2021-10-09 DIAGNOSIS — D692 Other nonthrombocytopenic purpura: Secondary | ICD-10-CM | POA: Diagnosis not present

## 2021-10-09 DIAGNOSIS — H353290 Exudative age-related macular degeneration, unspecified eye, stage unspecified: Secondary | ICD-10-CM | POA: Diagnosis not present

## 2021-10-09 DIAGNOSIS — E039 Hypothyroidism, unspecified: Secondary | ICD-10-CM | POA: Diagnosis not present

## 2021-10-09 DIAGNOSIS — Z95828 Presence of other vascular implants and grafts: Secondary | ICD-10-CM | POA: Diagnosis not present

## 2021-11-11 ENCOUNTER — Encounter: Payer: Self-pay | Admitting: *Deleted

## 2021-11-11 NOTE — Progress Notes (Signed)
Per [facility], patient's 3% out of pocket expense to receive eliquis is $631.97. Mychart message sent with details ?

## 2021-11-18 ENCOUNTER — Other Ambulatory Visit: Payer: Self-pay | Admitting: Cardiology

## 2021-11-19 ENCOUNTER — Other Ambulatory Visit: Payer: Self-pay | Admitting: Cardiology

## 2021-11-26 DIAGNOSIS — Z6824 Body mass index (BMI) 24.0-24.9, adult: Secondary | ICD-10-CM | POA: Diagnosis not present

## 2022-01-14 ENCOUNTER — Encounter (INDEPENDENT_AMBULATORY_CARE_PROVIDER_SITE_OTHER): Payer: Medicare Other | Admitting: Ophthalmology

## 2022-01-22 DIAGNOSIS — E7849 Other hyperlipidemia: Secondary | ICD-10-CM | POA: Diagnosis not present

## 2022-01-22 DIAGNOSIS — Z131 Encounter for screening for diabetes mellitus: Secondary | ICD-10-CM | POA: Diagnosis not present

## 2022-01-22 DIAGNOSIS — E876 Hypokalemia: Secondary | ICD-10-CM | POA: Diagnosis not present

## 2022-01-22 DIAGNOSIS — E782 Mixed hyperlipidemia: Secondary | ICD-10-CM | POA: Diagnosis not present

## 2022-01-22 DIAGNOSIS — E039 Hypothyroidism, unspecified: Secondary | ICD-10-CM | POA: Diagnosis not present

## 2022-01-26 ENCOUNTER — Telehealth: Payer: Self-pay | Admitting: Cardiology

## 2022-01-26 ENCOUNTER — Ambulatory Visit (INDEPENDENT_AMBULATORY_CARE_PROVIDER_SITE_OTHER): Payer: Medicare Other | Admitting: Ophthalmology

## 2022-01-26 ENCOUNTER — Encounter (INDEPENDENT_AMBULATORY_CARE_PROVIDER_SITE_OTHER): Payer: Self-pay | Admitting: Ophthalmology

## 2022-01-26 DIAGNOSIS — H43813 Vitreous degeneration, bilateral: Secondary | ICD-10-CM | POA: Diagnosis not present

## 2022-01-26 DIAGNOSIS — H353113 Nonexudative age-related macular degeneration, right eye, advanced atrophic without subfoveal involvement: Secondary | ICD-10-CM

## 2022-01-26 DIAGNOSIS — H353222 Exudative age-related macular degeneration, left eye, with inactive choroidal neovascularization: Secondary | ICD-10-CM | POA: Diagnosis not present

## 2022-01-26 DIAGNOSIS — H353124 Nonexudative age-related macular degeneration, left eye, advanced atrophic with subfoveal involvement: Secondary | ICD-10-CM | POA: Diagnosis not present

## 2022-01-26 NOTE — Assessment & Plan Note (Signed)
Subfoveal OS, accounts for acuity

## 2022-01-26 NOTE — Telephone Encounter (Signed)
Patient's daughter requesting appointment be virtual due to it being hard to get the patient in and out. Phone: [phone]

## 2022-01-26 NOTE — Assessment & Plan Note (Signed)
Stable OU, no holes or tears

## 2022-01-27 NOTE — Progress Notes (Signed)
Virtual Visit via Telephone Note   Because of Christine Pugh's co-morbid illnesses, she is at least at moderate risk for complications without adequate follow up.  This format is felt to be most appropriate for this patient at this time.  The patient did not have access to video technology/had technical difficulties with video requiring transitioning to audio format only (telephone).  All issues noted in this document were discussed and addressed.  No physical exam could be performed with this format.  Please refer to the patient's chart for her consent to telehealth for [facility].    Date:  01/29/2022   ID:  Christine Pugh, DOB 1922-07-28, MRN [id number] The patient was identified using 2 identifiers.  Patient Location: Home Provider Location: Office/Clinic   PCP:  Caryl Bis, MD   [facility] Providers Cardiologist:  Rozann Lesches, MD {  Evaluation Performed:  Follow-Up Visit  Chief Complaint: Cardiac follow-up  History of Present Illness:    Christine Pugh is a 88 y.o. female last assessed in December 2022.  We spoke by phone today, I also talked with her son with whom she lives.  She does not report any progressive sense of palpitations, no chest discomfort.  Does have intermittent leg edema.  No orthopnea or PND however.  She had recent lab work and schedule a visit to see Dr. Quillian Quince today.  I reviewed her medications.  Cardiac regimen includes amiodarone, Eliquis, and Toprol-XL.   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  s/p Left brachial, radial, ulnar, axillary embolectomy 08/2016.   Atypical atrial flutter (HCC)    Embolic stroke involving right cerebellar artery ([place])    Essential hypertension    Hypothyroidism    Ischemia of extremity    Memory loss 11/26/2016   Moderate to severe aortic stenosis    PAF  (paroxysmal atrial fibrillation) (HCC)    Paroxysmal atrial tachycardia ([place])    Stroke ([place]) 08/2016   Syncope    a. s/p ILR 01/2016.   Past Surgical History:  Procedure Laterality Date   APPENDECTOMY     ARTERY REPAIR Left 02/20/2020   Procedure: LEFT BRACHIAL ARTERY PSEUDOANEURYSM REPAIR WITH LEFT BRACHIAL TO ULNAR BYPASS USING VEIN;  Surgeon: Elam Dutch, MD;  Location: [place];  Service: Vascular;  Laterality: Left;   BREAST LUMPECTOMY     dental implant     EP IMPLANTABLE DEVICE N/A 01/10/2016   Procedure: Loop Recorder Insertion;  Surgeon: [name], MD;  Location: [place] CV [facility];  Service: Cardiovascular;  Laterality: N/A;   IR GENERIC HISTORICAL  08/23/2016   IR US GUIDE VASC ACCESS RIGHT 08/23/2016 Corrie Mckusick, DO [facility]   IR GENERIC HISTORICAL  08/23/2016   IR PERCUTANEOUS ART THROMBECTOMY/INFUSION INTRACRANIAL INC DIAG ANGIO 08/23/2016 Corrie Mckusick, DO [facility]   IR GENERIC HISTORICAL  08/23/2016   IR ANGIO INTRA EXTRACRAN SEL COM CAROTID INNOMINATE UNI L MOD SED 08/23/2016 Corrie Mckusick, DO [facility]   RADIOLOGY WITH ANESTHESIA Bilateral 08/23/2016   Procedure: RADIOLOGY WITH ANESTHESIA;  Surgeon: Medication Radiologist, MD;  Location: [place];  Service: Radiology;  Laterality: Bilateral;   THROMBECTOMY BRACHIAL ARTERY Left 08/23/2016   Procedure: Left Radial, Ulnar, Brachial and Axillary Embolectomy;  Surgeon: Elam Dutch, MD;  Location: [place];  Service: Vascular;  Laterality: Left;  THYROIDECTOMY     TONSILLECTOMY     TOTAL ABDOMINAL HYSTERECTOMY     VEIN HARVEST Right 02/20/2020   Procedure: RIGHT LEG GREATER SAPHENOUS VEIN HARVEST;  Surgeon: Elam Dutch, MD;  [facility];  Service: Vascular;  Laterality: Right;   VITRECTOMY AND CATARACT       Current Meds  Medication Sig   acetaminophen (TYLENOL) 325 MG suppository Place 1-2 suppositories (325-650 mg total) rectally every 6 (six) hours as needed (mild pain, headache, or temp >/= 101 F).    amiodarone (PACERONE) 100 MG tablet Take 100 mg by mouth daily.   apixaban (ELIQUIS) 2.5 MG TABS tablet Take 1 tablet (2.5 mg total) by mouth 2 (two) times daily.   HYDROcodone-acetaminophen (NORCO/VICODIN) 5-325 MG tablet Take 1 tablet by mouth every 6 (six) hours as needed for moderate pain.   levothyroxine (SYNTHROID, LEVOTHROID) 112 MCG tablet Take 1 tablet (112 mcg total) by mouth daily before breakfast.   Lutein-Zeaxanthin 25-5 MG CAPS Take 1 capsule by mouth 2 (two) times daily.   metoprolol succinate (TOPROL-XL) 25 MG 24 hr tablet TAKE 0.5 TABLETS BY MOUTH 2 TIMES DAILY.   Multiple Vitamin (MULTIVITAMIN) tablet Take 1 tablet by mouth daily.     triamcinolone cream (KENALOG) 0.1 % Apply 1 application topically daily as needed (irritated skin).    triamterene-hydrochlorothiazide (DYAZIDE) 37.5-25 MG capsule Take 1 each (1 capsule total) by mouth daily.   [DISCONTINUED] amiodarone (PACERONE) 200 MG tablet Take 1 tablet (200 mg total) by mouth daily.     Allergies:   Patient has no known allergies.   ROS:   Please see the history of present illness. All other systems reviewed and are negative.   Prior CV studies:   The following studies were reviewed today:  Echocardiogram 08/24/2016: Study Conclusions   - Left ventricle: The cavity size was normal. There was mild   concentric hypertrophy. Systolic function was vigorous. The   estimated ejection fraction was in the range of 65% to 70%. Wall   motion was normal; there were no regional wall motion   abnormalities. Features are consistent with a pseudonormal left   ventricular filling pattern, with concomitant abnormal relaxation   and increased filling pressure (grade 2 diastolic dysfunction).   Doppler parameters are consistent with elevated ventricular   end-diastolic filling pressure. - Aortic valve: There was moderate stenosis. There was mild   regurgitation. Mean gradient (S): 44 mm Hg. Peak gradient (S): 74   mm Hg.  Valve area (VTI): 0.64 cm^2. Valve area (Vmax): 0.59 cm^2.   Valve area (Vmean): 0.65 cm^2. - Aortic root: The aortic root was normal in size. - Mitral valve: There was mild regurgitation. - Left atrium: The atrium was moderately dilated. - Right ventricle: Systolic function was normal. - Tricuspid valve: There was mild regurgitation. - Pulmonary arteries: Systolic pressure was mildly increased. PA   peak pressure: 33 mm Hg (S). - Inferior vena cava: The vessel was normal in size. - Pericardium, extracardiac: There was no pericardial effusion.  Labs/Other Tests and Data Reviewed:    EKG:  An ECG dated 02/17/2020 was personally reviewed today and demonstrated:  Sinus rhythm with PAC, left anterior fascicular block, nonspecific T wave changes.  Recent Labs:  October 2022: BUN 12, creatinine 0.98, potassium 3.8, AST 19, ALT 10, TSH 4.05, cholesterol 255, triglycerides 113, HDL 89, LDL 147  Wt Readings from Last 3 Encounters:  01/29/22 125 lb (56.7 kg)  07/23/21 131 lb (59.4 kg)  09/12/20  139 lb (63 kg)     Objective:    Vital Signs:  BP 95/68   Pulse 88   Wt 125 lb (56.7 kg)   SpO2 98%   BMI 22.14 kg/m    ASSESSMENT & PLAN:    1.  Paroxysmal atrial fibrillation/flutter with CHA2DS2-VASc score of 7.  She remains on Eliquis for stroke prophylaxis, 2.5 mg twice daily dose.  Continue low-dose amiodarone and Toprol-XL.  Requesting recent lab work from Dr. Quillian Quince.  2.  Severe low gradient aortic stenosis, managed conservatively.  She declined invasive work-up.    Time:   Today, I have spent 6 minutes with the patient with telehealth technology discussing the above problems.     Medication Adjustments/Labs and Tests Ordered: Current medicines are reviewed at length with the patient today.  Concerns regarding medicines are outlined above.   Tests Ordered: No orders of the defined types were placed in this encounter.   Medication Changes: No orders of the defined types were  placed in this encounter.   Follow Up:  In Person  6 months.  Signed, Rozann Lesches, MD  01/29/2022 8:59 AM    [place]

## 2022-01-29 ENCOUNTER — Ambulatory Visit (INDEPENDENT_AMBULATORY_CARE_PROVIDER_SITE_OTHER): Payer: Medicare Other | Admitting: Cardiology

## 2022-01-29 ENCOUNTER — Encounter: Payer: Self-pay | Admitting: Cardiology

## 2022-01-29 ENCOUNTER — Other Ambulatory Visit: Payer: Self-pay | Admitting: *Deleted

## 2022-01-29 VITALS — BP 95/68 | HR 88 | Wt 125.0 lb

## 2022-01-29 DIAGNOSIS — I480 Paroxysmal atrial fibrillation: Secondary | ICD-10-CM | POA: Diagnosis not present

## 2022-01-29 DIAGNOSIS — E7849 Other hyperlipidemia: Secondary | ICD-10-CM | POA: Diagnosis not present

## 2022-01-29 DIAGNOSIS — I350 Nonrheumatic aortic (valve) stenosis: Secondary | ICD-10-CM | POA: Diagnosis not present

## 2022-01-29 DIAGNOSIS — D692 Other nonthrombocytopenic purpura: Secondary | ICD-10-CM | POA: Diagnosis not present

## 2022-01-29 DIAGNOSIS — H353290 Exudative age-related macular degeneration, unspecified eye, stage unspecified: Secondary | ICD-10-CM | POA: Diagnosis not present

## 2022-01-29 DIAGNOSIS — E039 Hypothyroidism, unspecified: Secondary | ICD-10-CM | POA: Diagnosis not present

## 2022-01-29 DIAGNOSIS — I700 Atherosclerosis of aorta: Secondary | ICD-10-CM | POA: Diagnosis not present

## 2022-01-29 DIAGNOSIS — N1831 Chronic kidney disease, stage 3a: Secondary | ICD-10-CM | POA: Diagnosis not present

## 2022-01-29 DIAGNOSIS — D6869 Other thrombophilia: Secondary | ICD-10-CM | POA: Diagnosis not present

## 2022-01-29 DIAGNOSIS — I482 Chronic atrial fibrillation, unspecified: Secondary | ICD-10-CM | POA: Diagnosis not present

## 2022-01-29 DIAGNOSIS — I10 Essential (primary) hypertension: Secondary | ICD-10-CM | POA: Diagnosis not present

## 2022-01-29 DIAGNOSIS — I8391 Asymptomatic varicose veins of right lower extremity: Secondary | ICD-10-CM | POA: Diagnosis not present

## 2022-01-29 MED ORDER — APIXABAN 2.5 MG PO TABS: 2.5000 mg | ORAL_TABLET | Freq: Two times a day (BID) | ORAL | 2 refills | Status: DC

## 2022-01-29 NOTE — Patient Instructions (Signed)

## 2022-01-29 NOTE — Progress Notes (Signed)
Patient Consent for Virtual Visit    Christine Pugh, you are scheduled for a virtual visit with your provider today.  Just as we do with appointments in the office, we must obtain your consent to participate.  Your consent will be active for this visit and any virtual visit you may have with one of our providers in the next 365 days.  If you have a MyChart account, I can also send a copy of this consent to you electronically.  All virtual visits are billed to your insurance company just like a traditional visit in the office.  As this is a virtual visit, video technology does not allow for your provider to perform a traditional examination.  This may limit your provider's ability to fully assess your condition.  If your provider identifies any concerns that need to be evaluated in person or the need to arrange testing such as labs, EKG, etc, we will make arrangements to do so.  Although advances in technology are sophisticated, we cannot ensure that it will always work on either your end or our end.  If the connection with a video visit is poor, we may have to switch to a telephone visit.  With either a video or telephone visit, we are not always able to ensure that we have a secure connection.   I need to obtain your verbal consent now.   Are you willing to proceed with your visit today?        :[id number]}     Christine Pugh has provided verbal consent on 01/29/2022 for a virtual visit (video or telephone).   CONSENT FOR VIRTUAL VISIT FOR:  Christine Pugh  By participating in this virtual visit I agree to the following:  I hereby voluntarily request, consent and authorize [facility] and its employed or contracted physicians, physician assistants, nurse practitioners or other licensed health care professionals (the Practitioner), to provide me with telemedicine health care services (the "Services") as deemed necessary by the treating Practitioner. I acknowledge and consent to receive the Services  by the Practitioner via telemedicine. I understand that the telemedicine visit will involve communicating with the Practitioner through live audiovisual communication technology and the disclosure of certain medical information by electronic transmission. I acknowledge that I have been given the opportunity to request an in-person assessment or other available alternative prior to the telemedicine visit and am voluntarily participating in the telemedicine visit.  I understand that I have the right to withhold or withdraw my consent to the use of telemedicine in the course of my care at any time, without affecting my right to future care or treatment, and that the Practitioner or I may terminate the telemedicine visit at any time. I understand that I have the right to inspect all information obtained and/or recorded in the course of the telemedicine visit and may receive copies of available information for a reasonable fee.  I understand that some of the potential risks of receiving the Services via telemedicine include:  Delay or interruption in medical evaluation due to technological equipment failure or disruption; Information transmitted may not be sufficient (e.g. poor resolution of images) to allow for appropriate medical decision making by the Practitioner; and/or  In rare instances, security protocols could fail, causing a breach of personal health information.  Furthermore, I acknowledge that it is my responsibility to provide information about my medical history, conditions and care that is complete and accurate to the best of my ability. I acknowledge that Practitioner's  advice, recommendations, and/or decision may be based on factors not within their control, such as incomplete or inaccurate data provided by me or distortions of diagnostic images or specimens that may result from electronic transmissions. I understand that the practice of medicine is not an exact science and that Practitioner makes  no warranties or guarantees regarding treatment outcomes. I acknowledge that a copy of this consent can be made available to me via my patient portal ([facility]), or I can request a printed copy by calling the office of [facility].    I understand that my insurance will be billed for this visit.   I have read or had this consent read to me. I understand the contents of this consent, which adequately explains the benefits and risks of the Services being provided via telemedicine.  I have been provided ample opportunity to ask questions regarding this consent and the Services and have had my questions answered to my satisfaction. I give my informed consent for the services to be provided through the use of telemedicine in my medical care

## 2022-01-29 NOTE — Progress Notes (Signed)
Contacted to check status of 3% out of pocket expense to receive eliquis is $631.97. Says she has not spent any at this time. Request refills be sent to [facility].  Eliquis refill sent.

## 2022-04-23 DIAGNOSIS — E876 Hypokalemia: Secondary | ICD-10-CM | POA: Diagnosis not present

## 2022-04-23 DIAGNOSIS — E782 Mixed hyperlipidemia: Secondary | ICD-10-CM | POA: Diagnosis not present

## 2022-04-23 DIAGNOSIS — N183 Chronic kidney disease, stage 3 unspecified: Secondary | ICD-10-CM | POA: Diagnosis not present

## 2022-04-23 DIAGNOSIS — Z0001 Encounter for general adult medical examination with abnormal findings: Secondary | ICD-10-CM | POA: Diagnosis not present

## 2022-04-23 DIAGNOSIS — E7849 Other hyperlipidemia: Secondary | ICD-10-CM | POA: Diagnosis not present

## 2022-04-25 ENCOUNTER — Other Ambulatory Visit: Payer: Self-pay | Admitting: Cardiology

## 2022-05-04 DIAGNOSIS — E7849 Other hyperlipidemia: Secondary | ICD-10-CM | POA: Diagnosis not present

## 2022-05-04 DIAGNOSIS — I8391 Asymptomatic varicose veins of right lower extremity: Secondary | ICD-10-CM | POA: Diagnosis not present

## 2022-05-04 DIAGNOSIS — D6869 Other thrombophilia: Secondary | ICD-10-CM | POA: Diagnosis not present

## 2022-05-04 DIAGNOSIS — Z23 Encounter for immunization: Secondary | ICD-10-CM | POA: Diagnosis not present

## 2022-05-04 DIAGNOSIS — E039 Hypothyroidism, unspecified: Secondary | ICD-10-CM | POA: Diagnosis not present

## 2022-05-04 DIAGNOSIS — I10 Essential (primary) hypertension: Secondary | ICD-10-CM | POA: Diagnosis not present

## 2022-05-04 DIAGNOSIS — N1831 Chronic kidney disease, stage 3a: Secondary | ICD-10-CM | POA: Diagnosis not present

## 2022-05-04 DIAGNOSIS — D692 Other nonthrombocytopenic purpura: Secondary | ICD-10-CM | POA: Diagnosis not present

## 2022-05-04 DIAGNOSIS — Z0001 Encounter for general adult medical examination with abnormal findings: Secondary | ICD-10-CM | POA: Diagnosis not present

## 2022-05-04 DIAGNOSIS — H353290 Exudative age-related macular degeneration, unspecified eye, stage unspecified: Secondary | ICD-10-CM | POA: Diagnosis not present

## 2022-05-04 DIAGNOSIS — I350 Nonrheumatic aortic (valve) stenosis: Secondary | ICD-10-CM | POA: Diagnosis not present

## 2022-05-04 DIAGNOSIS — I700 Atherosclerosis of aorta: Secondary | ICD-10-CM | POA: Diagnosis not present

## 2022-05-21 DIAGNOSIS — Z23 Encounter for immunization: Secondary | ICD-10-CM | POA: Diagnosis not present

## 2022-05-26 ENCOUNTER — Telehealth: Payer: Self-pay | Admitting: Cardiology

## 2022-05-26 NOTE — Telephone Encounter (Signed)
Care giver is wanting to know if 1 year f/u can be telehealth appt. Please advise.

## 2022-05-26 NOTE — Telephone Encounter (Signed)
  Patient Consent for Virtual Visit        Christine Pugh has provided verbal consent on 05/26/2022 for a virtual visit (video or telephone).   CONSENT FOR VIRTUAL VISIT FOR:  Christine Pugh  By participating in this virtual visit I agree to the following:  I hereby voluntarily request, consent and authorize [facility] and its employed or contracted physicians, physician assistants, nurse practitioners or other licensed health care professionals (the Practitioner), to provide me with telemedicine health care services (the "Services") as deemed necessary by the treating Practitioner. I acknowledge and consent to receive the Services by the Practitioner via telemedicine. I understand that the telemedicine visit will involve communicating with the Practitioner through live audiovisual communication technology and the disclosure of certain medical information by electronic transmission. I acknowledge that I have been given the opportunity to request an in-person assessment or other available alternative prior to the telemedicine visit and am voluntarily participating in the telemedicine visit.  I understand that I have the right to withhold or withdraw my consent to the use of telemedicine in the course of my care at any time, without affecting my right to future care or treatment, and that the Practitioner or I may terminate the telemedicine visit at any time. I understand that I have the right to inspect all information obtained and/or recorded in the course of the telemedicine visit and may receive copies of available information for a reasonable fee.  I understand that some of the potential risks of receiving the Services via telemedicine include:  Delay or interruption in medical evaluation due to technological equipment failure or disruption; Information transmitted may not be sufficient (e.g. poor resolution of images) to allow for appropriate medical decision making by the  Practitioner; and/or  In rare instances, security protocols could fail, causing a breach of personal health information.  Furthermore, I acknowledge that it is my responsibility to provide information about my medical history, conditions and care that is complete and accurate to the best of my ability. I acknowledge that Practitioner's advice, recommendations, and/or decision may be based on factors not within their control, such as incomplete or inaccurate data provided by me or distortions of diagnostic images or specimens that may result from electronic transmissions. I understand that the practice of medicine is not an exact science and that Practitioner makes no warranties or guarantees regarding treatment outcomes. I acknowledge that a copy of this consent can be made available to me via my patient portal ([facility]), or I can request a printed copy by calling the office of [facility].    I understand that my insurance will be billed for this visit.   I have read or had this consent read to me. I understand the contents of this consent, which adequately explains the benefits and risks of the Services being provided via telemedicine.  I have been provided ample opportunity to ask questions regarding this consent and the Services and have had my questions answered to my satisfaction. I give my informed consent for the services to be provided through the use of telemedicine in my medical care

## 2022-07-21 ENCOUNTER — Encounter: Payer: Self-pay | Admitting: Cardiology

## 2022-07-21 ENCOUNTER — Telehealth: Payer: Self-pay

## 2022-07-21 ENCOUNTER — Ambulatory Visit: Payer: Medicare Other | Attending: Cardiology | Admitting: Cardiology

## 2022-07-21 ENCOUNTER — Telehealth: Payer: Self-pay | Admitting: Cardiology

## 2022-07-21 VITALS — BP 111/93 | HR 72 | Ht 63.0 in | Wt 130.0 lb

## 2022-07-21 DIAGNOSIS — I480 Paroxysmal atrial fibrillation: Secondary | ICD-10-CM

## 2022-07-21 DIAGNOSIS — I350 Nonrheumatic aortic (valve) stenosis: Secondary | ICD-10-CM | POA: Diagnosis not present

## 2022-07-21 NOTE — Progress Notes (Signed)
Virtual Visit via Telephone Note   Because of Christine Pugh's co-morbid illnesses, she is at least at moderate risk for complications without adequate follow up.  This format is felt to be most appropriate for this patient at this time.  The patient did not have access to video technology/had technical difficulties with video requiring transitioning to audio format only (telephone).  All issues noted in this document were discussed and addressed.  No physical exam could be performed with this format.  Please refer to the patient's chart for her consent to telehealth for [facility].    Date:  07/21/2022   ID:  Christine Pugh, DOB 12-07-1921, MRN [id number] The patient was identified using 2 identifiers.  Patient Location: Home Provider Location: Office/Clinic   PCP:  Caryl Bis, [facility] Cardiologist:  Rozann Lesches, MD {  Evaluation Performed:  Follow-Up Visit  Chief Complaint:  Cardiac follow-up  History of Present Illness:    Christine Pugh is a 88 y.o. female last assessed back in June.  We spoke by phone today.  She was there with her caregiver.  She did not indicate any major concerns, no falls or bleeding problems on Eliquis.  She has seen Dr. Quillian Quince in the interim, I reviewed lab work from September.  She does not report any palpitations.  I reviewed her medications and she remains on low-dose amiodarone along with Toprol-XL.  Heart rate recorded in the 70s today.   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  s/p Left brachial, radial, ulnar, axillary embolectomy 08/2016.   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 s/p ILR 01/2016.   Past Surgical History:  Procedure Laterality Date   APPENDECTOMY     ARTERY REPAIR Left 02/20/2020   Procedure: LEFT BRACHIAL ARTERY PSEUDOANEURYSM REPAIR WITH LEFT BRACHIAL TO ULNAR BYPASS USING VEIN;  Surgeon: Elam Dutch, MD;  Location: [facility];  Service: Vascular;  Laterality: Left;   BREAST LUMPECTOMY     dental implant     EP IMPLANTABLE DEVICE N/A 01/10/2016   Procedure: Loop Recorder Insertion;  Surgeon: [name], MD;  Location: [facility];  Service: Cardiovascular;  Laterality: N/A;   IR GENERIC HISTORICAL  08/23/2016   IR US GUIDE VASC ACCESS RIGHT 08/23/2016 Corrie Mckusick, DO [facility]   IR GENERIC HISTORICAL  08/23/2016   IR PERCUTANEOUS ART THROMBECTOMY/INFUSION INTRACRANIAL INC DIAG ANGIO 08/23/2016 Corrie Mckusick, DO [facility]   IR GENERIC HISTORICAL  08/23/2016   IR ANGIO INTRA EXTRACRAN SEL COM CAROTID INNOMINATE UNI L MOD SED 08/23/2016 Corrie Mckusick, DO [facility]   RADIOLOGY WITH ANESTHESIA Bilateral 08/23/2016   Procedure: RADIOLOGY WITH ANESTHESIA;  Surgeon: Medication Radiologist, MD;  Location: [facility];  Service: Radiology;  Laterality: Bilateral;   THROMBECTOMY BRACHIAL ARTERY Left 08/23/2016   Procedure: Left Radial, Ulnar, Brachial and Axillary Embolectomy;  Surgeon: Elam Dutch, MD;  Location: [facility];  Service: Vascular;  Laterality: Left;   THYROIDECTOMY     TONSILLECTOMY     TOTAL ABDOMINAL HYSTERECTOMY     VEIN HARVEST Right 02/20/2020   Procedure: RIGHT LEG GREATER SAPHENOUS VEIN HARVEST;  Surgeon: Elam Dutch, MD;  Location: [facility];  Service: Vascular;  Laterality: Right;   VITRECTOMY AND CATARACT       Current Meds  Medication Sig   acetaminophen (TYLENOL) 325 MG suppository Place 1-2 suppositories (325-650 mg total) rectally every 6 (six) hours as needed (mild pain, headache, or temp >/= 101  F).   amiodarone (PACERONE) 200 MG tablet Take 100 mg by mouth daily.   apixaban (ELIQUIS) 2.5 MG TABS tablet Take 1 tablet (2.5 mg total) by mouth 2 (two) times daily.   HYDROcodone-acetaminophen (NORCO/VICODIN) 5-325 MG tablet Take 1 tablet by mouth every 6 (six) hours as needed for moderate pain.   levothyroxine (SYNTHROID) 125 MCG tablet Take 125 mcg by mouth every morning.   Lutein-Zeaxanthin 25-5 MG CAPS Take 1 capsule by mouth 2 (two) times daily.   metoprolol succinate (TOPROL-XL) 25 MG 24 hr tablet TAKE 1/2 TABLET BY MOUTH TWICE A DAY   Multiple Vitamin (MULTIVITAMIN) tablet Take 1 tablet by mouth daily.     triamcinolone cream (KENALOG) 0.1 % Apply 1 application topically daily as needed (irritated skin).    triamterene-hydrochlorothiazide (DYAZIDE) 37.5-25 MG capsule Take 1 each (1 capsule total) by mouth daily.     Allergies:   Patient has no known allergies.   ROS:   Please see the history of present illness. All other systems reviewed and are negative.   Prior CV studies:   The following studies were reviewed today:  Echocardiogram 08/24/2016: Study Conclusions   - Left ventricle: The cavity size was normal. There was mild   concentric hypertrophy. Systolic function was vigorous. The   estimated ejection fraction was in the range of 65% to 70%. Wall   motion was normal; there were no regional wall motion   abnormalities. Features are consistent with a pseudonormal left   ventricular filling pattern, with concomitant abnormal relaxation   and increased filling pressure (grade 2 diastolic dysfunction).   Doppler parameters are consistent with elevated ventricular   end-diastolic filling pressure. - Aortic valve: There was moderate stenosis. There was mild   regurgitation. Mean gradient (S): 44 mm Hg. Peak gradient (S): 74   mm Hg. Valve area (VTI): 0.64 cm^2. Valve area (Vmax): 0.59 cm^2.   Valve area (Vmean): 0.65 cm^2. - Aortic root: The aortic root was normal in  size. - Mitral valve: There was mild regurgitation. - Left atrium: The atrium was moderately dilated. - Right ventricle: Systolic function was normal. - Tricuspid valve: There was mild regurgitation. - Pulmonary arteries: Systolic pressure was mildly increased. PA   peak pressure: 33 mm Hg (S). - Inferior vena cava: The vessel was normal in size. - Pericardium, extracardiac: There was no pericardial effusion.  Labs/Other Tests and Data Reviewed:    EKG:  An ECG dated 02/17/2020 was personally reviewed today and demonstrated:  Sinus rhythm with PAC, left anterior fascicular block, nonspecific T wave changes.  Recent Labs:  September 2023: BUN 14, creatinine 0.77, potassium 4.4, AST 28, ALT 11, cholesterol 235, triglycerides 150, HDL 68, LDL 141  Wt Readings from Last 3 Encounters:  07/21/22 130 lb (59 kg)  01/29/22 125 lb (56.7 kg)  07/23/21 131 lb (59.4 kg)        Objective:    Vital Signs:  BP (!) 111/93   Pulse 72   Ht '5\' 3"'$  (1.6 m)   Wt 130 lb (59 kg)   BMI 23.03 kg/m    ASSESSMENT & PLAN:    1.  Paroxysmal atrial fibrillation/flutter with CHA2DS2-VASc score of 7.  She remains symptomatically stable without active palpitations on combination of low-dose amiodarone and Toprol-XL.  Continue Eliquis 2.5 mg twice daily.  No reported spontaneous bleeding problems or recent falls.  2.  Severe low-flow/low gradient aortic stenosis being followed conservatively after prior discussion.   Time:   Today, I have spent 6 minutes with the patient with telehealth technology discussing the above problems.     Medication Adjustments/Labs and Tests Ordered: Current medicines are reviewed at length with the patient today.  Concerns regarding medicines are outlined above.   Tests Ordered: No orders of the defined types were placed in this encounter.   Medication Changes: No orders of the defined types were placed in this encounter.   Follow Up:  Virtual Visit   6  months.  Signed, Rozann Lesches, MD  07/21/2022 11:02 AM    [place]

## 2022-07-21 NOTE — Telephone Encounter (Signed)
Patient returned call for 10:40 AM virtual appointment with Dr. Domenic Polite. RN unavailable for transfer.

## 2022-07-21 NOTE — Telephone Encounter (Signed)
  Patient Consent for Virtual Visit        Christine Pugh has provided verbal consent on 07/21/2022 for a virtual visit (video or telephone).   CONSENT FOR VIRTUAL VISIT FOR:  Christine Pugh  By participating in this virtual visit I agree to the following:  I hereby voluntarily request, consent and authorize [facility] and its employed or contracted physicians, physician assistants, nurse practitioners or other licensed health care professionals (the Practitioner), to provide me with telemedicine health care services (the "Services") as deemed necessary by the treating Practitioner. I acknowledge and consent to receive the Services by the Practitioner via telemedicine. I understand that the telemedicine visit will involve communicating with the Practitioner through live audiovisual communication technology and the disclosure of certain medical information by electronic transmission. I acknowledge that I have been given the opportunity to request an in-person assessment or other available alternative prior to the telemedicine visit and am voluntarily participating in the telemedicine visit.  I understand that I have the right to withhold or withdraw my consent to the use of telemedicine in the course of my care at any time, without affecting my right to future care or treatment, and that the Practitioner or I may terminate the telemedicine visit at any time. I understand that I have the right to inspect all information obtained and/or recorded in the course of the telemedicine visit and may receive copies of available information for a reasonable fee.  I understand that some of the potential risks of receiving the Services via telemedicine include:  Delay or interruption in medical evaluation due to technological equipment failure or disruption; Information transmitted may not be sufficient (e.g. poor resolution of images) to allow for appropriate medical decision making by the  Practitioner; and/or  In rare instances, security protocols could fail, causing a breach of personal health information.  Furthermore, I acknowledge that it is my responsibility to provide information about my medical history, conditions and care that is complete and accurate to the best of my ability. I acknowledge that Practitioner's advice, recommendations, and/or decision may be based on factors not within their control, such as incomplete or inaccurate data provided by me or distortions of diagnostic images or specimens that may result from electronic transmissions. I understand that the practice of medicine is not an exact science and that Practitioner makes no warranties or guarantees regarding treatment outcomes. I acknowledge that a copy of this consent can be made available to me via my patient portal ([facility]), or I can request a printed copy by calling the office of [facility].    I understand that my insurance will be billed for this visit.   I have read or had this consent read to me. I understand the contents of this consent, which adequately explains the benefits and risks of the Services being provided via telemedicine.  I have been provided ample opportunity to ask questions regarding this consent and the Services and have had my questions answered to my satisfaction. I give my informed consent for the services to be provided through the use of telemedicine in my medical care

## 2022-07-21 NOTE — Telephone Encounter (Signed)
VV completed

## 2022-07-21 NOTE — Patient Instructions (Signed)
Medication Instructions:  Your physician recommends that you continue on your current medications as directed. Please refer to the Current Medication list given to you today.   Labwork: None today  Testing/Procedures: None today  Follow-Up: 6 months, Dr.McDowell  Any Other Special Instructions Will Be Listed Below (If Applicable).  If you need a refill on your cardiac medications before your next appointment, please call your pharmacy.

## 2022-07-22 ENCOUNTER — Other Ambulatory Visit: Payer: Self-pay | Admitting: Cardiology

## 2022-08-20 DIAGNOSIS — R21 Rash and other nonspecific skin eruption: Secondary | ICD-10-CM | POA: Diagnosis not present

## 2022-08-20 DIAGNOSIS — L89309 Pressure ulcer of unspecified buttock, unspecified stage: Secondary | ICD-10-CM | POA: Diagnosis not present

## 2022-09-28 ENCOUNTER — Encounter (INDEPENDENT_AMBULATORY_CARE_PROVIDER_SITE_OTHER): Payer: Medicare Other | Admitting: Ophthalmology

## 2023-01-02 DEATH — deceased
